# Patient Record
Sex: Female | Born: 1935
Health system: Southern US, Community
[De-identification: ages and names within clinical notes are randomized; demographics above are authoritative.]

## PROBLEM LIST (undated history)

## (undated) DIAGNOSIS — Q6589 Other specified congenital deformities of hip: Secondary | ICD-10-CM

## (undated) DIAGNOSIS — G43909 Migraine, unspecified, not intractable, without status migrainosus: Secondary | ICD-10-CM

## (undated) DIAGNOSIS — I441 Atrioventricular block, second degree: Secondary | ICD-10-CM

## (undated) DIAGNOSIS — D649 Anemia, unspecified: Secondary | ICD-10-CM

## (undated) DIAGNOSIS — M1990 Unspecified osteoarthritis, unspecified site: Secondary | ICD-10-CM

## (undated) DIAGNOSIS — Z8679 Personal history of other diseases of the circulatory system: Secondary | ICD-10-CM

## (undated) DIAGNOSIS — D509 Iron deficiency anemia, unspecified: Secondary | ICD-10-CM

## (undated) DIAGNOSIS — A0472 Enterocolitis due to Clostridium difficile, not specified as recurrent: Secondary | ICD-10-CM

## (undated) DIAGNOSIS — M25551 Pain in right hip: Secondary | ICD-10-CM

## (undated) DIAGNOSIS — H547 Unspecified visual loss: Secondary | ICD-10-CM

## (undated) DIAGNOSIS — I4719 Other supraventricular tachycardia: Secondary | ICD-10-CM

## (undated) DIAGNOSIS — H409 Unspecified glaucoma: Secondary | ICD-10-CM

## (undated) DIAGNOSIS — B028 Zoster with other complications: Secondary | ICD-10-CM

## (undated) DIAGNOSIS — E785 Hyperlipidemia, unspecified: Secondary | ICD-10-CM

## (undated) DIAGNOSIS — H3530 Unspecified macular degeneration: Secondary | ICD-10-CM

## (undated) DIAGNOSIS — Z9289 Personal history of other medical treatment: Secondary | ICD-10-CM

## (undated) DIAGNOSIS — I493 Ventricular premature depolarization: Secondary | ICD-10-CM

## (undated) DIAGNOSIS — I491 Atrial premature depolarization: Secondary | ICD-10-CM

## (undated) DIAGNOSIS — T4145XA Adverse effect of unspecified anesthetic, initial encounter: Secondary | ICD-10-CM

## (undated) DIAGNOSIS — R51 Headache: Secondary | ICD-10-CM

## (undated) DIAGNOSIS — T8859XA Other complications of anesthesia, initial encounter: Secondary | ICD-10-CM

## (undated) HISTORY — PX: COLONOSCOPY: SHX174

## (undated) HISTORY — PX: BELPHAROPTOSIS REPAIR: SHX369

## (undated) HISTORY — DX: Zoster with other complications: B02.8

## (undated) HISTORY — DX: Atrial premature depolarization: I49.1

## (undated) HISTORY — PX: TONSILLECTOMY: SUR1361

## (undated) HISTORY — DX: Atrioventricular block, second degree: I44.1

## (undated) HISTORY — DX: Unspecified visual loss: H54.7

## (undated) HISTORY — DX: Unspecified osteoarthritis, unspecified site: M19.90

## (undated) HISTORY — PX: OTHER SURGICAL HISTORY: SHX169

## (undated) HISTORY — DX: Other supraventricular tachycardia: I47.19

## (undated) HISTORY — DX: Ventricular premature depolarization: I49.3

---

## 1977-08-01 HISTORY — PX: OTHER SURGICAL HISTORY: SHX169

## 1977-08-01 HISTORY — PX: APPENDECTOMY: SHX54

## 1998-04-22 ENCOUNTER — Other Ambulatory Visit: Admission: RE | Admit: 1998-04-22 | Discharge: 1998-04-22 | Payer: Self-pay | Admitting: *Deleted

## 1999-12-24 ENCOUNTER — Other Ambulatory Visit: Admission: RE | Admit: 1999-12-24 | Discharge: 1999-12-24 | Payer: Self-pay | Admitting: *Deleted

## 2008-08-18 LAB — HM PAP SMEAR

## 2009-07-22 ENCOUNTER — Encounter: Admission: RE | Admit: 2009-07-22 | Discharge: 2009-07-22 | Payer: Self-pay | Admitting: Internal Medicine

## 2010-09-18 LAB — HM DEXA SCAN

## 2011-02-08 ENCOUNTER — Other Ambulatory Visit: Payer: Self-pay | Admitting: Internal Medicine

## 2011-02-08 ENCOUNTER — Ambulatory Visit
Admission: RE | Admit: 2011-02-08 | Discharge: 2011-02-08 | Disposition: A | Discharge status: Home / Self Care | Payer: Medicare Other | Source: Ambulatory Visit | Attending: Internal Medicine | Admitting: Internal Medicine

## 2011-02-08 DIAGNOSIS — M25519 Pain in unspecified shoulder: Secondary | ICD-10-CM

## 2012-08-01 DIAGNOSIS — Z9289 Personal history of other medical treatment: Secondary | ICD-10-CM

## 2012-08-01 HISTORY — DX: Personal history of other medical treatment: Z92.89

## 2012-08-03 ENCOUNTER — Other Ambulatory Visit: Payer: Self-pay | Admitting: Orthopedic Surgery

## 2012-08-03 MED ORDER — BUPIVACAINE LIPOSOME 1.3 % IJ SUSP: 20.0000 mL | Freq: Once | INTRAMUSCULAR | Status: DC

## 2012-08-03 MED ORDER — DEXAMETHASONE SODIUM PHOSPHATE 10 MG/ML IJ SOLN: 10.0000 mg | Freq: Once | INTRAMUSCULAR | Status: DC

## 2012-08-03 NOTE — Progress Notes (Signed)
Preoperative surgical orders have been place into the Epic hospital system for [facility] on 08/03/2012, 7:57 AM  by Patrica Duel for surgery on 09/10/2012.  Preop Total Knee orders including Experal, IV Tylenol, and IV Decadron as long as there are no contraindications to the above medications. Avel Peace, PA-C

## 2012-08-29 NOTE — Patient Instructions (Signed)
Meredith Moore  08/29/2012   Your procedure is scheduled on:09/10/12   Report to [facility] at [phone] AM.  Call this number if you have problems the morning of surgery: [phone]   Remember:   Do not eat food or drink liquids after midnight.   Take these medicines the morning of surgery with A SIP OF WATER:    Do not wear jewelry, make-up or nail polish.  Do not wear lotions, powders, or perfumes.   Do not shave 48 hours prior to surgery.   Do not bring valuables to the hospital.  Contacts, dentures or bridgework may not be worn into surgery.  Leave suitcase in the car. After surgery it may be brought to your room.  For patients admitted to the hospital, checkout time is 11:00 AM the day of discharge.    SEE CHG INSTRUCTION SHEET    Please read over the following fact sheets that you were given: MRSA Information, Blood Transfusion Fact Sheet, coughing and deep breathing exercises, Incentive Spirometry Fact Sheet, leg exercises.                Failure to comply with these instructions may result in cancellation of your surgery.               Patient Signature _______________________             Nurse Signature _______________________

## 2012-08-30 ENCOUNTER — Encounter (HOSPITAL_COMMUNITY): Payer: Self-pay

## 2012-08-30 ENCOUNTER — Encounter (HOSPITAL_COMMUNITY)
Admission: RE | Admit: 2012-08-30 | Discharge: 2012-08-30 | Disposition: A | Discharge status: Home / Self Care | Payer: Medicare Other | Source: Ambulatory Visit | Attending: Orthopedic Surgery | Admitting: Orthopedic Surgery

## 2012-08-30 HISTORY — DX: Unspecified glaucoma: H40.9

## 2012-08-30 HISTORY — DX: Unspecified osteoarthritis, unspecified site: M19.90

## 2012-08-30 HISTORY — DX: Unspecified macular degeneration: H35.30

## 2012-08-30 LAB — COMPREHENSIVE METABOLIC PANEL
ALT: 13 U/L (ref 0–35)
AST: 18 U/L (ref 0–37)
Albumin: 3.7 g/dL (ref 3.5–5.2)
CO2: 27 mEq/L (ref 19–32)
Calcium: 9.7 mg/dL (ref 8.4–10.5)
Chloride: 100 mEq/L (ref 96–112)
GFR calc non Af Amer: 63 mL/min — ABNORMAL LOW (ref >90)
Sodium: 136 mEq/L (ref 135–145)
Total Bilirubin: 0.3 mg/dL (ref 0.3–1.2)

## 2012-08-30 LAB — URINALYSIS, ROUTINE W REFLEX MICROSCOPIC
Glucose, UA: NEGATIVE mg/dL
Ketones, ur: NEGATIVE mg/dL
Specific Gravity, Urine: 1.007 (ref 1.005–1.030)
pH: 7 (ref 5.0–8.0)

## 2012-08-30 LAB — CBC
Platelets: 272 10*3/uL (ref 150–400)
RBC: 4.17 MIL/uL (ref 3.87–5.11)
WBC: 6.5 10*3/uL (ref 4.0–10.5)

## 2012-08-30 LAB — SURGICAL PCR SCREEN: Staphylococcus aureus: NEGATIVE

## 2012-08-30 NOTE — Progress Notes (Signed)
Patient to bring own eye drops from home.

## 2012-08-30 NOTE — Progress Notes (Signed)
Urinalysis with micro results faxed to Dr Lequita Halt by [facility] fax.

## 2012-08-30 NOTE — Progress Notes (Signed)
EKG 08/08/12 on chart

## 2012-09-06 NOTE — H&P (Signed)
TOTAL KNEE ADMISSION H&P  Patient is being admitted for right total knee arthroplasty.  Subjective:  Chief Complaint:right knee pain.  HPI: Meredith Moore, 77 y.o. female, has a history of pain and functional disability in the right knee due to arthritis and has failed non-surgical conservative treatments for greater than 12 weeks to includeNSAID's and/or analgesics, corticosteriod injections, flexibility and strengthening excercises and activity modification.  Onset of symptoms was gradual, starting 5 years ago with stable course since that time. The patient noted no past surgery on the right knee(s).  Patient currently rates pain in the right knee(s) at 7 out of 10 with activity. Patient has night pain, worsening of pain with activity and weight bearing, pain that interferes with activities of daily living, pain with passive range of motion, crepitus and joint swelling.  Patient has evidence of subchondral cysts, periarticular osteophytes and joint space narrowing by imaging studies. There is no active infection.  Past Medical History  Diagnosis Date  . Arthritis   . Glaucoma   . Macular degeneration     Past Surgical History  Procedure Date  . Tonsillectomy     1945   . Ovarian tumor      benign tumor removed   . Appendectomy     No prescriptions prior to admission   Allergies  Allergen Reactions  . Formaldehyde   . Nickel     History  Substance Use Topics  . Smoking status: Former Smoker    Quit date: 08/01/1988  . Smokeless tobacco: Never Used  . Alcohol Use: No    Family History Father deceased age 57 due to CAD Mother deceased age 27 due to MI  Review of Systems  Constitutional: Negative.   HENT: Positive for tinnitus. Negative for hearing loss, ear pain, nosebleeds, congestion, sore throat, neck pain and ear discharge.   Eyes: Negative.   Respiratory: Negative for stridor.   Cardiovascular: Negative.   Genitourinary: Negative.   Musculoskeletal: Positive  for myalgias and joint pain. Negative for back pain and falls.       Right knee pain  Skin: Negative.   Neurological: Negative.  Negative for headaches.  Endo/Heme/Allergies: Negative.   Psychiatric/Behavioral: Negative.     Objective:  Physical Exam  Constitutional: She is oriented to person, place, and time. She appears well-developed and well-nourished.  HENT:  Head: Normocephalic and atraumatic.  Right Ear: External ear normal.  Left Ear: External ear normal.  Nose: Nose normal.  Mouth/Throat: Oropharynx is clear and moist.  Eyes: EOM are normal. Right eye exhibits no discharge. Left eye exhibits no discharge.  Neck: Normal range of motion. Neck supple. No tracheal deviation present. No thyromegaly present.  Cardiovascular: Normal rate, regular rhythm, normal heart sounds and intact distal pulses.   No murmur heard. Respiratory: Effort normal and breath sounds normal. No respiratory distress. She exhibits no tenderness.  GI: Soft. Bowel sounds are normal. She exhibits no distension and no mass. There is no tenderness.  Musculoskeletal:       Right hip: Normal.       Left hip: Normal.       Right lower leg: She exhibits no tenderness and no swelling.       Left lower leg: She exhibits no tenderness and no swelling.       Legs: Lymphadenopathy:    She has no cervical adenopathy.  Neurological: She is oriented to person, place, and time. She has normal strength and normal reflexes. No sensory deficit.  Skin: No  rash noted. No erythema.  Psychiatric: She has a normal mood and affect.    Vitals Weight: 138 lb Height: 64 in Body Surface Area: 1.68 m Body Mass Index: 23.69 kg/m BP: 118/68 (Sitting, Left Arm, Standard)  Imaging Review Plain radiographs demonstrate severe degenerative joint disease of the right knee(s). The overall alignment issignificant valgus. The bone quality appears to be fair for age and reported activity level.  Assessment/Plan:  End stage  arthritis, right knee   The patient history, physical examination, clinical judgment of the provider and imaging studies are consistent with end stage degenerative joint disease of the right knee(s) and total knee arthroplasty is deemed medically necessary. The treatment options including medical management, injection therapy arthroscopy and arthroplasty were discussed at length. The risks and benefits of total knee arthroplasty were presented and reviewed. The risks due to aseptic loosening, infection, stiffness, patella tracking problems, thromboembolic complications and other imponderables were discussed. The patient acknowledged the explanation, agreed to proceed with the plan and consent was signed. Patient is being admitted for inpatient treatment for surgery, pain control, PT, OT, prophylactic antibiotics, VTE prophylaxis, progressive ambulation and ADL's and discharge planning. The patient is planning to be discharged to skilled nursing facility     [place], [place]

## 2012-09-10 ENCOUNTER — Encounter (HOSPITAL_COMMUNITY)
Admission: RE | Disposition: A | Discharge status: Skilled Nursing Facility | Payer: Self-pay | Source: Ambulatory Visit | Attending: Orthopedic Surgery

## 2012-09-10 ENCOUNTER — Inpatient Hospital Stay (HOSPITAL_COMMUNITY): Payer: Medicare Other | Admitting: Anesthesiology

## 2012-09-10 ENCOUNTER — Inpatient Hospital Stay (HOSPITAL_COMMUNITY)
Admission: RE | Admit: 2012-09-10 | Discharge: 2012-09-14 | DRG: 470 | Disposition: A | Discharge status: Skilled Nursing Facility | Payer: Medicare Other | Source: Ambulatory Visit | Attending: Orthopedic Surgery | Admitting: Orthopedic Surgery

## 2012-09-10 ENCOUNTER — Encounter (HOSPITAL_COMMUNITY): Payer: Self-pay | Admitting: Anesthesiology

## 2012-09-10 ENCOUNTER — Encounter (HOSPITAL_COMMUNITY): Payer: Self-pay | Admitting: *Deleted

## 2012-09-10 DIAGNOSIS — D62 Acute posthemorrhagic anemia: Secondary | ICD-10-CM | POA: Diagnosis not present

## 2012-09-10 DIAGNOSIS — Z01812 Encounter for preprocedural laboratory examination: Secondary | ICD-10-CM

## 2012-09-10 DIAGNOSIS — M1710 Unilateral primary osteoarthritis, unspecified knee: Secondary | ICD-10-CM | POA: Diagnosis present

## 2012-09-10 HISTORY — PX: TOTAL KNEE ARTHROPLASTY: SHX125

## 2012-09-10 SURGERY — ARTHROPLASTY, KNEE, TOTAL
Anesthesia: Spinal | Site: Knee | Laterality: Right | Wound class: Clean

## 2012-09-10 MED ORDER — DEXAMETHASONE SODIUM PHOSPHATE 10 MG/ML IJ SOLN
INTRAMUSCULAR | Status: DC | PRN
  Administered 2012-09-10: 10 mg via INTRAVENOUS

## 2012-09-10 MED ORDER — ACETAMINOPHEN 10 MG/ML IV SOLN
1000.0000 mg | Freq: Four times a day (QID) | INTRAVENOUS | Status: AC
  Administered 2012-09-10 – 2012-09-11 (×4): 1000 mg via INTRAVENOUS
  Filled 2012-09-10 (×7): qty 100

## 2012-09-10 MED ORDER — ACETAMINOPHEN 10 MG/ML IV SOLN
INTRAVENOUS | Status: DC | PRN
  Administered 2012-09-10: 1000 mg via INTRAVENOUS

## 2012-09-10 MED ORDER — METOCLOPRAMIDE HCL 10 MG PO TABS: 5.0000 mg | ORAL_TABLET | Freq: Three times a day (TID) | ORAL | Status: DC | PRN

## 2012-09-10 MED ORDER — ACETAMINOPHEN 650 MG RE SUPP: 650.0000 mg | Freq: Four times a day (QID) | RECTAL | Status: DC | PRN

## 2012-09-10 MED ORDER — LACTATED RINGERS IV SOLN
INTRAVENOUS | Status: DC
  Administered 2012-09-10 (×3): via INTRAVENOUS

## 2012-09-10 MED ORDER — ACETAMINOPHEN 325 MG PO TABS: 650.0000 mg | ORAL_TABLET | Freq: Four times a day (QID) | ORAL | Status: DC | PRN

## 2012-09-10 MED ORDER — PROPOFOL 10 MG/ML IV EMUL
INTRAVENOUS | Status: DC | PRN
  Administered 2012-09-10: 100 ug/kg/min via INTRAVENOUS

## 2012-09-10 MED ORDER — DEXTROSE-NACL 5-0.9 % IV SOLN
INTRAVENOUS | Status: DC
  Administered 2012-09-10 – 2012-09-12 (×3): via INTRAVENOUS

## 2012-09-10 MED ORDER — BUPIVACAINE ON-Q PAIN PUMP (FOR ORDER SET NO CHG)
INJECTION | Status: DC
  Filled 2012-09-10: qty 1

## 2012-09-10 MED ORDER — ONDANSETRON HCL 4 MG PO TABS: 4.0000 mg | ORAL_TABLET | Freq: Four times a day (QID) | ORAL | Status: DC | PRN

## 2012-09-10 MED ORDER — TIMOLOL MALEATE 0.5 % OP SOLN
1.0000 [drp] | Freq: Two times a day (BID) | OPHTHALMIC | Status: DC
  Administered 2012-09-10 – 2012-09-14 (×8): 1 [drp] via OPHTHALMIC
  Filled 2012-09-10: qty 5

## 2012-09-10 MED ORDER — MEPERIDINE HCL 50 MG/ML IJ SOLN: 6.2500 mg | INTRAMUSCULAR | Status: DC | PRN

## 2012-09-10 MED ORDER — KETOROLAC TROMETHAMINE 0.5 % OP SOLN
1.0000 [drp] | Freq: Three times a day (TID) | OPHTHALMIC | Status: DC
  Administered 2012-09-10 – 2012-09-14 (×11): 1 [drp] via OPHTHALMIC
  Filled 2012-09-10: qty 3

## 2012-09-10 MED ORDER — METOCLOPRAMIDE HCL 5 MG/ML IJ SOLN: 5.0000 mg | Freq: Three times a day (TID) | INTRAMUSCULAR | Status: DC | PRN

## 2012-09-10 MED ORDER — LACTATED RINGERS IV SOLN: INTRAVENOUS | Status: DC

## 2012-09-10 MED ORDER — PROMETHAZINE HCL 25 MG/ML IJ SOLN: 6.2500 mg | INTRAMUSCULAR | Status: DC | PRN

## 2012-09-10 MED ORDER — OXYCODONE HCL 5 MG PO TABS
5.0000 mg | ORAL_TABLET | ORAL | Status: DC | PRN
  Administered 2012-09-10: 5 mg via ORAL
  Administered 2012-09-10: 10 mg via ORAL
  Administered 2012-09-10: 5 mg via ORAL
  Administered 2012-09-11 (×3): 10 mg via ORAL
  Administered 2012-09-12: 5 mg via ORAL
  Administered 2012-09-12: 10 mg via ORAL
  Administered 2012-09-12 – 2012-09-13 (×3): 5 mg via ORAL
  Administered 2012-09-13 – 2012-09-14 (×2): 10 mg via ORAL
  Filled 2012-09-10 (×2): qty 1
  Filled 2012-09-10: qty 2
  Filled 2012-09-10: qty 1
  Filled 2012-09-10: qty 2
  Filled 2012-09-10: qty 1
  Filled 2012-09-10: qty 2
  Filled 2012-09-10: qty 1
  Filled 2012-09-10 (×4): qty 2
  Filled 2012-09-10: qty 1

## 2012-09-10 MED ORDER — ATORVASTATIN CALCIUM 10 MG PO TABS
5.0000 mg | ORAL_TABLET | Freq: Every day | ORAL | Status: DC
  Administered 2012-09-10 – 2012-09-14 (×5): 5 mg via ORAL
  Filled 2012-09-10 (×6): qty 0.5

## 2012-09-10 MED ORDER — DEXAMETHASONE 6 MG PO TABS
10.0000 mg | ORAL_TABLET | Freq: Once | ORAL | Status: AC
  Administered 2012-09-11: 10 mg via ORAL
  Filled 2012-09-10: qty 1

## 2012-09-10 MED ORDER — SODIUM CHLORIDE 0.9 % IR SOLN
Status: DC | PRN
  Administered 2012-09-10: 1000 mL

## 2012-09-10 MED ORDER — ACETAMINOPHEN 10 MG/ML IV SOLN: 1000.0000 mg | Freq: Once | INTRAVENOUS | Status: DC

## 2012-09-10 MED ORDER — CHLORHEXIDINE GLUCONATE 4 % EX LIQD
60.0000 mL | Freq: Once | CUTANEOUS | Status: DC
  Filled 2012-09-10: qty 60

## 2012-09-10 MED ORDER — PHENYLEPHRINE HCL 10 MG/ML IJ SOLN
10.0000 mg | INTRAVENOUS | Status: DC | PRN
  Administered 2012-09-10: 20 ug/min via INTRAVENOUS

## 2012-09-10 MED ORDER — MENTHOL 3 MG MT LOZG
1.0000 | LOZENGE | OROMUCOSAL | Status: DC | PRN
  Filled 2012-09-10: qty 9

## 2012-09-10 MED ORDER — ONDANSETRON HCL 4 MG/2ML IJ SOLN
4.0000 mg | Freq: Four times a day (QID) | INTRAMUSCULAR | Status: DC | PRN
  Administered 2012-09-11: 4 mg via INTRAVENOUS
  Filled 2012-09-10: qty 2

## 2012-09-10 MED ORDER — BUPIVACAINE LIPOSOME 1.3 % IJ SUSP
20.0000 mL | Freq: Once | INTRAMUSCULAR | Status: AC
  Administered 2012-09-10: 20 mL
  Filled 2012-09-10: qty 20

## 2012-09-10 MED ORDER — LATANOPROST 0.005 % OP SOLN
1.0000 [drp] | Freq: Every day | OPHTHALMIC | Status: DC
  Administered 2012-09-10 – 2012-09-13 (×4): 1 [drp] via OPHTHALMIC
  Filled 2012-09-10 (×2): qty 2.5

## 2012-09-10 MED ORDER — BUPIVACAINE IN DEXTROSE 0.75-8.25 % IT SOLN
INTRATHECAL | Status: DC | PRN
  Administered 2012-09-10: 1.7 mL via INTRATHECAL

## 2012-09-10 MED ORDER — METHOCARBAMOL 100 MG/ML IJ SOLN
500.0000 mg | Freq: Four times a day (QID) | INTRAMUSCULAR | Status: DC | PRN
  Administered 2012-09-10: 500 mg via INTRAVENOUS
  Filled 2012-09-10: qty 5

## 2012-09-10 MED ORDER — MIDAZOLAM HCL 5 MG/5ML IJ SOLN
INTRAMUSCULAR | Status: DC | PRN
  Administered 2012-09-10 (×2): 1 mg via INTRAVENOUS

## 2012-09-10 MED ORDER — METHOCARBAMOL 500 MG PO TABS
500.0000 mg | ORAL_TABLET | Freq: Four times a day (QID) | ORAL | Status: DC | PRN
  Administered 2012-09-10 – 2012-09-13 (×4): 500 mg via ORAL
  Filled 2012-09-10 (×4): qty 1

## 2012-09-10 MED ORDER — 0.9 % SODIUM CHLORIDE (POUR BTL) OPTIME
TOPICAL | Status: DC | PRN
  Administered 2012-09-10: 1000 mL

## 2012-09-10 MED ORDER — POLYETHYLENE GLYCOL 3350 17 G PO PACK
17.0000 g | PACK | Freq: Every day | ORAL | Status: DC | PRN
  Administered 2012-09-12: 17 g via ORAL

## 2012-09-10 MED ORDER — SODIUM CHLORIDE 0.9 % IV SOLN: INTRAVENOUS | Status: DC

## 2012-09-10 MED ORDER — HYDROMORPHONE HCL PF 1 MG/ML IJ SOLN: 0.2500 mg | INTRAMUSCULAR | Status: DC | PRN

## 2012-09-10 MED ORDER — TRAMADOL HCL 50 MG PO TABS: 50.0000 mg | ORAL_TABLET | Freq: Four times a day (QID) | ORAL | Status: DC | PRN

## 2012-09-10 MED ORDER — PHENYLEPHRINE HCL 10 MG/ML IJ SOLN
INTRAMUSCULAR | Status: DC | PRN
  Administered 2012-09-10: 80 ug via INTRAVENOUS
  Administered 2012-09-10: 40 ug via INTRAVENOUS

## 2012-09-10 MED ORDER — DEXAMETHASONE SODIUM PHOSPHATE 10 MG/ML IJ SOLN: 10.0000 mg | Freq: Once | INTRAMUSCULAR | Status: AC

## 2012-09-10 MED ORDER — BISACODYL 10 MG RE SUPP: 10.0000 mg | Freq: Every day | RECTAL | Status: DC | PRN

## 2012-09-10 MED ORDER — PHENOL 1.4 % MT LIQD
1.0000 | OROMUCOSAL | Status: DC | PRN
  Filled 2012-09-10: qty 177

## 2012-09-10 MED ORDER — DIPHENHYDRAMINE HCL 12.5 MG/5ML PO ELIX: 12.5000 mg | ORAL_SOLUTION | ORAL | Status: DC | PRN

## 2012-09-10 MED ORDER — RIVAROXABAN 10 MG PO TABS
10.0000 mg | ORAL_TABLET | Freq: Every day | ORAL | Status: DC
  Administered 2012-09-11 – 2012-09-14 (×4): 10 mg via ORAL
  Filled 2012-09-10 (×6): qty 1

## 2012-09-10 MED ORDER — CEFAZOLIN SODIUM 1-5 GM-% IV SOLN
1.0000 g | Freq: Four times a day (QID) | INTRAVENOUS | Status: AC
  Administered 2012-09-10 (×2): 1 g via INTRAVENOUS
  Filled 2012-09-10 (×2): qty 50

## 2012-09-10 MED ORDER — FLEET ENEMA 7-19 GM/118ML RE ENEM: 1.0000 | ENEMA | Freq: Once | RECTAL | Status: AC | PRN

## 2012-09-10 MED ORDER — PROPOFOL 10 MG/ML IV BOLUS
INTRAVENOUS | Status: DC | PRN
  Administered 2012-09-10: 50 mg via INTRAVENOUS

## 2012-09-10 MED ORDER — SODIUM CHLORIDE 0.9 % IJ SOLN
INTRAMUSCULAR | Status: DC | PRN
  Administered 2012-09-10: 50 mL

## 2012-09-10 MED ORDER — MORPHINE SULFATE 2 MG/ML IJ SOLN
1.0000 mg | INTRAMUSCULAR | Status: DC | PRN
  Filled 2012-09-10: qty 1

## 2012-09-10 MED ORDER — METOCLOPRAMIDE HCL 5 MG/ML IJ SOLN
INTRAMUSCULAR | Status: DC | PRN
  Administered 2012-09-10: 10 mg via INTRAVENOUS

## 2012-09-10 MED ORDER — CEFAZOLIN SODIUM-DEXTROSE 2-3 GM-% IV SOLR
2.0000 g | INTRAVENOUS | Status: AC
  Administered 2012-09-10: 2 g via INTRAVENOUS

## 2012-09-10 MED ORDER — DOCUSATE SODIUM 100 MG PO CAPS
100.0000 mg | ORAL_CAPSULE | Freq: Two times a day (BID) | ORAL | Status: DC
  Administered 2012-09-10 – 2012-09-14 (×9): 100 mg via ORAL

## 2012-09-10 MED ORDER — ONDANSETRON HCL 4 MG/2ML IJ SOLN
INTRAMUSCULAR | Status: DC | PRN
  Administered 2012-09-10 (×2): 2 mg via INTRAVENOUS

## 2012-09-10 SURGICAL SUPPLY — 53 items
BAG ZIPLOCK 12X15 (MISCELLANEOUS) ×2 IMPLANT
BANDAGE ELASTIC 6 VELCRO ST LF (GAUZE/BANDAGES/DRESSINGS) ×2 IMPLANT
BANDAGE ESMARK 6X9 LF (GAUZE/BANDAGES/DRESSINGS) ×1 IMPLANT
BLADE SAG 18X100X1.27 (BLADE) ×2 IMPLANT
BLADE SAW SGTL 11.0X1.19X90.0M (BLADE) ×2 IMPLANT
BNDG CMPR 9X6 STRL LF SNTH (GAUZE/BANDAGES/DRESSINGS) ×1
BNDG ESMARK 6X9 LF (GAUZE/BANDAGES/DRESSINGS) ×2
BOWL SMART MIX CTS (DISPOSABLE) ×2 IMPLANT
CEMENT HV SMART SET (Cement) ×4 IMPLANT
CLOTH BEACON ORANGE TIMEOUT ST (SAFETY) ×2 IMPLANT
CUFF TOURN SGL QUICK 34 (TOURNIQUET CUFF) ×2
CUFF TRNQT CYL 34X4X40X1 (TOURNIQUET CUFF) ×1 IMPLANT
DECANTER SPIKE VIAL GLASS SM (MISCELLANEOUS) ×2 IMPLANT
DRAPE EXTREMITY T 121X128X90 (DRAPE) ×2 IMPLANT
DRAPE POUCH INSTRU U-SHP 10X18 (DRAPES) ×2 IMPLANT
DRAPE U-SHAPE 47X51 STRL (DRAPES) ×2 IMPLANT
DRSG ADAPTIC 3X8 NADH LF (GAUZE/BANDAGES/DRESSINGS) ×2 IMPLANT
DRSG PAD ABDOMINAL 8X10 ST (GAUZE/BANDAGES/DRESSINGS) ×2 IMPLANT
DURAPREP 26ML APPLICATOR (WOUND CARE) ×2 IMPLANT
ELECT REM PT RETURN 9FT ADLT (ELECTROSURGICAL) ×2
ELECTRODE REM PT RTRN 9FT ADLT (ELECTROSURGICAL) ×1 IMPLANT
EVACUATOR 1/8 PVC DRAIN (DRAIN) ×2 IMPLANT
FACESHIELD LNG OPTICON STERILE (SAFETY) ×10 IMPLANT
GLOVE BIO SURGEON STRL SZ8 (GLOVE) ×2 IMPLANT
GLOVE BIOGEL PI IND STRL 8 (GLOVE) ×1 IMPLANT
GLOVE BIOGEL PI INDICATOR 8 (GLOVE) ×1
GLOVE SURG SS PI 6.5 STRL IVOR (GLOVE) ×4 IMPLANT
GOWN STRL NON-REIN LRG LVL3 (GOWN DISPOSABLE) ×6 IMPLANT
GOWN STRL REIN XL XLG (GOWN DISPOSABLE) ×2 IMPLANT
HANDPIECE INTERPULSE COAX TIP (DISPOSABLE) ×1
IMMOBILIZER KNEE 20 (SOFTGOODS) ×2
IMMOBILIZER KNEE 20 THIGH 36 (SOFTGOODS) ×1 IMPLANT
KIT BASIN OR (CUSTOM PROCEDURE TRAY) ×2 IMPLANT
MANIFOLD NEPTUNE II (INSTRUMENTS) ×2 IMPLANT
NDL SAFETY ECLIPSE 18X1.5 (NEEDLE) ×1 IMPLANT
NEEDLE HYPO 18GX1.5 SHARP (NEEDLE) ×1
NS IRRIG 1000ML POUR BTL (IV SOLUTION) ×2 IMPLANT
PACK TOTAL JOINT (CUSTOM PROCEDURE TRAY) ×2 IMPLANT
PADDING CAST COTTON 6X4 STRL (CAST SUPPLIES) ×6 IMPLANT
POSITIONER SURGICAL ARM (MISCELLANEOUS) ×2 IMPLANT
SET HNDPC FAN SPRY TIP SCT (DISPOSABLE) ×1 IMPLANT
SPONGE GAUZE 4X4 12PLY (GAUZE/BANDAGES/DRESSINGS) ×2 IMPLANT
STRIP CLOSURE SKIN 1/2X4 (GAUZE/BANDAGES/DRESSINGS) ×4 IMPLANT
SUCTION FRAZIER 12FR DISP (SUCTIONS) ×2 IMPLANT
SUT MNCRL AB 4-0 PS2 18 (SUTURE) ×2 IMPLANT
SUT VIC AB 2-0 CT1 27 (SUTURE) ×6
SUT VIC AB 2-0 CT1 TAPERPNT 27 (SUTURE) ×3 IMPLANT
SUT VLOC 180 0 24IN GS25 (SUTURE) ×2 IMPLANT
SYR 50ML LL SCALE MARK (SYRINGE) ×2 IMPLANT
TOWEL OR 17X26 10 PK STRL BLUE (TOWEL DISPOSABLE) ×4 IMPLANT
TRAY FOLEY CATH 14FRSI W/METER (CATHETERS) ×2 IMPLANT
WATER STERILE IRR 1500ML POUR (IV SOLUTION) ×4 IMPLANT
WRAP KNEE MAXI GEL POST OP (GAUZE/BANDAGES/DRESSINGS) ×2 IMPLANT

## 2012-09-10 NOTE — Op Note (Signed)
Pre-operative diagnosis- Osteoarthritis  Right knee(s)  Post-operative diagnosis- Osteoarthritis Right knee(s)  Procedure-  Right  Total Knee Arthroplasty  Surgeon- Meredith Rankin. Zahi Plaskett, MD  Assistant- Renee Harder, PA-C   Anesthesia-  Spinal EBL-* No blood loss amount entered *  Drains Hemovac  Tourniquet time-  Total Tourniquet Time Documented: Thigh (Right) - 55 minutes Total: Thigh (Right) - 55 minutes    Complications- None  Condition-PACU - hemodynamically stable.   Brief Clinical Note  Meredith Moore is a 77 y.o. year old female with end stage OA of her right knee with progressively worsening pain and dysfunction. She has constant pain, with activity and at rest and significant functional deficits with difficulties even with ADLs. She has had extensive non-op management including analgesics, injections of cortisone and home exercise program, but remains in significant pain with significant dysfunction.Radiographs show bone on bone arthritis lateral and patellofemoral. She presents now for right Total Knee Arthroplasty. She has a nickel allergy and we thus are utilizing the [employer] oxinium knee   Procedure in detail---   The patient is brought into the operating room and positioned supine on the operating table. After successful administration of  Spinal,   a tourniquet is placed high on the  Right thigh(s) and the lower extremity is prepped and draped in the usual sterile fashion. Time out is performed by the operating team and then the  Right lower extremity is wrapped in Esmarch, knee flexed and the tourniquet inflated to 300 mmHg.       A midline incision is made with a ten blade through the subcutaneous tissue to the level of the extensor mechanism. A fresh blade is used to make a medial parapatellar arthrotomy. Soft tissue over the proximal medial tibia is subperiosteally elevated to the joint line with a knife and into the semimembranosus bursa with a Cobb elevator.  Soft tissue over the proximal lateral tibia is elevated with attention being paid to avoiding the patellar tendon on the tibial tubercle. The patella is everted, knee flexed 90 degrees and the ACL and PCL are removed. Findings are bone on bone lateral and patellofemoral with large global osteophytes.        The drill is used to create a starting hole in the distal femur and the canal is thoroughly irrigated with sterile saline to remove the fatty contents. The 5 degree Right  valgus alignment guide is placed into the femoral canal and the distal femoral cutting block is pinned to remove 9.5 mm off the distal femur. Resection is made with an oscillating saw.      The tibia is subluxed forward and the menisci are removed. The extramedullary alignment guide is placed referencing proximally at the medial aspect of the tibial tubercle and distally along the second metatarsal axis and tibial crest. The block is pinned to remove 2mm off the more deficient lateral  side. Resection is made with an oscillating saw. Size 3 is the most appropriate size for the tibia and the proximal tibia is prepared with the modular drill and keel punch for that size.      The femoral sizing guide is placed and size 3 is most appropriate. Rotation is marked off the epicondylar axis and confirmed by creating a rectangular flexion gap at 90 degrees. The size 3 cutting block is pinned in this rotation and the anterior, posterior and chamfer cuts are made with the oscillating saw. The intercondylar block is then placed and the machining is done for  that.      Trial size 3 tibial component, trial size 3 posterior stabilized femur and a 15  mm posterior stabilized insert trial is placed. Full extension is achieved with excellent varus/valgus and anterior/posterior balance throughout full range of motion. The patella is everted and thickness measured to be 22  mm. Free hand resection is taken to 13 mm, a 32 template is placed, lug holes are  drilled, trial patella is placed, and it tracks normally. Osteophytes are removed off the posterior femur with the trial in place. All trials are removed and the cut bone surfaces prepared with pulsatile lavage. Cement is mixed and once ready for implantation, the size 3 tibial implant, size  3 posterior stabilized femoral component, and the size 32 patella are cemented in place and the patella is held with the clamp. The trial insert is placed and the knee held in full extension. The Exparel (20 ml mixed with 50 ml saline) is injected into the extensor mechanism, posterior capsule, medial and lateral gutters and subcutaneous tissues.  All extruded cement is removed and once the cement is hard the permanent 15 mm posterior stabilized insert is placed into the tibial tray.      The wound is copiously irrigated with saline solution and the extensor mechanism closed over a hemovac drain with #1 PDS suture. The tourniquet is released for a total tourniquet time of 55  minutes. Flexion against gravity is 140 degrees and the patella tracks normally. Subcutaneous tissue is closed with 2.0 vicryl and subcuticular with running 4.0 Monocryl. The incision is cleaned and dried and steri-strips and a bulky sterile dressing are applied. The limb is placed into a knee immobilizer and the patient is awakened and transported to recovery in stable condition.      Please note that a surgical assistant was a medical necessity for this procedure in order to perform it in a safe and expeditious manner. Surgical assistant was necessary to retract the ligaments and vital neurovascular structures to prevent injury to them and also necessary for proper positioning of the limb to allow for anatomic placement of the prosthesis.   Meredith Rankin Chelan Heringer, MD    09/10/2012, 10:45 AM

## 2012-09-10 NOTE — Preoperative (Addendum)
Beta Blockers   Reason not to administer Beta Blockers:Not Applicable, not on home BB 

## 2012-09-10 NOTE — Progress Notes (Addendum)
Clinical Social Work Department BRIEF PSYCHOSOCIAL ASSESSMENT 09/10/2012  Patient:  Meredith Moore, Meredith Moore     Account Number:  [id number]     Admit date:  09/10/2012  Clinical Social Worker:  Jacelyn Grip  Date/Time:  09/10/2012 03:14 PM  Referred by:  Physician  Date Referred:  09/10/2012 Referred for  SNF Placement   Other Referral:   Interview type:  Patient Other interview type:    PSYCHOSOCIAL DATA Living Status:  FAMILY Admitted from facility:   Level of care:   Primary support name:  Charles Meineke/spouse Primary support relationship to patient:  SPOUSE Degree of support available:   adequate    CURRENT CONCERNS Current Concerns  Post-Acute Placement   Other Concerns:    SOCIAL WORK ASSESSMENT / PLAN CSW received referral for New SNF.    CSW met with pt at beside re: SNF placement.    Pt was aware of referral and is interested in [facility] for rehab placment.    CSW completed FL2 and placed on chart, submitted and received pasarr, and faxed clinicals to [facility].    CSW to follow up with pt in regard to bed availability at [facility].    CSW to facilitate pt discharge needs when pt medically ready for discharge.   Assessment/plan status:  Psychosocial Support/Ongoing Assessment of Needs Other assessment/ plan:   discharge planning   Information/referral to community resources:   Referral to [facility]    PATIENT'S/FAMILY'S RESPONSE TO PLAN OF CARE: Pt alert and oriented x 4. Pt visited [facility] prior to admission and is eager to go to rehab at [facility]. Pt appreciative of CSW visit and assistance.     Jacklynn Lewis, MSW, LCSWA (coverage) Clinical Social Work

## 2012-09-10 NOTE — Transfer of Care (Signed)
Immediate Anesthesia Transfer of Care Note  Patient: Meredith Moore  Procedure(s) Performed: Procedure(s): RIGHT TOTAL KNEE ARTHROPLASTY (Right)  Patient Location: PACU  Anesthesia Type:Regional  Level of Consciousness: awake, alert , oriented and patient cooperative  Airway & Oxygen Therapy: Patient Spontanous Breathing and Patient connected to face mask oxygen  Post-op Assessment: Report given to PACU RN and Post -op Vital signs reviewed and stable  Post vital signs: Reviewed and stable  Complications: No apparent anesthesia complications

## 2012-09-10 NOTE — Anesthesia Procedure Notes (Signed)
Spinal Patient location during procedure: OR Staffing Anesthesiologist: Hercules Hasler Performed by: anesthesiologist  Preanesthetic Checklist Completed: patient identified, site marked, surgical consent, pre-op evaluation, timeout performed, IV checked, risks and benefits discussed and monitors and equipment checked Spinal Block Patient position: sitting Prep: Betadine Patient monitoring: heart rate, continuous pulse ox and blood pressure Approach: right paramedian Location: L4-5 Injection technique: single-shot Needle Needle type: Spinocan  Needle gauge: 22 G Needle length: 9 cm Additional Notes Expiration date of kit checked and confirmed. Patient tolerated procedure well, without complications.     

## 2012-09-10 NOTE — Anesthesia Preprocedure Evaluation (Addendum)
Anesthesia Evaluation  Patient identified by MRN, date of birth, ID band Patient awake    Reviewed: Allergy & Precautions, H&P , NPO status , Patient's Chart, lab work & pertinent test results  Airway Mallampati: II TM Distance: >3 FB Neck ROM: Full    Dental no notable dental hx.    Pulmonary neg pulmonary ROS,  breath sounds clear to auscultation  Pulmonary exam normal       Cardiovascular negative cardio ROS  Rhythm:Regular Rate:Normal     Neuro/Psych negative neurological ROS  negative psych ROS   GI/Hepatic negative GI ROS, Neg liver ROS,   Endo/Other  negative endocrine ROS  Renal/GU negative Renal ROS  negative genitourinary   Musculoskeletal negative musculoskeletal ROS (+)   Abdominal   Peds negative pediatric ROS (+)  Hematology negative hematology ROS (+)   Anesthesia Other Findings   Reproductive/Obstetrics negative OB ROS                           Anesthesia Physical Anesthesia Plan  ASA: II  Anesthesia Plan: Spinal   Post-op Pain Management:    Induction:   Airway Management Planned: Simple Face Mask  Additional Equipment:   Intra-op Plan:   Post-operative Plan:   Informed Consent: I have reviewed the patients History and Physical, chart, labs and discussed the procedure including the risks, benefits and alternatives for the proposed anesthesia with the patient or authorized representative who has indicated his/her understanding and acceptance.   Dental advisory given  Plan Discussed with: CRNA  Anesthesia Plan Comments:         Anesthesia Quick Evaluation  

## 2012-09-10 NOTE — Progress Notes (Signed)
Utilization review completed.  

## 2012-09-10 NOTE — Evaluation (Signed)
Physical Therapy Evaluation Patient Details Name: Meredith Moore MRN: [id number] DOB: 31-Oct-1935 Today's Date: 09/10/2012 Time: 1914-7829 PT Time Calculation (min): 16 min  PT Assessment / Plan / Recommendation Clinical Impression  Pt s/p R TKR and agreeable to mobility POD #0.  Pt would benefit from acute PT services in order to improve independence with transfers and ambulation by improving R LE strength and ROM prior to d/c to next venue.    PT Assessment  Patient needs continued PT services    Follow Up Recommendations  SNF    Does the patient have the potential to tolerate intense rehabilitation      Barriers to Discharge        Equipment Recommendations  Rolling walker with 5" wheels    Recommendations for Other Services     Frequency 7X/week    Precautions / Restrictions Precautions Precautions: Knee Required Braces or Orthoses: Knee Immobilizer - Right Knee Immobilizer - Right: Discontinue once straight leg raise with < 10 degree lag Restrictions Weight Bearing Restrictions: No Other Position/Activity Restrictions: R LE WBAT   Pertinent Vitals/Pain Minimal R knee pain, ice packs applied in recliner      Mobility  Bed Mobility Bed Mobility: Supine to Sit Supine to Sit: 4: Min assist Details for Bed Mobility Assistance: verbal cues for technique, assist for R LE support Transfers Transfers: Stand to Sit;Sit to Stand Sit to Stand: 4: Min guard;From elevated surface;With upper extremity assist;From bed Stand to Sit: 4: Min guard;With upper extremity assist;To chair/3-in-1 Details for Transfer Assistance: verbal cues for safe technique Ambulation/Gait Ambulation/Gait Assistance: 4: Min guard Ambulation Distance (Feet): 40 Feet Assistive device: Rolling walker Ambulation/Gait Assistance Details: pt reported her knee felt better than prior to surgery, verbal cues for technique, sequence, flat foot contact vs forefoot, RW distance Gait Pattern: Step-to  pattern;Antalgic Gait velocity: decreased    Exercises     PT Diagnosis: Acute pain;Difficulty walking  PT Problem List: Decreased strength;Decreased activity tolerance;Decreased mobility;Decreased range of motion;Decreased knowledge of use of DME;Decreased knowledge of precautions;Pain PT Treatment Interventions: DME instruction;Gait training;Functional mobility training;Therapeutic activities;Therapeutic exercise;Patient/family education   PT Goals Acute Rehab PT Goals PT Goal Formulation: With patient Time For Goal Achievement: 09/17/12 Potential to Achieve Goals: Good Pt will go Supine/Side to Sit: with modified independence PT Goal: Supine/Side to Sit - Progress: Goal set today Pt will go Sit to Supine/Side: with modified independence PT Goal: Sit to Supine/Side - Progress: Goal set today Pt will go Sit to Stand: with modified independence PT Goal: Sit to Stand - Progress: Goal set today Pt will go Stand to Sit: with modified independence PT Goal: Stand to Sit - Progress: Goal set today Pt will Ambulate: 51 - 150 feet;with modified independence;with least restrictive assistive device PT Goal: Ambulate - Progress: Goal set today Pt will Perform Home Exercise Program: with supervision, verbal cues required/provided PT Goal: Perform Home Exercise Program - Progress: Goal set today  Visit Information  Last PT Received On: 09/10/12 Assistance Needed: +1    Subjective Data  Subjective: It feels better than it did before surgery! Patient Stated Goal: SNF for rehab   Prior Functioning  Home Living Lives With: Spouse Home Access: Stairs to enter Home Adaptive Equipment: None Prior Function Level of Independence: Independent Communication Communication: No difficulties    Cognition  Cognition Overall Cognitive Status: Appears within functional limits for tasks assessed/performed Arousal/Alertness: Awake/alert Orientation Level: Appears intact for tasks assessed Behavior  During Session: [facility] for tasks performed  Extremity/Trunk Assessment Right Lower Extremity Assessment RLE ROM/Strength/Tone: Deficits RLE ROM/Strength/Tone Deficits: pt with good quad contraction and able to perform SLR however still used KI for safety with 1st time OOB Left Lower Extremity Assessment LLE ROM/Strength/Tone: [facility] for tasks assessed   Balance    End of Session PT - End of Session Equipment Utilized During Treatment: Right knee immobilizer Activity Tolerance: Patient tolerated treatment well Patient left: in chair;with call bell/phone within reach CPM Right Knee CPM Right Knee: Off  GP     Randilyn Foisy,KATHrine E 09/10/2012, 4:14 PM Zenovia Jarred, PT, DPT 09/10/2012 Pager: [phone]

## 2012-09-10 NOTE — Anesthesia Postprocedure Evaluation (Signed)
  Anesthesia Post-op Note  Patient: Meredith Moore  Procedure(s) Performed: Procedure(s) (LRB): RIGHT TOTAL KNEE ARTHROPLASTY (Right)  Patient Location: PACU  Anesthesia Type: Spinal  Level of Consciousness: awake and alert   Airway and Oxygen Therapy: Patient Spontanous Breathing  Post-op Pain: mild  Post-op Assessment: Post-op Vital signs reviewed, Patient's Cardiovascular Status Stable, Respiratory Function Stable, Patent Airway and No signs of Nausea or vomiting  Last Vitals:  Filed Vitals:   09/10/12 1310  BP: 113/74  Pulse: 73  Temp: 36.4 C  Resp: 16    Post-op Vital Signs: stable   Complications: No apparent anesthesia complications

## 2012-09-10 NOTE — Progress Notes (Signed)
Clinical Social Work Department CLINICAL SOCIAL WORK PLACEMENT NOTE 09/10/2012  Patient:  Meredith Moore, Meredith Moore  Account Number:  [id number] Admit date:  09/10/2012  Clinical Social Worker:  Jacelyn Grip  Date/time:  09/10/2012 03:21 PM  Clinical Social Work is seeking post-discharge placement for this patient at the following level of care:   SKILLED NURSING   (*CSW will update this form in Epic as items are completed)   09/10/2012  Patient/family provided with [facility] Department of Clinical Social Work's list of facilities offering this level of care within the geographic area requested by the patient (or if unable, by the patient's family).  09/10/2012  Patient/family informed of their freedom to choose among providers that offer the needed level of care, that participate in Medicare, Medicaid or managed care program needed by the patient, have an available bed and are willing to accept the patient.  09/10/2012  Patient/family informed of [facility]' ownership interest in [facility], as well as of the fact that they are under no obligation to receive care at this facility.  PASARR submitted to EDS on 09/10/2012 PASARR number received from EDS on 09/10/2012  FL2 transmitted to all facilities in geographic area requested by pt/family on  09/10/2012 FL2 transmitted to all facilities within larger geographic area on   Patient informed that his/her managed care company has contracts with or will negotiate with  certain facilities, including the following:     Patient/family informed of bed offers received:   Patient chooses bed at  Physician recommends and patient chooses bed at    Patient to be transferred to  on   Patient to be transferred to facility by   The following physician request were entered in Epic:   Additional Comments: Pt interested in [facility].  Jacklynn Lewis, MSW, LCSWA (coverage) Clinical Social Work

## 2012-09-10 NOTE — Plan of Care (Signed)
Problem: Consults Goal: Diagnosis- Total Joint Replacement Primary Total Knee     

## 2012-09-10 NOTE — Interval H&P Note (Signed)
History and Physical Interval Note:  09/10/2012 8:20 AM  Meredith Moore  has presented today for surgery, with the diagnosis of OA OF THE RIGHT KNEE  The various methods of treatment have been discussed with the patient and family. After consideration of risks, benefits and other options for treatment, the patient has consented to  Procedure(s): TOTAL KNEE ARTHROPLASTY (Right) as a surgical intervention .  The patient's history has been reviewed, patient examined, no change in status, stable for surgery.  I have reviewed the patient's chart and labs.  Questions were answered to the patient's satisfaction.     Loanne Drilling

## 2012-09-11 ENCOUNTER — Encounter (HOSPITAL_COMMUNITY): Payer: Self-pay | Admitting: Orthopedic Surgery

## 2012-09-11 DIAGNOSIS — D62 Acute posthemorrhagic anemia: Secondary | ICD-10-CM | POA: Diagnosis not present

## 2012-09-11 LAB — BASIC METABOLIC PANEL
BUN: 10 mg/dL (ref 6–23)
CO2: 24 mEq/L (ref 19–32)
Calcium: 8 mg/dL — ABNORMAL LOW (ref 8.4–10.5)
Glucose, Bld: 150 mg/dL — ABNORMAL HIGH (ref 70–99)
Sodium: 136 mEq/L (ref 135–145)

## 2012-09-11 LAB — CBC
HCT: 24.2 % — ABNORMAL LOW (ref 36.0–46.0)
Hemoglobin: 8.1 g/dL — ABNORMAL LOW (ref 12.0–15.0)
MCH: 30.6 pg (ref 26.0–34.0)
RBC: 2.65 MIL/uL — ABNORMAL LOW (ref 3.87–5.11)

## 2012-09-11 NOTE — Progress Notes (Signed)
Physical Therapy Treatment Patient Details Name: Meredith Moore MRN: [id number] DOB: [birth year] Today's Date: 09/11/2012 Time: 4098-1191 PT Time Calculation (min): 26 min  PT Assessment / Plan / Recommendation Comments on Treatment Session  Pt ambulated in hallway and performed exercises in recliner.  Pt also assisted to bathroom to void.  Pt progressing well with therapy.    Follow Up Recommendations  SNF     Does the patient have the potential to tolerate intense rehabilitation     Barriers to Discharge        Equipment Recommendations  Rolling walker with 5" wheels    Recommendations for Other Services    Frequency     Plan Discharge plan remains appropriate;Frequency remains appropriate    Precautions / Restrictions Precautions Precautions: Knee Precaution Comments: Pt able to perform SLR without knee lag Required Braces or Orthoses: Knee Immobilizer - Right Knee Immobilizer - Right: Discontinue once straight leg raise with < 10 degree lag Restrictions Weight Bearing Restrictions: No Other Position/Activity Restrictions: R LE WBAT   Pertinent Vitals/Pain Pt reports minimal pain and taking pain pill this morning. Repositioned in recliner to comfort.    Mobility  Bed Mobility Bed Mobility: Supine to Sit Supine to Sit: 5: Supervision Details for Bed Mobility Assistance: verbal cues for technique Transfers Transfers: Stand to Sit;Sit to Stand Sit to Stand: 4: Min guard;With upper extremity assist;From bed;From chair/3-in-1 Stand to Sit: 4: Min guard;With upper extremity assist;To chair/3-in-1 Details for Transfer Assistance: verbal cue for safe technique first transfer and pt able to recall thereafter Ambulation/Gait Ambulation/Gait Assistance: 4: Min guard Ambulation Distance (Feet): 80 Feet Assistive device: Rolling walker Ambulation/Gait Assistance Details: verbal cues for step length, flat foot or heel strike, sequence, RW distance, posture Gait Pattern:  Step-to pattern;Antalgic Gait velocity: decreased    Exercises Total Joint Exercises Ankle Circles/Pumps: AROM;Both;15 reps Quad Sets: AROM;Both;15 reps Gluteal Sets: AROM;Both;15 reps Short Arc Quad: AROM;Right;15 reps Heel Slides: AROM;Right;15 reps;Seated Hip ABduction/ADduction: AROM;Right;15 reps Straight Leg Raises: AROM;Right;10 reps Goniometric ROM: active sitting knee flexion approx 100* with heel slide exercise   PT Diagnosis:    PT Problem List:   PT Treatment Interventions:     PT Goals Acute Rehab PT Goals PT Goal: Supine/Side to Sit - Progress: Progressing toward goal PT Goal: Sit to Stand - Progress: Progressing toward goal PT Goal: Stand to Sit - Progress: Progressing toward goal PT Goal: Ambulate - Progress: Progressing toward goal PT Goal: Perform Home Exercise Program - Progress: Progressing toward goal  Visit Information  Last PT Received On: 09/11/12 Assistance Needed: +1    Subjective Data  Subjective: I think I'd like to use the bathroom while you're here.   Cognition  Cognition Overall Cognitive Status: Appears within functional limits for tasks assessed/performed Arousal/Alertness: Awake/alert    Balance     End of Session PT - End of Session Activity Tolerance: Patient tolerated treatment well Patient left: in chair;with call bell/phone within reach;with family/visitor present   GP     Skyley Grandmaison,KATHrine E 09/11/2012, 10:44 AM Zenovia Jarred, PT, DPT 09/11/2012 Pager: [phone]

## 2012-09-11 NOTE — Progress Notes (Signed)
Physical Therapy Treatment Note   09/11/12 1500  PT Visit Information  Last PT Received On 09/11/12  Assistance Needed +1  PT Time Calculation  PT Start Time 1444  PT Stop Time 1457  PT Time Calculation (min) 13 min  Subjective Data  Subjective I'm sore from those heel slides.  Precautions  Precautions Knee  Restrictions  Other Position/Activity Restrictions R LE WBAT  Cognition  Overall Cognitive Status Appears within functional limits for tasks assessed/performed  Arousal/Alertness Awake/alert  Bed Mobility  Bed Mobility Sit to Supine  Sit to Supine 4: Min assist  Details for Bed Mobility Assistance assist for R LE  Transfers  Transfers Stand to Sit;Sit to Stand  Sit to Stand 4: Min guard;With upper extremity assist;From chair/3-in-1  Stand to Sit 4: Min guard;With upper extremity assist;To bed;To chair/3-in-1  Details for Transfer Assistance able to recall cues from earlier today   Ambulation/Gait  Ambulation/Gait Assistance 4: Min guard;5: Supervision  Ambulation Distance (Feet) 80 Feet  Assistive device Rolling walker  Ambulation/Gait Assistance Details verbal cues for step length and heel strike  Gait Pattern Step-to pattern;Antalgic  Gait velocity decreased  PT - End of Session  Activity Tolerance Patient tolerated treatment well  Patient left in bed;with call bell/phone within reach;with family/visitor present  PT - Assessment/Plan  Comments on Treatment Session Pt reports some soreness in R knee from exercises this morning however states no pain.  Pt agreeable to ambulate again and then assisted back to bed for ortho tech to place CPM.  PT Plan Discharge plan remains appropriate;Frequency remains appropriate  Follow Up Recommendations SNF  PT equipment Rolling walker with 5" wheels  Acute Rehab PT Goals  PT Goal: Sit to Supine/Side - Progress Progressing toward goal  PT Goal: Sit to Stand - Progress Progressing toward goal  PT Goal: Stand to Sit - Progress  Progressing toward goal  PT Goal: Ambulate - Progress Progressing toward goal  PT General Charges  $$ ACUTE PT VISIT 1 Procedure  PT Treatments  $Gait Training 8-22 mins  Ice packs applied to knee after session  Zenovia Jarred, PT, DPT 09/11/2012 Pager: [phone]

## 2012-09-11 NOTE — Evaluation (Signed)
Occupational Therapy Evaluation Patient Details Name: Meredith Moore MRN: [id number] DOB: Jun 20, 1936 Today's Date: 09/11/2012 Time: 3086-5784 OT Time Calculation (min): 20 min  OT Assessment / Plan / Recommendation Clinical Impression  This 77 year old female was admitted for R TKA.  She will benefit from skilled OT to increase independence with adls with supervision level goals in acute.      OT Assessment  Patient needs continued OT Services    Follow Up Recommendations  SNF    Barriers to Discharge      Equipment Recommendations  3 in 1 bedside comode    Recommendations for Other Services    Frequency  Min 2X/week    Precautions / Restrictions Precautions Precautions: Knee Precaution Comments: Pt able to perform SLR without knee lag Required Braces or Orthoses: Knee Immobilizer - Right Knee Immobilizer - Right: Discontinue once straight leg raise with < 10 degree lag Restrictions Other Position/Activity Restrictions: R LE WBAT   Pertinent Vitals/Pain R knee sore; repositioned after walking to bathroom    ADL  Grooming: Performed;Teeth care;Wash/dry hands;Supervision/safety Where Assessed - Grooming: Supported standing Upper Body Bathing: Simulated;Set up Where Assessed - Upper Body Bathing: Unsupported sitting Lower Body Bathing: Simulated;Minimal assistance Where Assessed - Lower Body Bathing: Supported sit to stand Upper Body Dressing: Simulated;Minimal assistance (iv) Where Assessed - Upper Body Dressing: Unsupported sitting Lower Body Dressing: Simulated;Moderate assistance Where Assessed - Lower Body Dressing: Supported sit to stand Toilet Transfer: Performed;Min Pension scheme manager Method: Sit to Barista: Raised toilet seat with arms (or 3-in-1 over toilet) Toileting - Clothing Manipulation and Hygiene: Performed;Min guard Where Assessed - Engineer, mining and Hygiene: Sit to stand from 3-in-1 or toilet Equipment  Used: Rolling walker Transfers/Ambulation Related to ADLs: ambulated to bathroom with RW:  no cues needed for sequencing ADL Comments: pt is familiar with AE but hasn't used it.  Her mother had used it after breaking a hip.      OT Diagnosis: Generalized weakness  OT Problem List: Decreased strength;Decreased activity tolerance;Impaired vision/perception;Decreased knowledge of use of DME or AE;Pain OT Treatment Interventions: Self-care/ADL training;DME and/or AE instruction;Patient/family education   OT Goals Acute Rehab OT Goals OT Goal Formulation: With patient Time For Goal Achievement: 09/18/12 Potential to Achieve Goals: Good ADL Goals Pt Will Perform Lower Body Bathing: with supervision;Sit to stand from chair (with ae) ADL Goal: Lower Body Bathing - Progress: Goal set today Pt Will Perform Lower Body Dressing: with supervision;Sitting, bed;with adaptive equipment ADL Goal: Lower Body Dressing - Progress: Goal set today Pt Will Transfer to Toilet: Ambulation;with supervision;3-in-1 ADL Goal: Toilet Transfer - Progress: Goal set today  Visit Information  Last OT Received On: 09/11/12 Assistance Needed: +1    Subjective Data  Subjective: "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" Patient Stated Goal: get back to being active:  was in silver sneakers at [employer] and knits   Prior Functioning     Home Living Lives With: Spouse Additional Comments: no first floor set up Communication Communication: No difficulties         Vision/Perception Vision - History Baseline Vision:  (visually impaired) Patient Visual Report: No change from baseline   Cognition  Cognition Overall Cognitive Status: Appears within functional limits for tasks assessed/performed Arousal/Alertness: Awake/alert Orientation Level: Appears intact for tasks assessed Behavior During Session: [facility] for tasks performed    Extremity/Trunk Assessment Right Upper Extremity Assessment RUE  ROM/Strength/Tone: [facility] for tasks assessed Left Upper Extremity Assessment LUE ROM/Strength/Tone: [facility] for  tasks assessed     Mobility Transfers Sit to Stand: 4: Min guard;With upper extremity assist;From bed;From chair/3-in-1 Details for Transfer Assistance: no cues needed     Exercise     Balance     End of Session OT - End of Session Activity Tolerance: Patient tolerated treatment well Patient left: in chair;with call bell/phone within reach  GO     Meredith Moore 09/11/2012, 2:19 PM Marica Otter, OTR/L [phone] 09/11/2012

## 2012-09-11 NOTE — Progress Notes (Signed)
   Subjective: 1 Day Post-Op Procedure(s) (LRB): RIGHT TOTAL KNEE ARTHROPLASTY (Right) Patient reports pain as mild.   Patient seen in rounds with Dr. Lequita Halt.  She is doing great this morning.  HGB is low this morning but no symptoms.  Will get up with therapy and see how she does. Patient is well, and has had no acute complaints or problems We will start therapy today.  Plan is to go Skilled nursing facility after hospital stay.  Objective: Vital signs in last 24 hours: Temp:  [96.7 F (35.9 C)-98.1 F (36.7 C)] 98.1 F (36.7 C) (02/11 0558) Pulse Rate:  [54-85] 85 (02/11 0558) Resp:  [15-16] 16 (02/11 0558) BP: (88-114)/(56-74) 91/57 mmHg (02/11 0558) SpO2:  [99 %-100 %] 100 % (02/11 0558) Weight:  [63.5 kg (139 lb 15.9 oz)] 63.5 kg (139 lb 15.9 oz) (02/10 2100)  Intake/Output from previous day:  Intake/Output Summary (Last 24 hours) at 09/11/12 0752 Last data filed at 09/11/12 0600  Gross per 24 hour  Intake   3915 ml  Output   3710 ml  Net    205 ml    Intake/Output this shift:    Labs:  Recent Labs  09/11/12 0420  HGB 8.1*    Recent Labs  09/11/12 0420  WBC 9.0  RBC 2.65*  HCT 24.2*  PLT 204    Recent Labs  09/11/12 0420  NA 136  K 3.9  CL 104  CO2 24  BUN 10  CREATININE 0.73  GLUCOSE 150*  CALCIUM 8.0*   No results found for this basename: LABPT, INR,  in the last 72 hours  EXAM General - Patient is Alert, Appropriate and Oriented Extremity - Neurovascular intact Sensation intact distally Dorsiflexion/Plantar flexion intact Dressing - dressing C/D/I Motor Function - intact, moving foot and toes well on exam.  Hemovac pulled without difficulty.  Past Medical History  Diagnosis Date  . Arthritis   . Glaucoma   . Macular degeneration     Assessment/Plan: 1 Day Post-Op Procedure(s) (LRB): RIGHT TOTAL KNEE ARTHROPLASTY (Right) Principal Problem:   OA (osteoarthritis) of knee Active Problems:   Postoperative anemia due to acute  blood loss  Estimated body mass index is 24.02 kg/(m^2) as calculated from the following:   Height as of this encounter: 5\' 4"  (1.626 m).   Weight as of this encounter: 63.5 kg (139 lb 15.9 oz). Advance diet Up with therapy Discharge to SNF  DVT Prophylaxis - Xarelto Weight-Bearing as tolerated to right leg No vaccines. D/C O2 and Pulse OX and try on Room [address]  Patrica Duel 09/11/2012, 7:52 AM

## 2012-09-12 LAB — BASIC METABOLIC PANEL
CO2: 24 mEq/L (ref 19–32)
Calcium: 7.9 mg/dL — ABNORMAL LOW (ref 8.4–10.5)
Creatinine, Ser: 0.76 mg/dL (ref 0.50–1.10)
GFR calc non Af Amer: 81 mL/min — ABNORMAL LOW (ref >90)
Glucose, Bld: 137 mg/dL — ABNORMAL HIGH (ref 70–99)
Sodium: 135 mEq/L (ref 135–145)

## 2012-09-12 LAB — CBC
MCH: 30.8 pg (ref 26.0–34.0)
MCHC: 33.5 g/dL (ref 30.0–36.0)
MCV: 92.1 fL (ref 78.0–100.0)
Platelets: 193 10*3/uL (ref 150–400)
RBC: 2.27 MIL/uL — ABNORMAL LOW (ref 3.87–5.11)

## 2012-09-12 LAB — PREPARE RBC (CROSSMATCH)

## 2012-09-12 MED ORDER — ACETAMINOPHEN 325 MG PO TABS
650.0000 mg | ORAL_TABLET | Freq: Once | ORAL | Status: AC
  Administered 2012-09-12: 650 mg via ORAL
  Filled 2012-09-12: qty 2

## 2012-09-12 NOTE — Progress Notes (Signed)
   Subjective: 2 Days Post-Op Procedure(s) (LRB): RIGHT TOTAL KNEE ARTHROPLASTY (Right) Patient reports pain as mild.   Patient seen in rounds with Dr. Lequita Halt. Patient is well, and has had no acute complaints or problems.  HGB is lower today down to 7.0. Will give blood today and probably to SNF tomorrow. Plan is to go Skilled nursing facility after hospital stay.  Objective: Vital signs in last 24 hours: Temp:  [97.8 F (36.6 C)-98.6 F (37 C)] 98.6 F (37 C) (02/12 0626) Pulse Rate:  [67-88] 82 (02/12 0626) Resp:  [14-16] 14 (02/12 0626) BP: (92-127)/(56-80) 102/67 mmHg (02/12 0626) SpO2:  [97 %-99 %] 99 % (02/12 0626)  Intake/Output from previous day:  Intake/Output Summary (Last 24 hours) at 09/12/12 0729 Last data filed at 09/12/12 0600  Gross per 24 hour  Intake   2760 ml  Output   1500 ml  Net   1260 ml    Intake/Output this shift:    Labs:  Recent Labs  09/11/12 0420 09/12/12 0407  HGB 8.1* 7.0*    Recent Labs  09/11/12 0420 09/12/12 0407  WBC 9.0 10.4  RBC 2.65* 2.27*  HCT 24.2* 20.9*  PLT 204 193    Recent Labs  09/11/12 0420 09/12/12 0407  NA 136 135  K 3.9 3.4*  CL 104 104  CO2 24 24  BUN 10 9  CREATININE 0.73 0.76  GLUCOSE 150* 137*  CALCIUM 8.0* 7.9*   No results found for this basename: LABPT, INR,  in the last 72 hours  EXAM General - Patient is Alert, Appropriate and Oriented Extremity - Neurovascular intact Sensation intact distally Dorsiflexion/Plantar flexion intact Dressing/Incision - clean, dry, no drainage, healing Motor Function - intact, moving foot and toes well on exam.   Past Medical History  Diagnosis Date  . Arthritis   . Glaucoma   . Macular degeneration     Assessment/Plan: 2 Days Post-Op Procedure(s) (LRB): RIGHT TOTAL KNEE ARTHROPLASTY (Right) Principal Problem:   OA (osteoarthritis) of knee Active Problems:   Postoperative anemia due to acute blood loss  Estimated body mass index is 24.02  kg/(m^2) as calculated from the following:   Height as of this encounter: 5\' 4"  (1.626 m).   Weight as of this encounter: 63.5 kg (139 lb 15.9 oz). Up with therapy Plan for discharge tomorrow Discharge to SNF  DVT Prophylaxis - Xarelto Weight-Bearing as tolerated to right leg Blood today Recheck labs in AM  PERKINS, ALEXZANDREW 09/12/2012, 7:29 AM

## 2012-09-12 NOTE — Progress Notes (Signed)
Physical Therapy Treatment Patient Details Name: Daniah Zaldivar MRN: [id number] DOB: [birth year] Today's Date: 09/12/2012 Time: 8295-6213 PT Time Calculation (min): 26 min  PT Assessment / Plan / Recommendation Comments on Treatment Session       Follow Up Recommendations        Does the patient have the potential to tolerate intense rehabilitation     Barriers to Discharge        Equipment Recommendations       Recommendations for Other Services    Frequency 7X/week   Plan Discharge plan remains appropriate    Precautions / Restrictions Precautions Precautions: Knee Precaution Comments: Pt able to perform SLR without knee lag Restrictions Weight Bearing Restrictions: No RLE Weight Bearing: Weight bearing as tolerated Other Position/Activity Restrictions: R LE WBAT   Pertinent Vitals/Pain Pt wth very little pain, 0/10 upon entering, up to 3/10 with exercises.    Mobility  Bed Mobility Bed Mobility: Supine to Sit Supine to Sit: 4: Min assist Sit to Supine: 4: Min assist Details for Bed Mobility Assistance: assist for R LE Transfers Transfers: Sit to Stand;Stand to Sit Sit to Stand: 4: Min guard;From chair/3-in-1;With armrests Stand to Sit: 4: Min guard;To chair/3-in-1;With armrests Ambulation/Gait Ambulation/Gait Assistance: 4: Min guard Ambulation Distance (Feet): 10 Feet Assistive device: Rolling walker Ambulation/Gait Assistance Details: limited distance due to focus this session waas exercises and pt getting her last unit of blood Gait Pattern: Step-to pattern    Exercises Total Joint Exercises Ankle Circles/Pumps: AROM;Right;10 reps;Supine Quad Sets: AROM;Both;15 reps Gluteal Sets: AROM;Both;15 reps Short Arc Quad: AROM;Right;15 reps Heel Slides: AROM;Right;15 reps;Seated Hip ABduction/ADduction: AROM;Right;15 reps Straight Leg Raises: AROM;Right;10 reps   PT Diagnosis:    PT Problem List:   PT Treatment Interventions:     PT Goals Acute Rehab  PT Goals Pt will go Supine/Side to Sit: with modified independence PT Goal: Supine/Side to Sit - Progress: Progressing toward goal PT Goal: Sit to Supine/Side - Progress: Progressing toward goal PT Goal: Sit to Stand - Progress: Progressing toward goal PT Goal: Stand to Sit - Progress: Progressing toward goal PT Goal: Ambulate - Progress: Progressing toward goal PT Goal: Perform Home Exercise Program - Progress: Progressing toward goal  Visit Information  Last PT Received On: 09/12/12 Assistance Needed: +1    Subjective Data  Subjective: I am feeling better this afternoon.  Patient Stated Goal: I amheading to [employer] tomorrow   Cognition  Cognition Overall Cognitive Status: Appears within functional limits for tasks assessed/performed Arousal/Alertness: Awake/alert Orientation Level: Appears intact for tasks assessed Behavior During Session: [facility] for tasks performed    Balance     End of Session PT - End of Session Activity Tolerance: Patient tolerated treatment well Patient left: in chair;with call bell/phone within reach   GP     Prudencio Velazco, [facility] 09/12/2012, 3:23 PM

## 2012-09-12 NOTE — Progress Notes (Signed)
Physical Therapy Treatment Patient Details Name: Meredith Moore MRN: [id number] DOB: [birth year] Today's Date: 09/12/2012 Time: 4098-1191 PT Time Calculation (min): 13 min  PT Assessment / Plan / Recommendation Comments on Treatment Session  Noted pt has lower Hgb on today. Planning to get transfusion. Somewhat easily fatigued this session. Will plan to see for 2nd session this afternoon after transfusion complete. Continue to recommend SNF for rehab.     Follow Up Recommendations  SNF     Does the patient have the potential to tolerate intense rehabilitation     Barriers to Discharge        Equipment Recommendations  Rolling walker with 5" wheels    Recommendations for Other Services    Frequency 7X/week   Plan Discharge plan remains appropriate    Precautions / Restrictions Precautions Precautions: Knee Restrictions Weight Bearing Restrictions: No RLE Weight Bearing: Weight bearing as tolerated   Pertinent Vitals/Pain 4/10 R knee    Mobility  Bed Mobility Bed Mobility: Not assessed Transfers Transfers: Sit to Stand;Stand to Sit Sit to Stand: 4: Min guard;From chair/3-in-1;With armrests Stand to Sit: 4: Min guard;To chair/3-in-1;With armrests Details for Transfer Assistance: VCSs safety, technique, hand placement Ambulation/Gait Ambulation/Gait Assistance: 4: Min guard Ambulation Distance (Feet): 60 Feet Assistive device: Rolling walker Ambulation/Gait Assistance Details: Slow gait speed. Decreased distance due to pt feeling weak, dizzy (decreased Hgb?).  Gait Pattern: Step-to pattern;Antalgic    Exercises     PT Diagnosis:    PT Problem List:   PT Treatment Interventions:     PT Goals Acute Rehab PT Goals Pt will go Supine/Side to Sit: with modified independence PT Goal: Supine/Side to Sit - Progress: Progressing toward goal Pt will go Sit to Supine/Side: with modified independence PT Goal: Sit to Supine/Side - Progress: Progressing toward goal Pt will  go Sit to Stand: with modified independence PT Goal: Sit to Stand - Progress: Progressing toward goal Pt will go Stand to Sit: with modified independence PT Goal: Stand to Sit - Progress: Progressing toward goal Pt will Ambulate: 51 - 150 feet;with modified independence;with least restrictive assistive device PT Goal: Ambulate - Progress: Progressing toward goal  Visit Information  Last PT Received On: 09/12/12 Assistance Needed: +1    Subjective Data  Subjective: I feel a little weak and dizzy right now Patient Stated Goal: SNF for rehab   Cognition  Cognition Overall Cognitive Status: Appears within functional limits for tasks assessed/performed Arousal/Alertness: Awake/alert Orientation Level: Appears intact for tasks assessed Behavior During Session: [facility] for tasks performed    Balance     End of Session PT - End of Session Equipment Utilized During Treatment: Gait belt Activity Tolerance: Patient tolerated treatment well Patient left: in chair;with call bell/phone within reach   GP     Rebeca Alert [facility] 09/12/2012, 9:41 AM [phone]

## 2012-09-12 NOTE — Progress Notes (Signed)
CSW following to assist with d/c planning. Pt has accepted ST Rehab bed at [facility]. CSW will continue to follow to assist with d/c planning to SNF.  Cori Razor LCSW [phone]

## 2012-09-13 LAB — TYPE AND SCREEN: Unit division: 0

## 2012-09-13 LAB — CBC
HCT: 27.5 % — ABNORMAL LOW (ref 36.0–46.0)
MCHC: 33.5 g/dL (ref 30.0–36.0)
MCV: 88.7 fL (ref 78.0–100.0)
Platelets: 176 10*3/uL (ref 150–400)
RDW: 14.4 % (ref 11.5–15.5)

## 2012-09-13 MED ORDER — OXYCODONE HCL 5 MG PO TABS: 5.0000 mg | ORAL_TABLET | ORAL | Status: DC | PRN

## 2012-09-13 MED ORDER — TRAMADOL HCL 50 MG PO TABS: 50.0000 mg | ORAL_TABLET | Freq: Four times a day (QID) | ORAL | Status: DC | PRN

## 2012-09-13 MED ORDER — RIVAROXABAN 10 MG PO TABS: 10.0000 mg | ORAL_TABLET | Freq: Every day | ORAL | Status: DC

## 2012-09-13 MED ORDER — METHOCARBAMOL 500 MG PO TABS: 500.0000 mg | ORAL_TABLET | Freq: Four times a day (QID) | ORAL | Status: DC | PRN

## 2012-09-13 NOTE — Progress Notes (Signed)
CSW assisting with d/c planning. [facility] contacted to see if they were accepting admissions today. No one in Admissions is available, as of yet. Pt contacted and updated. Expect SNF bed to be available tomorrow for pt but will clarify and update pt / MD.  Cori Razor LCSW [phone]

## 2012-09-13 NOTE — Progress Notes (Signed)
Physical Therapy Treatment Patient Details Name: Meredith Moore MRN: [id number] DOB: 08/05/1935 Today's Date: 09/13/2012 Time: 7829-5621 PT Time Calculation (min): 25 min  PT Assessment / Plan / Recommendation Comments on Treatment Session  POD #3 R TKR am session.  Pt doing much better after bolld transfusion and good night sleep. Assisted pt OOB to BR then to amb in hallway.  Performed TKR TE's then applied  Pt plans to D/C to [facility] tomorrow.    Follow Up Recommendations  SNF     Does the patient have the potential to tolerate intense rehabilitation     Barriers to Discharge        Equipment Recommendations  Rolling walker with 5" wheels    Recommendations for Other Services    Frequency 7X/week   Plan Discharge plan remains appropriate    Precautions / Restrictions Precautions Precautions: Knee Precaution Comments: D/C KI pt able to perform active SLR Restrictions Weight Bearing Restrictions: No RLE Weight Bearing: Weight bearing as tolerated   Pertinent Vitals/Pain C/o 4/10 during TE's ICE applied    Mobility  Bed Mobility Bed Mobility: Supine to Sit Supine to Sit: 4: Min assist;4: Min guard Details for Bed Mobility Assistance: increased time Transfers Transfers: Sit to Stand;Stand to Sit Sit to Stand: 4: Min guard;From chair/3-in-1;With armrests;From toilet Stand to Sit: To toilet;To chair/3-in-1;4: Min guard Details for Transfer Assistance: <25% VC's on safety with turns and using RW troughout turn Ambulation/Gait Ambulation/Gait Assistance: 4: Min guard Ambulation Distance (Feet): 74 Feet Assistive device: Rolling walker Ambulation/Gait Assistance Details: 25% VC's to increased WB thru R LE and also required increased time Gait Pattern: Step-to pattern;Decreased stride length;Trunk flexed;Decreased stance time - right Gait velocity: decreased    Exercises Total Joint Exercises Ankle Circles/Pumps: AROM;Right;10 reps;Supine Quad Sets:  AROM;Both;10 reps Gluteal Sets: AROM;Both;10 reps Towel Squeeze: AROM;Right;10 reps Short Arc Quad: AROM;Right;10 reps Heel Slides: AROM;Right;Seated;10 reps Hip ABduction/ADduction: AROM;Right;10 reps Straight Leg Raises: AROM;Right;10 reps   PT Goals                                               progressing    Visit Information  Last PT Received On: 09/13/12 Assistance Needed: +1    Subjective Data      Cognition       Balance   fair  End of Session PT - End of Session Equipment Utilized During Treatment: Gait belt Activity Tolerance: Patient tolerated treatment well Patient left: in chair;with call bell/phone within reach (ICE to R knee) CPM Right Knee CPM Right Knee: Off   Felecia Shelling  PTA [facility]  Acute  Rehab Pager      [phone]

## 2012-09-13 NOTE — Progress Notes (Signed)
   Subjective: 3 Days Post-Op Procedure(s) (LRB): RIGHT TOTAL KNEE ARTHROPLASTY (Right) Patient reports pain as 4 on 0-10 scale and mild.   Patient seen in rounds with Dr. Lequita Halt. Patient is well, and has had no acute complaints or problems Plan is to go Skilled nursing facility after hospital stay.  Objective: Vital signs in last 24 hours: Temp:  [97.5 F (36.4 C)-99.7 F (37.6 C)] 98.6 F (37 C) (02/13 0407) Pulse Rate:  [67-118] 82 (02/13 0407) Resp:  [14-18] 16 (02/13 0407) BP: (105-129)/(65-78) 122/74 mmHg (02/13 0407) SpO2:  [95 %-98 %] 95 % (02/13 0407)  Intake/Output from previous day:  Intake/Output Summary (Last 24 hours) at 09/13/12 0944 Last data filed at 09/13/12 4098  Gross per 24 hour  Intake 856.67 ml  Output    300 ml  Net 556.67 ml    Intake/Output this shift: Total I/O In: 240 [P.O.:240] Out: -   Labs:  Recent Labs  09/11/12 0420 09/12/12 0407 09/13/12 0405  HGB 8.1* 7.0* 9.2*    Recent Labs  09/12/12 0407 09/13/12 0405  WBC 10.4 8.9  RBC 2.27* 3.10*  HCT 20.9* 27.5*  PLT 193 176    Recent Labs  09/11/12 0420 09/12/12 0407  NA 136 135  K 3.9 3.4*  CL 104 104  CO2 24 24  BUN 10 9  CREATININE 0.73 0.76  GLUCOSE 150* 137*  CALCIUM 8.0* 7.9*   No results found for this basename: LABPT, INR,  in the last 72 hours  EXAM General - Patient is Alert, Appropriate and Oriented Extremity - Neurovascular intact Sensation intact distally Dorsiflexion/Plantar flexion intact No cellulitis present Dressing/Incision - clean, dry, no drainage, healing Motor Function - intact, moving foot and toes well on exam.   Past Medical History  Diagnosis Date  . Arthritis   . Glaucoma   . Macular degeneration     Assessment/Plan: 3 Days Post-Op Procedure(s) (LRB): RIGHT TOTAL KNEE ARTHROPLASTY (Right) Principal Problem:   OA (osteoarthritis) of knee Active Problems:   Postoperative anemia due to acute blood loss  Estimated body mass  index is 24.02 kg/(m^2) as calculated from the following:   Height as of this encounter: 5\' 4"  (1.626 m).   Weight as of this encounter: 63.5 kg (139 lb 15.9 oz). Up with therapy Plan for discharge tomorrow Discharge to SNF  DVT Prophylaxis - Xarelto Weight-Bearing as tolerated to right leg  Michail Boyte 09/13/2012, 9:44 AM

## 2012-09-13 NOTE — Progress Notes (Signed)
UR completed 

## 2012-09-13 NOTE — Progress Notes (Signed)
CSW spoke with Admissions at [facility]. SNF bed will be available for pt tomorrow if ready for d/c. Pt has been updated.  Cori Razor LCSW [phone]

## 2012-09-14 NOTE — Progress Notes (Signed)
Clinical Social Work Department CLINICAL SOCIAL WORK PLACEMENT NOTE 09/14/2012  Patient:  Meredith Moore, Meredith Moore  Account Number:  [id number] Admit date:  09/10/2012  Clinical Social Worker:  Cori Razor, LCSW  Date/time:  09/10/2012 03:21 PM  Clinical Social Work is seeking post-discharge placement for this patient at the following level of care:   SKILLED NURSING   (*CSW will update this form in Epic as items are completed)   09/10/2012  Patient/family provided with [facility] Department of Clinical Social Work's list of facilities offering this level of care within the geographic area requested by the patient (or if unable, by the patient's family).  09/10/2012  Patient/family informed of their freedom to choose among providers that offer the needed level of care, that participate in Medicare, Medicaid or managed care program needed by the patient, have an available bed and are willing to accept the patient.  09/10/2012  Patient/family informed of [facility]' ownership interest in [facility], as well as of the fact that they are under no obligation to receive care at this facility.  PASARR submitted to EDS on 09/10/2012 PASARR number received from EDS on 09/10/2012  FL2 transmitted to all facilities in geographic area requested by pt/family on  09/10/2012 FL2 transmitted to all facilities within larger geographic area on   Patient informed that his/her managed care company has contracts with or will negotiate with  certain facilities, including the following:     Patient/family informed of bed offers received:  09/12/2012 Patient chooses bed at [facility] AND [facility] Physician recommends and patient chooses bed at    Patient to be transferred to [facility] REHAB on  09/14/2012 Patient to be transferred to facility by FAMILY  The following physician request were entered in Epic:   Additional Comments: Pt interested in [facility] and Rehab.  Cori Razor LCSW [phone]

## 2012-09-14 NOTE — Care Management Note (Signed)
    Page 1 of 1   09/14/2012     3:07:03 PM   CARE MANAGEMENT NOTE 09/14/2012  Patient:  LAKELA, KUBA   Account Number:  [id number]  Date Initiated:  09/14/2012  Documentation initiated by:  Colleen Can  Subjective/Objective Assessment:   DX RIGHT TOTAL KNEE REEPLACEMNT     Action/Plan:   SNF REHAB   Anticipated DC Date:  09/14/2012   Anticipated DC Plan:  SKILLED NURSING FACILITY  In-house referral  Clinical Social Worker      DC Planning Services  CM consult      Choice offered to / List presented to:             Status of service:  Completed, signed off Medicare Important Message given?   (If response is "NO", the following Medicare IM given date fields will be blank) Date Medicare IM given:   Date Additional Medicare IM given:    Discharge Disposition:  SKILLED NURSING FACILITY  Per UR Regulation:    If discussed at Long Length of Stay Meetings, dates discussed:    Comments:

## 2012-09-14 NOTE — Discharge Summary (Signed)
Physician Discharge Summary   Patient ID: Meredith Moore MRN: [id number] DOB/AGE: 1935/12/05 77 y.o.  Admit date: 09/10/2012 Discharge date: 09/14/2012  Primary Diagnosis:  Osteoarthritis Right knee  Admission Diagnoses:  Past Medical History  Diagnosis Date  . Arthritis   . Glaucoma   . Macular degeneration    Discharge Diagnoses:   Principal Problem:   OA (osteoarthritis) of knee Active Problems:   Postoperative anemia due to acute blood loss  Estimated body mass index is 24.02 kg/(m^2) as calculated from the following:   Height as of this encounter: 5\' 4"  (1.626 m).   Weight as of this encounter: 63.5 kg (139 lb 15.9 oz).  Classification of overweight in adults according to BMI (WHO, 1998)   Procedure:  Procedure(s) (LRB): RIGHT TOTAL KNEE ARTHROPLASTY (Right)   Consults: None  HPI: Meredith Moore is a 77 y.o. year old female with end stage OA of her right knee with progressively worsening pain and dysfunction. She has constant pain, with activity and at rest and significant functional deficits with difficulties even with ADLs. She has had extensive non-op management including analgesics, injections of cortisone and home exercise program, but remains in significant pain with significant dysfunction.Radiographs show bone on bone arthritis lateral and patellofemoral. She presents now for right Total Knee Arthroplasty. She has a nickel allergy and we thus are utilizing the Katr[employer]a [employer]hew oxinium knee   Laboratory Data: Admission on 09/10/2012  Component Date Value Range Status  . ABO/RH(D) 09/10/2012 A POS   Final  . Antibody Screen 09/10/2012 NEG   Final  . Sample Expiration 09/10/2012 09/13/2012   Final  . Unit Number 09/10/2012 [id number]   Final  . Blood Component Type 09/10/2012 RED CELLS,LR   Final  . Unit division 09/10/2012 00   Final  . Status of Unit 09/10/2012 ISSUED,FINAL   Final  . Transfusion Status 09/10/2012 OK TO TRANSFUSE   Final  .  Crossmatch Result 09/10/2012 Compatible   Final  . Unit Number 09/10/2012 [id number]   Final  . Blood Component Type 09/10/2012 RED CELLS,LR   Final  . Unit division 09/10/2012 00   Final  . Status of Unit 09/10/2012 ISSUED,FINAL   Final  . Transfusion Status 09/10/2012 OK TO TRANSFUSE   Final  . Crossmatch Result 09/10/2012 Compatible   Final  . ABO/RH(D) 09/10/2012 A POS   Final  . WBC 09/11/2012 9.0  4.0 - 10.5 K/uL Final  . RBC 09/11/2012 2.65* 3.87 - 5.11 MIL/uL Final  . Hemoglobin 09/11/2012 8.1* 12.0 - 15.0 g/dL Final  . HCT 95/63/8756 24.2* 36.0 - 46.0 % Final  . MCV 09/11/2012 91.3  78.0 - 100.0 fL Final  . MCH 09/11/2012 30.6  26.0 - 34.0 pg Final  . MCHC 09/11/2012 33.5  30.0 - 36.0 g/dL Final  . RDW 43/32/9518 13.2  11.5 - 15.5 % Final  . Platelets 09/11/2012 204  150 - 400 K/uL Final  . Sodium 09/11/2012 136  135 - 145 mEq/L Final  . Potassium 09/11/2012 3.9  3.5 - 5.1 mEq/L Final  . Chloride 09/11/2012 104  96 - 112 mEq/L Final  . CO2 09/11/2012 24  19 - 32 mEq/L Final  . Glucose, Bld 09/11/2012 150* 70 - 99 mg/dL Final  . BUN 84/16/6063 10  6 - 23 mg/dL Final  . Creatinine, Ser 09/11/2012 0.73  0.50 - 1.10 mg/dL Final  . Calcium 01/60/1093 8.0* 8.4 - 10.5 mg/dL Final  . GFR calc non Af Amer 09/11/2012 82* >90  mL/min Final  . GFR calc Af Amer 09/11/2012 >90  >90 mL/min Final   Comment:                                 The eGFR has been calculated                          using the CKD EPI equation.                          This calculation has not been                          validated in all clinical                          situations.                          eGFR's persistently                          <90 mL/min signify                          possible Chronic Kidney Disease.  . WBC 09/12/2012 10.4  4.0 - 10.5 K/uL Final  . RBC 09/12/2012 2.27* 3.87 - 5.11 MIL/uL Final  . Hemoglobin 09/12/2012 7.0* 12.0 - 15.0 g/dL Final  . HCT 45/40/9811 20.9* 36.0 -  46.0 % Final  . MCV 09/12/2012 92.1  78.0 - 100.0 fL Final  . MCH 09/12/2012 30.8  26.0 - 34.0 pg Final  . MCHC 09/12/2012 33.5  30.0 - 36.0 g/dL Final  . RDW 91/47/8295 13.5  11.5 - 15.5 % Final  . Platelets 09/12/2012 193  150 - 400 K/uL Final  . Sodium 09/12/2012 135  135 - 145 mEq/L Final  . Potassium 09/12/2012 3.4* 3.5 - 5.1 mEq/L Final  . Chloride 09/12/2012 104  96 - 112 mEq/L Final  . CO2 09/12/2012 24  19 - 32 mEq/L Final  . Glucose, Bld 09/12/2012 137* 70 - 99 mg/dL Final  . BUN 62/13/0865 9  6 - 23 mg/dL Final  . Creatinine, Ser 09/12/2012 0.76  0.50 - 1.10 mg/dL Final  . Calcium 78/46/9629 7.9* 8.4 - 10.5 mg/dL Final  . GFR calc non Af Amer 09/12/2012 81* >90 mL/min Final  . GFR calc Af Amer 09/12/2012 >90  >90 mL/min Final   Comment:                                 The eGFR has been calculated                          using the CKD EPI equation.                          This calculation has not been                          validated in all clinical  situations.                          eGFR's persistently                          <90 mL/min signify                          possible Chronic Kidney Disease.  . Order Confirmation 09/12/2012 ORDER PROCESSED BY BLOOD BANK   Final  . WBC 09/13/2012 8.9  4.0 - 10.5 K/uL Final  . RBC 09/13/2012 3.10* 3.87 - 5.11 MIL/uL Final  . Hemoglobin 09/13/2012 9.2* 12.0 - 15.0 g/dL Final   Comment: POST TRANSFUSION SPECIMEN                          DELTA CHECK NOTED  . HCT 09/13/2012 27.5* 36.0 - 46.0 % Final  . MCV 09/13/2012 88.7  78.0 - 100.0 fL Final  . MCH 09/13/2012 29.7  26.0 - 34.0 pg Final  . MCHC 09/13/2012 33.5  30.0 - 36.0 g/dL Final  . RDW 16/05/9603 14.4  11.5 - 15.5 % Final  . Platelets 09/13/2012 176  150 - 400 K/uL Final  Hospital Outpatient Visit on 08/30/2012  Component Date Value Range Status  . aPTT 08/30/2012 37  24 - 37 seconds Final   Comment:                                 IF  BASELINE aPTT IS ELEVATED,                          SUGGEST PATIENT RISK ASSESSMENT                          BE USED TO DETERMINE APPROPRIATE                          ANTICOAGULANT THERAPY.  . WBC 08/30/2012 6.5  4.0 - 10.5 K/uL Final  . RBC 08/30/2012 4.17  3.87 - 5.11 MIL/uL Final  . Hemoglobin 08/30/2012 12.6  12.0 - 15.0 g/dL Final  . HCT 54/04/8118 38.5  36.0 - 46.0 % Final  . MCV 08/30/2012 92.3  78.0 - 100.0 fL Final  . MCH 08/30/2012 30.2  26.0 - 34.0 pg Final  . MCHC 08/30/2012 32.7  30.0 - 36.0 g/dL Final  . RDW 14/78/2956 12.9  11.5 - 15.5 % Final  . Platelets 08/30/2012 272  150 - 400 K/uL Final  . Sodium 08/30/2012 136  135 - 145 mEq/L Final  . Potassium 08/30/2012 4.4  3.5 - 5.1 mEq/L Final  . Chloride 08/30/2012 100  96 - 112 mEq/L Final  . CO2 08/30/2012 27  19 - 32 mEq/L Final  . Glucose, Bld 08/30/2012 96  70 - 99 mg/dL Final  . BUN 21/30/8657 16  6 - 23 mg/dL Final  . Creatinine, Ser 08/30/2012 0.88  0.50 - 1.10 mg/dL Final  . Calcium 84/69/6295 9.7  8.4 - 10.5 mg/dL Final  . Total Protein 08/30/2012 6.9  6.0 - 8.3 g/dL Final  . Albumin 28/41/3244 3.7  3.5 - 5.2 g/dL Final  . AST 08/03/7251 18  0 - 37 U/L  Final  . ALT 08/30/2012 13  0 - 35 U/L Final  . Alkaline Phosphatase 08/30/2012 65  39 - 117 U/L Final  . Total Bilirubin 08/30/2012 0.3  0.3 - 1.2 mg/dL Final  . GFR calc non Af Amer 08/30/2012 63* >90 mL/min Final  . GFR calc Af Amer 08/30/2012 73* >90 mL/min Final   Comment:                                 The eGFR has been calculated                          using the CKD EPI equation.                          This calculation has not been                          validated in all clinical                          situations.                          eGFR's persistently                          <90 mL/min signify                          possible Chronic Kidney Disease.  [name] Prothrombin Time 08/30/2012 12.2  11.6 - 15.2 seconds Final  . INR 08/30/2012 0.91   0.00 - 1.49 Final  . Color, Urine 08/30/2012 YELLOW  YELLOW Final  . APPearance 08/30/2012 CLEAR  CLEAR Final  . Specific Gravity, Urine 08/30/2012 1.007  1.005 - 1.030 Final  . pH 08/30/2012 7.0  5.0 - 8.0 Final  . Glucose, UA 08/30/2012 NEGATIVE  NEGATIVE mg/dL Final  . Hgb urine dipstick 08/30/2012 NEGATIVE  NEGATIVE Final  . Bilirubin Urine 08/30/2012 NEGATIVE  NEGATIVE Final  . Ketones, ur 08/30/2012 NEGATIVE  NEGATIVE mg/dL Final  . Protein, ur 16/05/9603 NEGATIVE  NEGATIVE mg/dL Final  . Urobilinogen, UA 08/30/2012 0.2  0.0 - 1.0 mg/dL Final  . Nitrite 54/04/8118 NEGATIVE  NEGATIVE Final  . Leukocytes, UA 08/30/2012 TRACE* NEGATIVE Final  . MRSA, PCR 08/30/2012 NEGATIVE  NEGATIVE Final  . Staphylococcus aureus 08/30/2012 NEGATIVE  NEGATIVE Final   Comment:                                 The Xpert SA Assay (FDA                          approved for NASAL specimens                          in patients over 45 years of age),                          is one component of  a comprehensive surveillance                          program.  Test performance has                          been validated by [facility] for patients greater                          than or equal to 56 year old.                          It is not intended                          to diagnose infection nor to                          guide or monitor treatment.  . Squamous Epithelial / LPF 08/30/2012 RARE  RARE Final  . WBC, UA 08/30/2012 0-2  <3 WBC/hpf Final  . Bacteria, UA 08/30/2012 RARE  RARE Final     X-Rays:No results found.  EKG:No orders found for this or any previous visit.   Hospital Course: Sharica Roedel is a 77 y.o. who was admitted to [facility]. They were brought to the operating room on 09/10/2012 and underwent Procedure(s): RIGHT TOTAL KNEE ARTHROPLASTY.  Patient tolerated the procedure well and was later transferred to the  recovery room and then to the orthopaedic floor for postoperative care.  They were given PO and IV analgesics for pain control following their surgery.  They were given 24 hours of postoperative antibiotics of  Anti-infectives   Start     Dose/Rate Route Frequency Ordered Stop   09/10/12 1600  ceFAZolin (ANCEF) IVPB 1 g/50 mL premix     1 g 100 mL/hr over 30 Minutes Intravenous Every 6 hours 09/10/12 1221 09/10/12 2131   09/10/12 0700  ceFAZolin (ANCEF) IVPB 2 g/50 mL premix     2 g 100 mL/hr over 30 Minutes Intravenous On call to O.R. 09/10/12 1610 09/10/12 0924     and started on DVT prophylaxis in the form of Xarelto.   PT and OT were ordered for total joint protocol.  Discharge planning consulted to help with postop disposition and equipment needs.  Patient had a decent night on the evening of surgery and started to get up OOB with therapy on day one and walked 80 feet. Hemovac drain was pulled without difficulty. HGB down to 7.0 so patient received tow units of blood.  Continued to work with therapy into day two walking 60 feet.  Dressing was changed on day two and the incision was healing well.  The patient had progressed with therapy on day three but due to inclement weather, all rehab facilities were unable to accept transfers so patient remained in hospital one more day.  Incision was healing well.  Patient was seen in rounds on POD 4 and was ready to go to SNF - [facility].   Discharge Medications: Prior to Admission medications   Medication Sig Start Date End Date Taking? Authorizing Provider  atorvastatin (LIPITOR) 10 MG tablet  Take 5 mg by mouth daily.   Yes Historical Provider, MD  ketorolac (ACULAR) 0.5 % ophthalmic solution Place 1 drop into the right eye 3 (three) times daily.   Yes Historical Provider, MD  latanoprost (XALATAN) 0.005 % ophthalmic solution Place 1 drop into both eyes at bedtime.   Yes Historical Provider, MD  timolol (TIMOPTIC) 0.5 % ophthalmic solution Place 1  drop into both eyes 2 (two) times daily.   Yes Historical Provider, MD  methocarbamol (ROBAXIN) 500 MG tablet Take 1 tablet (500 mg total) by mouth every 6 (six) hours as needed. 09/13/12   Loanne Drilling, MD  oxyCODONE (OXY IR/ROXICODONE) 5 MG immediate release tablet Take 1-2 tablets (5-10 mg total) by mouth every 3 (three) hours as needed. 09/13/12   Loanne Drilling, MD  rivaroxaban (XARELTO) 10 MG TABS tablet Take 1 tablet (10 mg total) by mouth daily with breakfast. 09/13/12   Loanne Drilling, MD  traMADol (ULTRAM) 50 MG tablet Take 1-2 tablets (50-100 mg total) by mouth every 6 (six) hours as needed. 09/13/12   Loanne Drilling, MD    Diet: Regular diet Activity:WBAT Follow-up:in 2 weeks Disposition - Skilled nursing facility - [facility] Discharged Condition: good      Medication List    STOP taking these medications       calcium-vitamin D 500-200 MG-UNIT per tablet  Commonly known as:  OSCAL WITH D     fish oil-omega-3 fatty acids 1000 MG capsule     ICAPS PO     LUTEIN ESTERS PO      TAKE these medications       atorvastatin 10 MG tablet  Commonly known as:  LIPITOR  Take 5 mg by mouth daily.     ketorolac 0.5 % ophthalmic solution  Commonly known as:  ACULAR  Place 1 drop into the right eye 3 (three) times daily.     latanoprost 0.005 % ophthalmic solution  Commonly known as:  XALATAN  Place 1 drop into both eyes at bedtime.     methocarbamol 500 MG tablet  Commonly known as:  ROBAXIN  Take 1 tablet (500 mg total) by mouth every 6 (six) hours as needed.     oxyCODONE 5 MG immediate release tablet  Commonly known as:  Oxy IR/ROXICODONE  Take 1-2 tablets (5-10 mg total) by mouth every 3 (three) hours as needed.     rivaroxaban 10 MG Tabs tablet  Commonly known as:  XARELTO  Take 1 tablet (10 mg total) by mouth daily with breakfast.     timolol 0.5 % ophthalmic solution  Commonly known as:  TIMOPTIC  Place 1 drop into both eyes 2 (two) times daily.       traMADol 50 MG tablet  Commonly known as:  ULTRAM  Take 1-2 tablets (50-100 mg total) by mouth every 6 (six) hours as needed.           Follow-up Information   Follow up with Loanne Drilling, MD. Schedule an appointment as soon as possible for a visit on 09/25/2012. (Call [phone] Monday to make the appointmennt)    Contact information:   [address], [address] [place] [address] [phone]       Signed: Patrica Duel 09/14/2012, 8:56 AM

## 2012-09-14 NOTE — Progress Notes (Signed)
   Subjective: 4 Days Post-Op Procedure(s) (LRB): RIGHT TOTAL KNEE ARTHROPLASTY (Right) Patient reports pain as mild.   Patient seen in rounds with Dr. Lequita Halt. Patient is well, and has had no acute complaints or problems Patient is ready to go SNF.  Objective: Vital signs in last 24 hours: Temp:  [98.4 F (36.9 C)-98.5 F (36.9 C)] 98.4 F (36.9 C) (02/14 0520) Pulse Rate:  [77-92] 77 (02/14 0520) Resp:  [16] 16 (02/14 0520) BP: (90-107)/(58-72) 104/65 mmHg (02/14 0520) SpO2:  [93 %-98 %] 96 % (02/14 0520)  Intake/Output from previous day:  Intake/Output Summary (Last 24 hours) at 09/14/12 0855 Last data filed at 09/13/12 2007  Gross per 24 hour  Intake    720 ml  Output      0 ml  Net    720 ml    Intake/Output this shift:    Labs:  Recent Labs  09/12/12 0407 09/13/12 0405  HGB 7.0* 9.2*    Recent Labs  09/12/12 0407 09/13/12 0405  WBC 10.4 8.9  RBC 2.27* 3.10*  HCT 20.9* 27.5*  PLT 193 176    Recent Labs  09/12/12 0407  NA 135  K 3.4*  CL 104  CO2 24  BUN 9  CREATININE 0.76  GLUCOSE 137*  CALCIUM 7.9*   No results found for this basename: LABPT, INR,  in the last 72 hours  EXAM: General - Patient is Alert, Appropriate and Oriented Extremity - Neurovascular intact Sensation intact distally Dorsiflexion/Plantar flexion intact No cellulitis present Incision - clean, dry, no drainage, healing Motor Function - intact, moving foot and toes well on exam.   Assessment/Plan: 4 Days Post-Op Procedure(s) (LRB): RIGHT TOTAL KNEE ARTHROPLASTY (Right) Procedure(s) (LRB): RIGHT TOTAL KNEE ARTHROPLASTY (Right) Past Medical History  Diagnosis Date  . Arthritis   . Glaucoma   . Macular degeneration    Principal Problem:   OA (osteoarthritis) of knee Active Problems:   Postoperative anemia due to acute blood loss  Estimated body mass index is 24.02 kg/(m^2) as calculated from the following:   Height as of this encounter: 5\' 4"  (1.626 m).  Weight as of this encounter: 63.5 kg (139 lb 15.9 oz). Up with therapy Diet - Regular diet Follow up - in 2 weeks Activity - WBAT Disposition - Skilled nursing facility Condition Upon Discharge - Good D/C Meds - See DC Summary DVT Prophylaxis - Xarelto  PERKINS, ALEXZANDREW 09/14/2012, 8:55 AM

## 2013-01-18 ENCOUNTER — Other Ambulatory Visit: Payer: Self-pay | Admitting: Internal Medicine

## 2013-02-20 ENCOUNTER — Other Ambulatory Visit: Payer: Self-pay | Admitting: *Deleted

## 2013-02-20 ENCOUNTER — Encounter: Payer: Self-pay | Admitting: *Deleted

## 2013-02-20 DIAGNOSIS — E785 Hyperlipidemia, unspecified: Secondary | ICD-10-CM

## 2013-02-20 DIAGNOSIS — R5381 Other malaise: Secondary | ICD-10-CM

## 2013-02-20 DIAGNOSIS — D649 Anemia, unspecified: Secondary | ICD-10-CM

## 2013-03-04 ENCOUNTER — Ambulatory Visit: Payer: Self-pay | Admitting: Nurse Practitioner

## 2013-03-05 ENCOUNTER — Ambulatory Visit: Payer: Self-pay | Admitting: Nurse Practitioner

## 2013-03-07 ENCOUNTER — Ambulatory Visit: Payer: Self-pay | Admitting: Nurse Practitioner

## 2013-03-13 ENCOUNTER — Other Ambulatory Visit: Payer: Self-pay | Admitting: Internal Medicine

## 2013-03-14 ENCOUNTER — Ambulatory Visit (INDEPENDENT_AMBULATORY_CARE_PROVIDER_SITE_OTHER): Payer: Medicare Other | Admitting: Nurse Practitioner

## 2013-03-14 ENCOUNTER — Encounter: Payer: Self-pay | Admitting: Nurse Practitioner

## 2013-03-14 VITALS — BP 118/72 | HR 70 | Temp 97.7°F | Resp 16 | Ht 64.0 in | Wt 139.0 lb

## 2013-03-14 DIAGNOSIS — M1710 Unilateral primary osteoarthritis, unspecified knee: Secondary | ICD-10-CM

## 2013-03-14 DIAGNOSIS — D649 Anemia, unspecified: Secondary | ICD-10-CM

## 2013-03-14 DIAGNOSIS — E785 Hyperlipidemia, unspecified: Secondary | ICD-10-CM | POA: Insufficient documentation

## 2013-03-14 NOTE — Progress Notes (Signed)
Patient ID: Meredith Moore, female   DOB: 01-27-1936, 77 y.o.   MRN: [id number]   Allergies  Allergen Reactions  . Formaldehyde Rash  . Nickel Rash    Chief Complaint  Patient presents with  . Follow-up    HPI: Patient is a 77 y.o. female seen in the office today for routine follow up HYPERLIPIDEMIA The patient's most recent LDL was near goal.  No complications noted from the medication presently being used. MACULAR DEGENERATION  follows with ophthal. Uses  eye drops  GLAUCOMA  follows with ophthal with eye drops  OSTEOARTHRITIS The arthritis is stable. S/p total knee replacement- no rt knee pain  Having a hard time sleeping after surgery- taking Restoril however due to loss of vision and advanced glaucoma and macular degeneration therefore she stopped this and is taking melatonin which helps  Would like moles removed at next visit  Review of Systems:  Review of Systems  Constitutional: Negative for malaise/fatigue.  HENT: Negative for neck pain.   Eyes:       Has advanced macular degeneration and glaucoma Can no longer read and needs lights  Can not see the TV now  Unable to see peripherally due to glaucoma and macular degeneration makes it as if she is looking through wax paper   Respiratory: Negative for cough, sputum production and shortness of breath.   Cardiovascular: Negative for chest pain and palpitations.  Gastrointestinal: Negative for heartburn, abdominal pain, diarrhea and constipation.  Genitourinary: Negative for dysuria, urgency and frequency.  Musculoskeletal: Negative for myalgias and back pain.  Skin: Negative.   Neurological: Negative for dizziness, weakness and headaches.  Psychiatric/Behavioral: Negative for depression. The patient has insomnia. The patient is not nervous/anxious.      Past Medical History  Diagnosis Date  . Arthritis   . Glaucoma   . Macular degeneration   . Osteoarthrosis, unspecified whether generalized or localized,  unspecified site   . Pain in limb   . Herpes zoster with unspecified complication   . Allergic rhinitis due to pollen   . Anemia, unspecified   . Other and unspecified hyperlipidemia   . Other malaise and fatigue    Past Surgical History  Procedure Laterality Date  . Tonsillectomy      1945   . Ovarian tumor       benign tumor removed   . Appendectomy    . Total knee arthroplasty Right 09/10/2012    Procedure: RIGHT TOTAL KNEE ARTHROPLASTY;  Surgeon: Loanne Drilling, MD;  Location: [facility];  Service: Orthopedics;  Laterality: Right;   Social History:   reports that she quit smoking about 24 years ago. She has never used smokeless tobacco. She reports that she does not drink alcohol or use illicit drugs.  No family history on file.  Medications: Patient's Medications  New Prescriptions   No medications on file  Previous Medications   ASPIRIN 81 MG TABLET    Take 81 mg by mouth daily.   ATORVASTATIN (LIPITOR) 10 MG TABLET    TAKE ONE-HALF TABLET BY MOUTH ONCE DAILY AT BEDTIME FOR CHOLESTEROL   DOXYLAMINE, SLEEP, (UNISOM) 25 MG TABLET    Take 25 mg by mouth at bedtime as needed for sleep.   KETOROLAC (ACULAR) 0.5 % OPHTHALMIC SOLUTION    Place 1 drop into the right eye 3 (three) times daily.   LATANOPROST (XALATAN) 0.005 % OPHTHALMIC SOLUTION    Place 1 drop into both eyes at bedtime.   OMEGA-3 FATTY ACIDS (FISH  OIL) 1200 MG CAPS    Take 1,200 mg by mouth daily.   SPECIALTY VITAMINS PRODUCTS (ICAPS LUTEIN-ZEAXANTHIN PO)    Take 6 mg by mouth daily.   TIMOLOL (TIMOPTIC) 0.5 % OPHTHALMIC SOLUTION    Place 1 drop into both eyes 2 (two) times daily.  Modified Medications   No medications on file  Discontinued Medications   METHOCARBAMOL (ROBAXIN) 500 MG TABLET    Take 1 tablet (500 mg total) by mouth every 6 (six) hours as needed.   OXYCODONE (OXY IR/ROXICODONE) 5 MG IMMEDIATE RELEASE TABLET    Take 1-2 tablets (5-10 mg total) by mouth every 3 (three) hours as needed.   RIVAROXABAN  (XARELTO) 10 MG TABS TABLET    Take 1 tablet (10 mg total) by mouth daily with breakfast.   TRAMADOL (ULTRAM) 50 MG TABLET    Take 1-2 tablets (50-100 mg total) by mouth every 6 (six) hours as needed.     Physical Exam:  Filed Vitals:   03/14/13 0828  BP: 118/72  Pulse: 70  Temp: 97.7 F (36.5 C)  TempSrc: Oral  Resp: 16  Height: 5\' 4"  (1.626 m)  Weight: 139 lb (63.05 kg)  SpO2: 94%    Physical Exam  Constitutional: She is oriented to person, place, and time and well-developed, well-nourished, and in no distress. No distress.  HENT:  Head: Normocephalic and atraumatic.  Cardiovascular: Normal rate, regular rhythm and normal heart sounds.   Pulmonary/Chest: Effort normal and breath sounds normal. No respiratory distress.  Abdominal: Soft. Bowel sounds are normal. She exhibits no distension.  Musculoskeletal: Normal range of motion. She exhibits no edema.  Neurological: She is alert and oriented to person, place, and time.  Skin: Skin is warm and dry. She is not diaphoretic. No erythema.  Psychiatric: Affect normal.    Labs reviewed: Basic Metabolic Panel:  Recent Labs  78/29/56 1040 09/11/12 0420 09/12/12 0407  NA 136 136 135  K 4.4 3.9 3.4*  CL 100 104 104  CO2 27 24 24   GLUCOSE 96 150* 137*  BUN 16 10 9   CREATININE 0.88 0.73 0.76  CALCIUM 9.7 8.0* 7.9*   Liver Function Tests:  Recent Labs  08/30/12 1040  AST 18  ALT 13  ALKPHOS 65  BILITOT 0.3  PROT 6.9  ALBUMIN 3.7   No results found for this basename: LIPASE, AMYLASE,  in the last 8760 hours No results found for this basename: AMMONIA,  in the last 8760 hours CBC:  Recent Labs  09/11/12 0420 09/12/12 0407 09/13/12 0405  WBC 9.0 10.4 8.9  HGB 8.1* 7.0* 9.2*  HCT 24.2* 20.9* 27.5*  MCV 91.3 92.1 88.7  PLT 204 193 176   Lipid Panel: No results found for this basename: CHOL, HDL, LDLCALC, TRIG, CHOLHDL, LDLDIRECT,  in the last 8760 hours  Past Procedures:     Assessment/Plan 1.  Other and unspecified hyperlipidemia Will cont current medication follow up labs before next visit - Comprehensive metabolic panel; Future - Lipid panel; Future  2. Anemia After post op- will follow up to make sure cbc is stable - CBC With differential/Platelet; Future  3. Osteoarthrosis of the knee Stable; Currently without pain   Will follow up in 6 months for EV with fasting lipids before visit

## 2013-03-14 NOTE — Patient Instructions (Addendum)
Follow up in 6 months for EV with fasting blood work before visit

## 2013-04-11 ENCOUNTER — Ambulatory Visit (INDEPENDENT_AMBULATORY_CARE_PROVIDER_SITE_OTHER): Payer: Medicare Other

## 2013-04-11 DIAGNOSIS — Z23 Encounter for immunization: Secondary | ICD-10-CM

## 2013-04-30 ENCOUNTER — Ambulatory Visit (INDEPENDENT_AMBULATORY_CARE_PROVIDER_SITE_OTHER): Payer: Medicare Other | Admitting: Nurse Practitioner

## 2013-04-30 ENCOUNTER — Encounter: Payer: Self-pay | Admitting: Nurse Practitioner

## 2013-04-30 VITALS — BP 126/82 | HR 71 | Temp 98.4°F | Resp 14 | Ht 64.0 in | Wt 140.8 lb

## 2013-04-30 DIAGNOSIS — L819 Disorder of pigmentation, unspecified: Secondary | ICD-10-CM

## 2013-04-30 NOTE — Progress Notes (Signed)
Patient ID: Meredith Moore, female   DOB: February 23, 1936, 77 y.o.   MRN: [id number]   Allergies  Allergen Reactions  . Formaldehyde Rash  . Nickel Rash    Chief Complaint  Patient presents with  . Spot on arm and its getting bigger    HPI: Patient is a 77 y.o. female seen in the office today for a spot on her arm; noticed this 3 weeks ago and due to poor vision would like it looked at today; unsure if she bumped her arm and caused spot or was a burn Does not cause any pain or discomfort Review of Systems:  Review of Systems  Constitutional: Negative for weight loss and malaise/fatigue.  Eyes:       Has advanced macular degeneration and glaucoma  Respiratory: Negative for shortness of breath.   Cardiovascular: Negative for chest pain.  Skin: Negative for itching and rash.  Neurological: Negative for weakness.     Past Medical History  Diagnosis Date  . Arthritis   . Glaucoma   . Macular degeneration   . Osteoarthrosis, unspecified whether generalized or localized, unspecified site   . Pain in limb   . Herpes zoster with unspecified complication   . Allergic rhinitis due to pollen   . Anemia, unspecified   . Other and unspecified hyperlipidemia   . Other malaise and fatigue    Past Surgical History  Procedure Laterality Date  . Tonsillectomy      1945   . Ovarian tumor       benign tumor removed   . Appendectomy    . Total knee arthroplasty Right 09/10/2012    Procedure: RIGHT TOTAL KNEE ARTHROPLASTY;  Surgeon: Loanne Drilling, MD;  Location: [facility];  Service: Orthopedics;  Laterality: Right;   Social History:   reports that she quit smoking about 24 years ago. She has never used smokeless tobacco. She reports that she does not drink alcohol or use illicit drugs.  No family history on file.  Medications: Patient's Medications  New Prescriptions   No medications on file  Previous Medications   ASPIRIN 81 MG TABLET    Take 81 mg by mouth daily.   ATORVASTATIN  (LIPITOR) 10 MG TABLET    TAKE ONE-HALF TABLET BY MOUTH ONCE DAILY AT BEDTIME FOR CHOLESTEROL   CALCIUM CARBONATE-VIT D-MIN (CALCIUM 1200 PO)    Take one tablet by mouth once daily   DOXYLAMINE, SLEEP, (UNISOM) 25 MG TABLET    Take 25 mg by mouth at bedtime as needed for sleep.   KETOROLAC (ACULAR) 0.5 % OPHTHALMIC SOLUTION    Place 1 drop into the right eye 3 (three) times daily.   LATANOPROST (XALATAN) 0.005 % OPHTHALMIC SOLUTION    Place 1 drop into both eyes at bedtime.   OMEGA-3 FATTY ACIDS (FISH OIL) 1200 MG CAPS    Take 1,200 mg by mouth daily.   SPECIALTY VITAMINS PRODUCTS (ICAPS LUTEIN-ZEAXANTHIN PO)    Take 6 mg by mouth daily.   TIMOLOL (TIMOPTIC) 0.5 % OPHTHALMIC SOLUTION    Place 1 drop into both eyes 2 (two) times daily.  Modified Medications   No medications on file  Discontinued Medications   No medications on file     Physical Exam:  Filed Vitals:   04/30/13 1012  BP: 126/82  Pulse: 71  Temp: 98.4 F (36.9 C)  TempSrc: Oral  Resp: 14  Height: 5\' 4"  (1.626 m)  Weight: 140 lb 12.8 oz (63.866 kg)   Physical Exam  Constitutional: She is well-developed, well-nourished, and in no distress. No distress.  Cardiovascular: Normal rate, regular rhythm and normal heart sounds.   Pulmonary/Chest: Effort normal and breath sounds normal.  Skin: Skin is warm and dry. No rash noted. She is not diaphoretic. No erythema.  0.5 cm x 0.5cm light brown hyperpigmentation to skin with single pinpoint flat senile angiomas     Labs reviewed: Basic Metabolic Panel:  Recent Labs  16/10/96 1040 09/11/12 0420 09/12/12 0407  NA 136 136 135  K 4.4 3.9 3.4*  CL 100 104 104  CO2 27 24 24   GLUCOSE 96 150* 137*  BUN 16 10 9   CREATININE 0.88 0.73 0.76  CALCIUM 9.7 8.0* 7.9*   Liver Function Tests:  Recent Labs  08/30/12 1040  AST 18  ALT 13  ALKPHOS 65  BILITOT 0.3  PROT 6.9  ALBUMIN 3.7   No results found for this basename: LIPASE, AMYLASE,  in the last 8760 hours No  results found for this basename: AMMONIA,  in the last 8760 hours CBC:  Recent Labs  09/11/12 0420 09/12/12 0407 09/13/12 0405  WBC 9.0 10.4 8.9  HGB 8.1* 7.0* 9.2*  HCT 24.2* 20.9* 27.5*  MCV 91.3 92.1 88.7  PLT 204 193 176      Assessment/Plan 1. Other dyschromia - pt has multiple Senile angiomas  throughout body this is another similar spot -Benign and no intervention needed at this time; will cont to monitor

## 2013-07-04 LAB — HM MAMMOGRAPHY: HM Mammogram: NEGATIVE

## 2013-07-29 ENCOUNTER — Encounter: Payer: Self-pay | Admitting: Internal Medicine

## 2013-09-09 ENCOUNTER — Other Ambulatory Visit: Payer: Self-pay | Admitting: Nurse Practitioner

## 2013-09-13 ENCOUNTER — Other Ambulatory Visit: Payer: Medicare Other

## 2013-09-13 DIAGNOSIS — D649 Anemia, unspecified: Secondary | ICD-10-CM

## 2013-09-13 DIAGNOSIS — E785 Hyperlipidemia, unspecified: Secondary | ICD-10-CM

## 2013-09-14 LAB — CBC WITH DIFFERENTIAL
Basophils Absolute: 0 10*3/uL (ref 0.0–0.2)
Basos: 1 %
EOS: 3 %
Eosinophils Absolute: 0.2 10*3/uL (ref 0.0–0.4)
HCT: 37.2 % (ref 34.0–46.6)
Hemoglobin: 12.3 g/dL (ref 11.1–15.9)
IMMATURE GRANS (ABS): 0 10*3/uL (ref 0.0–0.1)
IMMATURE GRANULOCYTES: 0 %
Lymphocytes Absolute: 2.3 10*3/uL (ref 0.7–3.1)
Lymphs: 38 %
MCH: 29.9 pg (ref 26.6–33.0)
MCHC: 33.1 g/dL (ref 31.5–35.7)
MCV: 91 fL (ref 79–97)
MONOCYTES: 8 %
MONOS ABS: 0.5 10*3/uL (ref 0.1–0.9)
NEUTROS PCT: 50 %
Neutrophils Absolute: 3 10*3/uL (ref 1.4–7.0)
PLATELETS: 290 10*3/uL (ref 150–379)
RBC: 4.11 x10E6/uL (ref 3.77–5.28)
RDW: 13.6 % (ref 12.3–15.4)
WBC: 6 10*3/uL (ref 3.4–10.8)

## 2013-09-14 LAB — COMPREHENSIVE METABOLIC PANEL
ALT: 17 IU/L (ref 0–32)
AST: 20 IU/L (ref 0–40)
Albumin/Globulin Ratio: 2.4 (ref 1.1–2.5)
Albumin: 4.5 g/dL (ref 3.5–4.8)
Alkaline Phosphatase: 65 IU/L (ref 39–117)
BUN/Creatinine Ratio: 14 (ref 11–26)
BUN: 14 mg/dL (ref 8–27)
CALCIUM: 10 mg/dL (ref 8.7–10.3)
CHLORIDE: 102 mmol/L (ref 97–108)
CO2: 25 mmol/L (ref 18–29)
CREATININE: 1 mg/dL (ref 0.57–1.00)
GFR calc Af Amer: 64 mL/min/{1.73_m2} (ref >59)
GFR calc non Af Amer: 55 mL/min/{1.73_m2} — ABNORMAL LOW (ref >59)
GLOBULIN, TOTAL: 1.9 g/dL (ref 1.5–4.5)
Glucose: 92 mg/dL (ref 65–99)
Potassium: 5.2 mmol/L (ref 3.5–5.2)
SODIUM: 142 mmol/L (ref 134–144)
Total Bilirubin: 0.3 mg/dL (ref 0.0–1.2)
Total Protein: 6.4 g/dL (ref 6.0–8.5)

## 2013-09-14 LAB — LIPID PANEL
CHOLESTEROL TOTAL: 192 mg/dL (ref 100–199)
Chol/HDL Ratio: 2.6 ratio units (ref 0.0–4.4)
HDL: 73 mg/dL (ref >39)
LDL Calculated: 97 mg/dL (ref 0–99)
TRIGLYCERIDES: 111 mg/dL (ref 0–149)
VLDL Cholesterol Cal: 22 mg/dL (ref 5–40)

## 2013-09-17 ENCOUNTER — Encounter: Payer: Self-pay | Admitting: Nurse Practitioner

## 2013-09-18 ENCOUNTER — Encounter: Payer: Self-pay | Admitting: Nurse Practitioner

## 2013-09-18 ENCOUNTER — Ambulatory Visit (INDEPENDENT_AMBULATORY_CARE_PROVIDER_SITE_OTHER): Payer: Medicare Other | Admitting: Nurse Practitioner

## 2013-09-18 VITALS — BP 120/72 | HR 98 | Temp 97.5°F | Resp 14 | Ht 64.75 in | Wt 145.4 lb

## 2013-09-18 DIAGNOSIS — M899 Disorder of bone, unspecified: Secondary | ICD-10-CM

## 2013-09-18 DIAGNOSIS — Z0000 Encounter for general adult medical examination without abnormal findings: Secondary | ICD-10-CM

## 2013-09-18 DIAGNOSIS — M1710 Unilateral primary osteoarthritis, unspecified knee: Secondary | ICD-10-CM

## 2013-09-18 DIAGNOSIS — M8580 Other specified disorders of bone density and structure, unspecified site: Secondary | ICD-10-CM

## 2013-09-18 DIAGNOSIS — H3530 Unspecified macular degeneration: Secondary | ICD-10-CM | POA: Insufficient documentation

## 2013-09-18 DIAGNOSIS — M949 Disorder of cartilage, unspecified: Secondary | ICD-10-CM

## 2013-09-18 DIAGNOSIS — IMO0002 Reserved for concepts with insufficient information to code with codable children: Secondary | ICD-10-CM

## 2013-09-18 DIAGNOSIS — M179 Osteoarthritis of knee, unspecified: Secondary | ICD-10-CM

## 2013-09-18 DIAGNOSIS — E785 Hyperlipidemia, unspecified: Secondary | ICD-10-CM

## 2013-09-18 NOTE — Progress Notes (Signed)
Passed clock test. DLM

## 2013-09-18 NOTE — Progress Notes (Signed)
Patient ID: Meredith Moore, female   DOB: 09-13-1935, 78 y.o.   MRN: [id number]    Allergies  Allergen Reactions  . Formaldehyde Rash  . Nickel Rash    Chief Complaint  Patient presents with  . Annual Exam    Physical with labs  (printed) (last CPE 03/14/12)  . other    Question on if she could donate a kidney to a friend with end-stage kidney disease, and look at mole under breast.    HPI: Patient is a 78 y.o. female seen in the office today for extended visit Vision conts to get worse, following up with ophthalmology  every 4 months Right retina has atrophied completely and Left has started (no vision in right) Has talking book, getting currency reader-- still in good spirits  Dexa- in Nov 2012 mammogram - dec 2014 Colonoscopy- unsure of when her last one was but she knows she is up to date Vaccines up to date Dentist- sees yearly  Review of Systems:  Review of Systems  Constitutional: Negative for fever, chills and malaise/fatigue.  HENT: Negative for congestion, ear pain, sore throat and tinnitus.   Eyes:       Has advanced macular degeneration and glaucoma  Respiratory: Negative for cough, sputum production and shortness of breath.   Cardiovascular: Negative for chest pain and palpitations.  Gastrointestinal: Negative for heartburn, abdominal pain, diarrhea and constipation.  Genitourinary: Negative for dysuria, urgency and frequency.  Musculoskeletal: Negative for back pain, falls, joint pain, myalgias and neck pain.  Skin: Negative.   Neurological: Negative for dizziness, tingling, sensory change, speech change, weakness and headaches.  Psychiatric/Behavioral: Negative for depression. The patient is not nervous/anxious and does not have insomnia.      Past Medical History  Diagnosis Date  . Arthritis   . Glaucoma   . Macular degeneration   . Osteoarthrosis, unspecified whether generalized or localized, unspecified site   . Pain in limb   . Herpes zoster with  unspecified complication   . Allergic rhinitis due to pollen   . Anemia, unspecified   . Other and unspecified hyperlipidemia   . Other malaise and fatigue    Past Surgical History  Procedure Laterality Date  . Tonsillectomy      1945   . Ovarian tumor       benign tumor removed   . Appendectomy    . Total knee arthroplasty Right 09/10/2012    Procedure: RIGHT TOTAL KNEE ARTHROPLASTY;  Surgeon: Gearlean Alf, MD;  Location: [facility];  Service: Orthopedics;  Laterality: Right;   Social History:   reports that she quit smoking about 25 years ago. She has never used smokeless tobacco. She reports that she does not drink alcohol or use illicit drugs.  History reviewed. No pertinent family history.  Medications: Patient's Medications  New Prescriptions   No medications on file  Previous Medications   ATORVASTATIN (LIPITOR) 10 MG TABLET    TAKE ONE-HALF TABLET BY MOUTH AT BEDTIME FOR CHOLESTEROL   CALCIUM CARBONATE-VIT D-MIN (CALCIUM 1200 PO)    Take one tablet by mouth once daily   DOXYLAMINE, SLEEP, (UNISOM) 25 MG TABLET    Take 25 mg by mouth at bedtime as needed for sleep.   KETOROLAC (ACULAR) 0.5 % OPHTHALMIC SOLUTION    Place 1 drop into the right eye 3 (three) times daily.   LATANOPROST (XALATAN) 0.005 % OPHTHALMIC SOLUTION    Place 1 drop into both eyes at bedtime.   OMEGA-3 FATTY ACIDS (FISH  OIL) 1200 MG CAPS    Take 1,200 mg by mouth daily.   SPECIALTY VITAMINS PRODUCTS (ICAPS LUTEIN-ZEAXANTHIN PO)    Take 6 mg by mouth daily.   TIMOLOL (TIMOPTIC) 0.5 % OPHTHALMIC SOLUTION    Place 1 drop into both eyes 2 (two) times daily.  Modified Medications   No medications on file  Discontinued Medications   ASPIRIN 81 MG TABLET    Take 81 mg by mouth daily.     Physical Exam:  Filed Vitals:   09/18/13 1126  BP: 120/72  Pulse: 98  Temp: 97.5 F (36.4 C)  TempSrc: Oral  Resp: 14  Height: 5' 4.75" (1.645 m)  Weight: 145 lb 6.4 oz (65.953 kg)  SpO2: 99%    Physical Exam    Constitutional: She is oriented to person, place, and time and well-developed, well-nourished, and in no distress. No distress.  HENT:  Head: Normocephalic and atraumatic.  Right Ear: External ear normal.  Left Ear: External ear normal.  Nose: Nose normal.  Mouth/Throat: Oropharynx is clear and moist. No oropharyngeal exudate.  Eyes: Conjunctivae are normal.  Neck: Normal range of motion. Neck supple. No JVD present. No thyromegaly present.  Cardiovascular: Normal rate, regular rhythm, normal heart sounds and intact distal pulses.   Pulmonary/Chest: Effort normal and breath sounds normal. No respiratory distress.  Small seborrheic keratosis under left breast; not bothering her at this time, will not remove  Abdominal: Soft. Bowel sounds are normal. She exhibits no distension.  Musculoskeletal: Normal range of motion. She exhibits no edema.  Lymphadenopathy:    She has no cervical adenopathy.  Neurological: She is alert and oriented to person, place, and time. Gait normal.  Skin: Skin is warm and dry. She is not diaphoretic. No erythema.  Psychiatric: Affect normal.     Labs reviewed: Basic Metabolic Panel:  Recent Labs  09/13/13 0838  NA 142  K 5.2  CL 102  CO2 25  GLUCOSE 92  BUN 14  CREATININE 1.00  CALCIUM 10.0   Liver Function Tests:  Recent Labs  09/13/13 0838  AST 20  ALT 17  ALKPHOS 65  BILITOT 0.3  PROT 6.4   No results found for this basename: LIPASE, AMYLASE,  in the last 8760 hours No results found for this basename: AMMONIA,  in the last 8760 hours CBC:  Recent Labs  09/13/13 0838  WBC 6.0  NEUTROABS 3.0  HGB 12.3  HCT 37.2  MCV 91  PLT 290   Lipid Panel:  Recent Labs  09/13/13 0838  HDL 73  LDLCALC 97  TRIG 111  CHOLHDL 2.6    Assessment/Plan 1. OA (osteoarthritis) of knee -stable; without pain since surgery  2. Other and unspecified hyperlipidemia -LDL at goal; conts on fish oil and lipitor  3. Macular  degeneration -advanced with glaucoma, has family support, has devices to help her with her ADLs  -does well at home   4. Osteopenia -conts on calcium and vit D -dexa scan 2 years ago  5. Routine general medical examination at a health care facility PREVENTIVE COUNSELING:  The patient was counseled regarding the appropriate use of alcohol, regular self-examination of the breasts on a monthly basis, prevention of dental and periodontal disease, diet, regular sustained exercise for at least 30 minutes 3-4 times per week, routine screening interval for mammogram as recommended by the [place] and ACOG, the proper use of sunscreen and protective clothing, tobacco use,  and recommended schedule for GI hemoccult testing,  colonoscopy, cholesterol, thyroid and diabetes screening. -lab work discusses without questions -does not feel like she would be a good candidate to give a kidney- I agree  -up to date on vaccines, colonoscopy, mammogram, and dexa scan  MMSE of 30/30 on today's visit To follow up in 1 year

## 2013-10-30 ENCOUNTER — Ambulatory Visit: Payer: Medicare Other | Attending: Specialist | Admitting: Occupational Therapy

## 2013-10-30 DIAGNOSIS — M949 Disorder of cartilage, unspecified: Secondary | ICD-10-CM

## 2013-10-30 DIAGNOSIS — IMO0001 Reserved for inherently not codable concepts without codable children: Secondary | ICD-10-CM | POA: Insufficient documentation

## 2013-10-30 DIAGNOSIS — M899 Disorder of bone, unspecified: Secondary | ICD-10-CM | POA: Insufficient documentation

## 2013-10-30 DIAGNOSIS — E785 Hyperlipidemia, unspecified: Secondary | ICD-10-CM | POA: Insufficient documentation

## 2013-10-30 DIAGNOSIS — IMO0002 Reserved for concepts with insufficient information to code with codable children: Secondary | ICD-10-CM | POA: Insufficient documentation

## 2013-10-30 DIAGNOSIS — H3530 Unspecified macular degeneration: Secondary | ICD-10-CM | POA: Insufficient documentation

## 2013-10-30 DIAGNOSIS — M1710 Unilateral primary osteoarthritis, unspecified knee: Secondary | ICD-10-CM | POA: Insufficient documentation

## 2013-11-13 ENCOUNTER — Other Ambulatory Visit: Payer: Self-pay | Admitting: *Deleted

## 2013-11-13 MED ORDER — ATORVASTATIN CALCIUM 10 MG PO TABS
ORAL_TABLET | ORAL | Status: DC
Start: 2013-11-13 — End: 2014-03-19

## 2013-11-13 NOTE — Telephone Encounter (Signed)
Patient request

## 2014-02-11 ENCOUNTER — Other Ambulatory Visit: Payer: Self-pay | Admitting: Obstetrics & Gynecology

## 2014-02-11 DIAGNOSIS — N852 Hypertrophy of uterus: Secondary | ICD-10-CM

## 2014-02-11 DIAGNOSIS — N858 Other specified noninflammatory disorders of uterus: Secondary | ICD-10-CM

## 2014-02-14 ENCOUNTER — Ambulatory Visit
Admission: RE | Admit: 2014-02-14 | Discharge: 2014-02-14 | Disposition: A | Discharge status: Home / Self Care | Payer: Medicare Other | Source: Ambulatory Visit | Attending: Obstetrics & Gynecology | Admitting: Obstetrics & Gynecology

## 2014-02-14 DIAGNOSIS — N852 Hypertrophy of uterus: Secondary | ICD-10-CM

## 2014-02-14 DIAGNOSIS — N858 Other specified noninflammatory disorders of uterus: Secondary | ICD-10-CM

## 2014-02-27 ENCOUNTER — Ambulatory Visit: Payer: Medicare Other | Attending: Gynecologic Oncology | Admitting: Gynecologic Oncology

## 2014-02-27 ENCOUNTER — Encounter: Payer: Self-pay | Admitting: Gynecologic Oncology

## 2014-02-27 VITALS — BP 141/81 | HR 73 | Temp 98.6°F | Resp 18 | Ht 63.5 in | Wt 142.3 lb

## 2014-02-27 DIAGNOSIS — E785 Hyperlipidemia, unspecified: Secondary | ICD-10-CM | POA: Diagnosis not present

## 2014-02-27 DIAGNOSIS — N9489 Other specified conditions associated with female genital organs and menstrual cycle: Secondary | ICD-10-CM | POA: Insufficient documentation

## 2014-02-27 DIAGNOSIS — H548 Legal blindness, as defined in USA: Secondary | ICD-10-CM | POA: Diagnosis not present

## 2014-02-27 DIAGNOSIS — N852 Hypertrophy of uterus: Secondary | ICD-10-CM | POA: Diagnosis not present

## 2014-02-27 DIAGNOSIS — H3530 Unspecified macular degeneration: Secondary | ICD-10-CM | POA: Diagnosis not present

## 2014-02-27 DIAGNOSIS — Z79899 Other long term (current) drug therapy: Secondary | ICD-10-CM | POA: Insufficient documentation

## 2014-02-27 DIAGNOSIS — Z87891 Personal history of nicotine dependence: Secondary | ICD-10-CM | POA: Insufficient documentation

## 2014-02-27 DIAGNOSIS — N858 Other specified noninflammatory disorders of uterus: Secondary | ICD-10-CM | POA: Insufficient documentation

## 2014-02-27 NOTE — Patient Instructions (Signed)
Your surgery is scheduled for March 25, 2014 with Dr. Denman George                Preparing for your Surgery  Pre-operative Testing -You will receive a phone call from presurgical testing at [facility] to arrange for a pre-operative testing appointment before your surgery.  This appointment normally occurs one to two weeks before your scheduled surgery.   -Bring your insurance card, copy of an advanced directive if applicable, medication list  -At that visit, you will be asked to sign a consent for a possible blood transfusion in case a transfusion becomes necessary during surgery.  The need for a blood transfusion is rare but having consent is a necessary part of your care.     Day Before Surgery at [facility] will be asked to take in only clear liquids the day before surgery.  Examples of clear liquids include broths, jello, and clear juices.  You may also be advised to perform a Miralax bowel prep or fleets enema the night before your surgery based off of your provider's recommendations.  You will be advised to have nothing to eat or drink after midnight the evening before.    Your role in recovery Your role is to become active as soon as directed by your doctor, while still giving yourself time to heal.  Rest when you feel tired. You will be asked to do the following in order to speed your recovery:  - Cough and breathe deeply. This helps toclear and expand your lungs and can prevent pneumonia. You may be given a spirometer to practice deep breathing. A staff member will show you how to use the spirometer. - Do mild physical activity. Walking or moving your legs help your circulation and body functions return to normal. A staff member will help you when you try to walk and will provide you with simple exercises. Do not try to get up or walk alone the first time. - Actively manage your pain. Managing your pain lets you move in comfort. We will ask you to rate your pain on a scale of zero to  10. It is your responsibility to tell your doctor or nurse where and how much you hurt so your pain can be treated.  Special Considerations -If you are diabetic, you may be placed on insulin after surgery to have closer control over your blood sugars to promote healing and recovery.  This does not mean that you will be discharged on insulin.  If applicable, your oral antidiabetics will be resumed when you are tolerating a solid diet.  -Your final pathology results from surgery should be available by the Friday after surgery and the results will be relayed to you when available.  Hysterectomy Information  A hysterectomy is a surgery in which your uterus is removed. This surgery may be done to treat various medical problems. After the surgery, you will no longer have menstrual periods. The surgery will also make you unable to become pregnant (sterile). The fallopian tubes and ovaries can be removed (bilateral salpingo-oophorectomy) during this surgery as well.  REASONS FOR A HYSTERECTOMY  Persistent, abnormal bleeding.  Lasting (chronic) pelvic pain or infection.  The lining of the uterus (endometrium) starts growing outside the uterus (endometriosis).  The endometrium starts growing in the muscle of the uterus (adenomyosis).  The uterus falls down into the vagina (pelvic organ prolapse).  Noncancerous growths in the uterus (uterine fibroids) that cause symptoms.  Precancerous cells.  Cervical  cancer or uterine cancer. TYPES OF HYSTERECTOMIES  Supracervical hysterectomy--In this type, the top part of the uterus is removed, but not the cervix.  Total hysterectomy--The uterus and cervix are removed.  Radical hysterectomy--The uterus, the cervix, and the fibrous tissue that holds the uterus in place in the pelvis (parametrium) are removed. WAYS A HYSTERECTOMY CAN BE PERFORMED  Abdominal hysterectomy--A large surgical cut (incision) is made in the abdomen. The uterus is removed through  this incision.  Vaginal hysterectomy--An incision is made in the vagina. The uterus is removed through this incision. There are no abdominal incisions.  Conventional laparoscopic hysterectomy--Three or four small incisions are made in the abdomen. A thin, lighted tube with a camera (laparoscope) is inserted into one of the incisions. Other tools are put through the other incisions. The uterus is cut into small pieces. The small pieces are removed through the incisions, or they are removed through the vagina.  Laparoscopically assisted vaginal hysterectomy (LAVH)--Three or four small incisions are made in the abdomen. Part of the surgery is performed laparoscopically and part vaginally. The uterus is removed through the vagina.  Robot-assisted laparoscopic hysterectomy--A laparoscope and other tools are inserted into 3 or 4 small incisions in the abdomen. A computer-controlled device is used to give the surgeon a 3D image and to help control the surgical instruments. This allows for more precise movements of surgical instruments. The uterus is cut into small pieces and removed through the incisions or removed through the vagina. RISKS AND COMPLICATIONS  Possible complications associated with this procedure include:  Bleeding and risk of blood transfusion. Tell your health care provider if you do not want to receive any blood products.  Blood clots in the legs or lung.  Infection.  Injury to surrounding organs.  Problems or side effects related to anesthesia.  Conversion to an abdominal hysterectomy from one of the other techniques. WHAT TO EXPECT AFTER A HYSTERECTOMY  You will be given pain medicine.  You will need to have someone with you for the first 3-5 days after you go home.  You will need to follow up with your surgeon in 2-4 weeks after surgery to evaluate your progress.  You may have early menopause symptoms such as hot flashes, night sweats, and insomnia.  If you had a  hysterectomy for a problem that was not cancer or not a condition that could lead to cancer, then you no longer need Pap tests. However, even if you no longer need a Pap test, a regular exam is a good idea to make sure no other problems are starting. Document Released: 01/11/2001 Document Revised: 05/08/2013 Document Reviewed: 03/25/2013 [facility] Patient Information 2015 [employer], [place]. This information is not intended to replace advice given to you by your health care provider. Make sure you discuss any questions you have with your health care provider.

## 2014-02-27 NOTE — Progress Notes (Signed)
Consult Note: Gyn-Onc  Consult was requested by Dr. Benjie Karvonen for the evaluation of Meredith Moore 78 y.o. female with a complex uterine mass  CC:  Chief Complaint  Patient presents with  . Enlarged Uterus    Assessment/Plan:  Ms. Meredith Moore  is a 78 y.o.  year old legally blind woman with a 6.3 x 4.7 x 7.0 cm complex cystic and solid well-circumscribed mass within the anterior wall of the uterus. This mass is asymptomatic and was detected incidentally on an MRI performed to evaluate her right hip prior to hip replacement (which is scheduled for November 2015). I personally reviewed the images and it appears well-circumscribed on MRI, without evidence for her infiltration into the surrounding myometrium or pelvic structures.  Overall have a relatively low suspicion of sarcoma given her symptom profile (or lack thereof), and MRI appearance. However I discussed with the patient that the only definitive way to rule out sarcoma is with hysterectomy and pathology evaluation of the specimen. Given that she is a thin healthy woman in a good surgical candidate I believe this is a reasonable approach.  Again she would be a good candidate for a robotic-assisted total hysterectomy with bilateral salpingo-oophorectomy. We will send a specimen for frozen section at the time of surgery. I discussed surgical risk which includes:  bleeding, infection, damage to internal organs (such as bladder,ureters, bowels), blood clot, reoperation and rehospitalization. I discussed that conversion to a mini laparotomy may be necessary if her uterine specimen cannot be delivered intact vaginally (however on today's examination anticipate that this will be possible). We discussed the surgical route, anticipated hospital stay, and postoperative recovery. I believe that she'll be fully recovered from the planned hip replacement in November 2015.    HPI:  Ms. Meredith Moore is a 78 year old woman who is vision impaired (legally  blind) who has a history of severe lower stream he arthritis including a prior knee replacement. She was undergoing workup for severe right hip pain and a planned right hip arthroplasty, and as part of this evaluation she underwent an MRI of the right hip. This MRI revealed the incidental finding of a 7 cm anterior myometrial mass with complex echogenicity and cystic areas within it. This raised concern in a 78 year old woman for possible sarcoma. She has no known history of uterine fibroids.  Ms. Meredith Moore   has a history of primary infertility, and a minilaparotomy prior low transverse incision for right ovarian cystectomy, with findings at that time of severe endometriosis. She denies a known history of uterine fibroids. She denies vaginal bleeding, pelvic pressure, urinary frequency or urgency, constipation or narrow caliber stools.   Interval History:  she continues to be asymptomatic of this uterine mass and denies vaginal bleeding.   Current Meds:  Outpatient Encounter Prescriptions as of 02/27/2014  Medication Sig  . atorvastatin (LIPITOR) 10 MG tablet Take one-half tablet by mouth at bedtime for cholesterol  . Calcium Carbonate-Vit D-Min (CALCIUM 1200 PO) Take one tablet by mouth once daily  . doxylamine, Sleep, (UNISOM) 25 MG tablet Take 25 mg by mouth at bedtime as needed for sleep.  [name] latanoprost (XALATAN) 0.005 % ophthalmic solution Place 1 drop into both eyes at bedtime.  . Multiple Vitamins-Minerals (ICAPS) CAPS Take 2 each by mouth.  . Omega-3 Fatty Acids (FISH OIL) 1200 MG CAPS Take 1,200 mg by mouth daily.  . ondansetron (ZOFRAN) 4 MG tablet   . timolol (TIMOPTIC) 0.5 % ophthalmic solution Place 1 drop into both eyes 2 (two)  times daily.  . [DISCONTINUED] azithromycin (ZITHROMAX) 250 MG tablet   . [DISCONTINUED] brimonidine-timolol (COMBIGAN) 0.2-0.5 % ophthalmic solution   . [DISCONTINUED] gentamicin (GARAMYCIN) 0.3 % ophthalmic ointment   . [DISCONTINUED]  HYDROcodone-acetaminophen (NORCO/VICODIN) 5-325 MG per tablet   . [DISCONTINUED] ketorolac (ACULAR) 0.5 % ophthalmic solution Place 1 drop into the right eye 3 (three) times daily.  . [DISCONTINUED] Loteprednol-Tobramycin 0.5-0.3 % SUSP   . [DISCONTINUED] meclizine (ANTIVERT) 12.5 MG tablet   . [DISCONTINUED] polyethylene glycol powder (GLYCOLAX/MIRALAX) powder   . [DISCONTINUED] Specialty Vitamins Products (ICAPS LUTEIN-ZEAXANTHIN PO) Take 6 mg by mouth daily.    Allergy:  Allergies  Allergen Reactions  . Formaldehyde Rash  . Nickel Rash    Social Hx:   History   Social History  . Marital Status: Married    Spouse Name: N/A    Number of Children: N/A  . Years of Education: N/A   Occupational History  . Not on file.   Social History Main Topics  . Smoking status: Former Smoker    Quit date: 08/01/1988  . Smokeless tobacco: Never Used  . Alcohol Use: No  . Drug Use: No  . Sexual Activity:    Other Topics Concern  . Not on file   Social History Narrative  . No narrative on file    Past Surgical Hx:  Past Surgical History  Procedure Laterality Date  . Tonsillectomy      1945   . Ovarian tumor       benign tumor removed   . Appendectomy    . Total knee arthroplasty Right 09/10/2012    Procedure: RIGHT TOTAL KNEE ARTHROPLASTY;  Surgeon: Gearlean Alf, MD;  Location: [facility];  Service: Orthopedics;  Laterality: Right;    Past Medical Hx:  Past Medical History  Diagnosis Date  . Arthritis   . Glaucoma   . Macular degeneration   . Osteoarthrosis, unspecified whether generalized or localized, unspecified site   . Pain in limb   . Herpes zoster with unspecified complication   . Allergic rhinitis due to pollen   . Anemia, unspecified   . Other and unspecified hyperlipidemia   . Other malaise and fatigue     Past Gynecological History:   G0, severe endometriosis, low transverse incision for right ovarian cystectomy. No LMP recorded. Patient is  postmenopausal.  Family Hx: No family history on file. No family history for breast/ovarian/colon cancer.  Review of Systems:  Constitutional  Feels well,    ENT Normal appearing ears and nares bilaterally Skin/Breast  No rash, sores, jaundice, itching, dryness Cardiovascular  No chest pain, shortness of breath, or edema  Pulmonary  No cough or wheeze.  Gastro Intestinal  No nausea, vomitting, or diarrhoea. No bright red blood per rectum, no abdominal pain, change in bowel movement, or constipation.  Genito Urinary  No frequency, urgency, dysuria, see HPI Musculo Skeletal  No myalgia, arthralgia, joint swelling. + right hip pain  Neurologic  No weakness, numbness, change in gait,  Psychology  No depression, anxiety, insomnia.   Vitals:  Blood pressure 141/81, pulse 73, temperature 98.6 F (37 C), temperature source Oral, resp. rate 18, height 5' 3.5" (1.613 m), weight 142 lb 4.8 oz (64.547 kg).  Physical Exam: WD in NAD Neck  Supple NROM, without any enlargements.  Lymph Node Survey No cervical supraclavicular or inguinal adenopathy Cardiovascular  Pulse normal rate, regularity and rhythm. S1 and S2 normal.  Lungs  Clear to auscultation bilateraly, without wheezes/crackles/rhonchi. Good air  movement.  Skin  No rash/lesions/breakdown  Psychiatry  Alert and oriented to person, place, and time  Abdomen  Normoactive bowel sounds, abdomen soft, non-tender and mildly overweight without evidence of hernia. No palpable masses. Back No CVA tenderness Genito Urinary  Vulva/vagina: Normal external female genitalia.  No lesions. No discharge or bleeding.  Bladder/urethra:  No lesions or masses, well supported bladder  Vagina: normal, atrophic  Cervix: Normal appearing, no lesions.  Uterus: 10 week size. Mobile, no parametrial involvement or nodularity.  Adnexa: no palpable masses. Rectal  Good tone, no masses no cul de sac nodularity.  Extremities  No bilateral cyanosis,  clubbing or edema.  Donaciano Eva, MD  02/27/2014, 5:12 PM

## 2014-03-03 ENCOUNTER — Ambulatory Visit: Payer: Medicare Other | Admitting: Gynecologic Oncology

## 2014-03-05 ENCOUNTER — Other Ambulatory Visit: Payer: Self-pay | Admitting: Internal Medicine

## 2014-03-05 DIAGNOSIS — Z23 Encounter for immunization: Secondary | ICD-10-CM

## 2014-03-05 MED ORDER — TETANUS-DIPHTH-ACELL PERTUSSIS 5-2.5-18.5 LF-MCG/0.5 IM SUSP: 0.5000 mL | Freq: Once | INTRAMUSCULAR | Status: DC

## 2014-03-19 ENCOUNTER — Encounter (HOSPITAL_COMMUNITY): Payer: Self-pay | Admitting: Pharmacy Technician

## 2014-03-19 NOTE — Patient Instructions (Addendum)
Meredith Moore  03/19/2014                           YOUR PROCEDURE IS SCHEDULED ON:  03/25/14               ENTER THRU [facility] ENTRANCE AND                            FOLLOW  SIGNS TO SHORT STAY CENTER                 ARRIVE AT SHORT STAY AT: 5:30 AM               CALL THIS NUMBER IF ANY PROBLEMS THE DAY OF SURGERY :               [phone]                                REMEMBER:   Do not eat food or drink liquids AFTER MIDNIGHT                  Take these medicines the morning of surgery with               A SIPS OF WATER :  MAY USE HYDROCODONE IF NEEDED / USE EYE DROPS             CLEAR LIQUIDS ONLY FOR 24 HRS PRE OP     CLEAR LIQUID DIET   Foods Allowed                                                                     Foods Excluded  Coffee and tea, regular and decaf                             liquids that you cannot  Plain Jell-O in any flavor                                             see through such as: Fruit ices (not with fruit pulp)                                     milk, soups, orange juice  Iced Popsicles                                    All solid food Carbonated beverages, regular and diet                                    Cranberry, grape and apple juices Sports drinks like Gatorade Lightly seasoned clear broth or consume(fat free) Sugar, honey syrup  _____________________________________________________________________  Do not wear jewelry, make-up   Do not wear lotions, powders, or perfumes.   Do not shave legs or underarms 12 hrs. before surgery (men may shave face)  Do not bring valuables to the hospital.  Contacts, dentures or bridgework may not be worn into surgery.  Leave suitcase in the car. After surgery it may be brought to your room.  For patients admitted to the hospital more than one night, checkout time is            11:00 AM                                                       The  day of discharge.   Patients discharged the day of surgery will not be allowed to drive home.            If going home same day of surgery, must have someone stay with you              FIRST 24 hrs at home and arrange for some one to drive you              home from hospital.   ________________________________________________________________________                                                                        Meredith Moore  Before surgery, you can play an important role.  Because skin is not sterile, your skin needs to be as free of germs as possible.  You can reduce the number of germs on your skin by washing with CHG (chlorahexidine gluconate) soap before surgery.  CHG is an antiseptic cleaner which kills germs and bonds with the skin to continue killing germs even after washing. Please DO NOT use if you have an allergy to CHG or antibacterial soaps.  If your skin becomes reddened/irritated stop using the CHG and inform your nurse when you arrive at Short Stay. Do not shave (including legs and underarms) for at least 48 hours prior to the first CHG shower.  You may shave your face. Please follow these instructions carefully:   1.  Shower with CHG Soap the night before surgery and the  morning of Surgery.   2.  If you choose to wash your hair, wash your hair first as usual with your  normal  Shampoo.   3.  After you shampoo, rinse your hair and body thoroughly to remove the  shampoo.                                         4.  Use CHG as you would any other liquid soap.  You can apply chg directly  to the skin and wash . Gently wash with scrungie or clean wascloth    5.  Apply the CHG Soap to your body ONLY FROM THE NECK DOWN.   Do not use on open  Wound or open sores. Avoid contact with eyes, ears mouth and genitals (private parts).                        Genitals (private parts) with your normal soap.              6.  Wash  thoroughly, paying special attention to the area where your surgery  will be performed.   7.  Thoroughly rinse your body with warm water from the neck down.   8.  DO NOT shower/wash with your normal soap after using and rinsing off  the CHG Soap .                9.  Pat yourself dry with a clean towel.             10.  Wear clean pajamas.             11.  Place clean sheets on your bed the night of your first shower and do not  sleep with pets.  Day of Surgery : Do not apply any lotions/deodorants the morning of surgery.  Please wear clean clothes to the hospital/surgery center.  FAILURE TO FOLLOW THESE INSTRUCTIONS MAY RESULT IN THE CANCELLATION OF YOUR SURGERY    PATIENT SIGNATURE_________________________________  ______________________________________________________________________     Meredith Moore  An incentive spirometer is a tool that can help keep your lungs clear and active. This tool measures how well you are filling your lungs with each breath. Taking long deep breaths may help reverse or decrease the chance of developing breathing (pulmonary) problems (especially infection) following:  A long period of time when you are unable to move or be active. BEFORE THE PROCEDURE   If the spirometer includes an indicator to show your best effort, your nurse or respiratory therapist will set it to a desired goal.  If possible, sit up straight or lean slightly forward. Try not to slouch.  Hold the incentive spirometer in an upright position. INSTRUCTIONS FOR USE  1. Sit on the edge of your bed if possible, or sit up as far as you can in bed or on a chair. 2. Hold the incentive spirometer in an upright position. 3. Breathe out normally. 4. Place the mouthpiece in your mouth and seal your lips tightly around it. 5. Breathe in slowly and as deeply as possible, raising the piston or the ball toward the top of the column. 6. Hold your breath for 3-5 seconds or for as  long as possible. Allow the piston or ball to fall to the bottom of the column. 7. Remove the mouthpiece from your mouth and breathe out normally. 8. Rest for a few seconds and repeat Steps 1 through 7 at least 10 times every 1-2 hours when you are awake. Take your time and take a few normal breaths between deep breaths. 9. The spirometer may include an indicator to show your best effort. Use the indicator as a goal to work toward during each repetition. 10. After each set of 10 deep breaths, practice coughing to be sure your lungs are clear. If you have an incision (the cut made at the time of surgery), support your incision when coughing by placing a pillow or rolled up towels firmly against it. Once you are able to get out of bed, walk around indoors and cough well. You may stop using the incentive spirometer when instructed by your caregiver.  RISKS AND COMPLICATIONS  Take your time so you do not get dizzy or light-headed.  If you are in pain, you may need to take or ask for pain medication before doing incentive spirometry. It is harder to take a deep breath if you are having pain. AFTER USE  Rest and breathe slowly and easily.  It can be helpful to keep track of a log of your progress. Your caregiver can provide you with a simple table to help with this. If you are using the spirometer at home, follow these instructions: [place] IF:   You are having difficultly using the spirometer.  You have trouble using the spirometer as often as instructed.  Your pain medication is not giving enough relief while using the spirometer.  You develop fever of 100.5 F (38.1 C) or higher. SEEK IMMEDIATE MEDICAL CARE IF:   You cough up bloody sputum that had not been present before.  You develop fever of 102 F (38.9 C) or greater.  You develop worsening pain at or near the incision site. MAKE SURE YOU:   Understand these instructions.  Will watch your condition.  Will get help  right away if you are not doing well or get worse. Document Released: 11/28/2006 Document Revised: 10/10/2011 Document Reviewed: 01/29/2007 Exit[employer] Patient Information 2014 [employer], [place].   ________________________________________________________________________  WHAT IS A BLOOD TRANSFUSION? Blood Transfusion Information  A transfusion is the replacement of blood or some of its parts. Blood is made up of multiple cells which provide different functions.  Red blood cells carry oxygen and are used for blood loss replacement.  White blood cells fight against infection.  Platelets control bleeding.  Plasma helps clot blood.  Other blood products are available for specialized needs, such as hemophilia or other clotting disorders. BEFORE THE TRANSFUSION  Who gives blood for transfusions?   Healthy volunteers who are fully evaluated to make sure their blood is safe. This is blood bank blood. Transfusion therapy is the safest it has ever been in the practice of medicine. Before blood is taken from a donor, a complete history is taken to make sure that person has no history of diseases nor engages in risky social behavior (examples are intravenous drug use or sexual activity with multiple partners). The donor's travel history is screened to minimize risk of transmitting infections, such as malaria. The donated blood is tested for signs of infectious diseases, such as HIV and hepatitis. The blood is then tested to be sure it is compatible with you in order to minimize the chance of a transfusion reaction. If you or a relative donates blood, this is often done in anticipation of surgery and is not appropriate for emergency situations. It takes many days to process the donated blood. RISKS AND COMPLICATIONS Although transfusion therapy is very safe and saves many lives, the main dangers of transfusion include:   Getting an infectious disease.  Developing a transfusion reaction. This is an  allergic reaction to something in the blood you were given. Every precaution is taken to prevent this. The decision to have a blood transfusion has been considered carefully by your caregiver before blood is given. Blood is not given unless the benefits outweigh the risks. AFTER THE TRANSFUSION  Right after receiving a blood transfusion, you will usually feel much better and more energetic. This is especially true if your red blood cells have gotten low (anemic). The transfusion raises the level of the red blood cells which carry oxygen, and this usually causes an energy  increase.  The nurse administering the transfusion will monitor you carefully for complications. HOME CARE INSTRUCTIONS  No special instructions are needed after a transfusion. You may find your energy is better. Speak with your caregiver about any limitations on activity for underlying diseases you may have. SEEK MEDICAL CARE IF:   Your condition is not improving after your transfusion.  You develop redness or irritation at the intravenous (IV) site. SEEK IMMEDIATE MEDICAL CARE IF:  Any of the following symptoms occur over the next 12 hours:  Shaking chills.  You have a temperature by mouth above 102 F (38.9 C), not controlled by medicine.  Chest, back, or muscle pain.  People around you feel you are not acting correctly or are confused.  Shortness of breath or difficulty breathing.  Dizziness and fainting.  You get a rash or develop hives.  You have a decrease in urine output.  Your urine turns a dark color or changes to pink, red, or brown. Any of the following symptoms occur over the next 10 days:  You have a temperature by mouth above 102 F (38.9 C), not controlled by medicine.  Shortness of breath.  Weakness after normal activity.  The white part of the eye turns yellow (jaundice).  You have a decrease in the amount of urine or are urinating less often.  Your urine turns a dark color or changes  to pink, red, or brown. Document Released: 07/15/2000 Document Revised: 10/10/2011 Document Reviewed: 03/03/2008 [facility] Patient Information 2014 [place], [place].  _______________________________________________________________________

## 2014-03-20 ENCOUNTER — Encounter (HOSPITAL_COMMUNITY): Payer: Self-pay

## 2014-03-20 ENCOUNTER — Encounter (HOSPITAL_COMMUNITY)
Admission: RE | Admit: 2014-03-20 | Discharge: 2014-03-20 | Disposition: A | Discharge status: Home / Self Care | Payer: Medicare Other | Source: Ambulatory Visit | Attending: Gynecologic Oncology | Admitting: Gynecologic Oncology

## 2014-03-20 DIAGNOSIS — Z01818 Encounter for other preprocedural examination: Secondary | ICD-10-CM | POA: Insufficient documentation

## 2014-03-20 DIAGNOSIS — N9489 Other specified conditions associated with female genital organs and menstrual cycle: Secondary | ICD-10-CM | POA: Insufficient documentation

## 2014-03-20 DIAGNOSIS — N852 Hypertrophy of uterus: Secondary | ICD-10-CM | POA: Diagnosis not present

## 2014-03-20 HISTORY — DX: Personal history of other medical treatment: Z92.89

## 2014-03-20 HISTORY — DX: Hyperlipidemia, unspecified: E78.5

## 2014-03-20 LAB — COMPREHENSIVE METABOLIC PANEL
ALBUMIN: 3.8 g/dL (ref 3.5–5.2)
ALK PHOS: 64 U/L (ref 39–117)
ALT: 16 U/L (ref 0–35)
ANION GAP: 11 (ref 5–15)
AST: 20 U/L (ref 0–37)
BILIRUBIN TOTAL: 0.3 mg/dL (ref 0.3–1.2)
BUN: 16 mg/dL (ref 6–23)
CHLORIDE: 101 meq/L (ref 96–112)
CO2: 25 mEq/L (ref 19–32)
CREATININE: 0.85 mg/dL (ref 0.50–1.10)
Calcium: 9.7 mg/dL (ref 8.4–10.5)
GFR calc Af Amer: 76 mL/min — ABNORMAL LOW (ref >90)
GFR calc non Af Amer: 65 mL/min — ABNORMAL LOW (ref >90)
Glucose, Bld: 101 mg/dL — ABNORMAL HIGH (ref 70–99)
POTASSIUM: 4.5 meq/L (ref 3.7–5.3)
Sodium: 137 mEq/L (ref 137–147)
TOTAL PROTEIN: 7.2 g/dL (ref 6.0–8.3)

## 2014-03-20 LAB — URINALYSIS, ROUTINE W REFLEX MICROSCOPIC
Bilirubin Urine: NEGATIVE
Glucose, UA: NEGATIVE mg/dL
Hgb urine dipstick: NEGATIVE
Ketones, ur: NEGATIVE mg/dL
LEUKOCYTES UA: NEGATIVE
NITRITE: NEGATIVE
Protein, ur: NEGATIVE mg/dL
SPECIFIC GRAVITY, URINE: 1.014 (ref 1.005–1.030)
Urobilinogen, UA: 0.2 mg/dL (ref 0.0–1.0)
pH: 5.5 (ref 5.0–8.0)

## 2014-03-20 LAB — CBC WITH DIFFERENTIAL/PLATELET
BASOS ABS: 0 10*3/uL (ref 0.0–0.1)
Basophils Relative: 0 % (ref 0–1)
Eosinophils Absolute: 0.1 10*3/uL (ref 0.0–0.7)
Eosinophils Relative: 1 % (ref 0–5)
HCT: 36.3 % (ref 36.0–46.0)
Hemoglobin: 11.8 g/dL — ABNORMAL LOW (ref 12.0–15.0)
Lymphocytes Relative: 27 % (ref 12–46)
Lymphs Abs: 1.9 10*3/uL (ref 0.7–4.0)
MCH: 29.7 pg (ref 26.0–34.0)
MCHC: 32.5 g/dL (ref 30.0–36.0)
MCV: 91.4 fL (ref 78.0–100.0)
Monocytes Absolute: 0.6 10*3/uL (ref 0.1–1.0)
Monocytes Relative: 8 % (ref 3–12)
NEUTROS ABS: 4.5 10*3/uL (ref 1.7–7.7)
NEUTROS PCT: 64 % (ref 43–77)
PLATELETS: 297 10*3/uL (ref 150–400)
RBC: 3.97 MIL/uL (ref 3.87–5.11)
RDW: 13 % (ref 11.5–15.5)
WBC: 7 10*3/uL (ref 4.0–10.5)

## 2014-03-25 ENCOUNTER — Encounter (HOSPITAL_COMMUNITY): Payer: Self-pay | Admitting: *Deleted

## 2014-03-25 ENCOUNTER — Ambulatory Visit (HOSPITAL_COMMUNITY)
Admission: RE | Admit: 2014-03-25 | Discharge: 2014-03-26 | Disposition: A | Discharge status: Home / Self Care | Payer: Medicare Other | Source: Ambulatory Visit | Attending: Gynecologic Oncology | Admitting: Gynecologic Oncology

## 2014-03-25 ENCOUNTER — Encounter (HOSPITAL_COMMUNITY)
Admission: RE | Disposition: A | Discharge status: Home / Self Care | Payer: Self-pay | Source: Ambulatory Visit | Attending: Gynecologic Oncology

## 2014-03-25 ENCOUNTER — Encounter (HOSPITAL_COMMUNITY): Payer: Medicare Other | Admitting: Anesthesiology

## 2014-03-25 ENCOUNTER — Ambulatory Visit (HOSPITAL_COMMUNITY): Payer: Medicare Other | Admitting: Anesthesiology

## 2014-03-25 DIAGNOSIS — H548 Legal blindness, as defined in USA: Secondary | ICD-10-CM | POA: Diagnosis not present

## 2014-03-25 DIAGNOSIS — E785 Hyperlipidemia, unspecified: Secondary | ICD-10-CM | POA: Diagnosis not present

## 2014-03-25 DIAGNOSIS — H3530 Unspecified macular degeneration: Secondary | ICD-10-CM | POA: Insufficient documentation

## 2014-03-25 DIAGNOSIS — Z79899 Other long term (current) drug therapy: Secondary | ICD-10-CM | POA: Diagnosis not present

## 2014-03-25 DIAGNOSIS — Z888 Allergy status to other drugs, medicaments and biological substances status: Secondary | ICD-10-CM | POA: Insufficient documentation

## 2014-03-25 DIAGNOSIS — Z87891 Personal history of nicotine dependence: Secondary | ICD-10-CM | POA: Insufficient documentation

## 2014-03-25 DIAGNOSIS — D259 Leiomyoma of uterus, unspecified: Secondary | ICD-10-CM

## 2014-03-25 DIAGNOSIS — N858 Other specified noninflammatory disorders of uterus: Secondary | ICD-10-CM | POA: Diagnosis present

## 2014-03-25 DIAGNOSIS — D649 Anemia, unspecified: Secondary | ICD-10-CM | POA: Insufficient documentation

## 2014-03-25 HISTORY — PX: ROBOTIC ASSISTED TOTAL HYSTERECTOMY WITH BILATERAL SALPINGO OOPHERECTOMY: SHX6086

## 2014-03-25 LAB — TYPE AND SCREEN
ABO/RH(D): A POS
ANTIBODY SCREEN: NEGATIVE

## 2014-03-25 SURGERY — ROBOTIC ASSISTED TOTAL HYSTERECTOMY WITH BILATERAL SALPINGO OOPHORECTOMY
Anesthesia: General | Laterality: Bilateral

## 2014-03-25 MED ORDER — FENTANYL CITRATE 0.05 MG/ML IJ SOLN
INTRAMUSCULAR | Status: DC | PRN
  Administered 2014-03-25 (×4): 50 ug via INTRAVENOUS

## 2014-03-25 MED ORDER — GLYCOPYRROLATE 0.2 MG/ML IJ SOLN
INTRAMUSCULAR | Status: AC
  Filled 2014-03-25: qty 3

## 2014-03-25 MED ORDER — HYDROMORPHONE HCL PF 1 MG/ML IJ SOLN
INTRAMUSCULAR | Status: AC
  Filled 2014-03-25: qty 1

## 2014-03-25 MED ORDER — LUTEIN 6 MG PO CAPS: 6.0000 mg | ORAL_CAPSULE | Freq: Two times a day (BID) | ORAL | Status: DC

## 2014-03-25 MED ORDER — LACTATED RINGERS IV SOLN: INTRAVENOUS | Status: DC

## 2014-03-25 MED ORDER — STERILE WATER FOR IRRIGATION IR SOLN
Status: DC | PRN
  Administered 2014-03-25: 3000 mL

## 2014-03-25 MED ORDER — MEPERIDINE HCL 50 MG/ML IJ SOLN: 6.2500 mg | INTRAMUSCULAR | Status: DC | PRN

## 2014-03-25 MED ORDER — PROPOFOL 10 MG/ML IV BOLUS
INTRAVENOUS | Status: AC
  Filled 2014-03-25: qty 20

## 2014-03-25 MED ORDER — CEFAZOLIN SODIUM-DEXTROSE 2-3 GM-% IV SOLR
2.0000 g | INTRAVENOUS | Status: AC
  Administered 2014-03-25: 2 g via INTRAVENOUS

## 2014-03-25 MED ORDER — PROMETHAZINE HCL 25 MG/ML IJ SOLN: 6.2500 mg | INTRAMUSCULAR | Status: DC | PRN

## 2014-03-25 MED ORDER — OXYCODONE-ACETAMINOPHEN 5-325 MG PO TABS: 1.0000 | ORAL_TABLET | ORAL | Status: DC | PRN

## 2014-03-25 MED ORDER — LACTATED RINGERS IV SOLN
INTRAVENOUS | Status: DC | PRN
  Administered 2014-03-25 (×3): via INTRAVENOUS

## 2014-03-25 MED ORDER — HYDROMORPHONE HCL PF 1 MG/ML IJ SOLN
0.2500 mg | INTRAMUSCULAR | Status: DC | PRN
  Administered 2014-03-25 (×4): 0.5 mg via INTRAVENOUS

## 2014-03-25 MED ORDER — PROPOFOL 10 MG/ML IV BOLUS
INTRAVENOUS | Status: DC | PRN
  Administered 2014-03-25: 120 mg via INTRAVENOUS

## 2014-03-25 MED ORDER — DEXAMETHASONE SODIUM PHOSPHATE 10 MG/ML IJ SOLN
INTRAMUSCULAR | Status: DC | PRN
  Administered 2014-03-25: 10 mg via INTRAVENOUS

## 2014-03-25 MED ORDER — ONDANSETRON HCL 4 MG PO TABS: 4.0000 mg | ORAL_TABLET | Freq: Four times a day (QID) | ORAL | Status: DC | PRN

## 2014-03-25 MED ORDER — GLYCOPYRROLATE 0.2 MG/ML IJ SOLN
INTRAMUSCULAR | Status: DC | PRN
  Administered 2014-03-25: 0.6 mg via INTRAVENOUS

## 2014-03-25 MED ORDER — PHENYLEPHRINE HCL 10 MG/ML IJ SOLN
INTRAMUSCULAR | Status: AC
  Filled 2014-03-25: qty 1

## 2014-03-25 MED ORDER — NEOSTIGMINE METHYLSULFATE 10 MG/10ML IV SOLN
INTRAVENOUS | Status: AC
  Filled 2014-03-25: qty 1

## 2014-03-25 MED ORDER — LACTATED RINGERS IR SOLN
Status: DC | PRN
  Administered 2014-03-25: 1000 mL

## 2014-03-25 MED ORDER — CEFAZOLIN SODIUM-DEXTROSE 2-3 GM-% IV SOLR
INTRAVENOUS | Status: AC
  Filled 2014-03-25: qty 50

## 2014-03-25 MED ORDER — KETOROLAC TROMETHAMINE 15 MG/ML IJ SOLN
15.0000 mg | Freq: Four times a day (QID) | INTRAMUSCULAR | Status: AC
Start: 2014-03-25 — End: 2014-03-26
  Administered 2014-03-25 – 2014-03-26 (×4): 15 mg via INTRAVENOUS
  Filled 2014-03-25 (×5): qty 1

## 2014-03-25 MED ORDER — CISATRACURIUM BESYLATE 20 MG/10ML IV SOLN
INTRAVENOUS | Status: AC
  Filled 2014-03-25: qty 10

## 2014-03-25 MED ORDER — LATANOPROST 0.005 % OP SOLN
1.0000 [drp] | Freq: Every day | OPHTHALMIC | Status: DC
  Administered 2014-03-25: 1 [drp] via OPHTHALMIC
  Filled 2014-03-25: qty 2.5

## 2014-03-25 MED ORDER — FENTANYL CITRATE 0.05 MG/ML IJ SOLN
INTRAMUSCULAR | Status: AC
  Filled 2014-03-25: qty 5

## 2014-03-25 MED ORDER — ONDANSETRON HCL 4 MG/2ML IJ SOLN: 4.0000 mg | Freq: Four times a day (QID) | INTRAMUSCULAR | Status: DC | PRN

## 2014-03-25 MED ORDER — FENTANYL CITRATE 0.05 MG/ML IJ SOLN: 25.0000 ug | INTRAMUSCULAR | Status: DC | PRN

## 2014-03-25 MED ORDER — CISATRACURIUM BESYLATE (PF) 10 MG/5ML IV SOLN
INTRAVENOUS | Status: DC | PRN
  Administered 2014-03-25: 2 mg via INTRAVENOUS
  Administered 2014-03-25: 1 mg via INTRAVENOUS
  Administered 2014-03-25: 7 mg via INTRAVENOUS

## 2014-03-25 MED ORDER — METOCLOPRAMIDE HCL 5 MG/ML IJ SOLN
INTRAMUSCULAR | Status: AC
  Filled 2014-03-25: qty 2

## 2014-03-25 MED ORDER — HYDROMORPHONE HCL PF 1 MG/ML IJ SOLN: 0.5000 mg | INTRAMUSCULAR | Status: DC | PRN

## 2014-03-25 MED ORDER — PHENYLEPHRINE HCL 10 MG/ML IJ SOLN
10.0000 mg | INTRAVENOUS | Status: DC | PRN
  Administered 2014-03-25: 25 ug/min via INTRAVENOUS

## 2014-03-25 MED ORDER — DEXAMETHASONE SODIUM PHOSPHATE 10 MG/ML IJ SOLN
INTRAMUSCULAR | Status: AC
  Filled 2014-03-25: qty 1

## 2014-03-25 MED ORDER — ONDANSETRON HCL 4 MG/2ML IJ SOLN
INTRAMUSCULAR | Status: AC
  Filled 2014-03-25: qty 2

## 2014-03-25 MED ORDER — TIMOLOL MALEATE 0.5 % OP SOLN
1.0000 [drp] | Freq: Two times a day (BID) | OPHTHALMIC | Status: DC
  Administered 2014-03-25 – 2014-03-26 (×2): 1 [drp] via OPHTHALMIC
  Filled 2014-03-25: qty 5

## 2014-03-25 MED ORDER — ONDANSETRON HCL 4 MG/2ML IJ SOLN
INTRAMUSCULAR | Status: DC | PRN
  Administered 2014-03-25: 4 mg via INTRAVENOUS

## 2014-03-25 MED ORDER — NEOSTIGMINE METHYLSULFATE 10 MG/10ML IV SOLN
INTRAVENOUS | Status: DC | PRN
  Administered 2014-03-25: 4 mg via INTRAVENOUS

## 2014-03-25 MED ORDER — KETOROLAC TROMETHAMINE 15 MG/ML IJ SOLN
15.0000 mg | Freq: Four times a day (QID) | INTRAMUSCULAR | Status: AC
  Filled 2014-03-25 (×4): qty 1

## 2014-03-25 MED ORDER — HEPARIN SODIUM (PORCINE) 1000 UNIT/ML IJ SOLN
INTRAMUSCULAR | Status: AC
  Filled 2014-03-25: qty 1

## 2014-03-25 MED ORDER — METOCLOPRAMIDE HCL 5 MG/ML IJ SOLN
INTRAMUSCULAR | Status: DC | PRN
  Administered 2014-03-25: 10 mg via INTRAVENOUS

## 2014-03-25 MED ORDER — KCL IN DEXTROSE-NACL 20-5-0.45 MEQ/L-%-% IV SOLN
INTRAVENOUS | Status: DC
  Administered 2014-03-25 – 2014-03-26 (×2): via INTRAVENOUS
  Filled 2014-03-25 (×3): qty 1000

## 2014-03-25 SURGICAL SUPPLY — 52 items
ADH SKN CLS APL DERMABOND .7 (GAUZE/BANDAGES/DRESSINGS) ×1
CABLE HIGH FREQUENCY MONO STRZ (ELECTRODE) ×2 IMPLANT
CHLORAPREP W/TINT 26ML (MISCELLANEOUS) ×2 IMPLANT
CORDS BIPOLAR (ELECTRODE) ×2 IMPLANT
COVER SURGICAL LIGHT HANDLE (MISCELLANEOUS) ×2 IMPLANT
COVER TIP SHEARS 8 DVNC (MISCELLANEOUS) ×1 IMPLANT
COVER TIP SHEARS 8MM DA VINCI (MISCELLANEOUS) ×1
DERMABOND ADVANCED (GAUZE/BANDAGES/DRESSINGS) ×1
DERMABOND ADVANCED .7 DNX12 (GAUZE/BANDAGES/DRESSINGS) ×1 IMPLANT
DRAPE LG THREE QUARTER DISP (DRAPES) ×4 IMPLANT
DRAPE SURG IRRIG POUCH 19X23 (DRAPES) ×2 IMPLANT
DRAPE TABLE BACK 44X90 PK DISP (DRAPES) ×4 IMPLANT
DRAPE WARM FLUID 44X44 (DRAPE) ×2 IMPLANT
DRSG TEGADERM 4X4.75 (GAUZE/BANDAGES/DRESSINGS) ×2 IMPLANT
DRSG TEGADERM 6X8 (GAUZE/BANDAGES/DRESSINGS) ×4 IMPLANT
ELECT REM PT RETURN 9FT ADLT (ELECTROSURGICAL) ×2
ELECTRODE REM PT RTRN 9FT ADLT (ELECTROSURGICAL) ×1 IMPLANT
GAUZE SPONGE 4X4 16PLY XRAY LF (GAUZE/BANDAGES/DRESSINGS) ×2 IMPLANT
GLOVE BIO SURGEON STRL SZ 6 (GLOVE) ×6 IMPLANT
GLOVE BIO SURGEON STRL SZ 6.5 (GLOVE) ×4 IMPLANT
GOWN STRL REUS W/ TWL LRG LVL3 (GOWN DISPOSABLE) ×3 IMPLANT
GOWN STRL REUS W/ TWL LRG LVL4 (GOWN DISPOSABLE) IMPLANT
GOWN STRL REUS W/TWL LRG LVL3 (GOWN DISPOSABLE) ×10 IMPLANT
GOWN STRL REUS W/TWL LRG LVL4 (GOWN DISPOSABLE)
HOLDER FOLEY CATH W/STRAP (MISCELLANEOUS) IMPLANT
KIT ACCESSORY DA VINCI DISP (KITS) ×1
KIT ACCESSORY DVNC DISP (KITS) ×1 IMPLANT
KIT BASIN OR (CUSTOM PROCEDURE TRAY) ×2 IMPLANT
MANIPULATOR UTERINE 4.5 ZUMI (MISCELLANEOUS) ×2 IMPLANT
OCCLUDER COLPOPNEUMO (BALLOONS) ×2 IMPLANT
PEN SKIN MARKING BROAD (MISCELLANEOUS) ×2 IMPLANT
POUCH ENDO CATCH II 15MM (MISCELLANEOUS) ×2 IMPLANT
POUCH SPECIMEN RETRIEVAL 10MM (ENDOMECHANICALS) IMPLANT
SET TUBE IRRIG SUCTION NO TIP (IRRIGATION / IRRIGATOR) ×2 IMPLANT
SHEET LAVH (DRAPES) ×2 IMPLANT
SOLUTION ANTI FOG 6CC (MISCELLANEOUS) ×2 IMPLANT
SOLUTION ELECTROLUBE (MISCELLANEOUS) ×2 IMPLANT
SUT VIC AB 0 CT1 27 (SUTURE) ×1
SUT VIC AB 0 CT1 27XBRD ANTBC (SUTURE) ×1 IMPLANT
SUT VIC AB 4-0 PS2 27 (SUTURE) ×4 IMPLANT
SUT VICRYL 0 UR6 27IN ABS (SUTURE) IMPLANT
SYRINGE 60CC LL (MISCELLANEOUS) ×2 IMPLANT
TOWEL OR 17X26 10 PK STRL BLUE (TOWEL DISPOSABLE) ×4 IMPLANT
TOWEL OR NON WOVEN STRL DISP B (DISPOSABLE) ×2 IMPLANT
TRAP SPECIMEN MUCOUS 40CC (MISCELLANEOUS) ×2 IMPLANT
TRAY FOLEY CATH 14FRSI W/METER (CATHETERS) ×2 IMPLANT
TRAY LAP CHOLE (CUSTOM PROCEDURE TRAY) ×2 IMPLANT
TROCAR 12M 150ML BLUNT (TROCAR) ×2 IMPLANT
TROCAR BLADELESS OPT 5 100 (ENDOMECHANICALS) ×2 IMPLANT
TROCAR XCEL 12X100 BLDLESS (ENDOMECHANICALS) ×2 IMPLANT
TUBING INSUFFLATION 10FT LAP (TUBING) ×2 IMPLANT
WATER STERILE IRR 1500ML POUR (IV SOLUTION) IMPLANT

## 2014-03-25 NOTE — H&P (View-Only) (Signed)
Consult Note: Gyn-Onc  Consult was requested by Dr. Benjie Karvonen for the evaluation of Meredith Moore 78 y.o. female with a complex uterine mass  CC:  Chief Complaint  Patient presents with  . Enlarged Uterus    Assessment/Plan:  Ms. Meredith Moore  is a 78 y.o.  year old legally blind woman with a 6.3 x 4.7 x 7.0 cm complex cystic and solid well-circumscribed mass within the anterior wall of the uterus. This mass is asymptomatic and was detected incidentally on an MRI performed to evaluate her right hip prior to hip replacement (which is scheduled for November 2015). I personally reviewed the images and it appears well-circumscribed on MRI, without evidence for her infiltration into the surrounding myometrium or pelvic structures.  Overall have a relatively low suspicion of sarcoma given her symptom profile (or lack thereof), and MRI appearance. However I discussed with the patient that the only definitive way to rule out sarcoma is with hysterectomy and pathology evaluation of the specimen. Given that she is a thin healthy woman in a good surgical candidate I believe this is a reasonable approach.  Again she would be a good candidate for a robotic-assisted total hysterectomy with bilateral salpingo-oophorectomy. We will send a specimen for frozen section at the time of surgery. I discussed surgical risk which includes:  bleeding, infection, damage to internal organs (such as bladder,ureters, bowels), blood clot, reoperation and rehospitalization. I discussed that conversion to a mini laparotomy may be necessary if her uterine specimen cannot be delivered intact vaginally (however on today's examination anticipate that this will be possible). We discussed the surgical route, anticipated hospital stay, and postoperative recovery. I believe that she'll be fully recovered from the planned hip replacement in November 2015.    HPI:  Meredith Moore is a 78 year old woman who is vision impaired (legally  blind) who has a history of severe lower stream he arthritis including a prior knee replacement. She was undergoing workup for severe right hip pain and a planned right hip arthroplasty, and as part of this evaluation she underwent an MRI of the right hip. This MRI revealed the incidental finding of a 7 cm anterior myometrial mass with complex echogenicity and cystic areas within it. This raised concern in a 78 year old woman for possible sarcoma. She has no known history of uterine fibroids.  Meredith Moore   has a history of primary infertility, and a minilaparotomy prior low transverse incision for right ovarian cystectomy, with findings at that time of severe endometriosis. She denies a known history of uterine fibroids. She denies vaginal bleeding, pelvic pressure, urinary frequency or urgency, constipation or narrow caliber stools.   Interval History:  she continues to be asymptomatic of this uterine mass and denies vaginal bleeding.   Current Meds:  Outpatient Encounter Prescriptions as of 02/27/2014  Medication Sig  . atorvastatin (LIPITOR) 10 MG tablet Take one-half tablet by mouth at bedtime for cholesterol  . Calcium Carbonate-Vit D-Min (CALCIUM 1200 PO) Take one tablet by mouth once daily  . doxylamine, Sleep, (UNISOM) 25 MG tablet Take 25 mg by mouth at bedtime as needed for sleep.  [name] latanoprost (XALATAN) 0.005 % ophthalmic solution Place 1 drop into both eyes at bedtime.  . Multiple Vitamins-Minerals (ICAPS) CAPS Take 2 each by mouth.  . Omega-3 Fatty Acids (FISH OIL) 1200 MG CAPS Take 1,200 mg by mouth daily.  . ondansetron (ZOFRAN) 4 MG tablet   . timolol (TIMOPTIC) 0.5 % ophthalmic solution Place 1 drop into both eyes 2 (two)  times daily.  . [DISCONTINUED] azithromycin (ZITHROMAX) 250 MG tablet   . [DISCONTINUED] brimonidine-timolol (COMBIGAN) 0.2-0.5 % ophthalmic solution   . [DISCONTINUED] gentamicin (GARAMYCIN) 0.3 % ophthalmic ointment   . [DISCONTINUED]  HYDROcodone-acetaminophen (NORCO/VICODIN) 5-325 MG per tablet   . [DISCONTINUED] ketorolac (ACULAR) 0.5 % ophthalmic solution Place 1 drop into the right eye 3 (three) times daily.  . [DISCONTINUED] Loteprednol-Tobramycin 0.5-0.3 % SUSP   . [DISCONTINUED] meclizine (ANTIVERT) 12.5 MG tablet   . [DISCONTINUED] polyethylene glycol powder (GLYCOLAX/MIRALAX) powder   . [DISCONTINUED] Specialty Vitamins Products (ICAPS LUTEIN-ZEAXANTHIN PO) Take 6 mg by mouth daily.    Allergy:  Allergies  Allergen Reactions  . Formaldehyde Rash  . Nickel Rash    Social Hx:   History   Social History  . Marital Status: Married    Spouse Name: N/A    Number of Children: N/A  . Years of Education: N/A   Occupational History  . Not on file.   Social History Main Topics  . Smoking status: Former Smoker    Quit date: 08/01/1988  . Smokeless tobacco: Never Used  . Alcohol Use: No  . Drug Use: No  . Sexual Activity:    Other Topics Concern  . Not on file   Social History Narrative  . No narrative on file    Past Surgical Hx:  Past Surgical History  Procedure Laterality Date  . Tonsillectomy      1945   . Ovarian tumor       benign tumor removed   . Appendectomy    . Total knee arthroplasty Right 09/10/2012    Procedure: RIGHT TOTAL KNEE ARTHROPLASTY;  Surgeon: Gearlean Alf, MD;  Location: [facility];  Service: Orthopedics;  Laterality: Right;    Past Medical Hx:  Past Medical History  Diagnosis Date  . Arthritis   . Glaucoma   . Macular degeneration   . Osteoarthrosis, unspecified whether generalized or localized, unspecified site   . Pain in limb   . Herpes zoster with unspecified complication   . Allergic rhinitis due to pollen   . Anemia, unspecified   . Other and unspecified hyperlipidemia   . Other malaise and fatigue     Past Gynecological History:   G0, severe endometriosis, low transverse incision for right ovarian cystectomy. No LMP recorded. Patient is  postmenopausal.  Family Hx: No family history on file. No family history for breast/ovarian/colon cancer.  Review of Systems:  Constitutional  Feels well,    ENT Normal appearing ears and nares bilaterally Skin/Breast  No rash, sores, jaundice, itching, dryness Cardiovascular  No chest pain, shortness of breath, or edema  Pulmonary  No cough or wheeze.  Gastro Intestinal  No nausea, vomitting, or diarrhoea. No bright red blood per rectum, no abdominal pain, change in bowel movement, or constipation.  Genito Urinary  No frequency, urgency, dysuria, see HPI Musculo Skeletal  No myalgia, arthralgia, joint swelling. + right hip pain  Neurologic  No weakness, numbness, change in gait,  Psychology  No depression, anxiety, insomnia.   Vitals:  Blood pressure 141/81, pulse 73, temperature 98.6 F (37 C), temperature source Oral, resp. rate 18, height 5' 3.5" (1.613 m), weight 142 lb 4.8 oz (64.547 kg).  Physical Exam: WD in NAD Neck  Supple NROM, without any enlargements.  Lymph Node Survey No cervical supraclavicular or inguinal adenopathy Cardiovascular  Pulse normal rate, regularity and rhythm. S1 and S2 normal.  Lungs  Clear to auscultation bilateraly, without wheezes/crackles/rhonchi. Good air  movement.  Skin  No rash/lesions/breakdown  Psychiatry  Alert and oriented to person, place, and time  Abdomen  Normoactive bowel sounds, abdomen soft, non-tender and mildly overweight without evidence of hernia. No palpable masses. Back No CVA tenderness Genito Urinary  Vulva/vagina: Normal external female genitalia.  No lesions. No discharge or bleeding.  Bladder/urethra:  No lesions or masses, well supported bladder  Vagina: normal, atrophic  Cervix: Normal appearing, no lesions.  Uterus: 10 week size. Mobile, no parametrial involvement or nodularity.  Adnexa: no palpable masses. Rectal  Good tone, no masses no cul de sac nodularity.  Extremities  No bilateral cyanosis,  clubbing or edema.  Donaciano Eva, MD  02/27/2014, 5:12 PM

## 2014-03-25 NOTE — Progress Notes (Signed)
PHARMACIST - PHYSICIAN ORDER COMMUNICATION  CONCERNING: P&T Medication Policy on Herbal Medications  DESCRIPTION:  This patient's order for:  Lutein  has been noted.  This product(s) is classified as an "herbal" or natural product. Due to a lack of definitive safety studies or FDA approval, nonstandard manufacturing practices, plus the potential risk of unknown drug-drug interactions while on inpatient medications, the Pharmacy and Therapeutics Committee does not permit the use of "herbal" or natural products of this type within [facility].   ACTION TAKEN: The pharmacy department is unable to verify this order at this time and your patient has been informed of this safety policy. Please reevaluate patient's clinical condition at discharge and address if the herbal or natural product(s) should be resumed at that time.  Doreene Eland, PharmD, BCPS.   Pager: [phone]  03/25/2014 11:35 AM

## 2014-03-25 NOTE — Anesthesia Preprocedure Evaluation (Addendum)
Anesthesia Evaluation  Patient identified by MRN, date of birth, ID band Patient awake    Reviewed: Allergy & Precautions, H&P , NPO status , Patient's Chart, lab work & pertinent test results  Airway Mallampati: II TM Distance: >3 FB Neck ROM: Full    Dental no notable dental hx.    Pulmonary neg pulmonary ROS, former smoker,  breath sounds clear to auscultation  Pulmonary exam normal       Cardiovascular negative cardio ROS  Rhythm:Regular Rate:Normal     Neuro/Psych negative neurological ROS  negative psych ROS   GI/Hepatic negative GI ROS, Neg liver ROS,   Endo/Other  negative endocrine ROS  Renal/GU negative Renal ROS  negative genitourinary   Musculoskeletal negative musculoskeletal ROS (+)   Abdominal   Peds negative pediatric ROS (+)  Hematology negative hematology ROS (+)   Anesthesia Other Findings   Reproductive/Obstetrics negative OB ROS                          Anesthesia Physical Anesthesia Plan  ASA: I  Anesthesia Plan: General   Post-op Pain Management:    Induction: Intravenous  Airway Management Planned: Oral ETT  Additional Equipment:   Intra-op Plan:   Post-operative Plan: Extubation in OR  Informed Consent: I have reviewed the patients History and Physical, chart, labs and discussed the procedure including the risks, benefits and alternatives for the proposed anesthesia with the patient or authorized representative who has indicated his/her understanding and acceptance.   Dental advisory given  Plan Discussed with: CRNA  Anesthesia Plan Comments:        Anesthesia Quick Evaluation                                  Anesthesia Evaluation  Patient identified by MRN, date of birth, ID band Patient awake    Reviewed: Allergy & Precautions, H&P , NPO status , Patient's Chart, lab work & pertinent test results  Airway Mallampati: II TM Distance:  >3 FB Neck ROM: Full    Dental no notable dental hx.    Pulmonary neg pulmonary ROS,  breath sounds clear to auscultation  Pulmonary exam normal       Cardiovascular negative cardio ROS  Rhythm:Regular Rate:Normal     Neuro/Psych negative neurological ROS  negative psych ROS   GI/Hepatic negative GI ROS, Neg liver ROS,   Endo/Other  negative endocrine ROS  Renal/GU negative Renal ROS  negative genitourinary   Musculoskeletal negative musculoskeletal ROS (+)   Abdominal   Peds negative pediatric ROS (+)  Hematology negative hematology ROS (+)   Anesthesia Other Findings   Reproductive/Obstetrics negative OB ROS                           Anesthesia Physical Anesthesia Plan  ASA: II  Anesthesia Plan: Spinal   Post-op Pain Management:    Induction:   Airway Management Planned: Simple Face Mask  Additional Equipment:   Intra-op Plan:   Post-operative Plan:   Informed Consent: I have reviewed the patients History and Physical, chart, labs and discussed the procedure including the risks, benefits and alternatives for the proposed anesthesia with the patient or authorized representative who has indicated his/her understanding and acceptance.   Dental advisory given  Plan Discussed with: CRNA  Anesthesia Plan Comments:  Anesthesia Quick Evaluation

## 2014-03-25 NOTE — Op Note (Signed)
Surgeon: Donaciano Eva   Assistants: Dr Lahoma Crocker (due to the complexity of the case and hospital policies regarding bedside assistance during robotic procedures an MD assistant was necessary for port placement, manipulation of tissues, and robotic instrument management).  Anesthesia: General endotracheal anesthesia  ASA Class: 3   Pre-operative Diagnosis: uterine mass  Post-operative Diagnosis: leiomyoma on frozen  Operation: Robotic-assisted laparoscopic hysterectomy wit left salpingoophorectomy   Surgeon: Donaciano Eva  Assistant Surgeon: Lahoma Crocker MD  Anesthesia: GET  Urine Output: 200cc  Operative Findings:  : 12 week size uterus with large anterior myoma (soft) with no intraperitoneal disease (required morcellation within bag for vaginal extraction). Surgically absent right tube and ovary  Procedure Details  The patient was seen in the Holding Room. The risks, benefits, complications, treatment options, and expected outcomes were discussed with the patient.  The patient concurred with the proposed plan, giving informed consent.  The site of surgery properly noted/marked. The patient was identified as Meredith Moore and the procedure verified as a Robotic-assisted hysterectomy with bilateral salpingo oophorectomy. A Time Out was held and the above information confirmed.  After induction of anesthesia, the patient was draped and prepped in the usual sterile manner. Pt was placed in supine position after anesthesia and draped and prepped in the usual sterile manner. The abdominal drape was placed after the CholoraPrep had been allowed to dry for 3 minutes.  Her arms were tucked to her side with all appropriate precautions.  The chest was secured to the table.  The patient was placed in the semi-lithotomy position in [place].  The perineum was prepped with Betadine.  Foley catheter was placed.  A sterile speculum was placed in the vagina.   The cervix was grasped with a single-tooth tenaculum and dilated with Kennon Rounds dilators.  The ZUMI uterine manipulator with a medium colpotomizer ring was placed without difficulty.  A pneum occluder balloon was placed over the manipulator.  A second time-out was performed.  OG tube placement was confirmed and to suction.     Procedure:  The patient was brought to the operating room where general anesthesia was administered with no complications.  The patient was placed in the dorsal lithotomy position in padded Allen stirrups.  The arms were tucked at the sides with gel pads protecting the elbows and foam protecting the hands. The patient was then prepped.  A Foley was placed to gravity.  small size KOH ring was used to place around the cervix after the cervix had been dilated and then a RUMI manipulator was attached in the normal manner.  The patient was then draped in the normal manner.  Next, a 5 mm skin incision was made 1 cm below the subcostal margin in the midclavicular line.  The 5 mm Optiview port and scope was used for direct entry.  Opening pressure was under 10 mm CO2.  The abdomen was insufflated and the findings were noted as above.   At this point and all points during the procedure, the patient's intra-abdominal pressure did not exceed 15 mmHg. Next, a 10 mm skin incision was made in the umbilicus and a right and left port was placed about 10 cm lateral to the umbilical port on the right and left side.  All ports were placed under direct visualization.  The patient was placed in steep Trendelenburg.  Bowel was away into the upper abdomen.  The robot was docked in the normal manner.  The hysterectomy was  started after the round ligament on the right side was incised and the retroperitoneum was entered and the pararectal space was developed.  The ureter was noted to be on the medial leaf of the broad ligament.  The peritoneum above the ureter was incised and stretched and the infundibulopelvic  ligament was skeletonized, cauterized and cut.  The posterior peritoneum was taken down to the level of the KOH ring.  The anterior peritoneum was also taken down.  The bladder flap was created to the level of the KOH ring.  The uterine artery on the right side was skeletonized, cauterized and cut in the normal manner.  A similar procedure was performed on the left.  The colpotomy was made and the uterus, cervix, bilateral ovaries and tubes were amputated and delivered through the vagina. Because of the large size of the specimen and small vaginal colpotomy, a 15cm bag was deployed vaginally, and the uterus was placed within it. Morcellation of the specimen took place for 45 minutes (within the bag).  Pedicles were inspected and excellent hemostasis was achieved.    The colpotomy at the vaginal cuff was closed with Vicryl on a CT1 needle in a running manner.  Irrigation was used and excellent hemostasis was achieved.  At this point in the procedure was completed.  Robotic instruments were removed under direct visulaization.  The robot was undocked. The 10 mm ports were closed with Vicryl on a UR-5 needle and the fascia was closed with 0 Vicryl on a UR-5 needle.  The skin was closed with 4-0 Vicryl in a subcuticular manner.  Steri-Strips were applied and bandages were also applied.  Sponge, lap and needle counts correct x 2.  The patient was taken to the recovery room in stable condition.  The vagina was swabbed with  minimal bleeding noted.   All instrument and needle counts were correct x  3.   The patient was transferred to the recovery room in stable condition.  Estimated Blood Loss:  less than 50 mL      Total IV Fluids: 1,000 ml         Specimens: uterus, cervix, left tube and ovary         Complications:  None; patient tolerated the procedure well.         Disposition: PACU - hemodynamically stable.

## 2014-03-25 NOTE — Transfer of Care (Signed)
Immediate Anesthesia Transfer of Care Note  Patient: Meredith Moore  Procedure(s) Performed: Procedure(s): ROBOTIC ASSISTED TOTAL HYSTERECTOMY WITH BILATERAL SALPINGO OOPHORECTOMY (Bilateral)  Patient Location: PACU  Anesthesia Type:General  Level of Consciousness: awake, sedated and patient cooperative  Airway & Oxygen Therapy: Patient Spontanous Breathing and Patient connected to face mask oxygen  Post-op Assessment: Report given to PACU RN and Post -op Vital signs reviewed and stable  Post vital signs: Reviewed and stable  Complications: No apparent anesthesia complications

## 2014-03-25 NOTE — Interval H&P Note (Signed)
History and Physical Interval Note:  03/25/2014 7:21 AM  Meredith Moore  has presented today for surgery, with the diagnosis of enlarged uterus   The various methods of treatment have been discussed with the patient and family. After consideration of risks, benefits and other options for treatment, the patient has consented to  Procedure(s): ROBOTIC ASSISTED TOTAL HYSTERECTOMY WITH BILATERAL SALPINGO OOPHORECTOMY (Bilateral) as a surgical intervention .  The patient's history has been reviewed, patient examined, no change in status, stable for surgery.  I have reviewed the patient's chart and labs.  Questions were answered to the patient's satisfaction.     Donaciano Eva

## 2014-03-25 NOTE — Anesthesia Postprocedure Evaluation (Signed)
  Anesthesia Post-op Note  Patient: Meredith Moore  Procedure(s) Performed: Procedure(s) (LRB): ROBOTIC ASSISTED TOTAL HYSTERECTOMY WITH BILATERAL SALPINGO OOPHORECTOMY (Bilateral)  Patient Location: PACU  Anesthesia Type: General  Level of Consciousness: awake and alert   Airway and Oxygen Therapy: Patient Spontanous Breathing  Post-op Pain: mild  Post-op Assessment: Post-op Vital signs reviewed, Patient's Cardiovascular Status Stable, Respiratory Function Stable, Patent Airway and No signs of Nausea or vomiting  Last Vitals:  Filed Vitals:   03/25/14 1330  BP: 94/56  Pulse: 63  Temp: 36.3 C  Resp: 16    Post-op Vital Signs: stable   Complications: No apparent anesthesia complications

## 2014-03-26 ENCOUNTER — Encounter (HOSPITAL_COMMUNITY): Payer: Self-pay | Admitting: Gynecologic Oncology

## 2014-03-26 DIAGNOSIS — D259 Leiomyoma of uterus, unspecified: Secondary | ICD-10-CM | POA: Diagnosis not present

## 2014-03-26 LAB — BASIC METABOLIC PANEL
Anion gap: 13 (ref 5–15)
BUN: 15 mg/dL (ref 6–23)
CALCIUM: 8.4 mg/dL (ref 8.4–10.5)
CO2: 22 mEq/L (ref 19–32)
Chloride: 99 mEq/L (ref 96–112)
Creatinine, Ser: 1.01 mg/dL (ref 0.50–1.10)
GFR, EST AFRICAN AMERICAN: 62 mL/min — AB (ref >90)
GFR, EST NON AFRICAN AMERICAN: 53 mL/min — AB (ref >90)
Glucose, Bld: 128 mg/dL — ABNORMAL HIGH (ref 70–99)
POTASSIUM: 4.3 meq/L (ref 3.7–5.3)
Sodium: 134 mEq/L — ABNORMAL LOW (ref 137–147)

## 2014-03-26 LAB — CBC
HCT: 27.6 % — ABNORMAL LOW (ref 36.0–46.0)
Hemoglobin: 9 g/dL — ABNORMAL LOW (ref 12.0–15.0)
MCH: 29.7 pg (ref 26.0–34.0)
MCHC: 32.6 g/dL (ref 30.0–36.0)
MCV: 91.1 fL (ref 78.0–100.0)
PLATELETS: 216 10*3/uL (ref 150–400)
RBC: 3.03 MIL/uL — AB (ref 3.87–5.11)
RDW: 13.2 % (ref 11.5–15.5)
WBC: 8.9 10*3/uL (ref 4.0–10.5)

## 2014-03-26 MED ORDER — SODIUM CHLORIDE 0.9 % IV BOLUS (SEPSIS)
500.0000 mL | Freq: Once | INTRAVENOUS | Status: AC
Start: 2014-03-26 — End: 2014-03-26
  Administered 2014-03-26: 500 mL via INTRAVENOUS

## 2014-03-26 NOTE — Discharge Instructions (Signed)
03/26/2014  Return to work: 4-6 weeks if applicable  Activity: 1. Be up and out of the bed during the day.  Take a nap if needed.  You may walk up steps but be careful and use the hand rail.  Stair climbing will tire you more than you think, you may need to stop part way and rest.   2. No lifting or straining for 6 weeks.  3. No driving for 1 week.  Do not drive if you are taking narcotic pain medicine.  4. Shower daily.  Use soap and water on your incision and pat dry; don't rub.   5. No sexual activity and nothing in the vagina for 8 weeks.  Diet: 1. Low sodium Heart Healthy Diet is recommended.  2. It is safe to use a laxative if you have difficulty moving your bowels.   Wound Care: 1. Keep clean and dry.  Shower daily.  Reasons to call the Doctor:  Fever - Oral temperature greater than 100.4 degrees Fahrenheit  Foul-smelling vaginal discharge  Difficulty urinating  Nausea and vomiting  Increased pain at the site of the incision that is unrelieved with pain medicine.  Difficulty breathing with or without chest pain  New calf pain especially if only on one side  Sudden, continuing increased vaginal bleeding with or without clots.   Contacts: For questions or concerns you should contact:  Dr. Lahoma Crocker at [phone]  Dr. Denman George at [place]  Joylene John, NP at [phone]

## 2014-03-26 NOTE — Discharge Summary (Signed)
Physician Discharge Summary  Patient ID: SHOSHANNAH FAUBERT MRN: [id number] DOB/AGE: 1936/02/16 78 y.o.  Admit date: 03/25/2014 Discharge date: 03/26/2014  Admission Diagnoses: Leiomyoma of uterus, unspecified  Discharge Diagnoses:  Principal Problem:   Leiomyoma of uterus, unspecified Active Problems:   Uterine mass   Discharged Condition:  The patient is in good condition and stable for discharge.    Hospital Course: On 03/25/2014, the patient underwent the following: Procedure(s):  ROBOTIC ASSISTED TOTAL HYSTERECTOMY WITH BILATERAL SALPINGO OOPHORECTOMY.  The postoperative course was uneventful.  She was discharged to home on postoperative day 1 tolerating a regular diet.  Consults: None  Significant Diagnostic Studies: None  Treatments: surgery: see above  Discharge Exam: Blood pressure 93/55, pulse 63, temperature 98.7 F (37.1 C), temperature source Oral, resp. rate 16, height 5' 3.5" (1.613 m), weight 141 lb (63.957 kg), SpO2 98.00%. General appearance: alert, cooperative and no distress Resp: clear to auscultation bilaterally Cardio: regular rate and rhythm, S1, S2 normal, no murmur, click, rub or gallop GI: soft, non-tender; bowel sounds normal; no masses,  no organomegaly Extremities: extremities normal, atraumatic, no cyanosis or edema Incision/Wound: Lap sites with dermabond to the abdomen clean and intact, no bleeding or drainage  Disposition: Home  Discharge Instructions   Call MD for:  difficulty breathing, headache or visual disturbances    Complete by:  As directed      Call MD for:  extreme fatigue    Complete by:  As directed      Call MD for:  hives    Complete by:  As directed      Call MD for:  persistant dizziness or light-headedness    Complete by:  As directed      Call MD for:  persistant nausea and vomiting    Complete by:  As directed      Call MD for:  redness, tenderness, or signs of infection (pain, swelling, redness, odor or  green/yellow discharge around incision site)    Complete by:  As directed      Call MD for:  severe uncontrolled pain    Complete by:  As directed      Call MD for:  temperature >100.4    Complete by:  As directed      Diet - low sodium heart healthy    Complete by:  As directed      Driving Restrictions    Complete by:  As directed   No driving for 1 week.  Do not take narcotics and drive.     Increase activity slowly    Complete by:  As directed      Lifting restrictions    Complete by:  As directed   No lifting greater than 10 lbs.     Sexual Activity Restrictions    Complete by:  As directed   No sexual activity, nothing in the vagina, for 8 weeks.            Medication List         atorvastatin 10 MG tablet  Commonly known as:  LIPITOR  Take 5 mg by mouth daily.     CALCIUM 1200 PO  Take 1 tablet by mouth daily. Take one tablet by mouth once daily     doxylamine (Sleep) 25 MG tablet  Commonly known as:  UNISOM  Take 25 mg by mouth at bedtime as needed for sleep.     EX-LAX PO  Take 1 tablet by mouth 2 (two) times  daily as needed (Constipation).     Fish Oil 1200 MG Caps  Take 1,200 mg by mouth 2 (two) times daily.     HYDROcodone-acetaminophen 5-325 MG per tablet  Commonly known as:  NORCO/VICODIN  Take 0.5-1 tablets by mouth every 6 (six) hours as needed for moderate pain.     ICAPS Caps  Take 1 each by mouth 2 (two) times daily.     latanoprost 0.005 % ophthalmic solution  Commonly known as:  XALATAN  Place 1 drop into both eyes at bedtime.     Lutein 6 MG Caps  Take 6 mg by mouth 2 (two) times daily.     promethazine 25 MG tablet  Commonly known as:  PHENERGAN  Take 25 mg by mouth every 6 (six) hours as needed for nausea or vomiting.     Tdap 5-2.5-18.5 LF-MCG/0.5 injection  Commonly known as:  BOOSTRIX  Inject 0.5 mLs into the muscle once.     timolol 0.5 % ophthalmic solution  Commonly known as:  TIMOPTIC  Place 1 drop into both eyes 2  (two) times daily.           Follow-up Information   Follow up with Donaciano Eva, MD On 05/05/2014. (at 10:30am at the [facility])    Specialty:  Obstetrics and Gynecology   Contact information:   [place] [address] [phone]       Greater than thirty minutes were spend for face to face discharge instructions and discharge orders/summary in EPIC.   Signed: CROSS, MELISSA DEAL 03/26/2014, 9:48 AM

## 2014-04-01 ENCOUNTER — Telehealth: Payer: Self-pay | Admitting: Gynecologic Oncology

## 2014-04-01 NOTE — Telephone Encounter (Signed)
Post op telephone call to check patient status.  Informed of pathology.  Patient describes expected post operative status.  Adequate PO intake reported.  Bowels and bladder functioning without difficulty.  Pain minimal.  Reportable signs and symptoms reviewed.  Follow up appt given.

## 2014-04-24 ENCOUNTER — Encounter: Payer: Self-pay | Admitting: Nurse Practitioner

## 2014-04-24 ENCOUNTER — Ambulatory Visit (INDEPENDENT_AMBULATORY_CARE_PROVIDER_SITE_OTHER): Payer: Medicare Other | Admitting: Nurse Practitioner

## 2014-04-24 VITALS — BP 128/80 | HR 78 | Temp 97.8°F | Resp 18 | Ht 63.5 in | Wt 143.6 lb

## 2014-04-24 DIAGNOSIS — M1611 Unilateral primary osteoarthritis, right hip: Secondary | ICD-10-CM

## 2014-04-24 DIAGNOSIS — Z23 Encounter for immunization: Secondary | ICD-10-CM

## 2014-04-24 DIAGNOSIS — M1610 Unilateral primary osteoarthritis, unspecified hip: Secondary | ICD-10-CM

## 2014-04-24 NOTE — Progress Notes (Signed)
Patient ID: Meredith Moore, female   DOB: [birth year], 78 y.o.   MRN: [id number]    Allergies  Allergen Reactions  . Formaldehyde Rash  . Nickel Rash    Chief Complaint  Patient presents with  . Medical Management of Chronic Issues    HPI: Patient is a 78 y.o. female seen in the office today for surgical clearance for hip replacement on right. Started in march to have groin pain. Went to ortho and they did MRI to further evaluate pain. Aug 25th had a robotic hysterectomy due to mass in uterus found on MRI when looking at her hip - hysterectomy was uncomplicated. Now she is scheduled for surgery schedule for nov 4th    Review of Systems:  Review of Systems  Constitutional: Negative for fever, chills and malaise/fatigue.  HENT: Negative for congestion, ear pain, sore throat and tinnitus.   Eyes:       Has advanced macular degeneration and glaucoma  Respiratory: Negative for cough, sputum production and shortness of breath.   Cardiovascular: Negative for chest pain, palpitations and orthopnea.  Gastrointestinal: Negative for heartburn, abdominal pain, diarrhea and constipation.  Genitourinary: Negative for dysuria, urgency and frequency.  Musculoskeletal: Positive for joint pain (hip pain). Negative for falls, myalgias and neck pain.  Skin: Negative.   Neurological: Negative for dizziness, tingling, sensory change, speech change, weakness and headaches.  Psychiatric/Behavioral: Negative for depression. The patient is not nervous/anxious and does not have insomnia.      Past Medical History  Diagnosis Date  . Arthritis   . Glaucoma   . Macular degeneration   . Osteoarthrosis, unspecified whether generalized or localized, unspecified site   . Pain in limb   . Herpes zoster with unspecified complication   . Anemia, unspecified   . Hyperlipidemia   . History of transfusion    Past Surgical History  Procedure Laterality Date  . Tonsillectomy      1945   . Ovarian tumor        benign tumor removed   . Appendectomy    . Total knee arthroplasty Right 09/10/2012    Procedure: RIGHT TOTAL KNEE ARTHROPLASTY;  Surgeon: Gearlean Alf, MD;  Location: [facility];  Service: Orthopedics;  Laterality: Right;  . Belpharoptosis repair    . Robotic assisted total hysterectomy with bilateral salpingo oopherectomy Bilateral 03/25/2014    Procedure: ROBOTIC ASSISTED TOTAL HYSTERECTOMY WITH BILATERAL SALPINGO OOPHORECTOMY;  Surgeon: Everitt Amber, MD;  Location: [facility];  Service: Gynecology;  Laterality: Bilateral;   Social History:   reports that she quit smoking about 25 years ago. She has never used smokeless tobacco. She reports that she does not drink alcohol or use illicit drugs.  No family history on file.  Medications: Patient's Medications  New Prescriptions   No medications on file  Previous Medications   ATORVASTATIN (LIPITOR) 10 MG TABLET    Take 5 mg by mouth daily.   CALCIUM CARBONATE-VIT D-MIN (CALCIUM 1200 PO)    Take 1 tablet by mouth daily. Take one tablet by mouth once daily   DOXYLAMINE, SLEEP, (UNISOM) 25 MG TABLET    Take 25 mg by mouth at bedtime as needed for sleep.   HYDROCODONE-ACETAMINOPHEN (NORCO/VICODIN) 5-325 MG PER TABLET    Take 0.5-1 tablets by mouth every 6 (six) hours as needed for moderate pain.   LATANOPROST (XALATAN) 0.005 % OPHTHALMIC SOLUTION    Place 1 drop into both eyes at bedtime.   LUTEIN 6 MG CAPS  Take 6 mg by mouth 2 (two) times daily.   MULTIPLE VITAMINS-MINERALS (ICAPS) CAPS    Take 1 each by mouth 2 (two) times daily.    OMEGA-3 FATTY ACIDS (FISH OIL) 1200 MG CAPS    Take 1,200 mg by mouth 2 (two) times daily.    PROMETHAZINE (PHENERGAN) 25 MG TABLET    Take 25 mg by mouth every 6 (six) hours as needed for nausea or vomiting.   SENNOSIDES (EX-LAX PO)    Take 1 tablet by mouth 2 (two) times daily as needed (Constipation).   TDAP (BOOSTRIX) 5-2.5-18.5 LF-MCG/0.5 INJECTION    Inject 0.5 mLs into the muscle once.   TIMOLOL  (TIMOPTIC) 0.5 % OPHTHALMIC SOLUTION    Place 1 drop into both eyes 2 (two) times daily.  Modified Medications   No medications on file  Discontinued Medications   No medications on file     Physical Exam:  Filed Vitals:   04/24/14 1430  BP: 128/80  Pulse: 78  Temp: 97.8 F (36.6 C)  TempSrc: Oral  Resp: 18  Height: 5' 3.5" (1.613 m)  Weight: 143 lb 9.6 oz (65.137 kg)  SpO2: 99%    Physical Exam  Constitutional: She is oriented to person, place, and time and well-developed, well-nourished, and in no distress. No distress.  HENT:  Head: Normocephalic and atraumatic.  Right Ear: External ear normal.  Left Ear: External ear normal.  Nose: Nose normal.  Mouth/Throat: Oropharynx is clear and moist. No oropharyngeal exudate.  Eyes: Conjunctivae are normal.  Neck: Normal range of motion. Neck supple. No JVD present. No thyromegaly present.  Cardiovascular: Normal rate, regular rhythm, normal heart sounds and intact distal pulses.   Pulmonary/Chest: Effort normal and breath sounds normal. No respiratory distress.  Abdominal: Soft. Bowel sounds are normal. She exhibits no distension.  Musculoskeletal: Normal range of motion. She exhibits no edema.  Lymphadenopathy:    She has no cervical adenopathy.  Neurological: She is alert and oriented to person, place, and time.  Skin: Skin is warm and dry. She is not diaphoretic. No erythema.  Psychiatric: Affect normal.     Labs reviewed: Basic Metabolic Panel:  Recent Labs  09/13/13 0838 03/20/14 1410 03/26/14 0453  NA 142 137 134*  K 5.2 4.5 4.3  CL 102 101 99  CO2 25 25 22   GLUCOSE 92 101* 128*  BUN 14 16 15   CREATININE 1.00 0.85 1.01  CALCIUM 10.0 9.7 8.4   Liver Function Tests:  Recent Labs  09/13/13 0838 03/20/14 1410  AST 20 20  ALT 17 16  ALKPHOS 65 64  BILITOT 0.3 0.3  PROT 6.4 7.2  ALBUMIN  --  3.8   No results found for this basename: LIPASE, AMYLASE,  in the last 8760 hours No results found for  this basename: AMMONIA,  in the last 8760 hours CBC:  Recent Labs  09/13/13 0838 03/20/14 1410 03/26/14 0453  WBC 6.0 7.0 8.9  NEUTROABS 3.0 4.5  --   HGB 12.3 11.8* 9.0*  HCT 37.2 36.3 27.6*  MCV 91 91.4 91.1  PLT 290 297 216   Lipid Panel:  Recent Labs  09/13/13 0838  HDL 73  LDLCALC 97  TRIG 111  CHOLHDL 2.6    Assessment/Plan   1. Need for prophylactic vaccination and inoculation against influenza Flu vaccine given  2. Primary osteoarthritis of right hip -pre-op exam done, without major risk factors therefore cardiac clearance given - EKG normal -will get  Basic metabolic panel  andCBC With differential/Platelet

## 2014-04-25 LAB — CBC WITH DIFFERENTIAL
Basophils Absolute: 0 10*3/uL (ref 0.0–0.2)
Basos: 1 %
Eos: 5 %
Eosinophils Absolute: 0.4 10*3/uL (ref 0.0–0.4)
HEMATOCRIT: 34.5 % (ref 34.0–46.6)
Hemoglobin: 11.4 g/dL (ref 11.1–15.9)
IMMATURE GRANULOCYTES: 0 %
Immature Grans (Abs): 0 10*3/uL (ref 0.0–0.1)
LYMPHS: 33 %
Lymphocytes Absolute: 2.6 10*3/uL (ref 0.7–3.1)
MCH: 29.7 pg (ref 26.6–33.0)
MCHC: 33 g/dL (ref 31.5–35.7)
MCV: 90 fL (ref 79–97)
MONOCYTES: 8 %
Monocytes Absolute: 0.6 10*3/uL (ref 0.1–0.9)
NEUTROS PCT: 53 %
Neutrophils Absolute: 4.3 10*3/uL (ref 1.4–7.0)
PLATELETS: 289 10*3/uL (ref 150–379)
RBC: 3.84 x10E6/uL (ref 3.77–5.28)
RDW: 14.2 % (ref 12.3–15.4)
WBC: 7.9 10*3/uL (ref 3.4–10.8)

## 2014-04-25 LAB — BASIC METABOLIC PANEL
BUN/Creatinine Ratio: 21 (ref 11–26)
BUN: 17 mg/dL (ref 8–27)
CALCIUM: 9.8 mg/dL (ref 8.7–10.3)
CHLORIDE: 100 mmol/L (ref 97–108)
CO2: 25 mmol/L (ref 18–29)
Creatinine, Ser: 0.82 mg/dL (ref 0.57–1.00)
GFR calc Af Amer: 81 mL/min/{1.73_m2} (ref >59)
GFR, EST NON AFRICAN AMERICAN: 70 mL/min/{1.73_m2} (ref >59)
GLUCOSE: 98 mg/dL (ref 65–99)
POTASSIUM: 5.3 mmol/L — AB (ref 3.5–5.2)
Sodium: 139 mmol/L (ref 134–144)

## 2014-05-05 ENCOUNTER — Encounter: Payer: Self-pay | Admitting: Gynecologic Oncology

## 2014-05-05 ENCOUNTER — Ambulatory Visit: Payer: Medicare Other | Attending: Gynecologic Oncology | Admitting: Gynecologic Oncology

## 2014-05-05 VITALS — BP 126/60 | HR 64 | Temp 97.8°F | Resp 18 | Wt 143.0 lb

## 2014-05-05 DIAGNOSIS — Z09 Encounter for follow-up examination after completed treatment for conditions other than malignant neoplasm: Secondary | ICD-10-CM | POA: Diagnosis present

## 2014-05-05 DIAGNOSIS — Z48816 Encounter for surgical aftercare following surgery on the genitourinary system: Secondary | ICD-10-CM

## 2014-05-05 DIAGNOSIS — N858 Other specified noninflammatory disorders of uterus: Secondary | ICD-10-CM

## 2014-05-05 DIAGNOSIS — D250 Submucous leiomyoma of uterus: Secondary | ICD-10-CM

## 2014-05-05 NOTE — Patient Instructions (Signed)
Please call out office if you have any concerns in the future.

## 2014-05-05 NOTE — Progress Notes (Signed)
  HPI:  Meredith Moore is a 78 y.o. year old No obstetric history on file. initially seen in consultation on 02/27/14 for a uterine mass that had concerning features on MRI.  She then underwent a robotic hysterectomy BSO on 11/17/60 without complications.  Her postoperative course was uncomplicated.  Her final pathology revealed a symplatic leiomyoma of the uterus with benign ovaries and tubes.  She is seen today for a postoperative check and to discuss her pathology results and ongoing plan.  Since discharge from the hospital, she is feeling well.  She has improving appetite, normal bowel and bladder function, and pain controlled with minimal PO medication. She has no other complaints today.    Review of systems: Constitutional:  She has no weight gain or weight loss. She has no fever or chills. Eyes: No blurred vision Ears, Nose, Mouth, Throat: No dizziness, headaches or changes in hearing. No mouth sores. Cardiovascular: No chest pain, palpitations or edema. Respiratory:  No shortness of breath, wheezing or cough Gastrointestinal: She has normal bowel movements without diarrhea or constipation. She denies any nausea or vomiting. She denies blood in her stool or heart burn. Genitourinary:  She denies pelvic pain, pelvic pressure or changes in her urinary function. She has no hematuria, dysuria, or incontinence. She has no irregular vaginal bleeding or vaginal discharge Musculoskeletal: Denies muscle weakness or joint pains.  Skin:  She has no skin changes, rashes or itching Neurological:  Denies dizziness or headaches. No neuropathy, no numbness or tingling. Psychiatric:  She denies depression or anxiety. Hematologic/Lymphatic:   No easy bruising or bleeding   Physical Exam: Blood pressure 126/60, pulse 64, temperature 97.8 F (36.6 C), temperature source Oral, resp. rate 18, weight 143 lb (64.864 kg). General: Well dressed, well nourished in no apparent distress.   HEENT:   Normocephalic and atraumatic, no lesions.  Extraocular muscles intact. Sclerae anicteric. Pupils equal, round, reactive. No mouth sores or ulcers. Thyroid is normal size, not nodular, midline. Skin:  No lesions or rashes. Breasts:  Soft, symmetric.  No skin or nipple changes.  No palpable LN or masses. Lungs:  Clear to auscultation bilaterally.  No wheezes. Cardiovascular:  Regular rate and rhythm.  No murmurs or rubs. Abdomen:  Soft, nontender, nondistended.  No palpable masses.  No hepatosplenomegaly.  No ascites. Normal bowel sounds.  No hernias.  Incisions are well healed Genitourinary: Normal EGBUS  Vaginal cuff intact.  No bleeding or discharge.  No cul de sac fullness. Extremities: No cyanosis, clubbing or edema.  No calf tenderness or erythema. No palpable cords. Psychiatric: Mood and affect are appropriate. Neurological: Awake, alert and oriented x 3. Sensation is intact, no neuropathy.  Musculoskeletal: No pain, normal strength and range of motion.  Assessment:    78 y.o. year old with a history of a symplatic leiomyoma (benign).   S/p robotic hysterectomy and BSO on 03/25/14.   Plan: 1) Pathology reports reviewed today 2) Treatment counseling - despite the atypical nuclei in the degenerative changes of the fibroid, given the absence of necrosis and mitotic activity, this pathology is not consistent with sarcoma and no adjuvant therapy or specific followup is necessary. She was given the opportunity to ask questions, which were answered to her satisfaction, and she is agreement with the above mentioned plan of care. She is cleared for all activity including intercourse.  3)  Return to clinic prn

## 2014-05-08 ENCOUNTER — Telehealth: Payer: Self-pay | Admitting: *Deleted

## 2014-05-08 NOTE — Telephone Encounter (Signed)
Patient called wanting her Bloodwork results from 04/24/2014. I called patient and she just wanted to make sure the results will go to the Orthopedic Office.

## 2014-05-15 ENCOUNTER — Other Ambulatory Visit: Payer: Self-pay | Admitting: Orthopedic Surgery

## 2014-05-15 NOTE — Progress Notes (Signed)
Preoperative surgical orders have been place into the Epic hospital system for Meredith Moore on 05/15/2014, 1:04 PM  by Mickel Crow for surgery on 06/04/2014.  Preop Total Hip - Anterior Approach orders including Experel Injecion, IV Tylenol, and IV Decadron as long as there are no contraindications to the above medications. Arlee Muslim, PA-C

## 2014-05-22 ENCOUNTER — Encounter (HOSPITAL_COMMUNITY): Payer: Self-pay | Admitting: Pharmacy Technician

## 2014-05-27 ENCOUNTER — Encounter (HOSPITAL_COMMUNITY)
Admission: RE | Admit: 2014-05-27 | Discharge: 2014-05-27 | Disposition: A | Discharge status: Home / Self Care | Payer: Medicare Other | Source: Ambulatory Visit | Attending: Orthopedic Surgery | Admitting: Orthopedic Surgery

## 2014-05-27 ENCOUNTER — Encounter (HOSPITAL_COMMUNITY): Payer: Self-pay

## 2014-05-27 DIAGNOSIS — Z01812 Encounter for preprocedural laboratory examination: Secondary | ICD-10-CM | POA: Insufficient documentation

## 2014-05-27 HISTORY — DX: Other specified congenital deformities of hip: Q65.89

## 2014-05-27 HISTORY — DX: Pain in right hip: M25.551

## 2014-05-27 HISTORY — DX: Adverse effect of unspecified anesthetic, initial encounter: T41.45XA

## 2014-05-27 HISTORY — DX: Other complications of anesthesia, initial encounter: T88.59XA

## 2014-05-27 LAB — URINALYSIS, ROUTINE W REFLEX MICROSCOPIC
Bilirubin Urine: NEGATIVE
Glucose, UA: NEGATIVE mg/dL
HGB URINE DIPSTICK: NEGATIVE
Ketones, ur: NEGATIVE mg/dL
LEUKOCYTES UA: NEGATIVE
Nitrite: NEGATIVE
PH: 5.5 (ref 5.0–8.0)
Protein, ur: NEGATIVE mg/dL
Specific Gravity, Urine: 1.005 (ref 1.005–1.030)
Urobilinogen, UA: 0.2 mg/dL (ref 0.0–1.0)

## 2014-05-27 LAB — COMPREHENSIVE METABOLIC PANEL
ALBUMIN: 3.8 g/dL (ref 3.5–5.2)
ALT: 19 U/L (ref 0–35)
AST: 23 U/L (ref 0–37)
Alkaline Phosphatase: 81 U/L (ref 39–117)
Anion gap: 11 (ref 5–15)
BUN: 18 mg/dL (ref 6–23)
CO2: 25 mEq/L (ref 19–32)
Calcium: 9.6 mg/dL (ref 8.4–10.5)
Chloride: 99 mEq/L (ref 96–112)
Creatinine, Ser: 0.91 mg/dL (ref 0.50–1.10)
GFR calc Af Amer: 70 mL/min — ABNORMAL LOW (ref >90)
GFR calc non Af Amer: 60 mL/min — ABNORMAL LOW (ref >90)
GLUCOSE: 122 mg/dL — AB (ref 70–99)
POTASSIUM: 5.1 meq/L (ref 3.7–5.3)
SODIUM: 135 meq/L — AB (ref 137–147)
TOTAL PROTEIN: 7.2 g/dL (ref 6.0–8.3)
Total Bilirubin: 0.3 mg/dL (ref 0.3–1.2)

## 2014-05-27 LAB — SURGICAL PCR SCREEN
MRSA, PCR: NEGATIVE
Staphylococcus aureus: NEGATIVE

## 2014-05-27 LAB — CBC
HEMATOCRIT: 34.7 % — AB (ref 36.0–46.0)
HEMOGLOBIN: 11.3 g/dL — AB (ref 12.0–15.0)
MCH: 29.5 pg (ref 26.0–34.0)
MCHC: 32.6 g/dL (ref 30.0–36.0)
MCV: 90.6 fL (ref 78.0–100.0)
Platelets: 291 10*3/uL (ref 150–400)
RBC: 3.83 MIL/uL — ABNORMAL LOW (ref 3.87–5.11)
RDW: 12.8 % (ref 11.5–15.5)
WBC: 6.7 10*3/uL (ref 4.0–10.5)

## 2014-05-27 LAB — PROTIME-INR
INR: 1.05 (ref 0.00–1.49)
Prothrombin Time: 13.9 seconds (ref 11.6–15.2)

## 2014-05-27 LAB — APTT: APTT: 35 s (ref 24–37)

## 2014-05-27 NOTE — Progress Notes (Signed)
Surgery clearance note 04/24/14 Dr. Nyoka Cowden on chart, EKG 04/24/14 on EPIC and chart, lab results 04/24/14 on chart

## 2014-05-27 NOTE — Patient Instructions (Addendum)
[place]  05/27/2014   Your procedure is scheduled on: Wednesday 06/04/14  Report to [facility] at 11:00 AM.  Call this number if you have problems the morning of surgery [phone]: [phone]   Remember:   Do not eat food or drink liquids After Midnight.     Take these medicines the morning of surgery with A SIP OF WATER: hydrocodone if needed, timolol eye drops   Do not wear jewelry, make-up or nail polish.  Do not wear lotions, powders, or perfumes.  Do not shave 48 hours prior to surgery. Men may shave face and neck.  Do not bring valuables to the hospital.  Contacts, dentures or bridgework may not be worn into surgery.  Leave suitcase in the car. After surgery it may be brought to your room.  For patients admitted to the hospital, checkout time is 11:00 AM the day of discharge.   Please read over the following fact sheets that you were given: MRSA Information Paulette Blanch, RN  pre op nurse call if needed [phone]    [facility] - Preparing for Surgery Before surgery, you can play an important role.  Because skin is not sterile, your skin needs to be as free of germs as possible.  You can reduce the number of germs on your skin by washing with CHG (chlorahexidine gluconate) soap before surgery.  CHG is an antiseptic cleaner which kills germs and bonds with the skin to continue killing germs even after washing. Please DO NOT use if you have an allergy to CHG or antibacterial soaps.  If your skin becomes reddened/irritated stop using the CHG and inform your nurse when you arrive at Short Stay. Do not shave (including legs and underarms) for at least 48 hours prior to the first CHG shower.  You may shave your face/neck. Please follow these instructions carefully:  1.  Shower with CHG Soap the night before surgery and the  morning of Surgery.  2.  If you choose to wash your hair, wash your hair first as usual with your  normal  shampoo.  3.  After you  shampoo, rinse your hair and body thoroughly to remove the  shampoo.                            4.  Use CHG as you would any other liquid soap.  You can apply chg directly  to the skin and wash                       Gently with a scrungie or clean washcloth.  5.  Apply the CHG Soap to your body ONLY FROM THE NECK DOWN.   Do not use on face/ open                           Wound or open sores. Avoid contact with eyes, ears mouth and genitals (private parts).                       Wash face,  Genitals (private parts) with your normal soap.             6.  Wash thoroughly, paying special attention to the area where your surgery  will be performed.  7.  Thoroughly rinse your body with warm water from the neck down.  8.  DO NOT shower/wash with your normal soap after using and rinsing off  the CHG Soap.                9.  Pat yourself dry with a clean towel.            10.  Wear clean pajamas.            11.  Place clean sheets on your bed the night of your first shower and do not  sleep with pets. Day of Surgery : Do not apply any lotions/deodorants the morning of surgery.  Please wear clean clothes to the hospital/surgery center.  FAILURE TO FOLLOW THESE INSTRUCTIONS MAY RESULT IN THE CANCELLATION OF YOUR SURGERY PATIENT SIGNATURE_________________________________  NURSE SIGNATURE__________________________________  ________________________________________________________________________  WHAT IS A BLOOD TRANSFUSION? Blood Transfusion Information  A transfusion is the replacement of blood or some of its parts. Blood is made up of multiple cells which provide different functions.  Red blood cells carry oxygen and are used for blood loss replacement.  White blood cells fight against infection.  Platelets control bleeding.  Plasma helps clot blood.  Other blood products are available for specialized needs, such as hemophilia or other clotting disorders. BEFORE THE TRANSFUSION  Who gives  blood for transfusions?   Healthy volunteers who are fully evaluated to make sure their blood is safe. This is blood bank blood. Transfusion therapy is the safest it has ever been in the practice of medicine. Before blood is taken from a donor, a complete history is taken to make sure that person has no history of diseases nor engages in risky social behavior (examples are intravenous drug use or sexual activity with multiple partners). The donor's travel history is screened to minimize risk of transmitting infections, such as malaria. The donated blood is tested for signs of infectious diseases, such as HIV and hepatitis. The blood is then tested to be sure it is compatible with you in order to minimize the chance of a transfusion reaction. If you or a relative donates blood, this is often done in anticipation of surgery and is not appropriate for emergency situations. It takes many days to process the donated blood. RISKS AND COMPLICATIONS Although transfusion therapy is very safe and saves many lives, the main dangers of transfusion include:   Getting an infectious disease.  Developing a transfusion reaction. This is an allergic reaction to something in the blood you were given. Every precaution is taken to prevent this. The decision to have a blood transfusion has been considered carefully by your caregiver before blood is given. Blood is not given unless the benefits outweigh the risks. AFTER THE TRANSFUSION  Right after receiving a blood transfusion, you will usually feel much better and more energetic. This is especially true if your red blood cells have gotten low (anemic). The transfusion raises the level of the red blood cells which carry oxygen, and this usually causes an energy increase.  The nurse administering the transfusion will monitor you carefully for complications. HOME CARE INSTRUCTIONS  No special instructions are needed after a transfusion. You may find your energy is better.  Speak with your caregiver about any limitations on activity for underlying diseases you may have. SEEK MEDICAL CARE IF:   Your condition is not improving after your transfusion.  You develop redness or irritation at the intravenous (IV) site. SEEK IMMEDIATE MEDICAL CARE IF:  Any of the following symptoms occur over the next 12 hours:  Shaking chills.  You  have a temperature by mouth above 102 F (38.9 C), not controlled by medicine.  Chest, back, or muscle pain.  People around you feel you are not acting correctly or are confused.  Shortness of breath or difficulty breathing.  Dizziness and fainting.  You get a rash or develop hives.  You have a decrease in urine output.  Your urine turns a dark color or changes to pink, red, or brown. Any of the following symptoms occur over the next 10 days:  You have a temperature by mouth above 102 F (38.9 C), not controlled by medicine.  Shortness of breath.  Weakness after normal activity.  The white part of the eye turns yellow (jaundice).  You have a decrease in the amount of urine or are urinating less often.  Your urine turns a dark color or changes to pink, red, or brown. Document Released: 07/15/2000 Document Revised: 10/10/2011 Document Reviewed: 03/03/2008 Exit[employer] Patient Information 2014 [employer].  _______________________________________________________________________  Incentive Spirometer  An incentive spirometer is a tool that can help keep your lungs clear and active. This tool measures how well you are filling your lungs with each breath. Taking long deep breaths may help reverse or decrease the chance of developing breathing (pulmonary) problems (especially infection) following:  A long period of time when you are unable to move or be active. BEFORE THE PROCEDURE   If the spirometer includes an indicator to show your best effort, your nurse or respiratory therapist will set it to a desired goal.  If  possible, sit up straight or lean slightly forward. Try not to slouch.  Hold the incentive spirometer in an upright position. INSTRUCTIONS FOR USE  1. Sit on the edge of your bed if possible, or sit up as far as you can in bed or on a chair. 2. Hold the incentive spirometer in an upright position. 3. Breathe out normally. 4. Place the mouthpiece in your mouth and seal your lips tightly around it. 5. Breathe in slowly and as deeply as possible, raising the piston or the ball toward the top of the column. 6. Hold your breath for 3-5 seconds or for as long as possible. Allow the piston or ball to fall to the bottom of the column. 7. Remove the mouthpiece from your mouth and breathe out normally. 8. Rest for a few seconds and repeat Steps 1 through 7 at least 10 times every 1-2 hours when you are awake. Take your time and take a few normal breaths between deep breaths. 9. The spirometer may include an indicator to show your best effort. Use the indicator as a goal to work toward during each repetition. 10. After each set of 10 deep breaths, practice coughing to be sure your lungs are clear. If you have an incision (the cut made at the time of surgery), support your incision when coughing by placing a pillow or rolled up towels firmly against it. Once you are able to get out of bed, walk around indoors and cough well. You may stop using the incentive spirometer when instructed by your caregiver.  RISKS AND COMPLICATIONS  Take your time so you do not get dizzy or light-headed.  If you are in pain, you may need to take or ask for pain medication before doing incentive spirometry. It is harder to take a deep breath if you are having pain. AFTER USE  Rest and breathe slowly and easily.  It can be helpful to keep track of a log of your  progress. Your caregiver can provide you with a simple table to help with this. If you are using the spirometer at home, follow these instructions: [place]  IF:   You are having difficultly using the spirometer.  You have trouble using the spirometer as often as instructed.  Your pain medication is not giving enough relief while using the spirometer.  You develop fever of 100.5 F (38.1 C) or higher. SEEK IMMEDIATE MEDICAL CARE IF:   You cough up bloody sputum that had not been present before.  You develop fever of 102 F (38.9 C) or greater.  You develop worsening pain at or near the incision site. MAKE SURE YOU:   Understand these instructions.  Will watch your condition.  Will get help right away if you are not doing well or get worse. Document Released: 11/28/2006 Document Revised: 10/10/2011 Document Reviewed: 01/29/2007 [facility] Patient Information 2014 [place], [place].   ________________________________________________________________________

## 2014-05-28 ENCOUNTER — Other Ambulatory Visit: Payer: Self-pay | Admitting: Surgical

## 2014-05-28 MED ORDER — TRANEXAMIC ACID 100 MG/ML IV SOLN: 1000.0000 mg | INTRAVENOUS | Status: AC

## 2014-05-28 NOTE — H&P (Signed)
TOTAL HIP ADMISSION H&P  Patient is admitted for right total hip arthroplasty.  Subjective:  Chief Complaint: right hip pain  HPI: Meredith Moore, 78 y.o. female, has a history of pain and functional disability in the right hip(s) due to arthritis and patient has failed non-surgical conservative treatments for greater than 12 weeks to include NSAID's and/or analgesics, corticosteriod injections and activity modification.  Onset of symptoms was gradual starting 2 years ago with gradually worsening course since that time.The patient noted no past surgery on the right hip(s).  Patient currently rates pain in the right hip at 7 out of 10 with activity. Patient has night pain, worsening of pain with activity and weight bearing, pain that interfers with activities of daily living and pain with passive range of motion. Patient has evidence of periarticular osteophytes and joint space narrowing by imaging studies. This condition presents safety issues increasing the risk of falls. There is no current active infection.  Patient Active Problem List   Diagnosis Date Noted  . Macular degeneration 09/18/2013  . Osteopenia 09/18/2013  . Other and unspecified hyperlipidemia 03/14/2013  . Postoperative anemia due to acute blood loss 09/11/2012  . OA (osteoarthritis) of knee 09/10/2012   Past Medical History  Diagnosis Date  . Arthritis   . Glaucoma   . Macular degeneration   . Osteoarthrosis, unspecified whether generalized or localized, unspecified site   . Herpes zoster with unspecified complication   . Hyperlipidemia     under control  . History of transfusion 2014    2 units, no reaction-after knee replacement  . Anemia, unspecified     hx of  . Right hip pain   . Complication of anesthesia     "Blood pressure has gone too low before and effected vision" just with general  . Complication of anesthesia     "woke up in severe panic attack" just with general  . Hip dysplasia     right side     Past Surgical History  Procedure Laterality Date  . Tonsillectomy      1945   . Ovarian tumor   1979    benign tumor removed   . Total knee arthroplasty Right 09/10/2012    Procedure: RIGHT TOTAL KNEE ARTHROPLASTY;  Surgeon: Gearlean Alf, MD;  Location: [facility];  Service: Orthopedics;  Laterality: Right;  . Belpharoptosis repair    . Robotic assisted total hysterectomy with bilateral salpingo oopherectomy Bilateral 03/25/2014    Procedure: ROBOTIC ASSISTED TOTAL HYSTERECTOMY WITH BILATERAL SALPINGO OOPHORECTOMY;  Surgeon: Everitt , MD;  Location: [facility];  Service: Gynecology;  Laterality: Bilateral;  . Appendectomy  1979     Current outpatient prescriptions: acetaminophen (TYLENOL) 500 MG tablet, Take 1,000 mg by mouth every 8 (eight) hours as needed for mild pain or headache., Disp: , Rfl: ;   atorvastatin (LIPITOR) 10 MG tablet, Take 5 mg by mouth at bedtime. , Disp: , Rfl: ;   doxylamine, Sleep, (UNISOM) 25 MG tablet, Take 25 mg by mouth at bedtime as needed for sleep., Disp: , Rfl:  HYDROcodone-acetaminophen (NORCO/VICODIN) 5-325 MG per tablet, Take 0.5-1 tablets by mouth every 6 (six) hours as needed for moderate pain., Disp: , Rfl: ;   latanoprost (XALATAN) 0.005 % ophthalmic solution, Place 1 drop into both eyes at bedtime., Disp: , Rfl: ;   Lutein 6 MG CAPS, Take 6 mg by mouth 2 (two) times daily., Disp: , Rfl: ;   Multiple Vitamins-Minerals (ICAPS) CAPS, Take 1 each by  mouth 2 (two) times daily. , Disp: , Rfl:  Omega-3 Fatty Acids (FISH OIL) 1200 MG CAPS, Take 1,200 mg by mouth 2 (two) times daily. , Disp: , Rfl: ;   Sennosides (EX-LAX PO), Take 1 tablet by mouth daily as needed (Constipation). , Disp: , Rfl: ;   Tdap (BOOSTRIX) 5-2.5-18.5 LF-MCG/0.5 injection, Inject 0.5 mLs into the muscle once., Disp: 0.5 mL, Rfl: 0;   timolol (TIMOPTIC) 0.5 % ophthalmic solution, Place 1 drop into both eyes 2 (two) times daily., Disp: , Rfl:   Allergies  Allergen Reactions  . Tramadol      Fine when taking it- did not sleep for 6 weeks after stopping medication  . Formaldehyde Rash  . Nickel Rash    History  Substance Use Topics  . Smoking status: Former Smoker    Quit date: 08/01/1988  . Smokeless tobacco: Never Used  . Alcohol Use: No    Family History Kidney disease. grandmother mothers side Osteoarthritis. First Degree Relatives. mother Heart Disease. father and grandfather fathers side Cancer. mother, brother and grandmother fathers side Cerebrovascular Accident. mother and grandmother mothers side  Review of Systems  Constitutional: Negative.   HENT: Negative.   Eyes: Negative.   Respiratory: Negative.   Cardiovascular: Negative.   Gastrointestinal: Negative.   Genitourinary: Negative.   Musculoskeletal: Positive for joint pain. Negative for back pain, falls, myalgias and neck pain.       Right hip pain  Skin: Negative.   Neurological: Negative.   Endo/Heme/Allergies: Negative.   Psychiatric/Behavioral: Negative.     Objective:  Physical Exam  Constitutional: She is oriented to person, place, and time. She appears well-developed and well-nourished. No distress.  HENT:  Head: Normocephalic and atraumatic.  Right Ear: External ear normal.  Left Ear: External ear normal.  Nose: Nose normal.  Mouth/Throat: Oropharynx is clear and moist.  Eyes: Conjunctivae and EOM are normal.  Neck: Normal range of motion. Neck supple.  Cardiovascular: Normal rate, regular rhythm, normal heart sounds and intact distal pulses.   No murmur heard. Respiratory: Breath sounds normal. No respiratory distress. She has no wheezes.  GI: Soft. Bowel sounds are normal. She exhibits no distension. There is no tenderness.  Musculoskeletal:       Right hip: She exhibits decreased range of motion and decreased strength.       Left hip: Normal.       Right knee: Normal.       Left knee: Normal.       Right lower leg: She exhibits no tenderness and no swelling.        Left lower leg: She exhibits no tenderness and no swelling.  The left hip has a normal range of motion with no discomfort. The right hip can be flexed to about 100. No internal rotation, about 20 degrees of external rotation and 20 degrees of abduction.  Neurological: She is alert and oriented to person, place, and time. She has normal strength and normal reflexes. No sensory deficit.  Skin: No rash noted. She is not diaphoretic. No erythema.  Psychiatric: She has a normal mood and affect. Her behavior is normal.   Vitals Weight: 140 lb Height: 63.5in Body Surface Area: 1.69 m Body Mass Index: 24.41 kg/m  Pulse: 68 (Regular)  BP: 122/74 (Sitting, Left Arm, Standard)  Imaging Review Plain radiographs demonstrate severe degenerative joint disease of the right hip(s). The bone quality appears to be good for age and reported activity level.  Assessment/Plan:  End stage  arthritis, right hip(s)  The patient history, physical examination, clinical judgement of the provider and imaging studies are consistent with end stage degenerative joint disease of the right hip(s) and total hip arthroplasty is deemed medically necessary. The treatment options including medical management, injection therapy, arthroscopy and arthroplasty were discussed at length. The risks and benefits of total hip arthroplasty were presented and reviewed. The risks due to aseptic loosening, infection, stiffness, dislocation/subluxation,  thromboembolic complications and other imponderables were discussed.  The patient acknowledged the explanation, agreed to proceed with the plan and consent was signed. Patient is being admitted for inpatient treatment for surgery, pain control, PT, OT, prophylactic antibiotics, VTE prophylaxis, progressive ambulation and ADL's and discharge planning.The patient is planning to be discharged home with home health services   PCP: Dr. Early Osmond  TXA IV  Ardeen Jourdain, PA-C

## 2014-06-04 ENCOUNTER — Encounter (HOSPITAL_COMMUNITY): Payer: Self-pay | Admitting: *Deleted

## 2014-06-04 ENCOUNTER — Inpatient Hospital Stay (HOSPITAL_COMMUNITY): Payer: Medicare Other | Admitting: Anesthesiology

## 2014-06-04 ENCOUNTER — Inpatient Hospital Stay (HOSPITAL_COMMUNITY): Payer: Medicare Other

## 2014-06-04 ENCOUNTER — Encounter (HOSPITAL_COMMUNITY)
Admission: RE | Disposition: A | Discharge status: Home Health | Payer: Self-pay | Source: Ambulatory Visit | Attending: Orthopedic Surgery

## 2014-06-04 ENCOUNTER — Inpatient Hospital Stay (HOSPITAL_COMMUNITY)
Admission: RE | Admit: 2014-06-04 | Discharge: 2014-06-06 | DRG: 470 | Disposition: A | Discharge status: Home Health | Payer: Medicare Other | Source: Ambulatory Visit | Attending: Orthopedic Surgery | Admitting: Orthopedic Surgery

## 2014-06-04 DIAGNOSIS — M1611 Unilateral primary osteoarthritis, right hip: Secondary | ICD-10-CM | POA: Diagnosis present

## 2014-06-04 DIAGNOSIS — Z87891 Personal history of nicotine dependence: Secondary | ICD-10-CM | POA: Diagnosis not present

## 2014-06-04 DIAGNOSIS — H3530 Unspecified macular degeneration: Secondary | ICD-10-CM | POA: Diagnosis present

## 2014-06-04 DIAGNOSIS — Z6824 Body mass index (BMI) 24.0-24.9, adult: Secondary | ICD-10-CM

## 2014-06-04 DIAGNOSIS — Z96651 Presence of right artificial knee joint: Secondary | ICD-10-CM | POA: Diagnosis present

## 2014-06-04 DIAGNOSIS — Z96649 Presence of unspecified artificial hip joint: Secondary | ICD-10-CM

## 2014-06-04 DIAGNOSIS — Z01812 Encounter for preprocedural laboratory examination: Secondary | ICD-10-CM | POA: Diagnosis not present

## 2014-06-04 DIAGNOSIS — E785 Hyperlipidemia, unspecified: Secondary | ICD-10-CM | POA: Diagnosis present

## 2014-06-04 DIAGNOSIS — H409 Unspecified glaucoma: Secondary | ICD-10-CM | POA: Diagnosis present

## 2014-06-04 DIAGNOSIS — M25551 Pain in right hip: Secondary | ICD-10-CM | POA: Diagnosis present

## 2014-06-04 DIAGNOSIS — M169 Osteoarthritis of hip, unspecified: Secondary | ICD-10-CM | POA: Diagnosis present

## 2014-06-04 HISTORY — PX: TOTAL HIP ARTHROPLASTY: SHX124

## 2014-06-04 LAB — TYPE AND SCREEN
ABO/RH(D): A POS
ANTIBODY SCREEN: NEGATIVE

## 2014-06-04 SURGERY — ARTHROPLASTY, HIP, TOTAL, ANTERIOR APPROACH
Anesthesia: Spinal | Site: Hip | Laterality: Right

## 2014-06-04 MED ORDER — CEFAZOLIN SODIUM-DEXTROSE 2-3 GM-% IV SOLR
2.0000 g | INTRAVENOUS | Status: AC
  Administered 2014-06-04: 2 g via INTRAVENOUS

## 2014-06-04 MED ORDER — LACTATED RINGERS IV SOLN
INTRAVENOUS | Status: DC
  Administered 2014-06-04: 1000 mL via INTRAVENOUS
  Administered 2014-06-04: 15:00:00 via INTRAVENOUS
  Administered 2014-06-04: 1000 mL via INTRAVENOUS

## 2014-06-04 MED ORDER — MORPHINE SULFATE 2 MG/ML IJ SOLN
1.0000 mg | INTRAMUSCULAR | Status: DC | PRN
  Administered 2014-06-04: 2 mg via INTRAVENOUS
  Filled 2014-06-04: qty 1

## 2014-06-04 MED ORDER — FENTANYL CITRATE 0.05 MG/ML IJ SOLN
INTRAMUSCULAR | Status: DC | PRN
  Administered 2014-06-04: 50 ug via INTRAVENOUS

## 2014-06-04 MED ORDER — PROPOFOL INFUSION 10 MG/ML OPTIME
INTRAVENOUS | Status: DC | PRN
  Administered 2014-06-04: 50 ug/kg/min via INTRAVENOUS

## 2014-06-04 MED ORDER — PHENYLEPHRINE HCL 10 MG/ML IJ SOLN
INTRAMUSCULAR | Status: AC
  Filled 2014-06-04: qty 1

## 2014-06-04 MED ORDER — PHENOL 1.4 % MT LIQD
1.0000 | OROMUCOSAL | Status: DC | PRN
  Filled 2014-06-04: qty 177

## 2014-06-04 MED ORDER — FLEET ENEMA 7-19 GM/118ML RE ENEM: 1.0000 | ENEMA | Freq: Once | RECTAL | Status: AC | PRN

## 2014-06-04 MED ORDER — ONDANSETRON HCL 4 MG/2ML IJ SOLN: 4.0000 mg | Freq: Four times a day (QID) | INTRAMUSCULAR | Status: DC | PRN

## 2014-06-04 MED ORDER — ACETAMINOPHEN 325 MG PO TABS: 650.0000 mg | ORAL_TABLET | Freq: Four times a day (QID) | ORAL | Status: DC | PRN

## 2014-06-04 MED ORDER — ACETAMINOPHEN 650 MG RE SUPP: 650.0000 mg | Freq: Four times a day (QID) | RECTAL | Status: DC | PRN

## 2014-06-04 MED ORDER — POLYETHYLENE GLYCOL 3350 17 G PO PACK: 17.0000 g | PACK | Freq: Every day | ORAL | Status: DC | PRN

## 2014-06-04 MED ORDER — ACETAMINOPHEN 500 MG PO TABS
1000.0000 mg | ORAL_TABLET | Freq: Four times a day (QID) | ORAL | Status: AC
  Administered 2014-06-04 – 2014-06-05 (×4): 1000 mg via ORAL
  Filled 2014-06-04 (×4): qty 2

## 2014-06-04 MED ORDER — PROPOFOL 10 MG/ML IV BOLUS
INTRAVENOUS | Status: AC
  Filled 2014-06-04: qty 20

## 2014-06-04 MED ORDER — DOCUSATE SODIUM 100 MG PO CAPS
100.0000 mg | ORAL_CAPSULE | Freq: Two times a day (BID) | ORAL | Status: DC
  Administered 2014-06-04 – 2014-06-06 (×4): 100 mg via ORAL

## 2014-06-04 MED ORDER — BUPIVACAINE HCL (PF) 0.5 % IJ SOLN
INTRAMUSCULAR | Status: DC | PRN
  Administered 2014-06-04: 3 mL

## 2014-06-04 MED ORDER — DEXAMETHASONE SODIUM PHOSPHATE 10 MG/ML IJ SOLN: 10.0000 mg | Freq: Once | INTRAMUSCULAR | Status: DC

## 2014-06-04 MED ORDER — LATANOPROST 0.005 % OP SOLN
1.0000 [drp] | Freq: Every day | OPHTHALMIC | Status: DC
  Administered 2014-06-04 – 2014-06-05 (×2): 1 [drp] via OPHTHALMIC
  Filled 2014-06-04: qty 2.5

## 2014-06-04 MED ORDER — SODIUM CHLORIDE 0.9 % IJ SOLN
INTRAMUSCULAR | Status: AC
  Filled 2014-06-04: qty 50

## 2014-06-04 MED ORDER — SODIUM CHLORIDE 0.9 % IJ SOLN
INTRAMUSCULAR | Status: DC | PRN
  Administered 2014-06-04: 30 mL

## 2014-06-04 MED ORDER — ONDANSETRON HCL 4 MG/2ML IJ SOLN
INTRAMUSCULAR | Status: AC
  Filled 2014-06-04: qty 2

## 2014-06-04 MED ORDER — RIVAROXABAN 10 MG PO TABS
10.0000 mg | ORAL_TABLET | Freq: Every day | ORAL | Status: DC
  Administered 2014-06-05 – 2014-06-06 (×2): 10 mg via ORAL
  Filled 2014-06-04 (×3): qty 1

## 2014-06-04 MED ORDER — PROPOFOL 10 MG/ML IV BOLUS
INTRAVENOUS | Status: DC | PRN
  Administered 2014-06-04: 10 mg via INTRAVENOUS
  Administered 2014-06-04: 20 mg via INTRAVENOUS

## 2014-06-04 MED ORDER — CEFAZOLIN SODIUM-DEXTROSE 2-3 GM-% IV SOLR
2.0000 g | Freq: Four times a day (QID) | INTRAVENOUS | Status: AC
  Administered 2014-06-04 – 2014-06-05 (×2): 2 g via INTRAVENOUS
  Filled 2014-06-04 (×2): qty 50

## 2014-06-04 MED ORDER — HYDROMORPHONE HCL 1 MG/ML IJ SOLN: 0.2500 mg | INTRAMUSCULAR | Status: DC | PRN

## 2014-06-04 MED ORDER — DEXAMETHASONE SODIUM PHOSPHATE 10 MG/ML IJ SOLN
10.0000 mg | Freq: Once | INTRAMUSCULAR | Status: AC
  Administered 2014-06-05: 10 mg via INTRAVENOUS
  Filled 2014-06-04: qty 1

## 2014-06-04 MED ORDER — TRANEXAMIC ACID 100 MG/ML IV SOLN
1000.0000 mg | INTRAVENOUS | Status: AC
  Administered 2014-06-04: 1000 mg via INTRAVENOUS
  Filled 2014-06-04: qty 10

## 2014-06-04 MED ORDER — METOCLOPRAMIDE HCL 5 MG/ML IJ SOLN: 5.0000 mg | Freq: Three times a day (TID) | INTRAMUSCULAR | Status: DC | PRN

## 2014-06-04 MED ORDER — DEXAMETHASONE SODIUM PHOSPHATE 10 MG/ML IJ SOLN
INTRAMUSCULAR | Status: DC | PRN
  Administered 2014-06-04: 10 mg via INTRAVENOUS

## 2014-06-04 MED ORDER — ACETAMINOPHEN 10 MG/ML IV SOLN
INTRAVENOUS | Status: DC | PRN
  Administered 2014-06-04: 1000 mg via INTRAVENOUS

## 2014-06-04 MED ORDER — MIDAZOLAM HCL 5 MG/5ML IJ SOLN
INTRAMUSCULAR | Status: DC | PRN
  Administered 2014-06-04 (×2): 1 mg via INTRAVENOUS

## 2014-06-04 MED ORDER — MENTHOL 3 MG MT LOZG
1.0000 | LOZENGE | OROMUCOSAL | Status: DC | PRN
  Filled 2014-06-04: qty 9

## 2014-06-04 MED ORDER — OXYCODONE HCL 5 MG PO TABS
5.0000 mg | ORAL_TABLET | ORAL | Status: DC | PRN
  Administered 2014-06-05 (×2): 10 mg via ORAL
  Administered 2014-06-06: 5 mg via ORAL
  Filled 2014-06-04: qty 2
  Filled 2014-06-04: qty 1
  Filled 2014-06-04: qty 2

## 2014-06-04 MED ORDER — METOCLOPRAMIDE HCL 10 MG PO TABS: 5.0000 mg | ORAL_TABLET | Freq: Three times a day (TID) | ORAL | Status: DC | PRN

## 2014-06-04 MED ORDER — PHENYLEPHRINE HCL 10 MG/ML IJ SOLN
10.0000 mg | INTRAVENOUS | Status: DC | PRN
  Administered 2014-06-04: 40 ug/min via INTRAVENOUS

## 2014-06-04 MED ORDER — PROMETHAZINE HCL 25 MG/ML IJ SOLN: 6.2500 mg | INTRAMUSCULAR | Status: DC | PRN

## 2014-06-04 MED ORDER — DIPHENHYDRAMINE HCL 12.5 MG/5ML PO ELIX: 12.5000 mg | ORAL_SOLUTION | ORAL | Status: DC | PRN

## 2014-06-04 MED ORDER — BUPIVACAINE LIPOSOME 1.3 % IJ SUSP
20.0000 mL | Freq: Once | INTRAMUSCULAR | Status: DC
  Filled 2014-06-04: qty 20

## 2014-06-04 MED ORDER — SODIUM CHLORIDE 0.9 % IV SOLN
INTRAVENOUS | Status: DC
  Administered 2014-06-04: 19:00:00 via INTRAVENOUS

## 2014-06-04 MED ORDER — MIDAZOLAM HCL 2 MG/2ML IJ SOLN
INTRAMUSCULAR | Status: AC
  Filled 2014-06-04: qty 2

## 2014-06-04 MED ORDER — ONDANSETRON HCL 4 MG/2ML IJ SOLN
INTRAMUSCULAR | Status: DC | PRN
  Administered 2014-06-04: 4 mg via INTRAVENOUS

## 2014-06-04 MED ORDER — BUPIVACAINE HCL (PF) 0.25 % IJ SOLN
INTRAMUSCULAR | Status: AC
  Filled 2014-06-04: qty 30

## 2014-06-04 MED ORDER — TIMOLOL MALEATE 0.5 % OP SOLN
1.0000 [drp] | Freq: Two times a day (BID) | OPHTHALMIC | Status: DC
  Administered 2014-06-04 – 2014-06-06 (×4): 1 [drp] via OPHTHALMIC
  Filled 2014-06-04: qty 5

## 2014-06-04 MED ORDER — BUPIVACAINE HCL (PF) 0.25 % IJ SOLN
INTRAMUSCULAR | Status: DC | PRN
  Administered 2014-06-04: 20 mL

## 2014-06-04 MED ORDER — ATORVASTATIN CALCIUM 10 MG PO TABS
5.0000 mg | ORAL_TABLET | Freq: Every day | ORAL | Status: DC
  Administered 2014-06-04: 0.5 mg via ORAL
  Administered 2014-06-05: 5 mg via ORAL
  Filled 2014-06-04 (×3): qty 0.5

## 2014-06-04 MED ORDER — BISACODYL 10 MG RE SUPP: 10.0000 mg | Freq: Every day | RECTAL | Status: DC | PRN

## 2014-06-04 MED ORDER — 0.9 % SODIUM CHLORIDE (POUR BTL) OPTIME
TOPICAL | Status: DC | PRN
  Administered 2014-06-04: 1000 mL

## 2014-06-04 MED ORDER — SODIUM CHLORIDE 0.9 % IV SOLN: INTRAVENOUS | Status: DC

## 2014-06-04 MED ORDER — STERILE WATER FOR IRRIGATION IR SOLN
Status: DC | PRN
  Administered 2014-06-04: 1500 mL

## 2014-06-04 MED ORDER — BUPIVACAINE LIPOSOME 1.3 % IJ SUSP
INTRAMUSCULAR | Status: DC | PRN
  Administered 2014-06-04: 20 mL

## 2014-06-04 MED ORDER — PHENYLEPHRINE HCL 10 MG/ML IJ SOLN
INTRAMUSCULAR | Status: AC
Start: 2014-06-04 — End: 2014-06-04
  Filled 2014-06-04: qty 1

## 2014-06-04 MED ORDER — FENTANYL CITRATE 0.05 MG/ML IJ SOLN
INTRAMUSCULAR | Status: AC
  Filled 2014-06-04: qty 2

## 2014-06-04 MED ORDER — DEXAMETHASONE SODIUM PHOSPHATE 10 MG/ML IJ SOLN
INTRAMUSCULAR | Status: AC
  Filled 2014-06-04: qty 1

## 2014-06-04 MED ORDER — METHOCARBAMOL 500 MG PO TABS
500.0000 mg | ORAL_TABLET | Freq: Four times a day (QID) | ORAL | Status: DC | PRN
  Administered 2014-06-04 – 2014-06-05 (×2): 500 mg via ORAL
  Filled 2014-06-04 (×2): qty 1

## 2014-06-04 MED ORDER — METHOCARBAMOL 1000 MG/10ML IJ SOLN
500.0000 mg | Freq: Four times a day (QID) | INTRAVENOUS | Status: DC | PRN
  Filled 2014-06-04: qty 5

## 2014-06-04 MED ORDER — ACETAMINOPHEN 10 MG/ML IV SOLN
1000.0000 mg | Freq: Once | INTRAVENOUS | Status: DC
  Filled 2014-06-04: qty 100

## 2014-06-04 MED ORDER — CEFAZOLIN SODIUM-DEXTROSE 2-3 GM-% IV SOLR
INTRAVENOUS | Status: AC
  Filled 2014-06-04: qty 50

## 2014-06-04 MED ORDER — ONDANSETRON HCL 4 MG PO TABS: 4.0000 mg | ORAL_TABLET | Freq: Four times a day (QID) | ORAL | Status: DC | PRN

## 2014-06-04 SURGICAL SUPPLY — 40 items
BAG SPEC THK2 15X12 ZIP CLS (MISCELLANEOUS)
BAG ZIPLOCK 12X15 (MISCELLANEOUS) IMPLANT
BLADE EXTENDED COATED 6.5IN (ELECTRODE) ×3 IMPLANT
BLADE SAG 18X100X1.27 (BLADE) ×3 IMPLANT
CAPT HIP PF COP ×3 IMPLANT
CLOSURE WOUND 1/2 X4 (GAUZE/BANDAGES/DRESSINGS) ×2
COVER PERINEAL POST (MISCELLANEOUS) ×3 IMPLANT
DECANTER SPIKE VIAL GLASS SM (MISCELLANEOUS) ×3 IMPLANT
DRAPE C-ARM 42X120 X-RAY (DRAPES) ×3 IMPLANT
DRAPE STERI IOBAN 125X83 (DRAPES) ×3 IMPLANT
DRAPE U-SHAPE 47X51 STRL (DRAPES) ×9 IMPLANT
DRSG ADAPTIC 3X8 NADH LF (GAUZE/BANDAGES/DRESSINGS) ×3 IMPLANT
DRSG MEPILEX BORDER 4X4 (GAUZE/BANDAGES/DRESSINGS) ×3 IMPLANT
DRSG MEPILEX BORDER 4X8 (GAUZE/BANDAGES/DRESSINGS) ×3 IMPLANT
DURAPREP 26ML APPLICATOR (WOUND CARE) ×3 IMPLANT
ELECT REM PT RETURN 9FT ADLT (ELECTROSURGICAL) ×3
ELECTRODE REM PT RTRN 9FT ADLT (ELECTROSURGICAL) ×1 IMPLANT
EVACUATOR 1/8 PVC DRAIN (DRAIN) ×3 IMPLANT
FACESHIELD WRAPAROUND (MASK) ×12 IMPLANT
GLOVE BIO SURGEON STRL SZ7.5 (GLOVE) ×3 IMPLANT
GLOVE BIO SURGEON STRL SZ8 (GLOVE) ×6 IMPLANT
GLOVE BIOGEL PI IND STRL 8 (GLOVE) ×2 IMPLANT
GLOVE BIOGEL PI INDICATOR 8 (GLOVE) ×4
GOWN STRL REUS W/TWL LRG LVL3 (GOWN DISPOSABLE) ×3 IMPLANT
GOWN STRL REUS W/TWL XL LVL3 (GOWN DISPOSABLE) ×3 IMPLANT
KIT BASIN OR (CUSTOM PROCEDURE TRAY) ×3 IMPLANT
LINER MARATHON 28 48 (Hips) IMPLANT
NDL SAFETY ECLIPSE 18X1.5 (NEEDLE) ×2 IMPLANT
NEEDLE HYPO 18GX1.5 SHARP (NEEDLE) ×6
PACK TOTAL JOINT (CUSTOM PROCEDURE TRAY) ×3 IMPLANT
STRIP CLOSURE SKIN 1/2X4 (GAUZE/BANDAGES/DRESSINGS) ×4 IMPLANT
SUT ETHIBOND NAB CT1 #1 30IN (SUTURE) ×3 IMPLANT
SUT MNCRL AB 4-0 PS2 18 (SUTURE) ×3 IMPLANT
SUT VIC AB 2-0 CT1 27 (SUTURE) ×4
SUT VIC AB 2-0 CT1 TAPERPNT 27 (SUTURE) ×2 IMPLANT
SUT VLOC 180 0 24IN GS25 (SUTURE) ×3 IMPLANT
SYR 20CC LL (SYRINGE) ×3 IMPLANT
SYR 50ML LL SCALE MARK (SYRINGE) ×3 IMPLANT
TOWEL OR 17X26 10 PK STRL BLUE (TOWEL DISPOSABLE) ×3 IMPLANT
TRAY FOLEY CATH 14FRSI W/METER (CATHETERS) ×3 IMPLANT

## 2014-06-04 NOTE — Anesthesia Postprocedure Evaluation (Signed)
  Anesthesia Post-op Note  Patient: Meredith Moore  Procedure(s) Performed: Procedure(s) (LRB): RIGHT TOTAL HIP ARTHROPLASTY ANTERIOR APPROACH (Right)  Patient Location: PACU  Anesthesia Type: Spinal  Level of Consciousness: awake and alert   Airway and Oxygen Therapy: Patient Spontanous Breathing  Post-op Pain: mild  Post-op Assessment: Post-op Vital signs reviewed, Patient's Cardiovascular Status Stable, Respiratory Function Stable, Patent Airway and No signs of Nausea or vomiting  Last Vitals:  Filed Vitals:   06/04/14 1645  BP: 118/76  Pulse: 72  Temp: 36.4 C  Resp: 14    Post-op Vital Signs: stable   Complications: No apparent anesthesia complications

## 2014-06-04 NOTE — Plan of Care (Signed)
Problem: Consults Goal: Total Joint Replacement Patient Education See Patient Education Module for education specifics. Outcome: Progressing Goal: Diagnosis- Total Joint Replacement Primary Total Hip Goal: Skin Care Protocol Initiated - if Braden Score 18 or less If consults are not indicated, leave blank or document N/A Outcome: Not Applicable Date Met:  40/37/09 Goal: Nutrition Consult-if indicated Outcome: Not Applicable Date Met:  64/38/38 Goal: Diabetes Guidelines if Diabetic/Glucose > 140 If diabetic or lab glucose is > 140 mg/dl - Initiate Diabetes/Hyperglycemia Guidelines & Document Interventions  Outcome: Not Applicable Date Met:  18/40/37  Problem: Phase I Progression Outcomes Goal: CMS/Neurovascular status WDL Outcome: Progressing Goal: Pain controlled with appropriate interventions Outcome: Progressing

## 2014-06-04 NOTE — Transfer of Care (Signed)
Immediate Anesthesia Transfer of Care Note  Patient: Meredith Moore  Procedure(s) Performed: Procedure(s): RIGHT TOTAL HIP ARTHROPLASTY ANTERIOR APPROACH (Right)  Patient Location: PACU  Anesthesia Type:Spinal  Level of Consciousness: awake, alert  and oriented  Airway & Oxygen Therapy: Patient Spontanous Breathing and Patient connected to face mask oxygen  Post-op Assessment: Report given to PACU RN and Post -op Vital signs reviewed and stable  Post vital signs: Reviewed and stable  Complications: No apparent anesthesia complications

## 2014-06-04 NOTE — Anesthesia Preprocedure Evaluation (Addendum)
Anesthesia Evaluation  Patient identified by MRN, date of birth, ID band Patient awake    Reviewed: Allergy & Precautions, H&P , NPO status , Patient's Chart, lab work & pertinent test results  History of Anesthesia Complications (+) history of anesthetic complications  Airway Mallampati: II  TM Distance: >3 FB Neck ROM: Full    Dental no notable dental hx.    Pulmonary former smoker,  breath sounds clear to auscultation  Pulmonary exam normal       Cardiovascular negative cardio ROS  Rhythm:Regular Rate:Normal     Neuro/Psych negative neurological ROS  negative psych ROS   GI/Hepatic negative GI ROS, Neg liver ROS,   Endo/Other  negative endocrine ROS  Renal/GU negative Renal ROS  negative genitourinary   Musculoskeletal  (+) Arthritis -,   Abdominal   Peds negative pediatric ROS (+)  Hematology  (+) anemia ,   Anesthesia Other Findings   Reproductive/Obstetrics negative OB ROS                            Anesthesia Physical Anesthesia Plan  ASA: II  Anesthesia Plan: Spinal   Post-op Pain Management:    Induction: Intravenous  Airway Management Planned:   Additional Equipment:   Intra-op Plan:   Post-operative Plan: Extubation in OR  Informed Consent: I have reviewed the patients History and Physical, chart, labs and discussed the procedure including the risks, benefits and alternatives for the proposed anesthesia with the patient or authorized representative who has indicated his/her understanding and acceptance.   Dental advisory given  Plan Discussed with: CRNA  Anesthesia Plan Comments: (Discussed spinal and general. Discussed risks/benefits of spinal including headache, backache, failure, bleeding, infection, and nerve damage. Patient consents to spinal. Questions answered. Coagulation studies and platelet count acceptable.)       Anesthesia Quick  Evaluation

## 2014-06-04 NOTE — Op Note (Signed)
OPERATIVE REPORT  PREOPERATIVE DIAGNOSIS: Osteoarthritis of the Right hip.   POSTOPERATIVE DIAGNOSIS: Osteoarthritis of the Right  hip.   PROCEDURE: Right total hip arthroplasty, anterior approach.   SURGEON: Gaynelle Arabian, MD   ASSISTANT: Arlee Muslim, PA-C  ANESTHESIA:  Spinal  ESTIMATED BLOOD LOSS:- 200 ml  DRAINS: Hemovac x1.   COMPLICATIONS: None   CONDITION: PACU - hemodynamically stable.   BRIEF CLINICAL NOTE: Meredith Moore is a 78 y.o. female who has advanced end-  stage arthritis of her Right  hip with progressively worsening pain and  dysfunction.The patient has failed nonoperative management and presents for  total hip arthroplasty.   PROCEDURE IN DETAIL: After successful administration of spinal  anesthetic, the traction boots for the [facility] bed were placed on both  feet and the patient was placed onto the [facility] bed, boots placed into the leg  holders. The Right hip was then isolated from the perineum with plastic  drapes and prepped and draped in the usual sterile fashion. ASIS and  greater trochanter were marked and a oblique incision was made, starting  at about 1 cm lateral and 2 cm distal to the ASIS and coursing towards  the anterior cortex of the femur. The skin was cut with a 10 blade  through subcutaneous tissue to the level of the fascia overlying the  tensor fascia lata muscle. The fascia was then incised in line with the  incision at the junction of the anterior third and posterior 2/3rd. The  muscle was teased off the fascia and then the interval between the TFL  and the rectus was developed. The Hohmann retractor was then placed at  the top of the femoral neck over the capsule. The vessels overlying the  capsule were cauterized and the fat on top of the capsule was removed.  A Hohmann retractor was then placed anterior underneath the rectus  femoris to give exposure to the entire anterior capsule. A T-shaped  capsulotomy was  performed. The edges were tagged and the femoral head  was identified.       Osteophytes are removed off the superior acetabulum.  The femoral neck was then cut in situ with an oscillating saw. Traction  was then applied to the left lower extremity utilizing the [facility]  traction. The femoral head was then removed. Retractors were placed  around the acetabulum and then circumferential removal of the labrum was  performed. Osteophytes were also removed. Reaming starts at 45 mm to  medialize and  Increased in 2 mm increments to 47 mm. We reamed in  approximately 40 degrees of abduction, 20 degrees anteversion. A 48 mm  pinnacle acetabular shell was then impacted in anatomic position under  fluoroscopic guidance with excellent purchase. We did not need to place  any additional dome screws. A 28 mm neutral + 4 marathon liner was then  placed into the acetabular shell.       The femoral lift was then placed along the lateral aspect of the femur  just distal to the vastus ridge. The leg was  externally rotated and capsule  was stripped off the inferior aspect of the femoral neck down to the  level of the lesser trochanter, this was done with electrocautery. The femur was lifted after this was performed. The  leg was then placed and extended in adducted position to essentially delivering the femur. We also removed the capsule superiorly and the  piriformis from the piriformis  fossa to gain excellent exposure of the  proximal femur. Rongeur was used to remove some cancellous bone to get  into the lateral portion of the proximal femur for placement of the  initial starter reamer. The starter broaches was placed  the starter broach  and was shown to go down the center of the canal. Broaching  with the  Corail system was then performed starting at size 8, coursing  Up to size 11. A size 11 had excellent torsional and rotational  and axial stability. The trial high offset neck was then placed  with a 28 +  1.5 trial head. The hip was then reduced. We confirmed that  the stem was in the canal both on AP and lateral x-rays. It also has excellent sizing. The hip was reduced with outstanding stability through full extension, full external rotation,  and then flexion in adduction internal rotation. AP pelvis was taken  and the leg lengths were measured and found to be exactly equal. Hip  was then dislocated again and the femoral head and neck removed. The  femoral broach was removed. Size 11 Corail stem with a high offset  neck was then impacted into the femur following native anteversion. Has  excellent purchase in the canal. Excellent torsional and rotational and  axial stability. It is confirmed to be in the canal on AP and lateral  fluoroscopic views. The 28 + 1.5 ceramic head was placed and the hip  reduced with outstanding stability. Again AP pelvis was taken and it  confirmed that the leg lengths were equal. The wound was then copiously  irrigated with saline solution and the capsule reattached and repaired  with Ethibond suture.  20 mL of Exparel mixed with 50 mL of saline then additional 20 ml of .25% Bupivicaine injected into the capsule and into the edge of the tensor fascia lata as well as subcutaneous tissue. The fascia overlying the tensor fascia lata was  then closed with a running #1 V-Loc. Subcu was closed with interrupted  2-0 Vicryl and subcuticular running 4-0 Monocryl. Incision was cleaned  and dried. Steri-Strips and a bulky sterile dressing applied. Hemovac  drain was hooked to suction and then he was awakened and transported to  recovery in stable condition.        Please note that a surgical assistant was a medical necessity for this procedure to perform it in a safe and expeditious manner. Assistant was necessary to provide appropriate retraction of vital neurovascular structures and to prevent femoral fracture and allow for anatomic placement of the prosthesis.  Gaynelle Arabian, M.D.

## 2014-06-04 NOTE — Anesthesia Procedure Notes (Signed)
Spinal Patient location during procedure: OR Start time: 06/04/2014 1:35 PM End time: 06/04/2014 1:41 PM Staffing Resident/CRNA: Anne Fu Performed by: resident/CRNA  Preanesthetic Checklist Completed: patient identified, site marked, surgical consent, pre-op evaluation, timeout performed, IV checked, risks and benefits discussed and monitors and equipment checked Spinal Block Patient position: sitting Prep: Betadine Patient monitoring: heart rate, continuous pulse ox and blood pressure Approach: right paramedian Location: L3-4 Injection technique: single-shot Needle Needle type: Spinocan  Needle gauge: 22 G Needle length: 9 cm Assessment Sensory level: T6 Additional Notes Expiration date of kit checked and confirmed. Patient tolerated procedure well, without complications. X 1 attempt with noted loss of motor and sensory on exam.

## 2014-06-04 NOTE — Interval H&P Note (Signed)
History and Physical Interval Note:  06/04/2014 12:43 PM  Arnetha Gula  has presented today for surgery, with the diagnosis of OA RIGHT HIP  The various methods of treatment have been discussed with the patient and family. After consideration of risks, benefits and other options for treatment, the patient has consented to  Procedure(s): RIGHT TOTAL HIP ARTHROPLASTY ANTERIOR APPROACH (Right) as a surgical intervention .  The patient's history has been reviewed, patient examined, no change in status, stable for surgery.  I have reviewed the patient's chart and labs.  Questions were answered to the patient's satisfaction.     Gearlean Alf

## 2014-06-05 ENCOUNTER — Encounter (HOSPITAL_COMMUNITY): Payer: Self-pay | Admitting: Orthopedic Surgery

## 2014-06-05 LAB — BASIC METABOLIC PANEL
Anion gap: 11 (ref 5–15)
BUN: 14 mg/dL (ref 6–23)
CO2: 21 meq/L (ref 19–32)
CREATININE: 0.89 mg/dL (ref 0.50–1.10)
Calcium: 8.4 mg/dL (ref 8.4–10.5)
Chloride: 102 mEq/L (ref 96–112)
GFR calc Af Amer: 72 mL/min — ABNORMAL LOW (ref >90)
GFR, EST NON AFRICAN AMERICAN: 62 mL/min — AB (ref >90)
GLUCOSE: 141 mg/dL — AB (ref 70–99)
Potassium: 4.2 mEq/L (ref 3.7–5.3)
Sodium: 134 mEq/L — ABNORMAL LOW (ref 137–147)

## 2014-06-05 LAB — CBC
HEMATOCRIT: 26.7 % — AB (ref 36.0–46.0)
HEMOGLOBIN: 8.7 g/dL — AB (ref 12.0–15.0)
MCH: 29.2 pg (ref 26.0–34.0)
MCHC: 32.6 g/dL (ref 30.0–36.0)
MCV: 89.6 fL (ref 78.0–100.0)
Platelets: 226 10*3/uL (ref 150–400)
RBC: 2.98 MIL/uL — AB (ref 3.87–5.11)
RDW: 12.7 % (ref 11.5–15.5)
WBC: 7.8 10*3/uL (ref 4.0–10.5)

## 2014-06-05 MED ORDER — SODIUM CHLORIDE 0.9 % IV BOLUS (SEPSIS)
250.0000 mL | Freq: Once | INTRAVENOUS | Status: AC
  Administered 2014-06-05: 250 mL via INTRAVENOUS

## 2014-06-05 MED ORDER — POLYSACCHARIDE IRON COMPLEX 150 MG PO CAPS
150.0000 mg | ORAL_CAPSULE | Freq: Two times a day (BID) | ORAL | Status: DC
  Administered 2014-06-05 – 2014-06-06 (×3): 150 mg via ORAL
  Filled 2014-06-05 (×4): qty 1

## 2014-06-05 NOTE — Progress Notes (Signed)
OT Cancellation Note  Patient Details Name: Meredith Moore MRN: [id number] DOB: October 31, 1935   Cancelled Treatment:      Spoke with pt regarding ADL activity - no OT needs identified Thanks,  Betsy Pries 06/05/2014, 1:25 PM

## 2014-06-05 NOTE — Progress Notes (Addendum)
Physical Therapy Treatment Note    06/05/14 1500  PT Visit Information  Last PT Received On 06/05/14  Assistance Needed +1  History of Present Illness Pt is a 78 year old female s/p R direct anterior THA with hx of R TKA, glaucoma with decreased vision  PT Time Calculation  PT Start Time 1406  PT Stop Time 1421  PT Time Calculation (min) 15 min  Subjective Data  Subjective Pt ambulated in hallway this afternoon and practiced steps with rail and cane.  Pt plans to d/c home tomorrow.  Precautions  Precautions Fall;None  Precaution Comments direct anterior approach  Restrictions  Other Position/Activity Restrictions WBAT  Pain Assessment  Pain Assessment 0-10  Pain Score 3  Pain Location R hip  Pain Descriptors / Indicators Aching;Sore  Cognition  Arousal/Alertness Awake/alert  Behavior During Therapy WFL for tasks assessed/performed  Overall Cognitive Status Within Functional Limits for tasks assessed  Bed Mobility  Overal bed mobility Needs Assistance  Bed Mobility Supine to Sit;Sit to Supine  Supine to sit Supervision  Sit to supine Supervision  General bed mobility comments pt able to self assist R LE  Transfers  Overall transfer level Needs assistance  Equipment used Rolling walker (2 wheeled)  Transfers Sit to/from Stand  Sit to Stand Supervision  General transfer comment verbal cues for hand placement  Ambulation/Gait  Ambulation/Gait assistance Supervision  Ambulation Distance (Feet) 400 Feet  Assistive device Rolling walker (2 wheeled)  Gait Pattern/deviations Step-through pattern;Antalgic  General Gait Details verbal cues for RW positioning, step length, and posture  Stairs Yes  Stairs assistance Min guard  Stair Management Step to pattern;Forwards;With cane;One rail Right  Number of Stairs 4  General stair comments verbal cues for sequence, safety, pt reports easier to perform stairs postop then prior to surgery now  PT - End of Session  Activity Tolerance  Patient tolerated treatment well  Patient left in bed;with call bell/phone within reach;with family/visitor present  PT - Assessment/Plan  PT Plan Current plan remains appropriate  PT Frequency 7X/week  Follow Up Recommendations Home health PT  PT equipment None recommended by PT  PT Goal Progression  Progress towards PT goals Progressing toward goals  PT General Charges  $$ ACUTE PT VISIT 1 Procedure  PT Treatments  $Gait Training 8-22 mins   Carmelia Bake, PT, DPT 06/05/2014 Pager: [phone]

## 2014-06-05 NOTE — Plan of Care (Signed)
Problem: Phase I Progression Outcomes Goal: CMS/Neurovascular status WDL Outcome: Completed/Met Date Met:  06/05/14 Goal: Pain controlled with appropriate interventions Outcome: Completed/Met Date Met:  06/05/14 Goal: Dangle or out of bed evening of surgery Outcome: Completed/Met Date Met:  06/05/14 Goal: Hemodynamically stable Outcome: Progressing  Problem: Phase II Progression Outcomes Goal: Tolerating diet Outcome: Completed/Met Date Met:  06/05/14

## 2014-06-05 NOTE — Care Management Note (Signed)
    Page 1 of 2   06/05/2014     1:49:11 PM CARE MANAGEMENT NOTE 06/05/2014  Patient:  Meredith Moore, Meredith Moore   Account Number:  [id number]  Date Initiated:  06/05/2014  Documentation initiated by:  [facility]  Subjective/Objective Assessment:   adm: RIGHT TOTAL HIP ARTHROPLASTY ANTERIOR APPROACH (Right)     Action/Plan:   discharge planning   Anticipated DC Date:  06/06/2014   Anticipated DC Plan:  [place]  CM consult      [facility] Choice  HOME HEALTH   Choice offered to / List presented to:  C-1 Patient   DME arranged  NA      DME agency  NA     [place] arranged  HH-2 PT      [place]   Status of service:  Completed, signed off Medicare Important Message given?   (If response is "NO", the following Medicare IM given date fields will be blank) Date Medicare IM given:   Medicare IM given by:   Date Additional Medicare IM given:   Additional Medicare IM given by:    Discharge Disposition:  [place]  Per UR Regulation:    If discussed at Long Length of Stay Meetings, dates discussed:    Comments:  06/05/14 11:00 CM met with pt in room to offer choice of home health agency.  Pt chooses [facility] to render HHPT.  Pt has DME at home.  Address and contact information verified with pt.  Referral called to [facility].  No other CM needs were communicated.  Mariane Masters, BSN, Cm [phone].

## 2014-06-05 NOTE — Plan of Care (Signed)
Problem: Phase II Progression Outcomes Goal: Discharge plan established Outcome: Progressing     

## 2014-06-05 NOTE — Progress Notes (Signed)
   Subjective: 1 Day Post-Op Procedure(s) (LRB): RIGHT TOTAL HIP ARTHROPLASTY ANTERIOR APPROACH (Right) Patient reports pain as mild.   Patient seen in rounds by Dr. Wynelle Link. Patient doing very well this morning.  Did not sleep but "rested" last night. Patient is well, but has had some minor complaints of pain in the hip, requiring pain medications We will start therapy today.  Plan is to go Home after hospital stay.  Objective: Vital signs in last 24 hours: Temp:  [97.3 F (36.3 C)-98.4 F (36.9 C)] 98.1 F (36.7 C) (11/05 0658) Pulse Rate:  [58-78] 67 (11/05 0658) Resp:  [8-16] 16 (11/05 0658) BP: (99-150)/(49-93) 102/58 mmHg (11/05 0658) SpO2:  [99 %-100 %] 100 % (11/05 0658) Weight:  [63.957 kg (141 lb)] 63.957 kg (141 lb) (11/04 1645)  Intake/Output from previous day:  Intake/Output Summary (Last 24 hours) at 06/05/14 0722 Last data filed at 06/05/14 4081  Gross per 24 hour  Intake   4110 ml  Output   2205 ml  Net   1905 ml   Labs:  Recent Labs  06/05/14 0455  HGB 8.7*    Recent Labs  06/05/14 0455  WBC 7.8  RBC 2.98*  HCT 26.7*  PLT 226    Recent Labs  06/05/14 0455  NA 134*  K 4.2  CL 102  CO2 21  BUN 14  CREATININE 0.89  GLUCOSE 141*  CALCIUM 8.4   No results for input(s): LABPT, INR in the last 72 hours.  EXAM General - Patient is Alert, Appropriate and Oriented Extremity - Neurovascular intact Sensation intact distally Dressing - dressing C/D/I Motor Function - intact, moving foot and toes well on exam.  Hemovac pulled without difficulty.  Past Medical History  Diagnosis Date  . Arthritis   . Glaucoma   . Macular degeneration   . Osteoarthrosis, unspecified whether generalized or localized, unspecified site   . Herpes zoster with unspecified complication   . Hyperlipidemia     under control  . History of transfusion 2014    2 units, no reaction-after knee replacement  . Anemia, unspecified     hx of  . Right hip pain   .  Complication of anesthesia     "Blood pressure has gone too low before and effected vision" just with general  . Complication of anesthesia     "woke up in severe panic attack" just with general  . Hip dysplasia     right side    Assessment/Plan: 1 Day Post-Op Procedure(s) (LRB): RIGHT TOTAL HIP ARTHROPLASTY ANTERIOR APPROACH (Right) Principal Problem:   OA (osteoarthritis) of hip  Estimated body mass index is 24.98 kg/(m^2) as calculated from the following:   Height as of this encounter: 5\' 3"  (1.6 m).   Weight as of this encounter: 63.957 kg (141 lb). Advance diet Up with therapy Plan for discharge tomorrow Discharge home with home health  DVT Prophylaxis - Xarelto Weight Bearing As Tolerated right Leg Hemovac Pulled Begin Therapy   Arlee Muslim, PA-C Orthopaedic Surgery 06/05/2014, 7:22 AM

## 2014-06-05 NOTE — Discharge Instructions (Addendum)
Dr. Gaynelle Arabian Total Joint Specialist [facility] [address] [place], [place] [address] [phone]    ANTERIOR APPROACH TOTAL HIP REPLACEMENT POSTOPERATIVE DIRECTIONS   Hip Rehabilitation, Guidelines Following Surgery  The results of a hip operation are greatly improved after range of motion and muscle strengthening exercises. Follow all safety measures which are given to protect your hip. If any of these exercises cause increased pain or swelling in your joint, decrease the amount until you are comfortable again. Then slowly increase the exercises. Call your caregiver if you have problems or questions.  HOME CARE INSTRUCTIONS  Most of the following instructions are designed to prevent the dislocation of your new hip.  Remove items at home which could result in a fall. This includes throw rugs or furniture in walking pathways.  Continue medications as instructed at time of discharge.  You may have some home medications which will be placed on hold until you complete the course of blood thinner medication.  You may start showering once you are discharged home but do not submerge the incision under water. Just pat the incision dry and apply a dry gauze dressing on daily. Do not put on socks or shoes without following the instructions of your caregivers.  Sit on high chairs which makes it easier to stand.  Sit on chairs with arms. Use the chair arms to help push yourself up when arising.  Keep your leg on the side of the operation out in front of you when standing up.  Arrange for the use of a toilet seat elevator so you are not sitting low.    Walk with walker as instructed.  You may resume a sexual relationship in one month or when given the OK by your caregiver.  Use walker as long as suggested by your caregivers.  You may put full weight on your legs and walk as much as is comfortable. Avoid periods of inactivity such as sitting longer than an hour  when not asleep. This helps prevent blood clots.  You may return to work once you are cleared by Engineer, production.  Do not drive a car for 6 weeks or until released by your surgeon.  Do not drive while taking narcotics.  Wear elastic stockings for three weeks following surgery during the day but you may remove then at night.  Make sure you keep all of your appointments after your operation with all of your doctors and caregivers. You should call the office at the above phone number and make an appointment for approximately two weeks after the date of your surgery. Change the dressing daily and reapply a dry dressing each time. Please pick up a stool softener and laxative for home use as long as you are requiring pain medications.  Continue to use ice on the hip for pain and swelling from surgery. You may notice swelling that will progress down to the foot and ankle.  This is normal after surgery.  Elevate the leg when you are not up walking on it.   It is important for you to complete the blood thinner medication as prescribed by your doctor.  Continue to use the breathing machine which will help keep your temperature down.  It is common for your temperature to cycle up and down following surgery, especially at night when you are not up moving around and exerting yourself.  The breathing machine keeps your lungs expanded and your temperature down.  RANGE OF MOTION AND STRENGTHENING EXERCISES  These exercises are designed to help you keep full movement of your hip joint. Follow your caregiver's or physical therapist's instructions. Perform all exercises about fifteen times, three times per day or as directed. Exercise both hips, even if you have had only one joint replacement. These exercises can be done on a training (exercise) mat, on the floor, on a table or on a bed. Use whatever works the best and is most comfortable for you. Use music or television while you are exercising so that the exercises are a  pleasant break in your day. This will make your life better with the exercises acting as a break in routine you can look forward to.  Lying on your back, slowly slide your foot toward your buttocks, raising your knee up off the floor. Then slowly slide your foot back down until your leg is straight again.  Lying on your back spread your legs as far apart as you can without causing discomfort.  Lying on your side, raise your upper leg and foot straight up from the floor as far as is comfortable. Slowly lower the leg and repeat.  Lying on your back, tighten up the muscle in the front of your thigh (quadriceps muscles). You can do this by keeping your leg straight and trying to raise your heel off the floor. This helps strengthen the largest muscle supporting your knee.  Lying on your back, tighten up the muscles of your buttocks both with the legs straight and with the knee bent at a comfortable angle while keeping your heel on the floor.   SKILLED REHAB INSTRUCTIONS: If the patient is transferred to a skilled rehab facility following release from the hospital, a list of the current medications will be sent to the facility for the patient to continue.  When discharged from the skilled rehab facility, please have the facility set up the patient's [place] prior to being released. Also, the skilled facility will be responsible for providing the patient with their medications at time of release from the facility to include their pain medication, the muscle relaxants, and their blood thinner medication. If the patient is still at the rehab facility at time of the two week follow up appointment, the skilled rehab facility will also need to assist the patient in arranging follow up appointment in our office and any transportation needs.  MAKE SURE YOU:  Understand these instructions.  Will watch your condition.  Will get help right away if you are not doing well or get worse.  Pick up  stool softner and laxative for home. Do not submerge incision under water. May shower. Continue to use ice for pain and swelling from surgery. Total Hip Protocol.  Take Xarelto for two and a half more weeks, then discontinue Xarelto. Once the patient has completed the blood thinner regimen, then take a Baby 81 mg Aspirin daily for three more weeks.   Postoperative Constipation Protocol  Constipation - defined medically as fewer than three stools per week and severe constipation as less than one stool per week.  One of the most common issues patients have following surgery is constipation.  Even if you have a regular bowel pattern at home, your normal regimen is likely to be disrupted due to multiple reasons following surgery.  Combination of anesthesia, postoperative narcotics, change in appetite and fluid intake all can affect your bowels.  In order to avoid complications following surgery, here are some recommendations in order to help you during your  recovery period.  Colace (docusate) - Pick up an over-the-counter form of Colace or another stool softener and take twice a day as long as you are requiring postoperative pain medications.  Take with a full glass of water daily.  If you experience loose stools or diarrhea, hold the colace until you stool forms back up.  If your symptoms do not get better within 1 week or if they get worse, check with your doctor.  Dulcolax (bisacodyl) - Pick up over-the-counter and take as directed by the product packaging as needed to assist with the movement of your bowels.  Take with a full glass of water.  Use this product as needed if not relieved by Colace only.   MiraLax (polyethylene glycol) - Pick up over-the-counter to have on hand.  MiraLax is a solution that will increase the amount of water in your bowels to assist with bowel movements.  Take as directed and can mix with a glass of water, juice, soda, coffee, or tea.  Take if you go more than two days  without a movement. Do not use MiraLax more than once per day. Call your doctor if you are still constipated or irregular after using this medication for 7 days in a row.  If you continue to have problems with postoperative constipation, please contact the office for further assistance and recommendations.  If you experience "the worst abdominal pain ever" or develop nausea or vomiting, please contact the office immediatly for further recommendations for treatment.   Information on my medicine - XARELTO (Rivaroxaban)  This medication education was reviewed with me or my healthcare representative as part of my discharge preparation.  The pharmacist that spoke with me during my hospital stay was:  Absher, Julieta Bellini, RPH  Why was Xarelto prescribed for you? Xarelto was prescribed for you to reduce the risk of blood clots forming after orthopedic surgery. The medical term for these abnormal blood clots is venous thromboembolism (VTE).  What do you need to know about xarelto ? Take your Xarelto ONCE DAILY at the same time every day. You may take it either with or without food.  If you have difficulty swallowing the tablet whole, you may crush it and mix in applesauce just prior to taking your dose.  Take Xarelto exactly as prescribed by your doctor and DO NOT stop taking Xarelto without talking to the doctor who prescribed the medication.  Stopping without other VTE prevention medication to take the place of Xarelto may increase your risk of developing a clot.  After discharge, you should have regular check-up appointments with your healthcare provider that is prescribing your Xarelto.    What do you do if you miss a dose? If you miss a dose, take it as soon as you remember on the same day then continue your regularly scheduled once daily regimen the next day. Do not take two doses of Xarelto on the same day.   Important Safety Information A possible side effect of Xarelto is  bleeding. You should call your healthcare provider right away if you experience any of the following: ? Bleeding from an injury or your nose that does not stop. ? Unusual colored urine (red or dark brown) or unusual colored stools (red or black). ? Unusual bruising for unknown reasons. ? A serious fall or if you hit your head (even if there is no bleeding).  Some medicines may interact with Xarelto and might increase your risk of bleeding while on Xarelto. To help avoid  this, consult your healthcare provider or pharmacist prior to using any new prescription or non-prescription medications, including herbals, vitamins, non-steroidal anti-inflammatory drugs (NSAIDs) and supplements.  This website has more information on Xarelto: [email or web address]

## 2014-06-05 NOTE — Progress Notes (Signed)
Utilization review completed.  

## 2014-06-05 NOTE — Evaluation (Signed)
Physical Therapy Evaluation Patient Details Name: Meredith Moore MRN: [id number] DOB: [birth year] Today's Date: 06/05/2014   History of Present Illness  Pt is a 78 year old female s/p R direct anterior THA with hx of R TKA, glaucoma with decreased vision  Clinical Impression  Pt is s/p R THA resulting in the deficits listed below (see PT Problem List).  Pt will benefit from skilled PT to increase their independence and safety with mobility to allow discharge to the venue listed below.  Pt pleasant and cooperative.  Pt ambulated good distance and report very little pain.  Pt also performed exercises once returned to bed.  Pt anticipates d/c home tomorrow.     Follow Up Recommendations Home health PT    Equipment Recommendations  None recommended by PT    Recommendations for Other Services       Precautions / Restrictions Precautions Precautions: Fall;None Restrictions Other Position/Activity Restrictions: WBAT      Mobility  Bed Mobility Overal bed mobility: Needs Assistance Bed Mobility: Sit to Supine       Sit to supine: Min assist   General bed mobility comments: assis for R LE onto bed  Transfers Overall transfer level: Needs assistance Equipment used: Rolling walker (2 wheeled) Transfers: Sit to/from Stand Sit to Stand: Min guard         General transfer comment: verbal cues for hand placement  Ambulation/Gait Ambulation/Gait assistance: Min guard Ambulation Distance (Feet): 400 Feet Assistive device: Rolling walker (2 wheeled) Gait Pattern/deviations: Step-through pattern;Antalgic;Trunk flexed     General Gait Details: verbal cues for RW positioning, step length, and posture  Stairs            Wheelchair Mobility    Modified Rankin (Stroke Patients Only)       Balance                                             Pertinent Vitals/Pain Pain Assessment: 0-10 Pain Score: 1  Pain Location: R hip Pain Descriptors /  Indicators: Sore;Aching Pain Intervention(s): Limited activity within patient's tolerance;Monitored during session;Repositioned;Ice applied    Home Living Family/patient expects to be discharged to:: Private residence Living Arrangements: Spouse/significant other   Type of Home: House Home Access: Stairs to enter     Home Layout: Two level Home Equipment: Environmental consultant - 2 wheels;Bedside commode;Cane - single point      Prior Function Level of Independence: Independent               Hand Dominance        Extremity/Trunk Assessment               Lower Extremity Assessment: RLE deficits/detail RLE Deficits / Details: functional hip weakness observed       Communication   Communication: No difficulties  Cognition Arousal/Alertness: Awake/alert Behavior During Therapy: WFL for tasks assessed/performed Overall Cognitive Status: Within Functional Limits for tasks assessed                      General Comments      Exercises Total Joint Exercises Ankle Circles/Pumps: AROM;Both;10 reps Quad Sets: AROM;Both;10 reps Gluteal Sets: AROM;Both;10 reps Towel Squeeze: AROM;Both;10 reps Short Arc Quad: AROM;Right;10 reps Heel Slides: AROM;Right;10 reps Hip ABduction/ADduction: AROM;Right;10 reps      Assessment/Plan    PT Assessment Patient needs continued PT services  PT Diagnosis Difficulty walking   PT Problem List Decreased strength;Decreased activity tolerance;Pain;Decreased mobility  PT Treatment Interventions Functional mobility training;Gait training;Stair training;DME instruction;Patient/family education;Therapeutic exercise;Therapeutic activities   PT Goals (Current goals can be found in the Care Plan section) Acute Rehab PT Goals PT Goal Formulation: With patient Time For Goal Achievement: 06/12/14 Potential to Achieve Goals: Good    Frequency 7X/week   Barriers to discharge        Co-evaluation               End of Session    Activity Tolerance: Patient tolerated treatment well Patient left: in bed;with call bell/phone within reach;with family/visitor present           Time: 0932-3557 PT Time Calculation (min): 26 min   Charges:   PT Evaluation $Initial PT Evaluation Tier I: 1 Procedure PT Treatments $Gait Training: 8-22 mins $Therapeutic Exercise: 8-22 mins   PT G Codes:          Yani Coventry,KATHrine E 06/05/2014, 12:57 PM Carmelia Bake, PT, DPT 06/05/2014 Pager: [phone]

## 2014-06-06 LAB — BASIC METABOLIC PANEL
ANION GAP: 12 (ref 5–15)
BUN: 14 mg/dL (ref 6–23)
CALCIUM: 8.7 mg/dL (ref 8.4–10.5)
CHLORIDE: 106 meq/L (ref 96–112)
CO2: 23 mEq/L (ref 19–32)
CREATININE: 0.78 mg/dL (ref 0.50–1.10)
GFR, EST NON AFRICAN AMERICAN: 80 mL/min — AB (ref >90)
Glucose, Bld: 121 mg/dL — ABNORMAL HIGH (ref 70–99)
Potassium: 4 mEq/L (ref 3.7–5.3)
Sodium: 141 mEq/L (ref 137–147)

## 2014-06-06 LAB — CBC
HCT: 25.3 % — ABNORMAL LOW (ref 36.0–46.0)
Hemoglobin: 8.5 g/dL — ABNORMAL LOW (ref 12.0–15.0)
MCH: 30 pg (ref 26.0–34.0)
MCHC: 33.6 g/dL (ref 30.0–36.0)
MCV: 89.4 fL (ref 78.0–100.0)
PLATELETS: 227 10*3/uL (ref 150–400)
RBC: 2.83 MIL/uL — ABNORMAL LOW (ref 3.87–5.11)
RDW: 13 % (ref 11.5–15.5)
WBC: 11.4 10*3/uL — AB (ref 4.0–10.5)

## 2014-06-06 MED ORDER — RIVAROXABAN 10 MG PO TABS: 10.0000 mg | ORAL_TABLET | Freq: Every day | ORAL | Status: DC

## 2014-06-06 MED ORDER — METHOCARBAMOL 500 MG PO TABS
500.0000 mg | ORAL_TABLET | Freq: Four times a day (QID) | ORAL | Status: DC | PRN
Start: 2014-06-06 — End: 2014-09-25

## 2014-06-06 MED ORDER — POLYSACCHARIDE IRON COMPLEX 150 MG PO CAPS: 150.0000 mg | ORAL_CAPSULE | Freq: Two times a day (BID) | ORAL | Status: DC

## 2014-06-06 MED ORDER — OXYCODONE HCL 5 MG PO TABS: 5.0000 mg | ORAL_TABLET | ORAL | Status: DC | PRN

## 2014-06-06 NOTE — Progress Notes (Signed)
   Subjective: 2 Days Post-Op Procedure(s) (LRB): RIGHT TOTAL HIP ARTHROPLASTY ANTERIOR APPROACH (Right) Patient reports pain as mild.   Patient seen in rounds with Dr. Wynelle Link. Did great with therapy yesterday. Patient is well, and has had no acute complaints or problems Patient is ready to go home  Objective: Vital signs in last 24 hours: Temp:  [97.3 F (36.3 C)-98.6 F (37 C)] 98.6 F (37 C) (11/06 0624) Pulse Rate:  [63-80] 66 (11/06 0624) Resp:  [16-18] 16 (11/06 0400) BP: (95-116)/(47-68) 116/63 mmHg (11/06 0624) SpO2:  [96 %-100 %] 98 % (11/06 0624)  Intake/Output from previous day:  Intake/Output Summary (Last 24 hours) at 06/06/14 0713 Last data filed at 06/06/14 6063  Gross per 24 hour  Intake 1648.33 ml  Output   2025 ml  Net -376.67 ml     Labs:  Recent Labs  06/05/14 0455 06/06/14 0500  HGB 8.7* 8.5*    Recent Labs  06/05/14 0455 06/06/14 0500  WBC 7.8 11.4*  RBC 2.98* 2.83*  HCT 26.7* 25.3*  PLT 226 227    Recent Labs  06/05/14 0455 06/06/14 0500  NA 134* 141  K 4.2 4.0  CL 102 106  CO2 21 23  BUN 14 14  CREATININE 0.89 0.78  GLUCOSE 141* 121*  CALCIUM 8.4 8.7   No results for input(s): LABPT, INR in the last 72 hours.  EXAM: General - Patient is Alert, Appropriate and Oriented Extremity - Neurovascular intact Sensation intact distally Dorsiflexion/Plantar flexion intact Incision - clean, dry, no drainage Motor Function - intact, moving foot and toes well on exam.   Assessment/Plan: 2 Days Post-Op Procedure(s) (LRB): RIGHT TOTAL HIP ARTHROPLASTY ANTERIOR APPROACH (Right) Procedure(s) (LRB): RIGHT TOTAL HIP ARTHROPLASTY ANTERIOR APPROACH (Right) Past Medical History  Diagnosis Date  . Arthritis   . Glaucoma   . Macular degeneration   . Osteoarthrosis, unspecified whether generalized or localized, unspecified site   . Herpes zoster with unspecified complication   . Hyperlipidemia     under control  . History of  transfusion 2014    2 units, no reaction-after knee replacement  . Anemia, unspecified     hx of  . Right hip pain   . Complication of anesthesia     "Blood pressure has gone too low before and effected vision" just with general  . Complication of anesthesia     "woke up in severe panic attack" just with general  . Hip dysplasia     right side   Principal Problem:   OA (osteoarthritis) of hip  Estimated body mass index is 24.98 kg/(m^2) as calculated from the following:   Height as of this encounter: 5\' 3"  (1.6 m).   Weight as of this encounter: 63.957 kg (141 lb). Up with therapy Discharge home with home health Diet - regular diet Follow up - in 2 weeks Activity - WBAT Disposition - Home Condition Upon Discharge - Stable D/C Meds - See DC Summary DVT Prophylaxis - Xarelto  Arlee Muslim, PA-C Orthopaedic Surgery 06/06/2014, 7:13 AM

## 2014-06-06 NOTE — Progress Notes (Signed)
Physical Therapy Treatment Patient Details Name: Meredith Moore MRN: [id number] DOB: [birth year] Today's Date: 06-11-2014    History of Present Illness Pt is a 78 year old female s/p R direct anterior THA with hx of R TKA, glaucoma with decreased vision    PT Comments    Pt progressing very well with mobility.  Pt performed LE exercises.  Pt feels ready for d/c home today.  Follow Up Recommendations  Home health PT     Equipment Recommendations  None recommended by PT    Recommendations for Other Services       Precautions / Restrictions Precautions Precautions: Fall;None Precaution Comments: direct anterior approach Restrictions Other Position/Activity Restrictions: WBAT    Mobility  Bed Mobility Overal bed mobility: Modified Independent                Transfers Overall transfer level: Needs assistance Equipment used: Rolling walker (2 wheeled) Transfers: Sit to/from Stand Sit to Stand: Supervision         General transfer comment: verbal cues for hand placement  Ambulation/Gait Ambulation/Gait assistance: Supervision Ambulation Distance (Feet): 400 Feet Assistive device: Rolling walker (2 wheeled) Gait Pattern/deviations: Step-through pattern;Antalgic     General Gait Details: verbal cues for step length and posture   Stairs Stairs: Yes Stairs assistance: Supervision Stair Management: Step to pattern;Forwards;With cane;One rail Right Number of Stairs: 4 General stair comments: pt able to recall safe technique and sequence from yesterday  Wheelchair Mobility    Modified Rankin (Stroke Patients Only)       Balance                                    Cognition Arousal/Alertness: Awake/alert Behavior During Therapy: WFL for tasks assessed/performed Overall Cognitive Status: Within Functional Limits for tasks assessed                      Exercises Total Joint Exercises Ankle Circles/Pumps: AROM;Both;10  reps Quad Sets: AROM;Both;10 reps Gluteal Sets: AROM;Both;10 reps Towel Squeeze: AROM;Both;10 reps Short Arc Quad: AROM;Right;10 reps Heel Slides: AROM;Right;10 reps Hip ABduction/ADduction: AROM;Right;10 reps Long Arc Quad: AROM;10 reps;Seated;Right Marching in Standing: Seated;AROM;15 reps;Right    General Comments        Pertinent Vitals/Pain Pain Assessment: No/denies pain    Home Living                      Prior Function            PT Goals (current goals can now be found in the care plan section) Progress towards PT goals: Progressing toward goals    Frequency  7X/week    PT Plan Current plan remains appropriate    Co-evaluation             End of Session   Activity Tolerance: Patient tolerated treatment well Patient left: with call bell/phone within reach;in chair     Time: 6734-1937 PT Time Calculation (min): 24 min  Charges:  $Gait Training: 8-22 mins $Therapeutic Exercise: 8-22 mins                    G Codes:      Emika Tiano,KATHrine E 2014-06-11, 1:41 PM Carmelia Bake, PT, DPT 06/11/14 Pager: [phone]

## 2014-06-06 NOTE — Discharge Summary (Signed)
Physician Discharge Summary   Patient ID: Meredith Moore MRN: [id number] DOB/AGE: 1935-09-26 78 y.o.  Admit date: 06/04/2014 Discharge date: 06-06-2014  Primary Diagnosis:  Osteoarthritis of the Right hip.  Admission Diagnoses:  Past Medical History  Diagnosis Date  . Arthritis   . Glaucoma   . Macular degeneration   . Osteoarthrosis, unspecified whether generalized or localized, unspecified site   . Herpes zoster with unspecified complication   . Hyperlipidemia     under control  . History of transfusion 2014    2 units, no reaction-after knee replacement  . Anemia, unspecified     hx of  . Right hip pain   . Complication of anesthesia     "Blood pressure has gone too low before and effected vision" just with general  . Complication of anesthesia     "woke up in severe panic attack" just with general  . Hip dysplasia     right side   Discharge Diagnoses:   Principal Problem:   OA (osteoarthritis) of hip  Estimated body mass index is 24.98 kg/(m^2) as calculated from the following:   Height as of this encounter: '5\' 3"'  (1.6 m).   Weight as of this encounter: 63.957 kg (141 lb).  Procedure(s) (LRB): RIGHT TOTAL HIP ARTHROPLASTY ANTERIOR APPROACH (Right)   Consults: None  HPI: Meredith Moore is a 78 y.o. female who has advanced end-  stage arthritis of her Right hip with progressively worsening pain and  dysfunction.The patient has failed nonoperative management and presents for  total hip arthroplasty.  Laboratory Data: Admission on 06/04/2014  Component Date Value Ref Range Status  . WBC 06/05/2014 7.8  4.0 - 10.5 K/uL Final  . RBC 06/05/2014 2.98* 3.87 - 5.11 MIL/uL Final  . Hemoglobin 06/05/2014 8.7* 12.0 - 15.0 g/dL Final  . HCT 06/05/2014 26.7* 36.0 - 46.0 % Final  . MCV 06/05/2014 89.6  78.0 - 100.0 fL Final  . MCH 06/05/2014 29.2  26.0 - 34.0 pg Final  . MCHC 06/05/2014 32.6  30.0 - 36.0 g/dL Final  . RDW 06/05/2014 12.7  11.5 - 15.5 % Final    . Platelets 06/05/2014 226  150 - 400 K/uL Final  . Sodium 06/05/2014 134* 137 - 147 mEq/L Final  . Potassium 06/05/2014 4.2  3.7 - 5.3 mEq/L Final  . Chloride 06/05/2014 102  96 - 112 mEq/L Final  . CO2 06/05/2014 21  19 - 32 mEq/L Final  . Glucose, Bld 06/05/2014 141* 70 - 99 mg/dL Final  . BUN 06/05/2014 14  6 - 23 mg/dL Final  . Creatinine, Ser 06/05/2014 0.89  0.50 - 1.10 mg/dL Final  . Calcium 06/05/2014 8.4  8.4 - 10.5 mg/dL Final  . GFR calc non Af Amer 06/05/2014 62* >90 mL/min Final  . GFR calc Af Amer 06/05/2014 72* >90 mL/min Final   Comment: (NOTE) The eGFR has been calculated using the CKD EPI equation. This calculation has not been validated in all clinical situations. eGFR's persistently <90 mL/min signify possible Chronic Kidney Disease.   . Anion gap 06/05/2014 11  5 - 15 Final  . WBC 06/06/2014 11.4* 4.0 - 10.5 K/uL Final  . RBC 06/06/2014 2.83* 3.87 - 5.11 MIL/uL Final  . Hemoglobin 06/06/2014 8.5* 12.0 - 15.0 g/dL Final  . HCT 06/06/2014 25.3* 36.0 - 46.0 % Final  . MCV 06/06/2014 89.4  78.0 - 100.0 fL Final  . MCH 06/06/2014 30.0  26.0 - 34.0 pg Final  . MCHC 06/06/2014  33.6  30.0 - 36.0 g/dL Final  . RDW 06/06/2014 13.0  11.5 - 15.5 % Final  . Platelets 06/06/2014 227  150 - 400 K/uL Final  . Sodium 06/06/2014 141  137 - 147 mEq/L Final   Comment: DELTA CHECK NOTED REPEATED TO VERIFY   . Potassium 06/06/2014 4.0  3.7 - 5.3 mEq/L Final  . Chloride 06/06/2014 106  96 - 112 mEq/L Final  . CO2 06/06/2014 23  19 - 32 mEq/L Final  . Glucose, Bld 06/06/2014 121* 70 - 99 mg/dL Final  . BUN 06/06/2014 14  6 - 23 mg/dL Final  . Creatinine, Ser 06/06/2014 0.78  0.50 - 1.10 mg/dL Final  . Calcium 06/06/2014 8.7  8.4 - 10.5 mg/dL Final  . GFR calc non Af Amer 06/06/2014 80* >90 mL/min Final  . GFR calc Af Amer 06/06/2014 >90  >90 mL/min Final   Comment: (NOTE) The eGFR has been calculated using the CKD EPI equation. This calculation has not been validated in all  clinical situations. eGFR's persistently <90 mL/min signify possible Chronic Kidney Disease.   Georgiann Hahn gap 06/06/2014 12  5 - 15 Final  Hospital Outpatient Visit on 05/27/2014  Component Date Value Ref Range Status  . MRSA, PCR 05/27/2014 NEGATIVE  NEGATIVE Final  . Staphylococcus aureus 05/27/2014 NEGATIVE  NEGATIVE Final   Comment:                                 The Xpert SA Assay (FDA                          approved for NASAL specimens                          in patients over 36 years of age),                          is one component of                          a comprehensive surveillance                          program.  Test performance has                          been validated by [employer] for patients greater                          than or equal to 36 year old.                          It is not intended                          to diagnose infection nor to                          guide or monitor treatment.  [name] aPTT 05/27/2014 35  24 - 37 seconds Final  . WBC 05/27/2014 6.7  4.0 - 10.5 K/uL Final  . RBC 05/27/2014 3.83* 3.87 - 5.11 MIL/uL Final  . Hemoglobin 05/27/2014 11.3* 12.0 - 15.0 g/dL Final  . HCT 05/27/2014 34.7* 36.0 - 46.0 % Final  . MCV 05/27/2014 90.6  78.0 - 100.0 fL Final  . MCH 05/27/2014 29.5  26.0 - 34.0 pg Final  . MCHC 05/27/2014 32.6  30.0 - 36.0 g/dL Final  . RDW 05/27/2014 12.8  11.5 - 15.5 % Final  . Platelets 05/27/2014 291  150 - 400 K/uL Final  . Sodium 05/27/2014 135* 137 - 147 mEq/L Final  . Potassium 05/27/2014 5.1  3.7 - 5.3 mEq/L Final  . Chloride 05/27/2014 99  96 - 112 mEq/L Final  . CO2 05/27/2014 25  19 - 32 mEq/L Final  . Glucose, Bld 05/27/2014 122* 70 - 99 mg/dL Final  . BUN 05/27/2014 18  6 - 23 mg/dL Final  . Creatinine, Ser 05/27/2014 0.91  0.50 - 1.10 mg/dL Final  . Calcium 05/27/2014 9.6  8.4 - 10.5 mg/dL Final  . Total Protein 05/27/2014 7.2  6.0 - 8.3 g/dL Final  . Albumin 05/27/2014  3.8  3.5 - 5.2 g/dL Final  . AST 05/27/2014 23  0 - 37 U/L Final  . ALT 05/27/2014 19  0 - 35 U/L Final  . Alkaline Phosphatase 05/27/2014 81  39 - 117 U/L Final  . Total Bilirubin 05/27/2014 0.3  0.3 - 1.2 mg/dL Final  . GFR calc non Af Amer 05/27/2014 60* >90 mL/min Final  . GFR calc Af Amer 05/27/2014 70* >90 mL/min Final   Comment: (NOTE)                          The eGFR has been calculated using the CKD EPI equation.                          This calculation has not been validated in all clinical situations.                          eGFR's persistently <90 mL/min signify possible Chronic Kidney                          Disease.  . Anion gap 05/27/2014 11  5 - 15 Final  . Prothrombin Time 05/27/2014 13.9  11.6 - 15.2 seconds Final  . INR 05/27/2014 1.05  0.00 - 1.49 Final  . Color, Urine 05/27/2014 YELLOW  YELLOW Final  . APPearance 05/27/2014 CLEAR  CLEAR Final  . Specific Gravity, Urine 05/27/2014 1.005  1.005 - 1.030 Final  . pH 05/27/2014 5.5  5.0 - 8.0 Final  . Glucose, UA 05/27/2014 NEGATIVE  NEGATIVE mg/dL Final  . Hgb urine dipstick 05/27/2014 NEGATIVE  NEGATIVE Final  . Bilirubin Urine 05/27/2014 NEGATIVE  NEGATIVE Final  . Ketones, ur 05/27/2014 NEGATIVE  NEGATIVE mg/dL Final  . Protein, ur 05/27/2014 NEGATIVE  NEGATIVE mg/dL Final  . Urobilinogen, UA 05/27/2014 0.2  0.0 - 1.0 mg/dL Final  . Nitrite 05/27/2014 NEGATIVE  NEGATIVE Final  . Leukocytes, UA 05/27/2014 NEGATIVE  NEGATIVE Final   MICROSCOPIC NOT DONE ON URINES WITH NEGATIVE PROTEIN, BLOOD, LEUKOCYTES, NITRITE, OR GLUCOSE <1000 mg/dL.  . ABO/RH(D) 05/27/2014 A POS   Final  . Antibody Screen 05/27/2014 NEG   Final  .  Sample Expiration 05/27/2014 06/07/2014   Final  Office Visit on 04/24/2014  Component Date Value Ref Range Status  . Glucose 04/24/2014 98  65 - 99 mg/dL Final  . BUN 04/24/2014 17  8 - 27 mg/dL Final  . Creatinine, Ser 04/24/2014 0.82  0.57 - 1.00 mg/dL Final  . GFR calc non Af Amer 04/24/2014  70  >59 mL/min/1.73 Final  . GFR calc Af Amer 04/24/2014 81  >59 mL/min/1.73 Final  . BUN/Creatinine Ratio 04/24/2014 21  11 - 26 Final  . Sodium 04/24/2014 139  134 - 144 mmol/L Final  . Potassium 04/24/2014 5.3* 3.5 - 5.2 mmol/L Final  . Chloride 04/24/2014 100  97 - 108 mmol/L Final  . CO2 04/24/2014 25  18 - 29 mmol/L Final  . Calcium 04/24/2014 9.8  8.7 - 10.3 mg/dL Final  . WBC 04/24/2014 7.9  3.4 - 10.8 x10E3/uL Final  . RBC 04/24/2014 3.84  3.77 - 5.28 x10E6/uL Final  . Hemoglobin 04/24/2014 11.4  11.1 - 15.9 g/dL Final  . HCT 04/24/2014 34.5  34.0 - 46.6 % Final  . MCV 04/24/2014 90  79 - 97 fL Final  . MCH 04/24/2014 29.7  26.6 - 33.0 pg Final  . MCHC 04/24/2014 33.0  31.5 - 35.7 g/dL Final  . RDW 04/24/2014 14.2  12.3 - 15.4 % Final  . Platelets 04/24/2014 289  150 - 379 x10E3/uL Final  . Neutrophils Relative % 04/24/2014 53   Final  . Lymphs 04/24/2014 33   Final  . Monocytes 04/24/2014 8   Final  . Eos 04/24/2014 5   Final  . Basos 04/24/2014 1   Final  . Neutrophils Absolute 04/24/2014 4.3  1.4 - 7.0 x10E3/uL Final  . Lymphocytes Absolute 04/24/2014 2.6  0.7 - 3.1 x10E3/uL Final  . Monocytes Absolute 04/24/2014 0.6  0.1 - 0.9 x10E3/uL Final  . Eosinophils Absolute 04/24/2014 0.4  0.0 - 0.4 x10E3/uL Final  . Basophils Absolute 04/24/2014 0.0  0.0 - 0.2 x10E3/uL Final  . Immature Granulocytes 04/24/2014 0   Final  . Immature Grans (Abs) 04/24/2014 0.0  0.0 - 0.1 x10E3/uL Final     X-Rays:Dg Pelvis Portable  06/04/2014   CLINICAL DATA:  Status post arthroplasty  EXAM: DG C-ARM 1-60 MIN - NRPT [facility]; PORTABLE PELVIS 1-2 VIEWS  COMPARISON:  None.  FINDINGS: There is a total hip prosthesis on the right which appears well seated. No fracture or dislocation is seen on this single view. There is a drain in the superior aspect of the right hip joint. There is mild narrowing of the left hip joint.  IMPRESSION: Prosthesis on the right appears well-seated. No fracture or dislocation.    Electronically Signed   By: Lowella Grip M.D.   On: 06/04/2014 15:42   Dg C-arm 1-60 Min-no Report  06/04/2014   CLINICAL DATA:  Status post arthroplasty  EXAM: DG C-ARM 1-60 MIN - NRPT [facility]; PORTABLE PELVIS 1-2 VIEWS  COMPARISON:  None.  FINDINGS: There is a total hip prosthesis on the right which appears well seated. No fracture or dislocation is seen on this single view. There is a drain in the superior aspect of the right hip joint. There is mild narrowing of the left hip joint.  IMPRESSION: Prosthesis on the right appears well-seated. No fracture or dislocation.   Electronically Signed   By: Lowella Grip M.D.   On: 06/04/2014 15:42    EKG: Orders placed or performed in visit on 05/06/14  .  EKG 12-Lead     Hospital Course: Patient was admitted to [facility] and taken to the OR and underwent the above state procedure without complications.  Patient tolerated the procedure well and was later transferred to the recovery room and then to the orthopaedic floor for postoperative care.  They were given PO and IV analgesics for pain control following their surgery.  They were given 24 hours of postoperative antibiotics of  Anti-infectives    Start     Dose/Rate Route Frequency Ordered Stop   06/04/14 2000  ceFAZolin (ANCEF) IVPB 2 g/50 mL premix     2 g100 mL/hr over 30 Minutes Intravenous Every 6 hours 06/04/14 1708 06/05/14 0300   06/04/14 1102  ceFAZolin (ANCEF) IVPB 2 g/50 mL premix     2 g100 mL/hr over 30 Minutes Intravenous On call to O.R. 06/04/14 1102 06/04/14 1357     and started on DVT prophylaxis in the form of Xarelto.   PT and OT were ordered for total hip protocol.  The patient was allowed to be WBAT with therapy. Discharge planning was consulted to help with postop disposition and equipment needs.  Patient had a decent night on the evening of surgery but did not rest well.  They started to get up OOB with therapy on day one.  Hemovac drain was pulled without  difficulty.  Continued to work with therapy into day two.  Dressing was changed on day two and the incision was healing well.  Patient was seen in rounds and was ready to go home.  Discharge home with home health Diet - regular diet Follow up - in 2 weeks Activity - WBAT Disposition - Home Condition Upon Discharge - Stable D/C Meds - See DC Summary DVT Prophylaxis - Xarelto  Discharge Instructions    Call MD / Call 911    Complete by:  As directed   If you experience chest pain or shortness of breath, CALL 911 and be transported to the hospital emergency room.  If you develope a fever above 101 F, pus (white drainage) or increased drainage or redness at the wound, or calf pain, call your surgeon's office.     Change dressing    Complete by:  As directed   You may change your dressing dressing daily with sterile 4 x 4 inch gauze dressing and paper tape.  Do not submerge the incision under water.     Constipation Prevention    Complete by:  As directed   Drink plenty of fluids.  Prune juice may be helpful.  You may use a stool softener, such as Colace (over the counter) 100 mg twice a day.  Use MiraLax (over the counter) for constipation as needed.     Diet - low sodium heart healthy    Complete by:  As directed      Discharge instructions    Complete by:  As directed   Pick up stool softner and laxative for home. Do not submerge incision under water. May shower. Continue to use ice for pain and swelling from surgery.  Total Hip Protocol.  Take Xarelto for two and a half more weeks, then discontinue Xarelto. Once the patient has completed the blood thinner regimen, then take a Baby 81 mg Aspirin daily for three more weeks.     Do not sit on low chairs, stoools or toilet seats, as it may be difficult to get up from low surfaces    Complete by:  As directed  Driving restrictions    Complete by:  As directed   No driving until released by the physician.     Increase activity  slowly as tolerated    Complete by:  As directed      Lifting restrictions    Complete by:  As directed   No lifting until released by the physician.     Patient may shower    Complete by:  As directed   You may shower without a dressing once there is no drainage.  Do not wash over the wound.  If drainage remains, do not shower until drainage stops.     TED hose    Complete by:  As directed   Use stockings (TED hose) for 3 weeks on both leg(s).  You may remove them at night for sleeping.     Weight bearing as tolerated    Complete by:  As directed             Medication List    STOP taking these medications        Fish Oil 1200 MG Caps     HYDROcodone-acetaminophen 5-325 MG per tablet  Commonly known as:  NORCO/VICODIN     ICAPS Caps     Lutein 6 MG Caps     Tdap 5-2.5-18.5 LF-MCG/0.5 injection  Commonly known as:  BOOSTRIX      TAKE these medications        acetaminophen 500 MG tablet  Commonly known as:  TYLENOL  Take 1,000 mg by mouth every 8 (eight) hours as needed for mild pain or headache.     atorvastatin 10 MG tablet  Commonly known as:  LIPITOR  Take 5 mg by mouth at bedtime.     doxylamine (Sleep) 25 MG tablet  Commonly known as:  UNISOM  Take 25 mg by mouth at bedtime as needed for sleep.     EX-LAX PO  Take 1 tablet by mouth daily as needed (Constipation).     iron polysaccharides 150 MG capsule  Commonly known as:  NIFEREX  Take 1 capsule (150 mg total) by mouth 2 (two) times daily.     latanoprost 0.005 % ophthalmic solution  Commonly known as:  XALATAN  Place 1 drop into both eyes at bedtime.     methocarbamol 500 MG tablet  Commonly known as:  ROBAXIN  Take 1 tablet (500 mg total) by mouth every 6 (six) hours as needed for muscle spasms.     oxyCODONE 5 MG immediate release tablet  Commonly known as:  Oxy IR/ROXICODONE  Take 1-2 tablets (5-10 mg total) by mouth every 3 (three) hours as needed for moderate pain, severe pain or  breakthrough pain.     rivaroxaban 10 MG Tabs tablet  Commonly known as:  XARELTO  - Take 1 tablet (10 mg total) by mouth daily with breakfast. Take Xarelto for two and a half more weeks, then discontinue Xarelto.  - Once the patient has completed the blood thinner regimen, then take a Baby 81 mg Aspirin daily for three more weeks.     timolol 0.5 % ophthalmic solution  Commonly known as:  TIMOPTIC  Place 1 drop into both eyes 2 (two) times daily.           Follow-up Information    Follow up with [facility].   Why:  home health physical therapy   Contact information:   [address] [place] [address] [phone]  Follow up with Gearlean Alf, MD. Schedule an appointment as soon as possible for a visit on 06/17/2014.   Specialty:  Orthopedic Surgery   Why:  Call office at [phone] to set up appointment on Tuesday 06/17/2014   Contact information:   [address] [place] [address] [phone]       Signed: Arlee Muslim, PA-C Orthopaedic Surgery 06/06/2014, 7:48 AM

## 2014-06-06 NOTE — Plan of Care (Signed)
Problem: Discharge Progression Outcomes Goal: Barriers To Progression Addressed/Resolved Outcome: Not Applicable Date Met:  85/90/93 Goal: CMS/Neurovascular status at or above baseline Outcome: Completed/Met Date Met:  06/06/14 Goal: Anticoagulant follow-up in place Outcome: Completed/Met Date Met:  06/06/14 Goal: Pain controlled with appropriate interventions Outcome: Completed/Met Date Met:  06/06/14 Goal: Hemodynamically stable Outcome: Completed/Met Date Met:  06/21/61 Goal: Complications resolved/controlled Outcome: Completed/Met Date Met:  06/06/14 Goal: Tolerates diet Outcome: Completed/Met Date Met:  06/06/14 Goal: Activity appropriate for discharge plan Outcome: Completed/Met Date Met:  06/06/14 Goal: Ambulates safely using assistive device Outcome: Completed/Met Date Met:  06/06/14 Goal: Follows weight - bearing limitations Outcome: Completed/Met Date Met:  06/06/14 Goal: Discharge plan in place and appropriate Outcome: Completed/Met Date Met:  06/06/14 Goal: Negotiates stairs Outcome: Completed/Met Date Met:  06/06/14 Goal: Demonstrates ADLs as appropriate Outcome: Completed/Met Date Met:  06/06/14 Goal: Incision without S/S infection Outcome: Completed/Met Date Met:  06/06/14

## 2014-07-04 ENCOUNTER — Telehealth: Payer: Self-pay | Admitting: *Deleted

## 2014-07-04 NOTE — Telephone Encounter (Signed)
Received Fax order from [facility] for Bone Density due to Low Bone Mass/ 2 Year follow up. Stamped and fax back.

## 2014-07-15 ENCOUNTER — Other Ambulatory Visit: Payer: Self-pay | Admitting: *Deleted

## 2014-07-15 MED ORDER — ATORVASTATIN CALCIUM 10 MG PO TABS: ORAL_TABLET | ORAL | Status: DC

## 2014-07-15 NOTE — Telephone Encounter (Signed)
[facility] 

## 2014-08-11 DIAGNOSIS — M8580 Other specified disorders of bone density and structure, unspecified site: Secondary | ICD-10-CM | POA: Diagnosis not present

## 2014-08-11 DIAGNOSIS — Z1231 Encounter for screening mammogram for malignant neoplasm of breast: Secondary | ICD-10-CM | POA: Diagnosis not present

## 2014-08-11 LAB — HM MAMMOGRAPHY: HM Mammogram: NORMAL

## 2014-08-11 LAB — HM DEXA SCAN

## 2014-08-12 ENCOUNTER — Encounter: Payer: Self-pay | Admitting: *Deleted

## 2014-08-15 ENCOUNTER — Encounter: Payer: Self-pay | Admitting: *Deleted

## 2014-09-09 ENCOUNTER — Other Ambulatory Visit: Payer: Self-pay | Admitting: *Deleted

## 2014-09-09 DIAGNOSIS — E789 Disorder of lipoprotein metabolism, unspecified: Secondary | ICD-10-CM

## 2014-09-09 DIAGNOSIS — Z0000 Encounter for general adult medical examination without abnormal findings: Secondary | ICD-10-CM

## 2014-09-09 DIAGNOSIS — D649 Anemia, unspecified: Secondary | ICD-10-CM

## 2014-09-23 ENCOUNTER — Other Ambulatory Visit: Payer: Medicare Other

## 2014-09-23 DIAGNOSIS — Z0000 Encounter for general adult medical examination without abnormal findings: Secondary | ICD-10-CM

## 2014-09-23 DIAGNOSIS — E7800 Pure hypercholesterolemia, unspecified: Secondary | ICD-10-CM

## 2014-09-23 DIAGNOSIS — D649 Anemia, unspecified: Secondary | ICD-10-CM | POA: Diagnosis not present

## 2014-09-23 DIAGNOSIS — E789 Disorder of lipoprotein metabolism, unspecified: Secondary | ICD-10-CM | POA: Diagnosis not present

## 2014-09-24 ENCOUNTER — Encounter: Payer: Medicare Other | Admitting: Nurse Practitioner

## 2014-09-24 LAB — COMPREHENSIVE METABOLIC PANEL
A/G RATIO: 2 (ref 1.1–2.5)
ALT: 18 IU/L (ref 0–32)
AST: 20 IU/L (ref 0–40)
Albumin: 4.3 g/dL (ref 3.5–4.8)
Alkaline Phosphatase: 64 IU/L (ref 39–117)
BUN/Creatinine Ratio: 15 (ref 11–26)
BUN: 15 mg/dL (ref 8–27)
Bilirubin Total: 0.3 mg/dL (ref 0.0–1.2)
CHLORIDE: 98 mmol/L (ref 97–108)
CO2: 23 mmol/L (ref 18–29)
Calcium: 9.5 mg/dL (ref 8.7–10.3)
Creatinine, Ser: 0.97 mg/dL (ref 0.57–1.00)
GFR calc Af Amer: 66 mL/min/{1.73_m2} (ref >59)
GFR, EST NON AFRICAN AMERICAN: 57 mL/min/{1.73_m2} — AB (ref >59)
GLUCOSE: 90 mg/dL (ref 65–99)
Globulin, Total: 2.2 g/dL (ref 1.5–4.5)
POTASSIUM: 4.9 mmol/L (ref 3.5–5.2)
SODIUM: 137 mmol/L (ref 134–144)
TOTAL PROTEIN: 6.5 g/dL (ref 6.0–8.5)

## 2014-09-24 LAB — LIPID PANEL
Chol/HDL Ratio: 2.8 ratio units (ref 0.0–4.4)
Cholesterol, Total: 213 mg/dL — ABNORMAL HIGH (ref 100–199)
HDL: 77 mg/dL (ref >39)
LDL CALC: 115 mg/dL — AB (ref 0–99)
Triglycerides: 107 mg/dL (ref 0–149)
VLDL CHOLESTEROL CAL: 21 mg/dL (ref 5–40)

## 2014-09-24 LAB — CBC WITH DIFFERENTIAL/PLATELET
BASOS ABS: 0 10*3/uL (ref 0.0–0.2)
Basos: 0 %
Eos: 4 %
Eosinophils Absolute: 0.3 10*3/uL (ref 0.0–0.4)
HCT: 37.3 % (ref 34.0–46.6)
HEMOGLOBIN: 12 g/dL (ref 11.1–15.9)
Immature Grans (Abs): 0 10*3/uL (ref 0.0–0.1)
Immature Granulocytes: 0 %
Lymphocytes Absolute: 2.3 10*3/uL (ref 0.7–3.1)
Lymphs: 37 %
MCH: 28.5 pg (ref 26.6–33.0)
MCHC: 32.2 g/dL (ref 31.5–35.7)
MCV: 89 fL (ref 79–97)
MONOCYTES: 10 %
MONOS ABS: 0.6 10*3/uL (ref 0.1–0.9)
NEUTROS PCT: 49 %
Neutrophils Absolute: 2.9 10*3/uL (ref 1.4–7.0)
Platelets: 301 10*3/uL (ref 150–379)
RBC: 4.21 x10E6/uL (ref 3.77–5.28)
RDW: 14.8 % (ref 12.3–15.4)
WBC: 6 10*3/uL (ref 3.4–10.8)

## 2014-09-25 ENCOUNTER — Encounter: Payer: Self-pay | Admitting: Nurse Practitioner

## 2014-09-25 ENCOUNTER — Ambulatory Visit (INDEPENDENT_AMBULATORY_CARE_PROVIDER_SITE_OTHER): Payer: Medicare Other | Admitting: Nurse Practitioner

## 2014-09-25 VITALS — BP 130/84 | HR 74 | Temp 97.4°F | Ht 64.0 in | Wt 143.0 lb

## 2014-09-25 DIAGNOSIS — H3530 Unspecified macular degeneration: Secondary | ICD-10-CM | POA: Diagnosis not present

## 2014-09-25 DIAGNOSIS — M8580 Other specified disorders of bone density and structure, unspecified site: Secondary | ICD-10-CM

## 2014-09-25 DIAGNOSIS — M1611 Unilateral primary osteoarthritis, right hip: Secondary | ICD-10-CM

## 2014-09-25 DIAGNOSIS — E785 Hyperlipidemia, unspecified: Secondary | ICD-10-CM

## 2014-09-25 NOTE — Progress Notes (Signed)
Patient ID: Meredith Moore, female   DOB: Dec 29, 1935, 79 y.o.   MRN: [id number]     PCP: Estill Dooms, MD  Allergies  Allergen Reactions  . Tramadol     Fine when taking it- did not sleep for 6 weeks after stopping medication  . Formaldehyde Rash  . Nickel Rash    Chief Complaint  Patient presents with  . Annual Exam    Comprehensive Exam: cholesterol     HPI: Patient is a 79 y.o. female seen in the office today for yearly follow up. No acute issues.   Colonoscopy- has had 2 in the past, knows she is not due for one at this time, gets reminder  Mammogram- up to date- 06/2015 Bone Density- up to date- 06/2015 Smoker- never smoked Diet- heart healthy Exercise- walk daily- 1 mile Pelvic- hysterectomy last year, no complaints Dentist- Eye doctor- seeing MD for glaucoma and macular degeneration    Right Total hip 06-15-2014-- doing well afterwards no pain, conts to follow up with ortho but not until 9 months.   Advanced Directive information Does patient have an advance directive?: Yes, Type of Advance Directive: [place];Living will Review of Systems:  Review of Systems  Constitutional: Negative for fever and chills.  HENT: Negative for congestion, ear pain, sore throat and tinnitus.   Eyes:       Has advanced macular degeneration and glaucoma  Respiratory: Negative for cough and shortness of breath.   Cardiovascular: Negative for chest pain and palpitations.  Gastrointestinal: Negative for abdominal pain, diarrhea and constipation.  Genitourinary: Negative for dysuria, urgency and frequency.  Musculoskeletal: Negative for myalgias and neck pain.  Skin: Negative.   Neurological: Negative for dizziness, weakness and headaches.  Psychiatric/Behavioral: Positive for sleep disturbance. The patient is not nervous/anxious.     Past Medical History  Diagnosis Date  . Arthritis   . Glaucoma   . Macular degeneration   . Osteoarthrosis,  unspecified whether generalized or localized, unspecified site   . Herpes zoster with unspecified complication   . Hyperlipidemia     under control  . History of transfusion 2014    2 units, no reaction-after knee replacement  . Anemia, unspecified     hx of  . Right hip pain   . Complication of anesthesia     "Blood pressure has gone too low before and effected vision" just with general  . Complication of anesthesia     "woke up in severe panic attack" just with general  . Hip dysplasia     right side   Past Surgical History  Procedure Laterality Date  . Tonsillectomy      1945   . Ovarian tumor   1979    benign tumor removed   . Total knee arthroplasty Right 09/10/2012    Procedure: RIGHT TOTAL KNEE ARTHROPLASTY;  Surgeon: Gearlean Alf, MD;  Location: [facility];  Service: Orthopedics;  Laterality: Right;  . Belpharoptosis repair    . Robotic assisted total hysterectomy with bilateral salpingo oopherectomy Bilateral 03/25/2014    Procedure: ROBOTIC ASSISTED TOTAL HYSTERECTOMY WITH BILATERAL SALPINGO OOPHORECTOMY;  Surgeon: Everitt Amber, MD;  Location: [facility];  Service: Gynecology;  Laterality: Bilateral;  . Appendectomy  1979  . Total hip arthroplasty Right 06/04/2014    Procedure: RIGHT TOTAL HIP ARTHROPLASTY ANTERIOR APPROACH;  Surgeon: Gearlean Alf, MD;  Location: [facility];  Service: Orthopedics;  Laterality: Right;   Social History:   reports that she quit  smoking about 26 years ago. She has never used smokeless tobacco. She reports that she does not drink alcohol or use illicit drugs.  Family History  Problem Relation Age of Onset  . Heart disease Mother   . Heart disease Father     Medications: Patient's Medications  New Prescriptions   No medications on file  Previous Medications   ACETAMINOPHEN (TYLENOL) 500 MG TABLET    Take 1,000 mg by mouth every 8 (eight) hours as needed for mild pain or headache.   ATORVASTATIN (LIPITOR) 10 MG TABLET    Take one tablet by mouth  once daily for cholesterol   DOXYLAMINE, SLEEP, (UNISOM) 25 MG TABLET    Take 25 mg by mouth at bedtime as needed for sleep.   LATANOPROST (XALATAN) 0.005 % OPHTHALMIC SOLUTION    Place 1 drop into both eyes at bedtime.   OXYCODONE (OXY IR/ROXICODONE) 5 MG IMMEDIATE RELEASE TABLET    Take 1-2 tablets (5-10 mg total) by mouth every 3 (three) hours as needed for moderate pain, severe pain or breakthrough pain.   SENNOSIDES (EX-LAX PO)    Take 1 tablet by mouth daily as needed (Constipation).    TIMOLOL (TIMOPTIC) 0.5 % OPHTHALMIC SOLUTION    Place 1 drop into both eyes 2 (two) times daily.  Modified Medications   No medications on file  Discontinued Medications   IRON POLYSACCHARIDES (NIFEREX) 150 MG CAPSULE    Take 1 capsule (150 mg total) by mouth 2 (two) times daily.   METHOCARBAMOL (ROBAXIN) 500 MG TABLET    Take 1 tablet (500 mg total) by mouth every 6 (six) hours as needed for muscle spasms.   RIVAROXABAN (XARELTO) 10 MG TABS TABLET    Take 1 tablet (10 mg total) by mouth daily with breakfast. Take Xarelto for two and a half more weeks, then discontinue Xarelto. Once the patient has completed the blood thinner regimen, then take a Baby 81 mg Aspirin daily for three more weeks.     Physical Exam:  Filed Vitals:   09/25/14 1130  BP: 130/84  Pulse: 74  Temp: 97.4 F (36.3 C)  TempSrc: Oral  Height: 5\' 4"  (1.626 m)  Weight: 143 lb (64.864 kg)  SpO2: 99%    Physical Exam  Constitutional: She is oriented to person, place, and time. She appears well-developed and well-nourished. No distress.  HENT:  Head: Normocephalic and atraumatic.  Right Ear: External ear normal.  Left Ear: External ear normal.  Nose: Nose normal.  Mouth/Throat: Oropharynx is clear and moist. No oropharyngeal exudate.  Eyes: Conjunctivae are normal.  Neck: Normal range of motion. Neck supple. No thyromegaly present.  Cardiovascular: Normal rate, regular rhythm and normal heart sounds.   Pulmonary/Chest:  Effort normal and breath sounds normal.  Abdominal: Soft. Bowel sounds are normal. She exhibits no distension. There is no tenderness.  Musculoskeletal: Normal range of motion. She exhibits no edema or tenderness.  Neurological: She is alert and oriented to person, place, and time. She has normal reflexes.  Skin: Skin is warm and dry. She is not diaphoretic.  Psychiatric: She has a normal mood and affect.    Labs reviewed: Basic Metabolic Panel:  Recent Labs  06/05/14 0455 06/06/14 0500 09/23/14 0857  NA 134* 141 137  K 4.2 4.0 4.9  CL 102 106 98  CO2 21 23 23   GLUCOSE 141* 121* 90  BUN 14 14 15   CREATININE 0.89 0.78 0.97  CALCIUM 8.4 8.7 9.5   Liver Function Tests:  Recent Labs  03/20/14 1410 05/27/14 1400 09/23/14 0857  AST 20 23 20   ALT 16 19 18   ALKPHOS 64 81 64  BILITOT 0.3 0.3 0.3  PROT 7.2 7.2 6.5  ALBUMIN 3.8 3.8  --    No results for input(s): LIPASE, AMYLASE in the last 8760 hours. No results for input(s): AMMONIA in the last 8760 hours. CBC:  Recent Labs  03/20/14 1410  04/24/14 1528  06/05/14 0455 06/06/14 0500 09/23/14 0857  WBC 7.0  < > 7.9  < > 7.8 11.4* 6.0  NEUTROABS 4.5  --  4.3  --   --   --  2.9  HGB 11.8*  < > 11.4  < > 8.7* 8.5* 12.0  HCT 36.3  < > 34.5  < > 26.7* 25.3* 37.3  MCV 91.4  < > 90  < > 89.6 89.4 89  PLT 297  < > 289  < > 226 227 301  < > = values in this interval not displayed. Lipid Panel:  Recent Labs  09/23/14 0857  HDL 77  LDLCALC 115*  TRIG 107  CHOLHDL 2.8   TSH: No results for input(s): TSH in the last 8760 hours. A1C: No results found for: HGBA1C   Assessment/Plan 1. Osteopenia -Dexa scan up to date, conts calcium with Vit D   2. Hyperlipidemia -heart healthy diet, pt reports they do not go out to eat due to her Macular degeneration. To cont lifestyle modifications and statin  3. Macular degeneration -legally blind, follow up with specialist q 3 months.   4. Primary osteoarthritis of right  hip S/p total right hip arthroplasty by Dr Synthia Innocent done 06/04/14. Has done well post op. Off all medications.  5. Preventive Care  PREVENTIVE COUNSELING:  The patient was counseled regarding the appropriate use of alcohol, regular self-examination of the breasts on a monthly basis, prevention of dental and periodontal disease, diet, regular sustained exercise for at least 30 minutes 5 times per week, routine screening interval for mammogram as recommended by the [place] and ACOG, and recommended schedule for GI hemoccult testing, colonoscopy, cholesterol, thyroid and diabetes screening. -pt reports she is up to date on colonoscopy, dental, eye exam, dexa scan, mammogram.

## 2014-09-25 NOTE — Patient Instructions (Signed)

## 2014-09-25 NOTE — Progress Notes (Signed)
27/27 patient unable to see blind from glaucoma and macular deg.

## 2014-10-02 DIAGNOSIS — H4011X3 Primary open-angle glaucoma, severe stage: Secondary | ICD-10-CM | POA: Diagnosis not present

## 2014-10-02 DIAGNOSIS — H3531 Nonexudative age-related macular degeneration: Secondary | ICD-10-CM | POA: Diagnosis not present

## 2015-01-22 DIAGNOSIS — H3531 Nonexudative age-related macular degeneration: Secondary | ICD-10-CM | POA: Diagnosis not present

## 2015-01-22 DIAGNOSIS — H35363 Drusen (degenerative) of macula, bilateral: Secondary | ICD-10-CM | POA: Diagnosis not present

## 2015-02-09 DIAGNOSIS — H3531 Nonexudative age-related macular degeneration: Secondary | ICD-10-CM | POA: Diagnosis not present

## 2015-02-09 DIAGNOSIS — H4011X3 Primary open-angle glaucoma, severe stage: Secondary | ICD-10-CM | POA: Diagnosis not present

## 2015-03-04 ENCOUNTER — Other Ambulatory Visit: Payer: Self-pay | Admitting: Internal Medicine

## 2015-04-21 ENCOUNTER — Ambulatory Visit (INDEPENDENT_AMBULATORY_CARE_PROVIDER_SITE_OTHER): Payer: Medicare Other | Admitting: Nurse Practitioner

## 2015-04-21 ENCOUNTER — Encounter: Payer: Self-pay | Admitting: Nurse Practitioner

## 2015-04-21 VITALS — BP 118/72 | HR 75 | Temp 98.0°F | Resp 14 | Wt 144.0 lb

## 2015-04-21 DIAGNOSIS — G4700 Insomnia, unspecified: Secondary | ICD-10-CM | POA: Diagnosis not present

## 2015-04-21 DIAGNOSIS — Z23 Encounter for immunization: Secondary | ICD-10-CM

## 2015-04-21 MED ORDER — ZOLPIDEM TARTRATE 5 MG PO TABS: 2.5000 mg | ORAL_TABLET | Freq: Every evening | ORAL | Status: DC | PRN

## 2015-04-21 NOTE — Patient Instructions (Signed)
May use Ambien 2.5-5 mg at night as needed for sleep Cont sleep routine.

## 2015-04-21 NOTE — Progress Notes (Signed)
Patient ID: Meredith Moore, female   DOB: [birth year], 79 y.o.   MRN: [id number]    PCP: Estill Dooms, MD  Advanced Directive information Does patient have an advance directive?: Yes, Type of Advance Directive: [place];Living will, Does patient want to make changes to advanced directive?: No - Patient declined  Allergies  Allergen Reactions  . Tramadol     Fine when taking it- did not sleep for 6 weeks after stopping medication  . Formaldehyde Rash  . Nickel Rash    Chief Complaint  Patient presents with  . Acute Visit    Patient c/o sleeping issues: patient having trouble falling asleep x several months   . Immunizations    Flu vaccine today      HPI: Patient is a 79 y.o. female seen in the office today due to not being able to sleep. Started 3 years ago and has just been getting worse. Does not happen every night but happening 3-4 night a week, staying up until 6 am then sleeping until 11 am.  Not depressed, not anxious, no family issues. No pain. Overall happy.  Joined the gym. Not eating chocolate, no caffeine.  Came off glaucoma drops, did not help  Bedroom dark and cool.    Using melatonin which settles her but does not take her over the "hump." also has tired benadryl  After she lays therefore a while she gets out of bed and listens to her talking books.     Review of Systems:  Review of Systems  Constitutional: Negative for fever, chills, activity change, appetite change, fatigue and unexpected weight change.  Eyes:       Has advanced macular degeneration and glaucoma  Respiratory: Negative for cough and shortness of breath.   Cardiovascular: Negative for chest pain.  Gastrointestinal: Negative for abdominal pain, diarrhea and constipation.  Genitourinary: Negative for dysuria.  Musculoskeletal: Negative for myalgias and neck pain.  Skin: Negative.   Neurological: Negative for dizziness, weakness and headaches.  Psychiatric/Behavioral:  Positive for sleep disturbance. Negative for behavioral problems, confusion, decreased concentration and agitation. The patient is not nervous/anxious.     Past Medical History  Diagnosis Date  . Arthritis   . Glaucoma   . Macular degeneration   . Osteoarthrosis, unspecified whether generalized or localized, unspecified site   . Herpes zoster with unspecified complication   . Hyperlipidemia     under control  . History of transfusion 2014    2 units, no reaction-after knee replacement  . Right hip pain   . Complication of anesthesia     "Blood pressure has gone too low before and effected vision" just with general  . Complication of anesthesia     "woke up in severe panic attack" just with general  . Hip dysplasia     right side   Past Surgical History  Procedure Laterality Date  . Tonsillectomy      1945   . Ovarian tumor   1979    benign tumor removed   . Total knee arthroplasty Right 09/10/2012    Procedure: RIGHT TOTAL KNEE ARTHROPLASTY;  Surgeon: Gearlean Alf, MD;  Location: [facility];  Service: Orthopedics;  Laterality: Right;  . Belpharoptosis repair    . Robotic assisted total hysterectomy with bilateral salpingo oopherectomy Bilateral 03/25/2014    Procedure: ROBOTIC ASSISTED TOTAL HYSTERECTOMY WITH BILATERAL SALPINGO OOPHORECTOMY;  Surgeon: Everitt Amber, MD;  Location: [facility];  Service: Gynecology;  Laterality: Bilateral;  . Appendectomy  1979  . Total hip arthroplasty Right 06/04/2014    Procedure: RIGHT TOTAL HIP ARTHROPLASTY ANTERIOR APPROACH;  Surgeon: Gearlean Alf, MD;  Location: [facility];  Service: Orthopedics;  Laterality: Right;   Social History:   reports that she quit smoking about 26 years ago. She has never used smokeless tobacco. She reports that she does not drink alcohol or use illicit drugs.  Family History  Problem Relation Age of Onset  . Heart disease Mother   . Heart disease Father     Medications: Patient's Medications  New Prescriptions    No medications on file  Previous Medications   ACETAMINOPHEN (TYLENOL) 500 MG TABLET    Take 1,000 mg by mouth every 8 (eight) hours as needed for mild pain or headache.   ATORVASTATIN (LIPITOR) 10 MG TABLET    Take one tablet by mouth once daily for cholesterol   ATORVASTATIN (LIPITOR) 10 MG TABLET    TAKE ONE-HALF TABLET BY MOUTH AT BEDTIME FOR CHOLESTEROL   DOXYLAMINE, SLEEP, (UNISOM) 25 MG TABLET    Take 25 mg by mouth at bedtime as needed for sleep.   LATANOPROST (XALATAN) 0.005 % OPHTHALMIC SOLUTION    Place 1 drop into both eyes at bedtime.   MULTIPLE MINERALS-VITAMINS (CALCIUM & VIT D3 BONE HEALTH PO)    Take by mouth. 1 tablet daily   MULTIPLE VITAMINS-MINERALS (EYE-VITE EXTRA PLUS LUTEIN PO)    Take by mouth. 1 tablet twice daily   OMEGA-3 FATTY ACIDS (FISH OIL) 1200 MG CAPS    Take by mouth. 1 tablet twice daily   SENNOSIDES (EX-LAX PO)    Take 1 tablet by mouth daily as needed (Constipation).    TIMOLOL (TIMOPTIC) 0.5 % OPHTHALMIC SOLUTION    Place 1 drop into both eyes 2 (two) times daily.  Modified Medications   No medications on file  Discontinued Medications   No medications on file     Physical Exam:  Filed Vitals:   04/21/15 1522  BP: 118/72  Pulse: 75  Temp: 98 F (36.7 C)  TempSrc: Oral  Resp: 14  Weight: 144 lb (65.318 kg)  SpO2: 98%   Body mass index is 24.71 kg/(m^2).  Physical Exam  Constitutional: She is oriented to person, place, and time. She appears well-developed and well-nourished. No distress.  Neck: Normal range of motion. Neck supple.  Cardiovascular: Normal rate, regular rhythm and normal heart sounds.   Pulmonary/Chest: Effort normal and breath sounds normal.  Abdominal: Soft. Bowel sounds are normal.  Neurological: She is alert and oriented to person, place, and time.  Skin: Skin is warm and dry. She is not diaphoretic.  Psychiatric: She has a normal mood and affect. Her behavior is normal. Judgment and thought content normal.    Labs  reviewed: Basic Metabolic Panel:  Recent Labs  06/05/14 0455 06/06/14 0500 09/23/14 0857  NA 134* 141 137  K 4.2 4.0 4.9  CL 102 106 98  CO2 21 23 23   GLUCOSE 141* 121* 90  BUN 14 14 15   CREATININE 0.89 0.78 0.97  CALCIUM 8.4 8.7 9.5   Liver Function Tests:  Recent Labs  05/27/14 1400 09/23/14 0857  AST 23 20  ALT 19 18  ALKPHOS 81 64  BILITOT 0.3 0.3  PROT 7.2 6.5  ALBUMIN 3.8  --    No results for input(s): LIPASE, AMYLASE in the last 8760 hours. No results for input(s): AMMONIA in the last 8760 hours. CBC:  Recent Labs  04/24/14 1528  06/05/14 0455 06/06/14 0500 09/23/14 0857  WBC 7.9  < > 7.8 11.4* 6.0  NEUTROABS 4.3  --   --   --  2.9  HGB 11.4  < > 8.7* 8.5* 12.0  HCT 34.5  < > 26.7* 25.3* 37.3  MCV 90  < > 89.6 89.4 89  PLT 289  < > 226 227 301  < > = values in this interval not displayed. Lipid Panel:  Recent Labs  09/23/14 0857  CHOL 213*  HDL 77  LDLCALC 115*  TRIG 107  CHOLHDL 2.8   TSH: No results for input(s): TSH in the last 8760 hours. A1C: No results found for: HGBA1C   Assessment/Plan 1. Need for influenza vaccination - Flu Vaccine QUAD 36+ mos PF IM (Fluarix & Fluzone Quad PF)  2. Insomnia -cont sleep regimen, exercise during the day, avoidance of caffeine  - zolpidem (AMBIEN) 5 MG tablet; Take 0.5-1 tablets (2.5-5 mg total) by mouth at bedtime as needed for sleep.  Dispense: 30 tablet; Refill: 3  Keep routine follow up, follow up sooner if needed  Jessica K. Harle Battiest  [facility] [phone] 8 am - 5 pm) [phone] (after hours) s

## 2015-04-23 DIAGNOSIS — H4011X3 Primary open-angle glaucoma, severe stage: Secondary | ICD-10-CM | POA: Diagnosis not present

## 2015-05-25 ENCOUNTER — Telehealth: Payer: Self-pay | Admitting: *Deleted

## 2015-05-25 DIAGNOSIS — G4700 Insomnia, unspecified: Secondary | ICD-10-CM

## 2015-05-25 NOTE — Telephone Encounter (Signed)
Patient called and stated that she would like a referral to the neurologist due to Insomnia. Ambien does not work consistently. Sometimes she will sleep 2 hours, 5 hours or none at all. Dr. Bing Plume told her that if she couldn't get the Ambien to work to go to the new neurologist in town Dr. Asencion Partridge Dohmier at [facility]. Please Advise.

## 2015-05-25 NOTE — Telephone Encounter (Signed)
Okay for referral but I believe she needs an appt to get referral, please verify with dorothy on this.

## 2015-05-26 NOTE — Telephone Encounter (Signed)
Referral Placed 

## 2015-06-16 ENCOUNTER — Encounter: Payer: Self-pay | Admitting: Neurology

## 2015-06-16 ENCOUNTER — Ambulatory Visit (INDEPENDENT_AMBULATORY_CARE_PROVIDER_SITE_OTHER): Payer: Medicare Other | Admitting: Neurology

## 2015-06-16 VITALS — BP 132/80 | HR 84 | Resp 20 | Ht 64.0 in | Wt 143.0 lb

## 2015-06-16 DIAGNOSIS — Q159 Congenital malformation of eye, unspecified: Secondary | ICD-10-CM | POA: Diagnosis not present

## 2015-06-16 DIAGNOSIS — G4700 Insomnia, unspecified: Secondary | ICD-10-CM

## 2015-06-16 DIAGNOSIS — H4053X3 Glaucoma secondary to other eye disorders, bilateral, severe stage: Secondary | ICD-10-CM | POA: Diagnosis not present

## 2015-06-16 DIAGNOSIS — H353190 Nonexudative age-related macular degeneration, unspecified eye, stage unspecified: Secondary | ICD-10-CM | POA: Diagnosis not present

## 2015-06-16 DIAGNOSIS — F5104 Psychophysiologic insomnia: Secondary | ICD-10-CM

## 2015-06-16 MED ORDER — QUETIAPINE FUMARATE 25 MG PO TABS: 25.0000 mg | ORAL_TABLET | Freq: Every day | ORAL | Status: DC

## 2015-06-16 NOTE — Progress Notes (Signed)
S[facility]EP MED[facility]INE [facility]   Provider:  Larey Seat, M D  Referring Provider: Calvert Cantor, MD  Primary Care Physician:  Estill Dooms, MD  Chief Complaint  Patient presents with  . New Patient (Initial Visit)    referred by Dr. Bing Plume for osa, pt reports that it is for insomnia, rm 10 with husband    Chief complaint according to patient : since knee replacement surgery 2013 , was placed on tramadol and slept well. After the medication was d/c she couldn't sleep well.   HPI:  Meredith Moore is a 79 y.o. female , blind sec. to glaucoma, seen here as a referral  from Dr. Bing Plume, MD for a sleep evaluation.   Meredith Moore describes that she developed insomnia about 3 years ago. She had a knee replacement surgery with Dr. Maureen Ralphs, and was placed on tramadol after surgery. She could sleep very well with it. Then in February 2014 the medication was discontinued and the patient begun having insomnia problems. She would be experiencing difficulties in falling asleep as well as staying asleep. She tried multiple over-the-counter medications including Unisom and Benadryl but none seem to really induce a restful and restorative sleep. Finally her primary care physician, Dr. Jeanmarie Hubert, prescribed Ambien. She had an almost paradoxical reaction as she felt that Ambien didn't put her to sleep and she could only sleep about 2 hours under the influence of the medication. Meredith Moore is severely visually impaired, and she enjoyed her dreams. She dreams in technical or vivid dreams and is often disappointed when she wakes up in the morning realizing that she is blind. The dreams were suppressed while she took Ambien. When she discontinued Ambien, she changed to melatonin and tylenol pm. She takes these over-the-counter medications at about 11 PM and falls asleep by 3 or 4 AM sleeping for 4-5 hours.  Sleep habits are as follows: The patient desires to be asleep by midnight but it is impossible for her. She  experiences more severe sleepiness and fatigue around 10 PM usually is yawning but she does not fall asleep when going to bed at that time. She endorsed today's Epworth sleepiness score at only 2 points and the fatigue severity score at only 7 points. This speaks for patient but is not extremely fatigued in daytime. Her husband goes to bed later than she, and he finds her awake. The bedroom is cool, quiet and dark. She get's up after an hour of failing to enter sleep, and listens to audio books for the blind.  Her husband describes that she is audibly breathing or he calls it pulled purring" but that she does not snore. He has not witnessed any apneas. Usually the patient feels that after 4 hours she is ready to get up and she wakes up spontaneously but she is not refreshed or restored to the same level as she used to be by sleep. She wakes up without dry mouth, and without morning headaches. She explained that she had headaches for all of her life but they're not related to sleep. Of her headaches are associated with nausea, they're not associated with photophobia, olfactory or was territory misperceptions. The patient can see light and shapes. Her insomnia is not from lack of photic stimulation.   Sleep medical history and family sleep history: Maternal grandmother lived until 61.77 years of age with marked macular degeneration and glaucoma, mother lived to 32 with glaucoma and macular degeneration. The patient's macular degeneration begun in her 75s.  Social history: married. Blind.   Review of Systems: Out of a complete 14 system review, the patient complains of only the following symptoms, and all other reviewed systems are negative.   Epworth score 4 , Fatigue severity score 7   , depression score 1.    Social History   Social History  . Marital Status: Married    Spouse Name: N/A  . Number of Children: N/A  . Years of Education: N/A   Occupational History  . Not on file.   Social  History Main Topics  . Smoking status: Former Smoker    Quit date: 08/01/1988  . Smokeless tobacco: Never Used  . Alcohol Use: No  . Drug Use: No  . Sexual Activity: Not on file   Other Topics Concern  . Not on file   Social History Narrative   Married   Former smoker 1990   Alcohol none   Exercise walk daily about one mile, gym       Family History  Problem Relation Age of Onset  . Heart disease Mother   . Heart disease Father     Past Medical History  Diagnosis Date  . Arthritis   . Glaucoma   . Macular degeneration   . Osteoarthrosis, unspecified whether generalized or localized, unspecified site   . Herpes zoster with unspecified complication   . Hyperlipidemia     under control  . History of transfusion 2014    2 units, no reaction-after knee replacement  . Right hip pain   . Complication of anesthesia     "Blood pressure has gone too low before and effected vision" just with general  . Complication of anesthesia     "woke up in severe panic attack" just with general  . Hip dysplasia     right side    Past Surgical History  Procedure Laterality Date  . Tonsillectomy      1945   . Ovarian tumor   1979    benign tumor removed   . Total knee arthroplasty Right 09/10/2012    Procedure: RIGHT TOTAL KNEE ARTHROPLASTY;  Surgeon: Gearlean Alf, MD;  Location: [facility];  Service: Orthopedics;  Laterality: Right;  . Belpharoptosis repair    . Robotic assisted total hysterectomy with bilateral salpingo oopherectomy Bilateral 03/25/2014    Procedure: ROBOTIC ASSISTED TOTAL HYSTERECTOMY WITH BILATERAL SALPINGO OOPHORECTOMY;  Surgeon: Everitt Amber, MD;  Location: [facility];  Service: Gynecology;  Laterality: Bilateral;  . Appendectomy  1979  . Total hip arthroplasty Right 06/04/2014    Procedure: RIGHT TOTAL HIP ARTHROPLASTY ANTERIOR APPROACH;  Surgeon: Gearlean Alf, MD;  Location: [facility];  Service: Orthopedics;  Laterality: Right;    Current Outpatient Prescriptions    Medication Sig Dispense Refill  . acetaminophen (TYLENOL) 500 MG tablet Take 1,000 mg by mouth every 8 (eight) hours as needed for mild pain or headache.    [name] atorvastatin (LIPITOR) 10 MG tablet TAKE ONE-HALF TABLET BY MOUTH AT BEDTIME FOR CHOLESTEROL 45 tablet 1  . latanoprost (XALATAN) 0.005 % ophthalmic solution Place 1 drop into both eyes at bedtime.    . Multiple Minerals-Vitamins (CALCIUM & VIT D3 BONE HEALTH PO) Take by mouth. 1 tablet daily    . Multiple Vitamins-Minerals (EYE-VITE EXTRA PLUS LUTEIN PO) Take by mouth. 1 tablet twice daily    . Sennosides (EX-LAX PO) Take 1 tablet by mouth daily as needed (Constipation).     . timolol (TIMOPTIC) 0.5 % ophthalmic solution Place 1  drop into both eyes 2 (two) times daily.     No current facility-administered medications for this visit.    Allergies as of 06/16/2015 - Review Complete 06/16/2015  Allergen Reaction Noted  . Tramadol  05/27/2014  . Formaldehyde Rash 08/30/2012  . Nickel Rash 08/30/2012    Vitals: BP 132/80 mmHg  Pulse 84  Resp 20  Ht 5\' 4"  (1.626 m)  Wt 143 lb (64.864 kg)  BMI 24.53 kg/m2 Last Weight:  Wt Readings from Last 1 Encounters:  06/16/15 143 lb (64.864 kg)   [id number] mass index is 24.53 kg/(m^2).     Last Height:   Ht Readings from Last 1 Encounters:  06/16/15 5\' 4"  (1.626 m)    Physical exam:  General: The patient is awake, alert and appears not in acute distress. The patient is well groomed. Head: Normocephalic, atraumatic. Neck is supple. Mallampati 2,  neck circumference:13.5 . Nasal airflow unrestricted ,Retrognathia is not seen.  Cardiovascular:  Regular rate and rhythm, without  murmurs or carotid bruit, and without distended neck veins. Respiratory: Lungs are clear to auscultation. Skin:  Without evidence of edema, or rash Trunk:The patient's posture is erect.  Neurologic exam : The patient is awake and alert, oriented to place and time.   Memory subjective described as intact.    MOCA:No flowsheet data found. MMSE: MMSE - Mini Mental State Exam 09/25/2014 09/18/2013  Orientation to time 5 5  Orientation to Place 5 5  Registration 3 3  Attention/ Calculation 5 5  Recall 3 3  Language- name 2 objects 2 2  Language- repeat 1 1  Language- follow 3 step command 3 3  Language- read & follow direction 0 1  Write a sentence 0 1  Copy design 0 1  Total score 27 30       Attention span & concentration ability appears normal.  Speech is fluent,  without ysarthria, dysphonia or aphasia.  Mood and affect are appropriate.  Cranial nerves: Pupils are equal and briskly reactive to light. Funduscopic exam deferred- see Dr. Rachael Fee notes. The patient is status post cataract surgery.  Extraocular movements  in vertical and horizontal planes intact and without nystagmus. Hearing to finger rub intact.   Facial sensation intact to fine touch.  Facial motor strength is symmetric and tongue and uvula move midline. Shoulder shrug was symmetrical.   Motor exam: Normal tone, muscle bulk and symmetric strength in all extremities.  Sensory:  Fine touch, pinprick and vibration were tested in all extremities. Proprioception tested in the upper extremities was normal.  Coordination: Rapid alternating movements in the fingers/hands was normal. Finger-to-nose maneuver  normal without evidence of ataxia, dysmetria or tremor.  Gait and station: Patient walks without assistive device and is able unassisted to climb up to the exam table. Strength within normal limits.  Stance is stable and normal. Turns with 3 Steps. Romberg testing is negative.  Deep tendon reflexes: in the  upper and lower extremities are symmetric and intact. Babinski maneuver response is downgoing.  The patient was advised of the nature of the diagnosed sleep disorder , the treatment options and risks for general a health and wellness arising from not treating the condition.  I spent more than 45 minutes of face to  face time with this blind  patient. Her underlying conditions of glaucoma and macular degeneration will require careful selection of sleep aids. Greater than 50% of time was spent in counseling and coordination of care. We have discussed the diagnosis and  differential and I answered the patient's questions.     Assessment:  After physical and neurologic examination, review of laboratory studies,  Personal review of imaging studies, reports of other /same  Imaging studies ,  Results of polysomnography/ neurophysiology testing and pre-existing records as far as provided in visit., my assessment is   1) Meredith Moore has developed chronic insomnia insomnia that is present for over 2 months is considered chronic. Over-the-counter medications have not helped and she has tried several prescription drugs as well. Unfortunately Ambien did not help her to initiate sleep better or sleep longer. It is interesting that she had reported sleeping better with tramadol. She denies any discomfort pain or achiness in her legs or in her body in general. Tramadol is an opiate. Since there has been no reported sleep apnea I think that she may sleep better using a medication fairly similar to tramadol but of a lower addiction or habit-forming risk.  To make sure that she is safe to use such a medication I would like to obtain a sleep study to rule out apnea. If apnea would be present any narcotic or opiate medication could exacerbate the apnea index. I also want the study to make sure that the patient has enough oxygen and is not sleep hypoxemic. Sleep hypoxemia can lead to daytime fatigue and headaches. The patient has no nocturia complaint and she is not known to be diabetic nor does she have a thyroid dysfunction.  2) she takes lutein and PreserVision eye vitamins. I would like for her to consider 2000 international units of vitamin D a day. This helps with alertness in the day and sleep at night.   3) I will send a letter  to Dr. Maureen Ralphs, requesting the name of the sleep aid that he prescribed in the past for the patient. It may have been temazepam-Restoril. She took oxycodone and sleeps all night.    Plan:  Treatment plan and additional workup :  I would consider Seroquel a safe medication for the patient at 25 mg in the treatment of chronic insomnia. Seroquel can lead to a hangover effect and I would be relying on the patient's feet back as how she tolerates it. I do like for her to take 2000 international units of vitamin D a day they can even be in the form of gummy bears or multivitamins. A lot of my patients take prenatal vitamins because they have vitamin D fully acid and iron and also help with mild anemia. I will order a sleep study to rule out sleep apnea. Revisit after sleep study.   Asencion Partridge Allan Minotti MD  06/16/2015   CC: Estill Dooms, Md [phone] [place], [place] [address]

## 2015-06-16 NOTE — Patient Instructions (Signed)
Quetiapine Extended Release Tablets What is this medicine? QUETIAPINE (kwe TYE a peen) is an antipsychotic. It is used to treat schizophrenia and bipolar disorder, also known as manic-depression. It is also used to treat major depression in combination with antidepressants. This medicine may be used for other purposes; ask your health care provider or pharmacist if you have questions. What should I tell my health care provider before I take this medicine? They need to know if you have any of these conditions: -brain tumor or head injury -breast cancer -cataracts -diabetes -difficulty swallowing -heart disease -kidney disease -liver disease -low blood counts, like low white cell, platelet, or red cell counts -low blood pressure or dizziness when standing up -Parkinson's disease -previous heart attack -seizures -suicidal thoughts, plans, or attempt by you or a family member -thyroid disease -an unusual or allergic reaction to quetiapine, other medicines, foods, dyes, or preservatives -pregnant or trying to get pregnant -breast-feeding How should I use this medicine? Take this medicine by mouth with a glass of water. Follow the directions on the prescription label. Do not cut, crush or chew this medicine. Take this medicine on an empty stomach or with a light meal. Do not take this medicine with a full heavy meal. Take your medicine at regular intervals. Do not take it more often than directed. Do not stop taking except on your doctor's advice. A special MedGuide will be given to you by the pharmacist with each prescription and refill. Be sure to read this information carefully each time. Talk to your pediatrician regarding the use of this medicine in children. While this drug may be prescribed for children as young as 10 years for selected conditions, precautions do apply. Patients over age 31 years may have a stronger reaction to this medicine and need smaller doses. Overdosage: If you  think you have taken too much of this medicine contact a poison control center or emergency room at once. NOTE: This medicine is only for you. Do not share this medicine with others. What if I miss a dose? If you miss a dose, take it as soon as you can. If it is almost time for your next dose, take only that dose. Do not take double or extra doses. What may interact with this medicine? Do not take this medicine with any of the following medications: -certain medicines for fungal infections like fluconazole, itraconazole, ketoconazole, posaconazole, voriconazole -cisapride -dofetilide -dronedarone -droperidol -grepafloxacin -halofantrine -phenothiazines like chlorpromazine, mesoridazine, thioridazine -pimozide -sparfloxacin -ziprasidone This medicine may also interact with the following medications: -alcohol -antiviral medicines for HIV or AIDS -certain medicines for blood pressure -certain medicines for depression, anxiety, or psychotic disturbances like haloperidol, lorazepam -certain medicines for diabetes -certain medicines for Parkinson's disease -certain medicines for seizures like carbamazepine, phenobarbital, phenytoin -cimetidine -erythromycin -other medicines that prolong the QT interval (cause an abnormal heart rhythm) -rifampin -steroid medicines like prednisone or cortisone This list may not describe all possible interactions. Give your health care provider a list of all the medicines, herbs, non-prescription drugs, or dietary supplements you use. Also tell them if you smoke, drink alcohol, or use illegal drugs. Some items may interact with your medicine. What should I watch for while using this medicine? Visit your doctor or health care professional for regular checks on your progress. It may be several weeks before you see the full effects of this medicine. Your health care provider may suggest that you have your eyes examined prior to starting this medicine, and every  6 months  thereafter. If you have been taking this medicine regularly for some time, do not suddenly stop taking it. You must gradually reduce the dose or your symptoms may get worse. Ask your doctor or health care professional for advice. Patients and their families should watch out for depression or thoughts of suicide that get worse. Also watch out for sudden or severe changes in feelings such as feeling anxious, agitated, panicky, irritable, hostile, aggressive, impulsive, severely restless, overly excited and hyperactive, or not being able to sleep. If this happens, especially at the beginning of antidepressant treatment or after a change in dose, call your health care professional. Dennis Bast may get drowsy or dizzy. Do not drive, use machinery, or do anything that needs mental alertness until you know how this medicine affects you. Do not stand or sit up quickly, especially if you are an older patient. This reduces the risk of dizzy or fainting spells. Alcohol may interfere with the effect of this medicine. Avoid alcoholic drinks. Do not treat yourself for colds, diarrhea or allergies. Ask your doctor or health care professional for advice, some ingredients may increase possible side effects. This medicine can reduce the response of your body to heat or cold. Dress warm in cold weather and stay hydrated in hot weather. If possible, avoid extreme temperatures like saunas, hot tubs, very hot or cold showers, or activities that can cause dehydration such as vigorous exercise. What side effects may I notice from receiving this medicine? Side effects that you should report to your doctor or health care professional as soon as possible: -allergic reactions like skin rash, itching or hives, swelling of the face, lips, or tongue -difficulty swallowing -fast or irregular heartbeat -fever or chills, sore throat -fever with rash, swollen lymph nodes, or swelling of the face -increased hunger or thirst -increased  urination -problems with balance, talking, walking -seizures -stiff muscles -suicidal thoughts or other mood changes -uncontrollable head, mouth, neck, arm, or leg movements -unusually weak or tired Side effects that usually do not require medical attention (report to your doctor or health care professional if they continue or are bothersome): -change in sex drive or performance -constipation -drowsy or dizzy -dry mouth -stomach upset -weight gain This list may not describe all possible side effects. Call your doctor for medical advice about side effects. You may report side effects to FDA at 1-800-FDA-1088. Where should I keep my medicine? Keep out of the reach of children. Store at room temperature between 15 and 30 degrees C (59 and 86 degrees F). Throw away any unused medicine after the expiration date. NOTE: This sheet is a summary. It may not cover all possible information. If you have questions about this medicine, talk to your doctor, pharmacist, or health care provider.    2016, [employer]. (2015-01-20 13:10:33)

## 2015-08-04 ENCOUNTER — Other Ambulatory Visit: Payer: Self-pay | Admitting: *Deleted

## 2015-08-04 DIAGNOSIS — E785 Hyperlipidemia, unspecified: Secondary | ICD-10-CM

## 2015-08-04 DIAGNOSIS — Z0000 Encounter for general adult medical examination without abnormal findings: Secondary | ICD-10-CM

## 2015-08-04 DIAGNOSIS — D649 Anemia, unspecified: Secondary | ICD-10-CM

## 2015-08-05 ENCOUNTER — Other Ambulatory Visit: Payer: Medicare Other

## 2015-08-14 DIAGNOSIS — Z1231 Encounter for screening mammogram for malignant neoplasm of breast: Secondary | ICD-10-CM | POA: Diagnosis not present

## 2015-08-14 DIAGNOSIS — Z803 Family history of malignant neoplasm of breast: Secondary | ICD-10-CM | POA: Diagnosis not present

## 2015-08-14 LAB — HM MAMMOGRAPHY: HM Mammogram: NEGATIVE

## 2015-08-17 ENCOUNTER — Encounter: Payer: Self-pay | Admitting: *Deleted

## 2015-08-20 DIAGNOSIS — H353134 Nonexudative age-related macular degeneration, bilateral, advanced atrophic with subfoveal involvement: Secondary | ICD-10-CM | POA: Diagnosis not present

## 2015-08-20 DIAGNOSIS — H401133 Primary open-angle glaucoma, bilateral, severe stage: Secondary | ICD-10-CM | POA: Diagnosis not present

## 2015-09-14 ENCOUNTER — Other Ambulatory Visit: Payer: Self-pay | Admitting: Internal Medicine

## 2015-10-05 ENCOUNTER — Other Ambulatory Visit: Payer: Medicare Other

## 2015-10-05 DIAGNOSIS — E785 Hyperlipidemia, unspecified: Secondary | ICD-10-CM

## 2015-10-05 DIAGNOSIS — D649 Anemia, unspecified: Secondary | ICD-10-CM

## 2015-10-05 DIAGNOSIS — Z0000 Encounter for general adult medical examination without abnormal findings: Secondary | ICD-10-CM

## 2015-10-06 LAB — CBC WITH DIFFERENTIAL/PLATELET
Basophils Absolute: 0 10*3/uL (ref 0.0–0.2)
Basos: 0 %
EOS (ABSOLUTE): 0.1 10*3/uL (ref 0.0–0.4)
Eos: 2 %
Hematocrit: 35.3 % (ref 34.0–46.6)
Hemoglobin: 11.5 g/dL (ref 11.1–15.9)
Immature Grans (Abs): 0 10*3/uL (ref 0.0–0.1)
Immature Granulocytes: 0 %
Lymphocytes Absolute: 1.8 10*3/uL (ref 0.7–3.1)
Lymphs: 33 %
MCH: 29.3 pg (ref 26.6–33.0)
MCHC: 32.6 g/dL (ref 31.5–35.7)
MCV: 90 fL (ref 79–97)
Monocytes Absolute: 0.4 10*3/uL (ref 0.1–0.9)
Monocytes: 8 %
Neutrophils Absolute: 3 10*3/uL (ref 1.4–7.0)
Neutrophils: 57 %
Platelets: 274 10*3/uL (ref 150–379)
RBC: 3.93 x10E6/uL (ref 3.77–5.28)
RDW: 13.9 % (ref 12.3–15.4)
WBC: 5.4 10*3/uL (ref 3.4–10.8)

## 2015-10-06 LAB — COMPREHENSIVE METABOLIC PANEL
ALT: 17 IU/L (ref 0–32)
AST: 19 IU/L (ref 0–40)
Albumin/Globulin Ratio: 1.8 (ref 1.1–2.5)
Albumin: 4.2 g/dL (ref 3.5–4.8)
Alkaline Phosphatase: 64 IU/L (ref 39–117)
BUN/Creatinine Ratio: 19 (ref 11–26)
BUN: 18 mg/dL (ref 8–27)
Bilirubin Total: 0.4 mg/dL (ref 0.0–1.2)
CO2: 23 mmol/L (ref 18–29)
Calcium: 9.2 mg/dL (ref 8.7–10.3)
Chloride: 100 mmol/L (ref 96–106)
Creatinine, Ser: 0.95 mg/dL (ref 0.57–1.00)
GFR calc Af Amer: 67 mL/min/{1.73_m2} (ref >59)
GFR calc non Af Amer: 58 mL/min/{1.73_m2} — ABNORMAL LOW (ref >59)
Globulin, Total: 2.4 g/dL (ref 1.5–4.5)
Glucose: 93 mg/dL (ref 65–99)
Potassium: 5.2 mmol/L (ref 3.5–5.2)
Sodium: 138 mmol/L (ref 134–144)
Total Protein: 6.6 g/dL (ref 6.0–8.5)

## 2015-10-06 LAB — LIPID PANEL
CHOL/HDL RATIO: 2.8 ratio (ref 0.0–4.4)
Cholesterol, Total: 185 mg/dL (ref 100–199)
HDL: 67 mg/dL (ref >39)
LDL Calculated: 100 mg/dL — ABNORMAL HIGH (ref 0–99)
TRIGLYCERIDES: 92 mg/dL (ref 0–149)
VLDL CHOLESTEROL CAL: 18 mg/dL (ref 5–40)

## 2015-10-07 ENCOUNTER — Ambulatory Visit (INDEPENDENT_AMBULATORY_CARE_PROVIDER_SITE_OTHER): Payer: Medicare Other | Admitting: Internal Medicine

## 2015-10-07 VITALS — BP 124/80 | HR 78 | Temp 97.9°F | Resp 20 | Ht 64.0 in | Wt 143.2 lb

## 2015-10-07 DIAGNOSIS — H3530 Unspecified macular degeneration: Secondary | ICD-10-CM | POA: Diagnosis not present

## 2015-10-07 DIAGNOSIS — H409 Unspecified glaucoma: Secondary | ICD-10-CM

## 2015-10-07 DIAGNOSIS — E785 Hyperlipidemia, unspecified: Secondary | ICD-10-CM

## 2015-10-07 DIAGNOSIS — G4700 Insomnia, unspecified: Secondary | ICD-10-CM

## 2015-10-07 DIAGNOSIS — F5104 Psychophysiologic insomnia: Secondary | ICD-10-CM

## 2015-10-07 MED ORDER — QUETIAPINE FUMARATE 25 MG PO TABS: ORAL_TABLET | ORAL | Status: DC

## 2015-10-07 NOTE — Progress Notes (Signed)
Patient ID: Meredith Moore, female   DOB: 1935/09/04, 80 y.o.   MRN: [id number]    HISTORY AND PHYSICAL  Location:    [facility]   Place of Service:   OFFICE  Extended Emergency Contact Information Primary Emergency Contact: Hirata,Charles Address: [address]          [place], [place] [address] [place] Phone: [phone] Mobile Phone: [phone] Relation: Spouse  Advanced Directive information Does patient have an advance directive?: Yes, Type of Advance Directive: Living will, Does patient want to make changes to advanced directive?: No - Patient declined  Chief Complaint  Patient presents with  . Annual Exam    HPI:  Seen 06/15/16 by Dr. Brett Fairy for chronic insomnia. She is using Seroquel under her guidance.  Hyperlipidemia - controlled  Macular degeneration - managed by Dr. Bing Plume. 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 Arthritis   . Glaucoma   . Macular degeneration   . Osteoarthrosis, unspecified whether generalized or localized, unspecified site   . Herpes zoster with unspecified complication   . Hyperlipidemia     under control  . History of transfusion 2014    2 units, no reaction-after knee replacement  . Right hip pain   . Complication of anesthesia     "Blood pressure has gone too low before and effected vision" just with general  . Complication of anesthesia     "woke up in severe panic attack" just with general  . Hip dysplasia     right side    Past Surgical History  Procedure Laterality Date  . Tonsillectomy      1945   . Ovarian tumor   1979    benign tumor removed   . Total knee arthroplasty Right 09/10/2012    Procedure: RIGHT TOTAL KNEE ARTHROPLASTY;  Surgeon: Gearlean Alf, MD;  Location: [facility];  Service: Orthopedics;  Laterality: Right;  . Belpharoptosis repair    . Robotic assisted total  hysterectomy with bilateral salpingo oopherectomy Bilateral 03/25/2014    Procedure: ROBOTIC ASSISTED TOTAL HYSTERECTOMY WITH BILATERAL SALPINGO OOPHORECTOMY;  Surgeon: Everitt Amber, MD;  Location: [facility];  Service: Gynecology;  Laterality: Bilateral;  . Appendectomy  1979  . Total hip arthroplasty Right 06/04/2014    Procedure: RIGHT TOTAL HIP ARTHROPLASTY ANTERIOR APPROACH;  Surgeon: Gearlean Alf, MD;  Location: [facility];  Service: Orthopedics;  Laterality: Right;    Patient Care Team: Estill Dooms, MD as PCP - General (Internal Medicine)  Social History   Social History  . Marital Status: Married    Spouse Name: N/A  . Number of Children: N/A  . Years of Education: N/A   Occupational History  . Not on file.   Social History Main Topics  . Smoking status: Former Smoker    Quit date: 08/01/1988  . Smokeless tobacco: Never Used  . Alcohol Use: No  . Drug Use: No  . Sexual Activity: Not on file   Other Topics Concern  . Not on file   Social History Narrative   Married   Former smoker 1990   Alcohol none   Exercise walk daily about one mile, gym       reports that she quit smoking about 27 years ago. She has never used smokeless tobacco. She reports that she does not drink alcohol or use  illicit drugs.  Family History  Problem Relation Age of Onset  . Heart disease Mother   . Heart disease Father    Family Status  Relation Status Death Age  . Mother Deceased 66  . Father Deceased 42  . Brother Alive   . Daughter Alive   . Son Alive   . Daughter Alive     Immunization History  Administered Date(s) Administered  . Influenza,inj,Quad PF,36+ Mos 04/11/2013, 04/24/2014, 04/21/2015  . Pneumococcal Polysaccharide-23 08/01/2004  . Td 03/22/2011  . Zoster 07/17/2007    Allergies  Allergen Reactions  . Tramadol     Fine when taking it- did not sleep for 6 weeks after stopping medication  . Formaldehyde Rash  . Nickel Rash    Medications: Patient's  Medications  New Prescriptions   No medications on file  Previous Medications   ACETAMINOPHEN (TYLENOL) 500 MG TABLET    Take 1,000 mg by mouth every 8 (eight) hours as needed for mild pain or headache.   ATORVASTATIN (LIPITOR) 10 MG TABLET    TAKE ONE-HALF TABLET BY MOUTH AT BEDTIME FOR CHOLESTEROL   CHOLECALCIFEROL (VITAMIN D3) 2000 UNITS TABS    Take 1 tablet by mouth daily.   LATANOPROST (XALATAN) 0.005 % OPHTHALMIC SOLUTION    Place 1 drop into both eyes at bedtime.   MULTIPLE MINERALS-VITAMINS (CALCIUM & VIT D3 BONE HEALTH PO)    Take by mouth. 1 tablet daily   MULTIPLE VITAMINS-MINERALS (EYE-VITE EXTRA PLUS LUTEIN PO)    Take by mouth. 1 tablet twice daily   QUETIAPINE (SEROQUEL) 25 MG TABLET    Take 1 tablet (25 mg total) by mouth at bedtime.   SENNOSIDES (EX-LAX PO)    Take 1 tablet by mouth daily as needed (Constipation).    TIMOLOL (TIMOPTIC) 0.5 % OPHTHALMIC SOLUTION    Place 1 drop into both eyes 2 (two) times daily.  Modified Medications   No medications on file  Discontinued Medications   No medications on file    Review of Systems  Constitutional: Negative for fever, chills, diaphoresis, activity change, appetite change, fatigue and unexpected weight change.  HENT: Negative for congestion, ear discharge, ear pain, hearing loss, postnasal drip, rhinorrhea, sore throat, tinnitus, trouble swallowing and voice change.   Eyes: Negative for pain, redness, itching and visual disturbance.       Has advanced macular degeneration and glaucoma  Respiratory: Negative for cough, choking, shortness of breath and wheezing.   Cardiovascular: Negative for chest pain, palpitations and leg swelling.  Gastrointestinal: Negative for nausea, abdominal pain, diarrhea, constipation and abdominal distention.  Endocrine: Negative for cold intolerance, heat intolerance, polydipsia, polyphagia and polyuria.  Genitourinary: Negative for dysuria, urgency, frequency, hematuria, flank pain, vaginal  discharge, difficulty urinating and pelvic pain.  Musculoskeletal: Negative for myalgias, back pain, arthralgias, gait problem, neck pain and neck stiffness.  Skin: Negative.  Negative for color change, pallor and rash.  Allergic/Immunologic: Negative.   Neurological: Negative for dizziness, tremors, seizures, syncope, weakness, numbness and headaches.  Hematological: Negative for adenopathy. Does not bruise/bleed easily.  Psychiatric/Behavioral: Positive for sleep disturbance. Negative for suicidal ideas, hallucinations, behavioral problems, confusion, dysphoric mood, decreased concentration and agitation. The patient is not nervous/anxious and is not hyperactive.     Filed Vitals:   10/07/15 1607  BP: 124/80  Pulse: 78  Temp: 97.9 F (36.6 C)  TempSrc: Oral  Resp: 20  Height: '5\' 4"'  (1.626 m)  Weight: 143 lb 3.2 oz (64.955 kg)  SpO2: 94%  Body mass index is 24.57 kg/(m^2). Filed Weights   10/07/15 1607  Weight: 143 lb 3.2 oz (64.955 kg)     Physical Exam  Constitutional: She is oriented to person, place, and time. She appears well-developed and well-nourished. No distress.  HENT:  Right Ear: External ear normal.  Left Ear: External ear normal.  Nose: Nose normal.  Mouth/Throat: Oropharynx is clear and moist. No oropharyngeal exudate.  Eyes: Conjunctivae and EOM are normal. Pupils are equal, round, and reactive to light. No scleral icterus.  History of glaucoma and severe macular degeneration managed by Dr. Bing Plume  Neck: Normal range of motion. Neck supple. No JVD present. No tracheal deviation present. No thyromegaly present.  Cardiovascular: Normal rate, regular rhythm, normal heart sounds and intact distal pulses.  Exam reveals no gallop and no friction rub.   No murmur heard. Pulmonary/Chest: Effort normal and breath sounds normal. No respiratory distress. She has no wheezes. She has no rales. She exhibits no tenderness.  Abdominal: Soft. Bowel sounds are normal. She  exhibits no distension and no mass. There is no tenderness.  Musculoskeletal: Normal range of motion. She exhibits no edema or tenderness.  History right total knee replacement by Dr. Wynelle Link  Lymphadenopathy:    She has no cervical adenopathy.  Neurological: She is alert and oriented to person, place, and time. No cranial nerve deficit. Coordination normal.  Skin: Skin is warm and dry. No rash noted. She is not diaphoretic. No erythema. No pallor.  Psychiatric: She has a normal mood and affect. Her behavior is normal. Judgment and thought content normal.    Labs reviewed: Lab Summary Latest Ref Rng 10/05/2015 09/23/2014  Hemoglobin 11.1 - 15.9 g/dL 11.5 12.0  Hematocrit 34.0 - 46.6 % 35.3 37.3  White count 3.4 - 10.8 x10E3/uL 5.4 6.0  Platelet count 150 - 379 x10E3/uL 274 301  Sodium 134 - 144 mmol/L 138 137  Potassium 3.5 - 5.2 mmol/L 5.2 4.9  Calcium 8.7 - 10.3 mg/dL 9.2 9.5  Phosphorus - (None) (None)  Creatinine 0.57 - 1.00 mg/dL 0.95 0.97  AST 0 - 40 IU/L 19 20  Alk Phos 39 - 117 IU/L 64 64  Bilirubin 0.0 - 1.2 mg/dL 0.4 0.3  Glucose 65 - 99 mg/dL 93 90  Cholesterol - (None) (None)  HDL cholesterol >39 mg/dL 67 77  Triglycerides 0 - 149 mg/dL 92 107  LDL Direct - (None) (None)  LDL Calc 0 - 99 mg/dL 100(H) 115(H)  Total protein - (None) (None)  Albumin 3.5 - 4.8 g/dL 4.2 4.3   Lab Results  Component Value Date   BUN 18 10/05/2015   10/07/15 EKG: rate 74. NSR. Normal  Assessment/Plan  Chronic insomnia - continue QUEtiapine (SEROQUEL) 25 MG tablet nightly since it has worked so well  Hyperlipidemia - controlled  Macular degeneration - continue see Dr. Bing Plume

## 2015-10-22 DIAGNOSIS — H353134 Nonexudative age-related macular degeneration, bilateral, advanced atrophic with subfoveal involvement: Secondary | ICD-10-CM | POA: Diagnosis not present

## 2015-10-22 DIAGNOSIS — H401133 Primary open-angle glaucoma, bilateral, severe stage: Secondary | ICD-10-CM | POA: Diagnosis not present

## 2015-10-29 DIAGNOSIS — H43813 Vitreous degeneration, bilateral: Secondary | ICD-10-CM | POA: Diagnosis not present

## 2015-10-29 DIAGNOSIS — H353134 Nonexudative age-related macular degeneration, bilateral, advanced atrophic with subfoveal involvement: Secondary | ICD-10-CM | POA: Diagnosis not present

## 2015-10-29 DIAGNOSIS — H4720 Unspecified optic atrophy: Secondary | ICD-10-CM | POA: Diagnosis not present

## 2015-10-29 DIAGNOSIS — H35363 Drusen (degenerative) of macula, bilateral: Secondary | ICD-10-CM | POA: Diagnosis not present

## 2016-02-18 ENCOUNTER — Encounter: Payer: Self-pay | Admitting: Internal Medicine

## 2016-02-25 DIAGNOSIS — H353134 Nonexudative age-related macular degeneration, bilateral, advanced atrophic with subfoveal involvement: Secondary | ICD-10-CM | POA: Diagnosis not present

## 2016-02-25 DIAGNOSIS — H401133 Primary open-angle glaucoma, bilateral, severe stage: Secondary | ICD-10-CM | POA: Diagnosis not present

## 2016-05-05 DIAGNOSIS — M7542 Impingement syndrome of left shoulder: Secondary | ICD-10-CM | POA: Diagnosis not present

## 2016-06-14 ENCOUNTER — Other Ambulatory Visit: Payer: Self-pay | Admitting: Internal Medicine

## 2016-06-30 DIAGNOSIS — H353134 Nonexudative age-related macular degeneration, bilateral, advanced atrophic with subfoveal involvement: Secondary | ICD-10-CM | POA: Diagnosis not present

## 2016-06-30 DIAGNOSIS — H401133 Primary open-angle glaucoma, bilateral, severe stage: Secondary | ICD-10-CM | POA: Diagnosis not present

## 2016-07-14 ENCOUNTER — Other Ambulatory Visit: Payer: Self-pay | Admitting: Internal Medicine

## 2016-07-14 DIAGNOSIS — E785 Hyperlipidemia, unspecified: Secondary | ICD-10-CM

## 2016-07-14 DIAGNOSIS — M1611 Unilateral primary osteoarthritis, right hip: Secondary | ICD-10-CM

## 2016-08-03 DIAGNOSIS — H353134 Nonexudative age-related macular degeneration, bilateral, advanced atrophic with subfoveal involvement: Secondary | ICD-10-CM | POA: Diagnosis not present

## 2016-08-03 DIAGNOSIS — H43813 Vitreous degeneration, bilateral: Secondary | ICD-10-CM | POA: Diagnosis not present

## 2016-08-03 DIAGNOSIS — H35363 Drusen (degenerative) of macula, bilateral: Secondary | ICD-10-CM | POA: Diagnosis not present

## 2016-08-03 DIAGNOSIS — H401133 Primary open-angle glaucoma, bilateral, severe stage: Secondary | ICD-10-CM | POA: Diagnosis not present

## 2016-08-11 DIAGNOSIS — H401133 Primary open-angle glaucoma, bilateral, severe stage: Secondary | ICD-10-CM | POA: Diagnosis not present

## 2016-08-11 DIAGNOSIS — H353134 Nonexudative age-related macular degeneration, bilateral, advanced atrophic with subfoveal involvement: Secondary | ICD-10-CM | POA: Diagnosis not present

## 2016-08-15 ENCOUNTER — Other Ambulatory Visit: Payer: Self-pay | Admitting: Internal Medicine

## 2016-08-15 DIAGNOSIS — F5104 Psychophysiologic insomnia: Secondary | ICD-10-CM

## 2016-09-29 DIAGNOSIS — H353134 Nonexudative age-related macular degeneration, bilateral, advanced atrophic with subfoveal involvement: Secondary | ICD-10-CM | POA: Diagnosis not present

## 2016-09-29 DIAGNOSIS — H401133 Primary open-angle glaucoma, bilateral, severe stage: Secondary | ICD-10-CM | POA: Diagnosis not present

## 2016-10-11 ENCOUNTER — Other Ambulatory Visit: Payer: Medicare Other

## 2016-10-11 ENCOUNTER — Ambulatory Visit (INDEPENDENT_AMBULATORY_CARE_PROVIDER_SITE_OTHER): Payer: Medicare Other

## 2016-10-11 VITALS — BP 100/70 | HR 73 | Temp 97.2°F | Ht 64.0 in | Wt 141.0 lb

## 2016-10-11 DIAGNOSIS — Z0000 Encounter for general adult medical examination without abnormal findings: Secondary | ICD-10-CM

## 2016-10-11 DIAGNOSIS — E785 Hyperlipidemia, unspecified: Secondary | ICD-10-CM | POA: Diagnosis not present

## 2016-10-11 DIAGNOSIS — M1611 Unilateral primary osteoarthritis, right hip: Secondary | ICD-10-CM

## 2016-10-11 LAB — CBC WITH DIFFERENTIAL/PLATELET
BASOS PCT: 1 %
Basophils Absolute: 55 cells/uL (ref 0–200)
Eosinophils Absolute: 110 cells/uL (ref 15–500)
Eosinophils Relative: 2 %
HCT: 38 % (ref 35.0–45.0)
HEMOGLOBIN: 12.4 g/dL (ref 11.7–15.5)
LYMPHS ABS: 1430 {cells}/uL (ref 850–3900)
Lymphocytes Relative: 26 %
MCH: 29.8 pg (ref 27.0–33.0)
MCHC: 32.6 g/dL (ref 32.0–36.0)
MCV: 91.3 fL (ref 80.0–100.0)
MONOS PCT: 9 %
MPV: 9 fL (ref 7.5–12.5)
Monocytes Absolute: 495 cells/uL (ref 200–950)
Neutro Abs: 3410 cells/uL (ref 1500–7800)
Neutrophils Relative %: 62 %
PLATELETS: 291 10*3/uL (ref 140–400)
RBC: 4.16 MIL/uL (ref 3.80–5.10)
RDW: 13.9 % (ref 11.0–15.0)
WBC: 5.5 10*3/uL (ref 3.8–10.8)

## 2016-10-11 LAB — COMPREHENSIVE METABOLIC PANEL
ALBUMIN: 4.2 g/dL (ref 3.6–5.1)
ALT: 14 U/L (ref 6–29)
AST: 21 U/L (ref 10–35)
Alkaline Phosphatase: 48 U/L (ref 33–130)
BILIRUBIN TOTAL: 0.5 mg/dL (ref 0.2–1.2)
BUN: 11 mg/dL (ref 7–25)
CHLORIDE: 101 mmol/L (ref 98–110)
CO2: 25 mmol/L (ref 20–31)
CREATININE: 0.99 mg/dL — AB (ref 0.60–0.93)
Calcium: 9.4 mg/dL (ref 8.6–10.4)
Glucose, Bld: 95 mg/dL (ref 65–99)
Potassium: 4.8 mmol/L (ref 3.5–5.3)
SODIUM: 135 mmol/L (ref 135–146)
TOTAL PROTEIN: 6.5 g/dL (ref 6.1–8.1)

## 2016-10-11 LAB — LIPID PANEL
CHOLESTEROL: 192 mg/dL (ref <200)
HDL: 69 mg/dL (ref >50)
LDL Cholesterol: 107 mg/dL — ABNORMAL HIGH (ref <100)
Total CHOL/HDL Ratio: 2.8 Ratio (ref <5.0)
Triglycerides: 78 mg/dL (ref <150)
VLDL: 16 mg/dL (ref <30)

## 2016-10-11 NOTE — Progress Notes (Signed)
Quick Notes   Health Maintenance: Pt due for mammogram and Pn13. Pt will make own mammogram appt. Mix up with vaccines, could Debbie give it tomorrow? Thanks!   Abnormal Screen:MMSE 27/30. Unable to draw clock as well as sentence and shapes due to glaucoma and macular degeneration   Patient Concerns: none     Nurse Concerns: None I have reviewed the information entered by the Health Advisor. I was present in the office during the time of patient interaction and was available for consultation. I agree with the documentation and advice.  Viviann Spare Nyoka Cowden, MD

## 2016-10-11 NOTE — Progress Notes (Signed)
Subjective:   Meredith Moore is a 81 y.o. female who presents for Medicare Annual (Subsequent) preventive examination.    Objective:     Vitals: BP 100/70 (BP Location: Right Arm, Patient Position: Sitting)   Pulse 73   Temp 97.2 F (36.2 C) (Oral)   Ht 5\' 4"  (1.626 m)   Wt 141 lb (64 kg)   SpO2 98%   BMI 24.20 kg/m   Body mass index is 24.2 kg/m.   Tobacco History  Smoking Status  . Former Smoker  . Packs/day: 0.25  . Years: 15.00  . Quit date: 08/01/1988  Smokeless Tobacco  . Never Used     Counseling given: Not Answered   Past Medical History:  Diagnosis Date  . Arthritis   . Complication of anesthesia    "Blood pressure has gone too low before and effected vision" just with general  . Complication of anesthesia    "woke up in severe panic attack" just with general  . Glaucoma   . Herpes zoster with unspecified complication   . Hip dysplasia    right side  . History of transfusion 2014   2 units, no reaction-after knee replacement  . Hyperlipidemia    under control  . Macular degeneration   . Osteoarthrosis, unspecified whether generalized or localized, unspecified site   . Right hip pain    Past Surgical History:  Procedure Laterality Date  . APPENDECTOMY  1979  . BELPHAROPTOSIS REPAIR    . ovarian tumor   1979   benign tumor removed   . ROBOTIC ASSISTED TOTAL HYSTERECTOMY WITH BILATERAL SALPINGO OOPHERECTOMY Bilateral 03/25/2014   Procedure: ROBOTIC ASSISTED TOTAL HYSTERECTOMY WITH BILATERAL SALPINGO OOPHORECTOMY;  Surgeon: Everitt Amber, MD;  Location: [facility];  Service: Gynecology;  Laterality: Bilateral;  . TONSILLECTOMY     1945   . TOTAL HIP ARTHROPLASTY Right 06/04/2014   Procedure: RIGHT TOTAL HIP ARTHROPLASTY ANTERIOR APPROACH;  Surgeon: Gearlean Alf, MD;  Location: [facility];  Service: Orthopedics;  Laterality: Right;  . TOTAL KNEE ARTHROPLASTY Right 09/10/2012   Procedure: RIGHT TOTAL KNEE ARTHROPLASTY;  Surgeon: Gearlean Alf, MD;   Location: [facility];  Service: Orthopedics;  Laterality: Right;   Family History  Problem Relation Age of Onset  . Heart disease Mother   . Heart disease Father    History  Sexual Activity  . Sexual activity: Not on file    Outpatient Encounter Prescriptions as of 10/11/2016  Medication Sig  . acetaminophen (TYLENOL) 500 MG tablet Take 1,000 mg by mouth every 8 (eight) hours as needed for mild pain or headache.  [name] atorvastatin (LIPITOR) 10 MG tablet TAKE ONE-HALF TABLET BY MOUTH AT BEDTIME FOR  CHOLESTEROL  . Cholecalciferol (VITAMIN D3) 2000 units TABS Take 1 tablet by mouth daily.  [name] latanoprost (XALATAN) 0.005 % ophthalmic solution Place 1 drop into both eyes at bedtime.  . Multiple Minerals-Vitamins (CALCIUM & VIT D3 BONE HEALTH PO) Take by mouth. 1 tablet daily  . Multiple Vitamins-Minerals (EYE-VITE EXTRA PLUS LUTEIN PO) Take by mouth. 1 tablet twice daily  . multivitamin-lutein (OCUVITE-LUTEIN) CAPS capsule Take 1 capsule by mouth daily.  . QUEtiapine (SEROQUEL) 25 MG tablet TAKE 1/2 TABLET BY MOUTH AT BED FOR REST.  . Sennosides (EX-LAX PO) Take 1 tablet by mouth daily as needed (Constipation).   . timolol (TIMOPTIC) 0.5 % ophthalmic solution Place 1 drop into both eyes 2 (two) times daily.   No facility-administered encounter medications on file as of 10/11/2016.  Activities of Daily Living In your present state of health, do you have any difficulty performing the following activities: 10/11/2016  Hearing? N  Vision? Y  Difficulty concentrating or making decisions? N  Walking or climbing stairs? N  Dressing or bathing? N  Doing errands, shopping? N  Preparing Food and eating ? N  Using the Toilet? N  In the past six months, have you accidently leaked urine? N  Do you have problems with loss of bowel control? N  Managing your Medications? N  Managing your Finances? N  Housekeeping or managing your Housekeeping? N  Some recent data might be hidden    Patient Care  Team: Estill Dooms, MD as PCP - General (Internal Medicine) Gaynelle Arabian, MD as Consulting Physician (Orthopedic Surgery) Calvert Cantor, MD as Consulting Physician (Ophthalmology) Hurman Horn, MD as Consulting Physician (Ophthalmology) Larey Seat, MD as Consulting Physician (Neurology)    Assessment:   Exercise Activities and Dietary recommendations Current Exercise Habits: The patient does not participate in regular exercise at present, Exercise limited by: Other - see comments (eye problems)  Goals    . Exercise 3x per week (30 min per time)          Starting 10/11/16 I will start a gym membership and go 3 days a week. I will also enroll at the [employer] to learn how to use a smart phone.      Fall Risk Fall Risk  10/11/2016 10/07/2015 06/16/2015 04/21/2015 09/25/2014  Falls in the past year? No No No No No  Injury with Fall? - - No - -   Depression Screen PHQ 2/9 Scores 10/11/2016 10/07/2015 06/16/2015 09/25/2014  PHQ - 2 Score 0 0 0 0     Cognitive Function MMSE - Mini Mental State Exam 10/11/2016 10/07/2015 09/25/2014 09/18/2013  Not completed: (No Data) (No Data) - -  Orientation to time 5 5 5 5   Orientation to Place 5 5 5 5   Registration 3 3 3 3   Attention/ Calculation 5 5 5 5   Recall 3 3 3 3   Language- name 2 objects 2 - 2 2  Language- repeat 1 1 1 1   Language- follow 3 step command 3 3 3 3   Language- read & follow direction 0 - 0 1  Write a sentence 0 - 0 1  Copy design 0 - 0 1  Total score 27 - 27 30        Immunization History  Administered Date(s) Administered  . Influenza,inj,Quad PF,36+ Mos 04/11/2013, 04/24/2014, 04/21/2015  . Pneumococcal Polysaccharide-23 08/01/2004  . Td 03/22/2011  . Zoster 07/17/2007   Screening Tests Health Maintenance  Topic Date Due  . PNA vac Low Risk Adult (2 of 2 - PCV13) 08/01/2005  . INFLUENZA VACCINE  03/01/2016  . MAMMOGRAM  08/13/2016  . TETANUS/TDAP  03/21/2021  . DEXA SCAN  Completed      Plan:    I  have personally reviewed and addressed the Medicare Annual Wellness questionnaire and have noted the following in the patient's chart:  A. Medical and social history B. Use of alcohol, tobacco or illicit drugs  C. Current medications and supplements D. Functional ability and status E.  Nutritional status F.  Physical activity G. Advance directives H. List of other physicians I.  Hospitalizations, surgeries, and ER visits in previous 12 months J.  [place] to include hearing, vision, cognitive, depression L. Referrals and appointments - none  In addition, I have reviewed and discussed  with patient certain preventive protocols, quality metrics, and best practice recommendations. A written personalized care plan for preventive services as well as general preventive health recommendations were provided to patient.  See attached scanned questionnaire for additional information.   Signed,   Rich Reining, RN Nurse Health Advisor  I have reviewed the information entered by the [facility]. I was present in the office during the time of patient interaction and was available for consultation. I agree with the documentation and advice.  Viviann Spare Nyoka Cowden, MD

## 2016-10-11 NOTE — Patient Instructions (Addendum)
Meredith Moore , Thank you for taking time to come for your Medicare Wellness Visit. I appreciate your ongoing commitment to your health goals. Please review the following plan we discussed and let me know if I can assist you in the future.   These are the goals we discussed: Goals    . Exercise 3x per week (30 min per time)          Starting 10/11/16 I will start a gym membership and go 3 days a week. I will also enroll at the [employer] to learn how to use a smart phone.       This is a list of the screening recommended for you and due dates:  Flu October 2018 Pn23 10/11/2016 Sorry we were out of it today, we will administer next time you are here Mammogram 10/11/2016 Pt will make appointment  Dr Nyoka Cowden will see you 10/12/16 at 11 am. Thanks!   Preventive Care 43 Years and Older, Female Preventive care refers to lifestyle choices and visits with your health care provider that can promote health and wellness. What does preventive care include?  A yearly physical exam. This is also called an annual well check.  Dental exams once or twice a year.  Routine eye exams. Ask your health care provider how often you should have your eyes checked.  Personal lifestyle choices, including:  Daily care of your teeth and gums.  Regular physical activity.  Eating a healthy diet.  Avoiding tobacco and drug use.  Limiting alcohol use.  Practicing safe sex.  Taking low-dose aspirin every day.  Taking vitamin and mineral supplements as recommended by your health care provider. What happens during an annual well check? The services and screenings done by your health care provider during your annual well check will depend on your age, overall health, lifestyle risk factors, and family history of disease. Counseling  Your health care provider may ask you questions about your:  Alcohol use.  Tobacco use.  Drug use.  Emotional well-being.  Home and relationship well-being.  Sexual  activity.  Eating habits.  History of falls.  Memory and ability to understand (cognition).  Work and work Statistician.  Reproductive health. Screening  You may have the following tests or measurements:  Height, weight, and BMI.  Blood pressure.  Lipid and cholesterol levels. These may be checked every 5 years, or more frequently if you are over 1 years old.  Skin check.  Lung cancer screening. You may have this screening every year starting at age 23 if you have a 30-pack-year history of smoking and currently smoke or have quit within the past 15 years.  Fecal occult blood test (FOBT) of the stool. You may have this test every year starting at age 57.  Flexible sigmoidoscopy or colonoscopy. You may have a sigmoidoscopy every 5 years or a colonoscopy every 10 years starting at age 20.  Hepatitis C blood test.  Hepatitis B blood test.  Sexually transmitted disease (STD) testing.  Diabetes screening. This is done by checking your blood sugar (glucose) after you have not eaten for a while (fasting). You may have this done every 1-3 years.  Bone density scan. This is done to screen for osteoporosis. You may have this done starting at age 57.  Mammogram. This may be done every 1-2 years. Talk to your health care provider about how often you should have regular mammograms. Talk with your health care provider about your test results, treatment options, and if  necessary, the need for more tests. Vaccines  Your health care provider may recommend certain vaccines, such as:  Influenza vaccine. This is recommended every year.  Tetanus, diphtheria, and acellular pertussis (Tdap, Td) vaccine. You may need a Td booster every 10 years.  Varicella vaccine. You may need this if you have not been vaccinated.  Zoster vaccine. You may need this after age 20.  Measles, mumps, and rubella (MMR) vaccine. You may need at least one dose of MMR if you were born in 1957 or later. You may also  need a second dose.  Pneumococcal 13-valent conjugate (PCV13) vaccine. One dose is recommended after age 63.  Pneumococcal polysaccharide (PPSV23) vaccine. One dose is recommended after age 32.  Meningococcal vaccine. You may need this if you have certain conditions.  Hepatitis A vaccine. You may need this if you have certain conditions or if you travel or work in places where you may be exposed to hepatitis A.  Hepatitis B vaccine. You may need this if you have certain conditions or if you travel or work in places where you may be exposed to hepatitis B.  Haemophilus influenzae type b (Hib) vaccine. You may need this if you have certain conditions. Talk to your health care provider about which screenings and vaccines you need and how often you need them. This information is not intended to replace advice given to you by your health care provider. Make sure you discuss any questions you have with your health care provider. Document Released: 08/14/2015 Document Revised: 04/06/2016 Document Reviewed: 05/19/2015 Elsevier Interactive Patient Education  2017 [employer].

## 2016-10-12 ENCOUNTER — Encounter: Payer: Self-pay | Admitting: Internal Medicine

## 2016-10-12 ENCOUNTER — Ambulatory Visit (INDEPENDENT_AMBULATORY_CARE_PROVIDER_SITE_OTHER): Payer: Medicare Other | Admitting: Internal Medicine

## 2016-10-12 VITALS — BP 120/74 | HR 61 | Temp 97.4°F | Ht 64.0 in | Wt 142.0 lb

## 2016-10-12 DIAGNOSIS — Z79899 Other long term (current) drug therapy: Secondary | ICD-10-CM | POA: Insufficient documentation

## 2016-10-12 DIAGNOSIS — H3530 Unspecified macular degeneration: Secondary | ICD-10-CM

## 2016-10-12 DIAGNOSIS — Z23 Encounter for immunization: Secondary | ICD-10-CM | POA: Diagnosis not present

## 2016-10-12 DIAGNOSIS — G4700 Insomnia, unspecified: Secondary | ICD-10-CM

## 2016-10-12 DIAGNOSIS — H547 Unspecified visual loss: Secondary | ICD-10-CM | POA: Diagnosis not present

## 2016-10-12 DIAGNOSIS — H401133 Primary open-angle glaucoma, bilateral, severe stage: Secondary | ICD-10-CM | POA: Diagnosis not present

## 2016-10-12 DIAGNOSIS — E785 Hyperlipidemia, unspecified: Secondary | ICD-10-CM | POA: Diagnosis not present

## 2016-10-12 DIAGNOSIS — Z0000 Encounter for general adult medical examination without abnormal findings: Secondary | ICD-10-CM | POA: Diagnosis not present

## 2016-10-12 HISTORY — DX: Unspecified visual loss: H54.7

## 2016-10-12 NOTE — Progress Notes (Signed)
HISTORY AND PHYSICAL  Location:    [facility]   Place of Service:   OFFICE  Extended Emergency Contact Information Primary Emergency Contact: Schneider,Charles Address: [address]          [place], [place] [address] [place] Phone: [phone] Mobile Phone: [phone] Relation: Spouse  Advanced Directive information Does Patient Have a Medical Advance Directive?: Yes, Type of Advance Directive: Healthcare Power of [place];Living will  Chief Complaint  Patient presents with  . Annual Exam    Complete physical    HPI:  Feeling well.No specific concerns. Needs Pre[place].  Primary open angle glaucoma of both eyes, severe stage - legally blind  Macular degeneration - legally blind  Hyperlipidemia, unspecified hyperlipidemia type - controlled  Blind - legally blind    Past Medical History:  Diagnosis Date  . Arthritis   . Complication of anesthesia    "Blood pressure has gone too low before and effected vision" just with general  . Complication of anesthesia    "woke up in severe panic attack" just with general  . Glaucoma   . Herpes zoster with unspecified complication   . Hip dysplasia    right side  . History of transfusion 2014   2 units, no reaction-after knee replacement  . Hyperlipidemia    under control  . Macular degeneration   . Osteoarthrosis, unspecified whether generalized or localized, unspecified site   . Right hip pain     Past Surgical History:  Procedure Laterality Date  . APPENDECTOMY  1979  . BELPHAROPTOSIS REPAIR    . ovarian tumor   1979   benign tumor removed   . ROBOTIC ASSISTED TOTAL HYSTERECTOMY WITH BILATERAL SALPINGO OOPHERECTOMY Bilateral 03/25/2014   Procedure: ROBOTIC ASSISTED TOTAL HYSTERECTOMY WITH BILATERAL SALPINGO OOPHORECTOMY;  Surgeon: Everitt Amber, MD;  Location: [facility];  Service: Gynecology;  Laterality: Bilateral;  . TONSILLECTOMY     1945   . TOTAL HIP ARTHROPLASTY Right 06/04/2014   Procedure:  RIGHT TOTAL HIP ARTHROPLASTY ANTERIOR APPROACH;  Surgeon: Gearlean Alf, MD;  Location: [facility];  Service: Orthopedics;  Laterality: Right;  . TOTAL KNEE ARTHROPLASTY Right 09/10/2012   Procedure: RIGHT TOTAL KNEE ARTHROPLASTY;  Surgeon: Gearlean Alf, MD;  Location: [facility];  Service: Orthopedics;  Laterality: Right;    Patient Care Team: Estill Dooms, MD as PCP - General (Internal Medicine) Gaynelle Arabian, MD as Consulting Physician (Orthopedic Surgery) Calvert Cantor, MD as Consulting Physician (Ophthalmology) Hurman Horn, MD as Consulting Physician (Ophthalmology) Larey Seat, MD as Consulting Physician (Neurology)  Social History   Social History  . Marital status: Married    Spouse name: N/A  . Number of children: N/A  . Years of education: N/A   Occupational History  . Not on file.   Social History Main Topics  . Smoking status: Former Smoker    Packs/day: 0.25    Years: 15.00    Quit date: 08/01/1988  . Smokeless tobacco: Never Used  . Alcohol use No  . Drug use: No  . Sexual activity: Not on file   Other Topics Concern  . Not on file   Social History Narrative   Married   Former smoker 1990   Alcohol none   Exercise walk daily about one mile, gym       reports that she quit smoking about 28 years ago. She has a 3.75 pack-year smoking history. She has never used smokeless tobacco. She reports that she does  not drink alcohol or use drugs.  Family History  Problem Relation Age of Onset  . Heart disease Mother   . Heart disease Father    Family Status  Relation Status  . Mother Deceased at age 5  . Father Deceased at age 42  . Brother Alive  . Daughter Alive  . Son Alive  . Daughter Alive    Immunization History  Administered Date(s) Administered  . Influenza,inj,Quad PF,36+ Mos 04/11/2013, 04/24/2014, 04/21/2015  . Pneumococcal Polysaccharide-23 08/01/2004  . Td 03/22/2011  . Zoster 07/17/2007    Allergies  Allergen Reactions  .  Tramadol     Fine when taking it- did not sleep for 6 weeks after stopping medication  . Formaldehyde Rash  . Nickel Rash    Medications: Patient's Medications  New Prescriptions   No medications on file  Previous Medications   ACETAMINOPHEN (TYLENOL) 500 MG TABLET    Take 1,000 mg by mouth every 8 (eight) hours as needed for mild pain or headache.   ATORVASTATIN (LIPITOR) 10 MG TABLET    TAKE ONE-HALF TABLET BY MOUTH AT BEDTIME FOR  CHOLESTEROL   CHOLECALCIFEROL (VITAMIN D3) 2000 UNITS TABS    Take 1 tablet by mouth daily.   LATANOPROST (XALATAN) 0.005 % OPHTHALMIC SOLUTION    Place 1 drop into both eyes at bedtime.   MULTIPLE MINERALS-VITAMINS (CALCIUM & VIT D3 BONE HEALTH PO)    Take by mouth. 1 tablet daily   MULTIPLE VITAMINS-MINERALS (EYE-VITE EXTRA PLUS LUTEIN PO)    Take by mouth. 1 tablet twice daily   MULTIVITAMIN-LUTEIN (OCUVITE-LUTEIN) CAPS CAPSULE    Take 1 capsule by mouth daily.   QUETIAPINE (SEROQUEL) 25 MG TABLET    TAKE 1/2 TABLET BY MOUTH AT BED FOR REST.   SENNOSIDES (EX-LAX PO)    Take 1 tablet by mouth daily as needed (Constipation).    TIMOLOL (TIMOPTIC) 0.5 % OPHTHALMIC SOLUTION    Place 1 drop into both eyes 2 (two) times daily.  Modified Medications   No medications on file  Discontinued Medications   No medications on file    Review of Systems  Constitutional: Negative for activity change, appetite change, chills, diaphoresis, fatigue, fever and unexpected weight change.  HENT: Negative for congestion, ear discharge, ear pain, hearing loss, postnasal drip, rhinorrhea, sore throat, tinnitus, trouble swallowing and voice change.   Eyes: Negative for pain, redness, itching and visual disturbance.       Has advanced macular degeneration and glaucoma  Respiratory: Negative for cough, choking, shortness of breath and wheezing.   Cardiovascular: Negative for chest pain, palpitations and leg swelling.  Gastrointestinal: Negative for abdominal distention, abdominal  pain, constipation, diarrhea and nausea.  Endocrine: Negative for cold intolerance, heat intolerance, polydipsia, polyphagia and polyuria.  Genitourinary: Negative for difficulty urinating, dysuria, flank pain, frequency, hematuria, pelvic pain, urgency and vaginal discharge.  Musculoskeletal: Negative for arthralgias, back pain, gait problem, myalgias, neck pain and neck stiffness.  Skin: Negative.  Negative for color change, pallor and rash.  Allergic/Immunologic: Negative.   Neurological: Negative for dizziness, tremors, seizures, syncope, weakness, numbness and headaches.  Hematological: Negative for adenopathy. Does not bruise/bleed easily.  Psychiatric/Behavioral: Positive for sleep disturbance. Negative for agitation, behavioral problems, confusion, decreased concentration, dysphoric mood, hallucinations and suicidal ideas. The patient is not nervous/anxious and is not hyperactive.     Vitals:   10/12/16 1117  BP: 120/74  Pulse: 61  Temp: 97.4 F (36.3 C)  TempSrc: Oral  Weight: 142 lb (64.4  kg)  Height: '5\' 4"'  (1.626 m)   Body mass index is 24.37 kg/m. Filed Weights   10/12/16 1117  Weight: 142 lb (64.4 kg)     Physical Exam  Constitutional: She is oriented to person, place, and time. She appears well-developed and well-nourished. No distress.  HENT:  Right Ear: External ear normal.  Left Ear: External ear normal.  Nose: Nose normal.  Mouth/Throat: Oropharynx is clear and moist. No oropharyngeal exudate.  Eyes: Conjunctivae and EOM are normal. Pupils are equal, round, and reactive to light. No scleral icterus.  History of glaucoma and severe macular degeneration managed by Dr. Bing Plume  Neck: Normal range of motion. Neck supple. No JVD present. No tracheal deviation present. No thyromegaly present.  Cardiovascular: Normal rate, regular rhythm, normal heart sounds and intact distal pulses.  Exam reveals no gallop and no friction rub.   No murmur heard. Pulmonary/Chest:  Effort normal and breath sounds normal. No respiratory distress. She has no wheezes. She has no rales. She exhibits no tenderness.  Abdominal: Soft. Bowel sounds are normal. She exhibits no distension and no mass. There is no tenderness.  Musculoskeletal: Normal range of motion. She exhibits no edema or tenderness.  History right total knee replacement by Dr. Wynelle Link  Lymphadenopathy:    She has no cervical adenopathy.  Neurological: She is alert and oriented to person, place, and time. No cranial nerve deficit. Coordination normal.  10/12/16 MMSE 27/30. Unable to complete drawing tasks due to severe visual impairment. This is sa normal MMSE otherwise.  Skin: Skin is warm and dry. No rash noted. She is not diaphoretic. No erythema. No pallor.  Psychiatric: She has a normal mood and affect. Her behavior is normal. Judgment and thought content normal.    Labs reviewed: Lab Summary Latest Ref Rng & Units 10/11/2016 10/05/2015  Hemoglobin 11.7 - 15.5 g/dL 12.4 11.5  Hematocrit 35.0 - 45.0 % 38.0 35.3  White count 3.8 - 10.8 K/uL 5.5 5.4  Platelet count 140 - 400 K/uL 291 274  Sodium 135 - 146 mmol/L 135 138  Potassium 3.5 - 5.3 mmol/L 4.8 5.2  Calcium 8.6 - 10.4 mg/dL 9.4 9.2  Phosphorus - (None) (None)  Creatinine 0.60 - 0.93 mg/dL 0.99(H) 0.95  AST 10 - 35 U/L 21 19  Alk Phos 33 - 130 U/L 48 64  Bilirubin 0.2 - 1.2 mg/dL 0.5 0.4  Glucose 65 - 99 mg/dL 95 93  Cholesterol <200 mg/dL 192 (None)  HDL cholesterol >50 mg/dL 69 67  Triglycerides <150 mg/dL 78 92  LDL Direct - (None) (None)  LDL Calc <100 mg/dL 107(H) 100(H)  Total protein 6.1 - 8.1 g/dL 6.5 (None)  Albumin 3.6 - 5.1 g/dL 4.2 4.2  Some recent data might be hidden   Lab Results  Component Value Date   BUN 11 10/11/2016    Assessment/Plan  1. Primary open angle glaucoma of both eyes, severe stage See Dr. Bing Plume and associates  2. Macular degeneration See Dr. Bing Plume and associates  3. Hyperlipidemia, unspecified  hyperlipidemia type The current medical regimen is effective;  continue present plan and medications. - Lipid panel; Future  4. Blind See Dr. Bing Plume and associates  5. Insomnia, unspecified type Continue Seroquel. Originally prescribed by Dr. Brett Fairy  6. Medication management - Comprehensive metabolic panel; Future

## 2016-10-12 NOTE — Addendum Note (Signed)
Addended by: Logan Bores on: 10/12/2016 12:29 PM   Modules accepted: Orders

## 2016-10-29 IMAGING — CR DG PORTABLE PELVIS
1 series · 1 of 1 positions shown · non-contrast
Comparison: None.

CLINICAL DATA: Status post arthroplasty

EXAM:
DG C-ARM 1-60 MIN - [facility]; PORTABLE PELVIS 1-2 VIEWS

[AP]
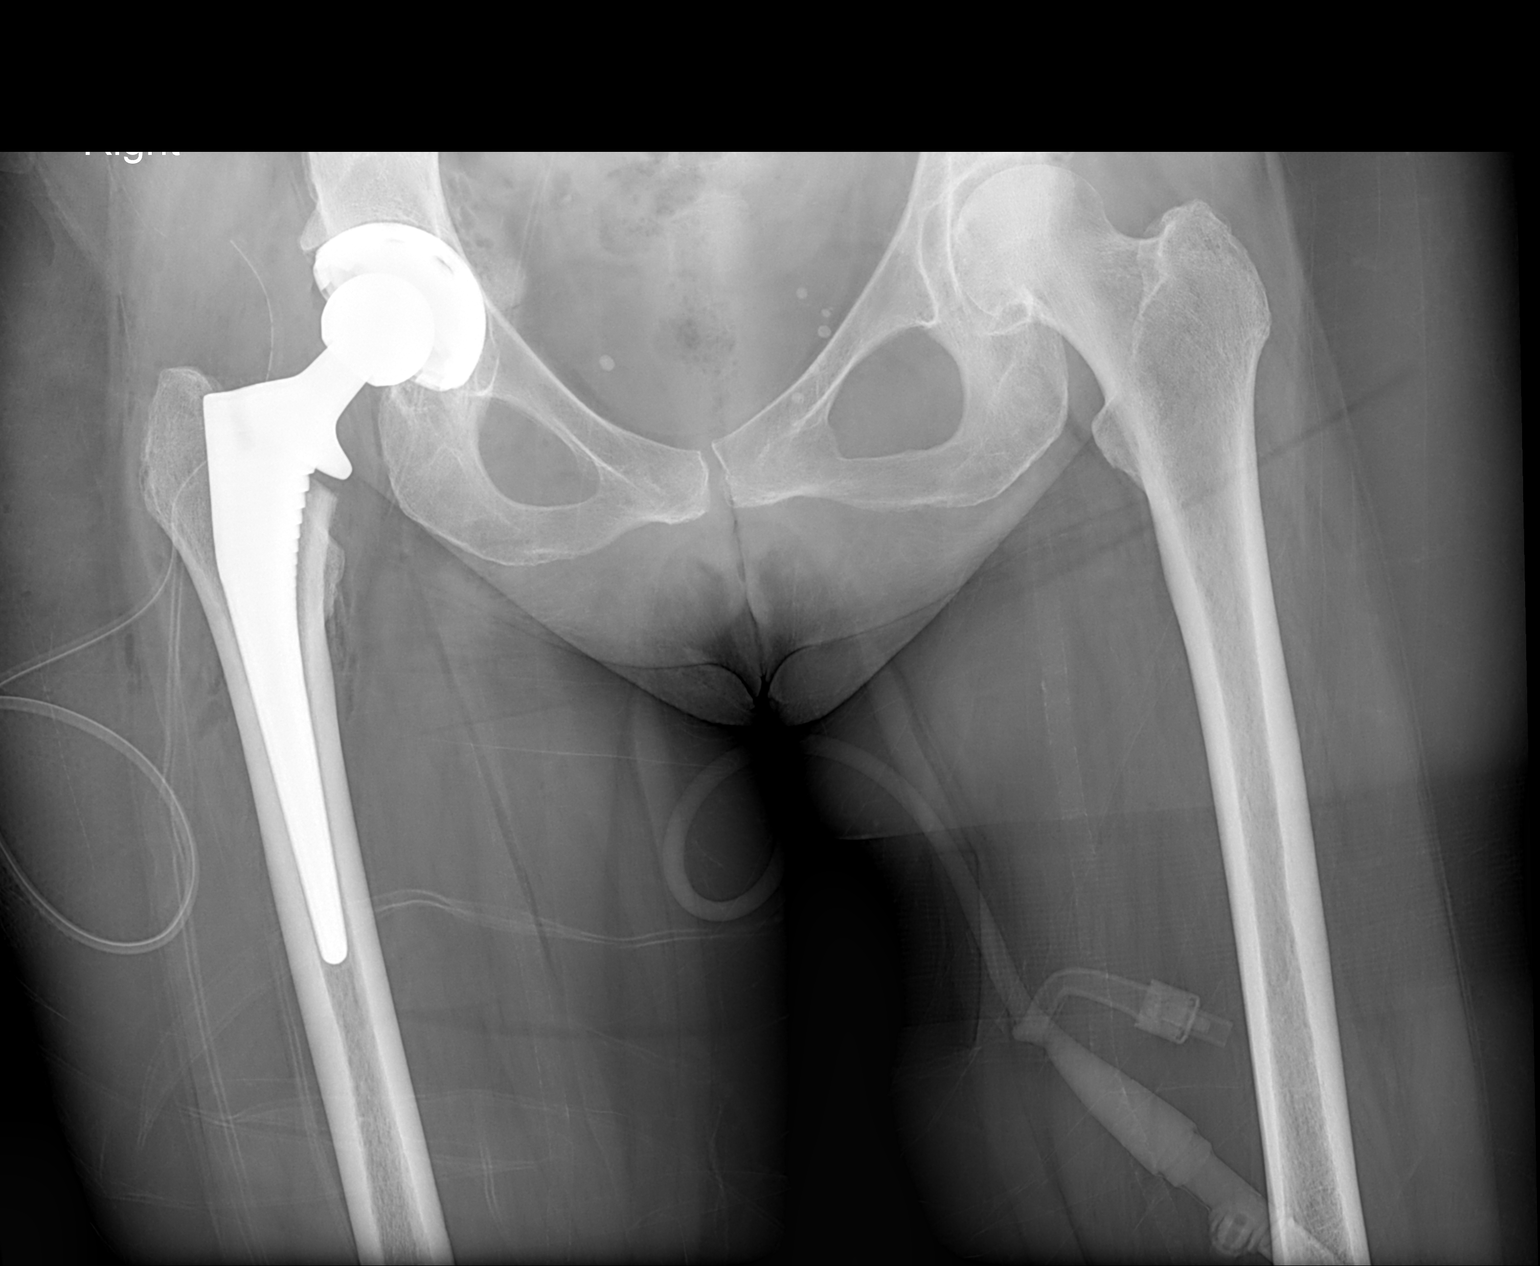

[1 of 1 positions shown; findings below may reference images not displayed]

FINDINGS: There is a total hip prosthesis on the right which appears well
seated. No fracture or dislocation is seen on this single view.
There is a drain in the superior aspect of the right hip joint.
There is mild narrowing of the left hip joint.
IMPRESSION: Prosthesis on the right appears well-seated. No fracture or
dislocation.

## 2016-11-10 DIAGNOSIS — H401133 Primary open-angle glaucoma, bilateral, severe stage: Secondary | ICD-10-CM | POA: Diagnosis not present

## 2016-11-23 ENCOUNTER — Telehealth: Payer: Self-pay | Admitting: *Deleted

## 2016-11-23 MED ORDER — MECLIZINE HCL 12.5 MG PO TABS: ORAL_TABLET | ORAL | 5 refills | Status: AC

## 2016-11-23 NOTE — Telephone Encounter (Signed)
Patient woke up this morning with some dizziness, stated that she has had this before and Jessica prescribed Meclizine 12.5. Her medication is old and out of date and she is requesting a new Rx to be faxed to her pharmacy. Please Advise.

## 2016-11-23 NOTE — Telephone Encounter (Signed)
Send prescription to pharmacy for meclizine 12.5 mg (30) one up to 4 times daily as needed for vertigo. 5 refills.

## 2016-11-23 NOTE — Telephone Encounter (Signed)
Medication list updated and Rx faxed to pharmacy. Patient notified.  

## 2016-12-15 DIAGNOSIS — H401133 Primary open-angle glaucoma, bilateral, severe stage: Secondary | ICD-10-CM | POA: Diagnosis not present

## 2016-12-23 DIAGNOSIS — M25552 Pain in left hip: Secondary | ICD-10-CM | POA: Diagnosis not present

## 2016-12-27 DIAGNOSIS — R1032 Left lower quadrant pain: Secondary | ICD-10-CM | POA: Diagnosis not present

## 2016-12-29 DIAGNOSIS — R1032 Left lower quadrant pain: Secondary | ICD-10-CM | POA: Diagnosis not present

## 2017-01-05 DIAGNOSIS — M25552 Pain in left hip: Secondary | ICD-10-CM | POA: Diagnosis not present

## 2017-01-12 DIAGNOSIS — M25552 Pain in left hip: Secondary | ICD-10-CM | POA: Diagnosis not present

## 2017-01-19 DIAGNOSIS — H401133 Primary open-angle glaucoma, bilateral, severe stage: Secondary | ICD-10-CM | POA: Diagnosis not present

## 2017-01-19 DIAGNOSIS — H353134 Nonexudative age-related macular degeneration, bilateral, advanced atrophic with subfoveal involvement: Secondary | ICD-10-CM | POA: Diagnosis not present

## 2017-01-23 DIAGNOSIS — Z1231 Encounter for screening mammogram for malignant neoplasm of breast: Secondary | ICD-10-CM | POA: Diagnosis not present

## 2017-01-23 LAB — HM MAMMOGRAPHY

## 2017-02-09 DIAGNOSIS — M1612 Unilateral primary osteoarthritis, left hip: Secondary | ICD-10-CM | POA: Diagnosis not present

## 2017-02-09 DIAGNOSIS — M25552 Pain in left hip: Secondary | ICD-10-CM | POA: Diagnosis not present

## 2017-02-24 ENCOUNTER — Encounter: Payer: Self-pay | Admitting: *Deleted

## 2017-03-09 ENCOUNTER — Other Ambulatory Visit: Payer: Self-pay | Admitting: Internal Medicine

## 2017-04-04 ENCOUNTER — Telehealth: Payer: Self-pay | Admitting: *Deleted

## 2017-04-04 NOTE — Telephone Encounter (Signed)
Patient called and stated that she is almost out of her Quetiapine. Stated that she has to cut the pills in half and take at bedtime for rest and sometimes they don't cut just right and sometimes she has to take 2 halves. Patient is almost out of them and the pharmacy told her she was not due for a refill until 04/22/17. Patient is wondering if you could call in at least 10 to get her through until then. Please Advise.

## 2017-04-05 NOTE — Telephone Encounter (Signed)
Patient already picked up her full Rx.

## 2017-04-05 NOTE — Telephone Encounter (Signed)
Ok to dispense 10 pills of quetiapine.  Would suggest you ask pharmacy to cut them for her if possible.

## 2017-04-13 ENCOUNTER — Ambulatory Visit (INDEPENDENT_AMBULATORY_CARE_PROVIDER_SITE_OTHER): Payer: Medicare Other | Admitting: Nurse Practitioner

## 2017-04-13 ENCOUNTER — Encounter: Payer: Self-pay | Admitting: Nurse Practitioner

## 2017-04-13 VITALS — BP 118/72 | HR 98 | Temp 98.1°F | Resp 18 | Ht 64.0 in | Wt 134.6 lb

## 2017-04-13 DIAGNOSIS — F5104 Psychophysiologic insomnia: Secondary | ICD-10-CM | POA: Diagnosis not present

## 2017-04-13 DIAGNOSIS — Z23 Encounter for immunization: Secondary | ICD-10-CM | POA: Diagnosis not present

## 2017-04-13 DIAGNOSIS — M1990 Unspecified osteoarthritis, unspecified site: Secondary | ICD-10-CM | POA: Diagnosis not present

## 2017-04-13 DIAGNOSIS — Z01818 Encounter for other preprocedural examination: Secondary | ICD-10-CM

## 2017-04-13 DIAGNOSIS — M810 Age-related osteoporosis without current pathological fracture: Secondary | ICD-10-CM

## 2017-04-13 DIAGNOSIS — D649 Anemia, unspecified: Secondary | ICD-10-CM | POA: Diagnosis not present

## 2017-04-13 MED ORDER — QUETIAPINE FUMARATE 25 MG PO TABS: ORAL_TABLET | ORAL | 1 refills | Status: DC

## 2017-04-13 MED ORDER — ZOSTER VAC RECOMB ADJUVANTED 50 MCG/0.5ML IM SUSR: 0.5000 mL | Freq: Once | INTRAMUSCULAR | 1 refills | Status: AC

## 2017-04-13 NOTE — Progress Notes (Signed)
Careteam: Patient Care Team: Lauree Chandler, NP as PCP - General (Geriatric Medicine) Gaynelle Arabian, MD as Consulting Physician (Orthopedic Surgery) Calvert Cantor, MD as Consulting Physician (Ophthalmology) Zadie Rhine Clent Demark, MD as Consulting Physician (Ophthalmology) Dohmeier, Asencion Partridge, MD as Consulting Physician (Neurology)  Advanced Directive information Does Patient Have a Medical Advance Directive?: Yes, Type of Advance Directive: [place];Living will  Allergies  Allergen Reactions  . Tramadol     Fine when taking it- did not sleep for 6 weeks after stopping medication  . Formaldehyde Rash  . Nickel Rash    Chief Complaint  Patient presents with  . Medical Management of Chronic Issues    Pt is being seen for a 6 month routine visit and for surgical clearance.      HPI: Patient is a 81 y.o. female seen in the office today for routine follow up and surgical clearance.  Planning on having left hip replacement.  Has already had right hip replacement and right knee, tolerated surgeries well. Very motivated and diligent with therapy.  No shortness of breath, chest pain  Insomnia- prescription for Seroquel given for 50 mg 1/2 tablet at bedtime however pills do not cut into 2 even halve when she has a smaller half she usually takes 2 which helps her sleep.   Still following with ophthalmology due to glaucoma and macular degeneration.  At this point has lost all color. Can see large objects and some smaller objects Just graduated and able to read braille Still able to cook and get around her house.   Review of Systems:  Review of Systems  Constitutional: Negative for chills, fever and weight loss.  HENT: Negative for tinnitus.   Eyes:       Legally blind   Respiratory: Negative for cough, sputum production and shortness of breath.   Cardiovascular: Negative for chest pain, palpitations and leg swelling.  Gastrointestinal: Negative for abdominal  pain, constipation, diarrhea and heartburn.  Genitourinary: Negative for dysuria, frequency and urgency.  Musculoskeletal: Negative for back pain, falls, joint pain and myalgias.  Skin: Negative.   Neurological: Negative for dizziness and headaches.  Psychiatric/Behavioral: Negative for depression and memory loss. The patient does not have insomnia.     Past Medical History:  Diagnosis Date  . Arthritis   . 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 Complication of anesthesia    "Blood pressure has gone too low before and effected vision" just with general  . Complication of anesthesia    "woke up in severe panic attack" just with general  . Glaucoma   . Herpes zoster with unspecified complication   . Hip dysplasia    right side  . History of transfusion 2014   2 units, no reaction-after knee replacement  . Hyperlipidemia    under control  . Macular degeneration   . Osteoarthrosis, unspecified whether generalized or localized, unspecified site   . Right hip pain    Past Surgical History:  Procedure Laterality Date  . APPENDECTOMY  1979  . BELPHAROPTOSIS REPAIR    . ovarian tumor   1979   benign tumor removed   . ROBOTIC ASSISTED TOTAL HYSTERECTOMY WITH BILATERAL SALPINGO OOPHERECTOMY Bilateral 03/25/2014   Procedure: ROBOTIC ASSISTED TOTAL HYSTERECTOMY WITH BILATERAL SALPINGO OOPHORECTOMY;  Surgeon: Everitt Amber, MD;  Location: [facility];  Service: Gynecology;  Laterality: Bilateral;  . TONSILLECTOMY     1945   . TOTAL HIP ARTHROPLASTY Right 06/04/2014   Procedure: RIGHT TOTAL  HIP ARTHROPLASTY ANTERIOR APPROACH;  Surgeon: Gearlean Alf, MD;  Location: [facility];  Service: Orthopedics;  Laterality: Right;  . TOTAL KNEE ARTHROPLASTY Right 09/10/2012   Procedure: RIGHT TOTAL KNEE ARTHROPLASTY;  Surgeon: Gearlean Alf, MD;  Location: [facility];  Service: Orthopedics;  Laterality: Right;   Social History:   reports that she quit smoking about 28 years ago. She has a 3.75 pack-year  smoking history. She has never used smokeless tobacco. She reports that she does not drink alcohol or use drugs.  Family History  Problem Relation Age of Onset  . Heart disease Mother   . Heart disease Father     Medications: Patient's Medications  New Prescriptions   No medications on file  Previous Medications   ATORVASTATIN (LIPITOR) 10 MG TABLET    TAKE ONE-HALF TABLET BY MOUTH AT BEDTIME FOR CHOLESTEROL   CHOLECALCIFEROL (VITAMIN D3) 2000 UNITS TABS    Take 1 tablet by mouth daily.   LATANOPROST (XALATAN) 0.005 % OPHTHALMIC SOLUTION    Place 1 drop into both eyes at bedtime.   MECLIZINE (ANTIVERT) 12.5 MG TABLET    Take one tablet by mouth up to four times daily as needed for vertigo   MULTIPLE MINERALS-VITAMINS (CALCIUM & VIT D3 BONE HEALTH PO)    Take by mouth. 1 tablet daily   MULTIPLE VITAMINS-MINERALS (PRESERVISION AREDS 2+MULTI VIT) CAPS    Take 1 tablet by mouth 2 (two) times daily.   NAPROXEN SODIUM (ANAPROX) 220 MG TABLET    Take 220 mg by mouth 2 (two) times daily with a meal.   QUETIAPINE (SEROQUEL) 25 MG TABLET    TAKE 1/2 TABLET BY MOUTH AT BED FOR REST.   SENNOSIDES (EX-LAX PO)    Take 1 tablet by mouth daily as needed (Constipation).    SIMBRINZA 1-0.2 % SUSP    Place 1 drop into both eyes 2 (two) times daily.   TIMOLOL (TIMOPTIC) 0.5 % OPHTHALMIC SOLUTION    Place 1 drop into both eyes 2 (two) times daily.  Modified Medications   No medications on file  Discontinued Medications   ACETAMINOPHEN (TYLENOL) 500 MG TABLET    Take 1,000 mg by mouth every 8 (eight) hours as needed for mild pain or headache.   MULTIPLE VITAMINS-MINERALS (EYE-VITE EXTRA PLUS LUTEIN PO)    Take by mouth. 1 tablet twice daily   MULTIVITAMIN-LUTEIN (OCUVITE-LUTEIN) CAPS CAPSULE    Take 1 capsule by mouth daily.     Physical Exam:  Vitals:   04/13/17 1407  BP: 118/72  Pulse: 98  Resp: 18  Temp: 98.1 F (36.7 C)  TempSrc: Oral  SpO2: 98%  Weight: 134 lb 9.6 oz (61.1 kg)  Height: 5'  4" (1.626 m)   Body mass index is 23.1 kg/m.  Physical Exam  Constitutional: She is oriented to person, place, and time. She appears well-developed and well-nourished. No distress.  HENT:  Right Ear: External ear normal.  Left Ear: External ear normal.  Nose: Nose normal.  Mouth/Throat: Oropharynx is clear and moist. No oropharyngeal exudate.  Eyes: Pupils are equal, round, and reactive to light. Conjunctivae and EOM are normal. No scleral icterus.  History of glaucoma and severe macular degeneration managed by Dr. Bing Plume  Neck: Normal range of motion. Neck supple. No JVD present. No tracheal deviation present. No thyromegaly present.  Cardiovascular: Normal rate, regular rhythm and normal heart sounds.  Exam reveals no gallop and no friction rub.   No murmur heard. Pulmonary/Chest: Effort normal  and breath sounds normal. No respiratory distress. She has no wheezes. She has no rales. She exhibits no tenderness.  Abdominal: Soft. Bowel sounds are normal. She exhibits no distension and no mass. There is no tenderness.  Musculoskeletal: Normal range of motion. She exhibits no edema or tenderness.  History right total knee and total hip replacement by Dr. Wynelle Link   Lymphadenopathy:    She has no cervical adenopathy.  Neurological: She is alert and oriented to person, place, and time. No cranial nerve deficit. Coordination normal.  10/12/16 MMSE 27/30. Unable to complete drawing tasks due to severe visual impairment. This is sa normal MMSE otherwise.  Skin: Skin is warm and dry. No rash noted. She is not diaphoretic. No erythema. No pallor.  Psychiatric: She has a normal mood and affect. Her behavior is normal. Judgment and thought content normal.    Labs reviewed: Basic Metabolic Panel:  Recent Labs  10/11/16 1108  NA 135  K 4.8  CL 101  CO2 25  GLUCOSE 95  BUN 11  CREATININE 0.99*  CALCIUM 9.4   Liver Function Tests:  Recent Labs  10/11/16 1108  AST 21  ALT 14  ALKPHOS  48  BILITOT 0.5  PROT 6.5  ALBUMIN 4.2   No results for input(s): LIPASE, AMYLASE in the last 8760 hours. No results for input(s): AMMONIA in the last 8760 hours. CBC:  Recent Labs  10/11/16 1108  WBC 5.5  NEUTROABS 3,410  HGB 12.4  HCT 38.0  MCV 91.3  PLT 291   Lipid Panel:  Recent Labs  10/11/16 1108  CHOL 192  HDL 69  LDLCALC 107*  TRIG 78  CHOLHDL 2.8   TSH: No results for input(s): TSH in the last 8760 hours. A1C: No results found for: HGBA1C   Assessment/Plan 1. Preop examination - based on the Revised Cardiac Risk Index for Pre-Operative Risk she is at 0.4% for major cardiac event.  -pt denies any cardiac or pulmonary symptoms. - EKG 12-Lead - CMP with eGFR - CBC with Differential/Platelets  2. Need for immunization against influenza - Flu vaccine HIGH DOSE PF (Fluzone High dose)  3. Chronic insomnia -controlled on seroquel. Will ask pharmacy to 1/2 tablets - QUEtiapine (SEROQUEL) 25 MG tablet; 1/2-1 tablet at night as needed for rest  Dispense: 90 tablet; Refill: 1  4. Osteoporosis, unspecified osteoporosis type, unspecified pathological fracture presence -not currently on any therapy, will get prior auth for prolia.  - DG Bone Density; Future  5. Osteoarthritis, unspecified osteoarthritis type, unspecified site End stage OA of left hip  Next appt: 6 months.  Carlos American. Harle Battiest  [facility] [phone] 8 am - 5 pm) [phone] (after hours)

## 2017-04-14 LAB — CBC WITH DIFFERENTIAL/PLATELET
BASOS ABS: 28 {cells}/uL (ref 0–200)
Basophils Relative: 0.4 %
EOS PCT: 2 %
Eosinophils Absolute: 142 cells/uL (ref 15–500)
HEMATOCRIT: 33.3 % — AB (ref 35.0–45.0)
Hemoglobin: 10.9 g/dL — ABNORMAL LOW (ref 11.7–15.5)
Lymphs Abs: 1548 cells/uL (ref 850–3900)
MCH: 29.7 pg (ref 27.0–33.0)
MCHC: 32.7 g/dL (ref 32.0–36.0)
MCV: 90.7 fL (ref 80.0–100.0)
MPV: 9.5 fL (ref 7.5–12.5)
Monocytes Relative: 5.8 %
NEUTROS PCT: 70 %
Neutro Abs: 4970 cells/uL (ref 1500–7800)
PLATELETS: 288 10*3/uL (ref 140–400)
RBC: 3.67 10*6/uL — AB (ref 3.80–5.10)
RDW: 12.6 % (ref 11.0–15.0)
TOTAL LYMPHOCYTE: 21.8 %
WBC mixed population: 412 cells/uL (ref 200–950)
WBC: 7.1 10*3/uL (ref 3.8–10.8)

## 2017-04-14 LAB — TEST AUTHORIZATION

## 2017-04-14 LAB — COMPLETE METABOLIC PANEL WITH GFR
AG RATIO: 2 (calc) (ref 1.0–2.5)
ALBUMIN MSPROF: 4 g/dL (ref 3.6–5.1)
ALT: 12 U/L (ref 6–29)
AST: 18 U/L (ref 10–35)
Alkaline phosphatase (APISO): 75 U/L (ref 33–130)
BILIRUBIN TOTAL: 0.4 mg/dL (ref 0.2–1.2)
BUN / CREAT RATIO: 15 (calc) (ref 6–22)
BUN: 16 mg/dL (ref 7–25)
CHLORIDE: 104 mmol/L (ref 98–110)
CO2: 24 mmol/L (ref 20–32)
Calcium: 9.2 mg/dL (ref 8.6–10.4)
Creat: 1.06 mg/dL — ABNORMAL HIGH (ref 0.60–0.93)
GFR, EST AFRICAN AMERICAN: 58 mL/min/{1.73_m2} — AB (ref >=60)
GFR, Est Non African American: 50 mL/min/{1.73_m2} — ABNORMAL LOW (ref >=60)
GLUCOSE: 95 mg/dL (ref 65–139)
Globulin: 2 g/dL (calc) (ref 1.9–3.7)
POTASSIUM: 4.7 mmol/L (ref 3.5–5.3)
SODIUM: 139 mmol/L (ref 135–146)
TOTAL PROTEIN: 6 g/dL — AB (ref 6.1–8.1)

## 2017-04-14 LAB — IRON,TIBC AND FERRITIN PANEL
%SAT: 32 % (ref 11–50)
Ferritin: 161 ng/mL (ref 20–288)
IRON: 82 ug/dL (ref 45–160)
TIBC: 258 mcg/dL (calc) (ref 250–450)

## 2017-04-19 ENCOUNTER — Other Ambulatory Visit: Payer: Medicare Other

## 2017-04-19 ENCOUNTER — Telehealth: Payer: Self-pay | Admitting: *Deleted

## 2017-04-19 DIAGNOSIS — D508 Other iron deficiency anemias: Secondary | ICD-10-CM

## 2017-04-19 NOTE — Telephone Encounter (Signed)
Patient wants to come into office this afternoon 9/19 to have her Hemoglobin rechecked before her surgery this Friday. Can we schedule this and place an order? Please Advise.

## 2017-04-19 NOTE — Telephone Encounter (Signed)
Patient came to office and spoke with Janett Billow wanting Kidney's checked also but it is too soon to check.  I took patient and husband into room and discussed previous labs. Patient just had labs drawn 6 days ago. Explained to patient that it would be too soon to tell a difference in values. Patient stated that she has stopped taking the Aleve and started drinking more water. Stated that she was not as concerned with the Hemoglobin as she was the kidney functions. Patient stated that she would just wait until PreOp at [facility] in October and have her labs re done at that time. Stated that she has already spoke with the [facility] nurse about this.   No labs done today. Bloodwork discussed and questions answered.

## 2017-04-19 NOTE — Telephone Encounter (Signed)
This is fine but I suspect they will recheck all this with her pre-op appt

## 2017-04-19 NOTE — Telephone Encounter (Signed)
Patient notified. Order placed and Lab appointment made.

## 2017-04-20 ENCOUNTER — Ambulatory Visit: Payer: Self-pay | Admitting: Orthopedic Surgery

## 2017-04-20 DIAGNOSIS — H401133 Primary open-angle glaucoma, bilateral, severe stage: Secondary | ICD-10-CM | POA: Diagnosis not present

## 2017-04-20 DIAGNOSIS — H353134 Nonexudative age-related macular degeneration, bilateral, advanced atrophic with subfoveal involvement: Secondary | ICD-10-CM | POA: Diagnosis not present

## 2017-04-27 DIAGNOSIS — H3530 Unspecified macular degeneration: Secondary | ICD-10-CM | POA: Diagnosis not present

## 2017-04-27 DIAGNOSIS — M19012 Primary osteoarthritis, left shoulder: Secondary | ICD-10-CM | POA: Diagnosis not present

## 2017-05-01 ENCOUNTER — Encounter (HOSPITAL_COMMUNITY): Payer: Self-pay

## 2017-05-01 NOTE — Patient Instructions (Signed)
ALEK PONCEDELEON  05/01/2017   Your procedure is scheduled on: 05/10/17  Report to [facility] Main  Entrance   Report to admitting at   0600 AM   Call this number if you have problems the morning of surgery  [phone]   Remember: ONLY 1 PERSON MAY GO WITH YOU TO SHORT STAY TO GET  READY MORNING OF YOUR SURGERY.  Do not eat food or drink liquids :After Midnight.     Take these medicines the morning of surgery with A SIP OF WATER:  Eye drops as usual                                You may not have any metal on your body including hair pins and              piercings  Do not wear jewelry, make-up, lotions, powders or perfumes, deodorant             Do not wear nail polish.  Do not shave  48 hours prior to surgery.                Do not bring valuables to the hospital. [place].  Contacts, dentures or bridgework may not be worn into surgery.  Leave suitcase in the car. After surgery it may be brought to your room.               Please read over the following fact sheets you were given: _____________________________________________________________________           [facility] - Preparing for Surgery Before surgery, you can play an important role.  Because skin is not sterile, your skin needs to be as free of germs as possible.  You can reduce the number of germs on your skin by washing with CHG (chlorahexidine gluconate) soap before surgery.  CHG is an antiseptic cleaner which kills germs and bonds with the skin to continue killing germs even after washing. Please DO NOT use if you have an allergy to CHG or antibacterial soaps.  If your skin becomes reddened/irritated stop using the CHG and inform your nurse when you arrive at Short Stay. Do not shave (including legs and underarms) for at least 48 hours prior to the first CHG shower.  You may shave your face/neck. Please follow these instructions  carefully:  1.  Shower with CHG Soap the night before surgery and the  morning of Surgery.  2.  If you choose to wash your hair, wash your hair first as usual with your  normal  shampoo.  3.  After you shampoo, rinse your hair and body thoroughly to remove the  shampoo.                           4.  Use CHG as you would any other liquid soap.  You can apply chg directly  to the skin and wash                       Gently with a scrungie or clean washcloth.  5.  Apply the CHG Soap to your body ONLY FROM THE NECK DOWN.  Do not use on face/ open                           Wound or open sores. Avoid contact with eyes, ears mouth and genitals (private parts).                       Wash face,  Genitals (private parts) with your normal soap.             6.  Wash thoroughly, paying special attention to the area where your surgery  will be performed.  7.  Thoroughly rinse your body with warm water from the neck down.  8.  DO NOT shower/wash with your normal soap after using and rinsing off  the CHG Soap.                9.  Pat yourself dry with a clean towel.            10.  Wear clean pajamas.            11.  Place clean sheets on your bed the night of your first shower and do not  sleep with pets. Day of Surgery : Do not apply any lotions/deodorants the morning of surgery.  Please wear clean clothes to the hospital/surgery center.  FAILURE TO FOLLOW THESE INSTRUCTIONS MAY RESULT IN THE CANCELLATION OF YOUR SURGERY PATIENT SIGNATURE_________________________________  NURSE SIGNATURE__________________________________  ________________________________________________________________________  WHAT IS A BLOOD TRANSFUSION? Blood Transfusion Information  A transfusion is the replacement of blood or some of its parts. Blood is made up of multiple cells which provide different functions.  Red blood cells carry oxygen and are used for blood loss replacement.  White blood cells fight against  infection.  Platelets control bleeding.  Plasma helps clot blood.  Other blood products are available for specialized needs, such as hemophilia or other clotting disorders. BEFORE THE TRANSFUSION  Who gives blood for transfusions?   Healthy volunteers who are fully evaluated to make sure their blood is safe. This is blood bank blood. Transfusion therapy is the safest it has ever been in the practice of medicine. Before blood is taken from a donor, a complete history is taken to make sure that person has no history of diseases nor engages in risky social behavior (examples are intravenous drug use or sexual activity with multiple partners). The donor's travel history is screened to minimize risk of transmitting infections, such as malaria. The donated blood is tested for signs of infectious diseases, such as HIV and hepatitis. The blood is then tested to be sure it is compatible with you in order to minimize the chance of a transfusion reaction. If you or a relative donates blood, this is often done in anticipation of surgery and is not appropriate for emergency situations. It takes many days to process the donated blood. RISKS AND COMPLICATIONS Although transfusion therapy is very safe and saves many lives, the main dangers of transfusion include:   Getting an infectious disease.  Developing a transfusion reaction. This is an allergic reaction to something in the blood you were given. Every precaution is taken to prevent this. The decision to have a blood transfusion has been considered carefully by your caregiver before blood is given. Blood is not given unless the benefits outweigh the risks. AFTER THE TRANSFUSION  Right after receiving a blood transfusion, you will usually feel much better and more energetic. This is especially  true if your red blood cells have gotten low (anemic). The transfusion raises the level of the red blood cells which carry oxygen, and this usually causes an energy  increase.  The nurse administering the transfusion will monitor you carefully for complications. HOME CARE INSTRUCTIONS  No special instructions are needed after a transfusion. You may find your energy is better. Speak with your caregiver about any limitations on activity for underlying diseases you may have. SEEK MEDICAL CARE IF:   Your condition is not improving after your transfusion.  You develop redness or irritation at the intravenous (IV) site. SEEK IMMEDIATE MEDICAL CARE IF:  Any of the following symptoms occur over the next 12 hours:  Shaking chills.  You have a temperature by mouth above 102 F (38.9 C), not controlled by medicine.  Chest, back, or muscle pain.  People around you feel you are not acting correctly or are confused.  Shortness of breath or difficulty breathing.  Dizziness and fainting.  You get a rash or develop hives.  You have a decrease in urine output.  Your urine turns a dark color or changes to pink, red, or brown. Any of the following symptoms occur over the next 10 days:  You have a temperature by mouth above 102 F (38.9 C), not controlled by medicine.  Shortness of breath.  Weakness after normal activity.  The white part of the eye turns yellow (jaundice).  You have a decrease in the amount of urine or are urinating less often.  Your urine turns a dark color or changes to pink, red, or brown. Document Released: 07/15/2000 Document Revised: 10/10/2011 Document Reviewed: 03/03/2008 ExitCare Patient Information 2014 [place].  _______________________________________________________________________  Incentive Spirometer  An incentive spirometer is a tool that can help keep your lungs clear and active. This tool measures how well you are filling your lungs with each breath. Taking long deep breaths may help reverse or decrease the chance of developing breathing (pulmonary) problems (especially infection) following:  A long  period of time when you are unable to move or be active. BEFORE THE PROCEDURE   If the spirometer includes an indicator to show your best effort, your nurse or respiratory therapist will set it to a desired goal.  If possible, sit up straight or lean slightly forward. Try not to slouch.  Hold the incentive spirometer in an upright position. INSTRUCTIONS FOR USE  1. Sit on the edge of your bed if possible, or sit up as far as you can in bed or on a chair. 2. Hold the incentive spirometer in an upright position. 3. Breathe out normally. 4. Place the mouthpiece in your mouth and seal your lips tightly around it. 5. Breathe in slowly and as deeply as possible, raising the piston or the ball toward the top of the column. 6. Hold your breath for 3-5 seconds or for as long as possible. Allow the piston or ball to fall to the bottom of the column. 7. Remove the mouthpiece from your mouth and breathe out normally. 8. Rest for a few seconds and repeat Steps 1 through 7 at least 10 times every 1-2 hours when you are awake. Take your time and take a few normal breaths between deep breaths. 9. The spirometer may include an indicator to show your best effort. Use the indicator as a goal to work toward during each repetition. 10. After each set of 10 deep breaths, practice coughing to be sure your lungs are clear. If you have  an incision (the cut made at the time of surgery), support your incision when coughing by placing a pillow or rolled up towels firmly against it. Once you are able to get out of bed, walk around indoors and cough well. You may stop using the incentive spirometer when instructed by your caregiver.  RISKS AND COMPLICATIONS  Take your time so you do not get dizzy or light-headed.  If you are in pain, you may need to take or ask for pain medication before doing incentive spirometry. It is harder to take a deep breath if you are having pain. AFTER USE  Rest and breathe slowly and  easily.  It can be helpful to keep track of a log of your progress. Your caregiver can provide you with a simple table to help with this. If you are using the spirometer at home, follow these instructions: [place] IF:   You are having difficultly using the spirometer.  You have trouble using the spirometer as often as instructed.  Your pain medication is not giving enough relief while using the spirometer.  You develop fever of 100.5 F (38.1 C) or higher. SEEK IMMEDIATE MEDICAL CARE IF:   You cough up bloody sputum that had not been present before.  You develop fever of 102 F (38.9 C) or greater.  You develop worsening pain at or near the incision site. MAKE SURE YOU:   Understand these instructions.  Will watch your condition.  Will get help right away if you are not doing well or get worse. Document Released: 11/28/2006 Document Revised: 10/10/2011 Document Reviewed: 01/29/2007 [facility] Patient Information 2014 [place], [place].   ________________________________________________________________________

## 2017-05-03 ENCOUNTER — Encounter (HOSPITAL_COMMUNITY)
Admission: RE | Admit: 2017-05-03 | Discharge: 2017-05-03 | Disposition: A | Discharge status: Home / Self Care | Payer: Medicare Other | Source: Ambulatory Visit | Attending: Orthopedic Surgery | Admitting: Orthopedic Surgery

## 2017-05-03 ENCOUNTER — Encounter (HOSPITAL_COMMUNITY): Payer: Self-pay

## 2017-05-03 DIAGNOSIS — M1710 Unilateral primary osteoarthritis, unspecified knee: Secondary | ICD-10-CM | POA: Insufficient documentation

## 2017-05-03 DIAGNOSIS — E785 Hyperlipidemia, unspecified: Secondary | ICD-10-CM | POA: Diagnosis not present

## 2017-05-03 DIAGNOSIS — D649 Anemia, unspecified: Secondary | ICD-10-CM | POA: Diagnosis not present

## 2017-05-03 DIAGNOSIS — G4700 Insomnia, unspecified: Secondary | ICD-10-CM | POA: Insufficient documentation

## 2017-05-03 DIAGNOSIS — H547 Unspecified visual loss: Secondary | ICD-10-CM | POA: Insufficient documentation

## 2017-05-03 DIAGNOSIS — H409 Unspecified glaucoma: Secondary | ICD-10-CM | POA: Insufficient documentation

## 2017-05-03 DIAGNOSIS — Z01812 Encounter for preprocedural laboratory examination: Secondary | ICD-10-CM | POA: Diagnosis not present

## 2017-05-03 DIAGNOSIS — M1612 Unilateral primary osteoarthritis, left hip: Secondary | ICD-10-CM | POA: Insufficient documentation

## 2017-05-03 HISTORY — DX: Headache: R51

## 2017-05-03 HISTORY — DX: Anemia, unspecified: D64.9

## 2017-05-03 LAB — COMPREHENSIVE METABOLIC PANEL
ALBUMIN: 4.3 g/dL (ref 3.5–5.0)
ALK PHOS: 82 U/L (ref 38–126)
ALT: 32 U/L (ref 14–54)
ANION GAP: 8 (ref 5–15)
AST: 33 U/L (ref 15–41)
BILIRUBIN TOTAL: 0.5 mg/dL (ref 0.3–1.2)
BUN: 13 mg/dL (ref 6–20)
CALCIUM: 9.1 mg/dL (ref 8.9–10.3)
CO2: 24 mmol/L (ref 22–32)
Chloride: 106 mmol/L (ref 101–111)
Creatinine, Ser: 0.84 mg/dL (ref 0.44–1.00)
GFR calc Af Amer: >60 mL/min (ref >60)
GLUCOSE: 99 mg/dL (ref 65–99)
Potassium: 4.4 mmol/L (ref 3.5–5.1)
Sodium: 138 mmol/L (ref 135–145)
TOTAL PROTEIN: 6.8 g/dL (ref 6.5–8.1)

## 2017-05-03 LAB — PROTIME-INR
INR: 0.94
Prothrombin Time: 12.4 seconds (ref 11.4–15.2)

## 2017-05-03 LAB — CBC
HEMATOCRIT: 33.6 % — AB (ref 36.0–46.0)
HEMOGLOBIN: 11.2 g/dL — AB (ref 12.0–15.0)
MCH: 29.9 pg (ref 26.0–34.0)
MCHC: 33.3 g/dL (ref 30.0–36.0)
MCV: 89.6 fL (ref 78.0–100.0)
Platelets: 278 10*3/uL (ref 150–400)
RBC: 3.75 MIL/uL — ABNORMAL LOW (ref 3.87–5.11)
RDW: 13.3 % (ref 11.5–15.5)
WBC: 7.1 10*3/uL (ref 4.0–10.5)

## 2017-05-03 LAB — SURGICAL PCR SCREEN
MRSA, PCR: NEGATIVE
STAPHYLOCOCCUS AUREUS: NEGATIVE

## 2017-05-03 LAB — APTT: aPTT: 32 seconds (ref 24–36)

## 2017-05-03 NOTE — Progress Notes (Signed)
Clearance Janett Billow Eubanks,NP 04/13/17 chart ovn 04/13/17 Chart ekg 04/13/17 chart

## 2017-05-07 ENCOUNTER — Ambulatory Visit: Payer: Self-pay | Admitting: Orthopedic Surgery

## 2017-05-07 NOTE — H&P (Signed)
PATIENT IS BLIND  Meredith Moore DOB: 1936-06-21 Married / Language: English / Race: White Female Date of Admission:  05/10/2017 CC:  Left Hip Pain History of Present Illness  The patient is a 81 year old female who comes in for a preoperative History and Physical. The patient is scheduled for a left total hip arthroplasty (anterior) to be performed by Dr. Dione Plover. Aluisio, MD at [facility] on 05/10/2017. The patient is a 81 year old female who presented for follow up of their hip. The patient is being followed for their left hip pain and osteoarthritis. They are months out from intra-articular injection. Symptoms reported include: pain, aching, stiffness and difficulty ambulating. The patient feels that they are doing poorly. Current treatment includes: activity modification. The following medication has been used for pain control: antiinflammatory medication (ibuprofen). The patient has reported improvement of their symptoms with: Cortisone injections. She states that she had some fluid in her hip, and Dr. Nelva Bush aspirated after the injection. She states that the left knee is bothering her too. She has had some intermittent knee pain, and states that she has a grinding sensation in her knee. She does get pain from her left groin down to the knee. She is having some increased back pain as well. She says that she cannot continue like she is and now has reached a point where she would like to proceed with surgery. She has had a very successful RIGHT total hip. She said from pain standpoint this hip is giving her a lot of trouble but even worse of the functional problems. She is at a stage now where she is ready to get the LEFT hip fixed. They have been treated conservatively in the past for the above stated problem and despite conservative measures, they continue to have progressive pain and severe functional limitations and dysfunction. They have failed non-operative management including  home exercise, medications, and injections. It is felt that they would benefit from undergoing total joint replacement. Risks and benefits of the procedure have been discussed with the patient and they elect to proceed with surgery. There are no active contraindications to surgery such as ongoing infection or rapidly progressive neurological disease.  Please note that the patient is blind but does read braille.   Problem List/Past Medical Acute pain of right hip (M25.551)  Aftercare following right hip joint replacement surgery (Z47.1)  Primary osteoarthritis of one knee (M17.10)  Status post total right knee replacement (S28.315)  Impingement syndrome of left shoulder (M75.42)  Primary osteoarthritis of left hip (M16.12)  Status post hip replacement, right (V76.160)  Hip Dysplasia  Hypercholesterolemia  Osteoarthritis  Blindness  Does read braile Glaucoma  Herpes Zoster  Macular Degeneration  Osteoporosis  Chronic kidney disease  Anemia   Allergies  TraMADol HCl *ANALGESICS - OPIOID*  Nickel  Formaldehyde *ANTISEPTICS & DISINFECTANTS*  Restoril *HYPNOTICS*  worsens symptoms of glaucoma  Family History Cancer  mother, brother and grandmother fathers side Cerebrovascular Accident  mother and grandmother mothers side Heart Disease  father and grandfather fathers side Kidney disease  grandmother mothers side Osteoarthritis  First Degree Relatives. mother Father  Deceased. age 27 Mother  Deceased. age 30  Social History  Number of flights of stairs before winded  2-3 Marital status  married Living situation  live with spouse Tobacco / smoke exposure  yes outdoors only Tobacco use  Former smoker. former smoker; smoke(d) less than 1/2 pack(s) per day/has not smoked in over 20 years Pain Contract  no Illicit drug use  no Current work status  retired Children  3 Alcohol use  former drinker Exercise  Exercises weekly; does gym /  [employer] Drug/Alcohol Rehab (Previously)  no Drug/Alcohol Rehab (Currently)  no  Medication History  Atorvastatin Calcium (10MG  Tablet, 5 mg Oral, Taken starting 06/04/2014) Active. Lutein Vision Blend (Oral) Active. Womens Laxative (Oral) Specific strength unknown - Active. Eye Vitamins (Oral) Active. Xalatan (0.005% Solution, Ophthalmic) Active. Timolol Maleate (0.5% Solution, Ophthalmic) Active. Simbrinza (1-0.2% Suspension, Ophthalmic) Active. Ibuprofen (200MG  Tablet, Oral) Active. QUEtiapine Fumarate (25MG  Tablet, 12.5 MG QHS Oral) Active. Dorzolamide HCl (2% Solution, Ophthalmic) Active. Aspirin EC Low Strength (81MG  Tablet DR, Oral) Active. Tylenol (500MG  Capsule, Oral) Active.  Past Surgical History  Appendectomy  Date: 1979. had a benign tumor removed from the right ovary at the same time Cataract Surgery  bilateral Tonsillectomy  Date: 1945. Belpharoptosis Repair  Date: 81. Robotic Assisted Total Hysterectomy with BSO  Date: 03/2014. Total Hip Replacement - Right  Date: 06/2014. Total Knee Replacement - Right  Date: 09/2012.   Review of Systems General Not Present- Chills, Fatigue, Fever, Memory Loss, Night Sweats, Weight Gain and Weight Loss. Skin Not Present- Eczema, Hives, Itching, Lesions and Rash. HEENT Present- Blurred Vision, Color Blindness, Double Vision, Glaucoma, Vision problems and Visual Loss (Patient is blind). Not Present- Dentures, Headache, Hearing Loss and Tinnitus. Respiratory Not Present- Allergies, Chronic Cough, Coughing up blood, Shortness of breath at rest and Shortness of breath with exertion. Cardiovascular Not Present- Chest Pain, Difficulty Breathing Lying Down, Murmur, Palpitations, Racing/skipping heartbeats and Swelling. Gastrointestinal Not Present- Abdominal Pain, Bloody Stool, Constipation, Diarrhea, Difficulty Swallowing, Heartburn, Jaundice, Loss of appetitie, Nausea and Vomiting. Female Genitourinary Not Present-  Blood in Urine, Discharge, Flank Pain, Incontinence, Painful Urination, Urgency, Urinary frequency, Urinary Retention, Urinating at Night and Weak urinary stream. Musculoskeletal Not Present- Back Pain, Joint Pain, Joint Swelling, Morning Stiffness, Muscle Pain, Muscle Weakness and Spasms. Neurological Not Present- Blackout spells, Difficulty with balance, Dizziness, Paralysis, Tremor and Weakness. Psychiatric Not Present- Insomnia.  Vitals  Weight: 132 lb Height: 64in Weight was reported by patient. Height was reported by patient. Body Surface Area: 1.64 m Body Mass Index: 22.66 kg/m  Pulse: 64 (Regular)  BP: 132/78 (Sitting, Left Arm, Standard)  Physical Exam  General Mental Status -Alert, cooperative and good historian. General Appearance-pleasant, Not in acute distress. Orientation-Oriented X3. Build & Nutrition-Well nourished and Well developed.  Head and Neck Head-normocephalic, atraumatic . Neck Global Assessment - supple, no bruit auscultated on the right, no bruit auscultated on the left.  Eye Vision-Decreased(both eyes secondary to severe glaucoma and macular degeneration). Pupil - Bilateral-Regular and Round. Motion - Bilateral-EOMI.  Chest and Lung Exam Auscultation Breath sounds - clear at anterior chest wall and clear at posterior chest wall. Adventitious sounds - No Adventitious sounds.  Cardiovascular Auscultation Rhythm - Regular rate and rhythm. Heart Sounds - S1 WNL and S2 WNL. Murmurs & Other Heart Sounds - Auscultation of the heart reveals - No Murmurs.  Abdomen Palpation/Percussion Tenderness - Abdomen is non-tender to palpation. Rigidity (guarding) - Abdomen is soft. Auscultation Auscultation of the abdomen reveals - Bowel sounds normal.  Female Genitourinary Note: Not done, not pertinent to present illness   Musculoskeletal Note: Well-developed female alert and oriented in no apparent distress. Her LEFT hip can be  flexed to 100 minimal internal rotation about 20 external rotation 20 abduction. There is pain on range of motion of that hip. Her RIGHT hip has excellent motion without discomfort.  Her x-rays are reviewed again she has got an element of bone-on-bone arthritis in that LEFT hip.     Assessment & Plan Status post hip replacement, right (N05.397) Primary osteoarthritis of left hip (M16.12)  Note:Surgical Plans: Left Total Hip Replacement - Anterior Approach  Disposition: Home with husband, Start HHPT Has walker and raised toilet seat at home  PCP: [facility]  IV TXA  Anesthesia Issues: None except for panic attack following hysterectomy surgery.  Patient was instructed on what medications to stop prior to surgery.  Sent Eliquis in prior to surgery to [facility] due to renal insuff. and the patient needed prior authorization on her previous surgery for the Xarelto.  Please note that the patient is blind so please remind all staff to introduce themselves when coming into the room. She does read braille so please provide braille literature when possible.  Signed electronically by Joelene Millin, III PA-C

## 2017-05-10 ENCOUNTER — Inpatient Hospital Stay (HOSPITAL_COMMUNITY): Payer: Medicare Other | Admitting: Certified Registered"

## 2017-05-10 ENCOUNTER — Encounter (HOSPITAL_COMMUNITY)
Admission: RE | Disposition: A | Discharge status: Home Health | Payer: Self-pay | Source: Ambulatory Visit | Attending: Orthopedic Surgery

## 2017-05-10 ENCOUNTER — Encounter (HOSPITAL_COMMUNITY): Payer: Self-pay | Admitting: Certified Registered"

## 2017-05-10 ENCOUNTER — Inpatient Hospital Stay (HOSPITAL_COMMUNITY): Payer: Medicare Other

## 2017-05-10 ENCOUNTER — Inpatient Hospital Stay (HOSPITAL_COMMUNITY)
Admission: RE | Admit: 2017-05-10 | Discharge: 2017-05-12 | DRG: 470 | Disposition: A | Discharge status: Home Health | Payer: Medicare Other | Source: Ambulatory Visit | Attending: Orthopedic Surgery | Admitting: Orthopedic Surgery

## 2017-05-10 DIAGNOSIS — D649 Anemia, unspecified: Secondary | ICD-10-CM | POA: Diagnosis present

## 2017-05-10 DIAGNOSIS — Z7982 Long term (current) use of aspirin: Secondary | ICD-10-CM | POA: Diagnosis not present

## 2017-05-10 DIAGNOSIS — Z87891 Personal history of nicotine dependence: Secondary | ICD-10-CM | POA: Diagnosis not present

## 2017-05-10 DIAGNOSIS — Z823 Family history of stroke: Secondary | ICD-10-CM

## 2017-05-10 DIAGNOSIS — G4700 Insomnia, unspecified: Secondary | ICD-10-CM | POA: Diagnosis not present

## 2017-05-10 DIAGNOSIS — M810 Age-related osteoporosis without current pathological fracture: Secondary | ICD-10-CM | POA: Diagnosis present

## 2017-05-10 DIAGNOSIS — H409 Unspecified glaucoma: Secondary | ICD-10-CM | POA: Diagnosis not present

## 2017-05-10 DIAGNOSIS — Z96649 Presence of unspecified artificial hip joint: Secondary | ICD-10-CM

## 2017-05-10 DIAGNOSIS — M169 Osteoarthritis of hip, unspecified: Secondary | ICD-10-CM | POA: Diagnosis present

## 2017-05-10 DIAGNOSIS — Z96641 Presence of right artificial hip joint: Secondary | ICD-10-CM | POA: Diagnosis present

## 2017-05-10 DIAGNOSIS — H3530 Unspecified macular degeneration: Secondary | ICD-10-CM | POA: Diagnosis present

## 2017-05-10 DIAGNOSIS — Z8249 Family history of ischemic heart disease and other diseases of the circulatory system: Secondary | ICD-10-CM

## 2017-05-10 DIAGNOSIS — M1612 Unilateral primary osteoarthritis, left hip: Principal | ICD-10-CM | POA: Diagnosis present

## 2017-05-10 DIAGNOSIS — Z96651 Presence of right artificial knee joint: Secondary | ICD-10-CM | POA: Diagnosis not present

## 2017-05-10 DIAGNOSIS — E785 Hyperlipidemia, unspecified: Secondary | ICD-10-CM | POA: Diagnosis not present

## 2017-05-10 DIAGNOSIS — H548 Legal blindness, as defined in USA: Secondary | ICD-10-CM | POA: Diagnosis not present

## 2017-05-10 HISTORY — PX: TOTAL HIP ARTHROPLASTY: SHX124

## 2017-05-10 LAB — TYPE AND SCREEN
ABO/RH(D): A POS
Antibody Screen: NEGATIVE

## 2017-05-10 SURGERY — ARTHROPLASTY, HIP, TOTAL, ANTERIOR APPROACH
Anesthesia: Monitor Anesthesia Care | Site: Hip | Laterality: Left

## 2017-05-10 MED ORDER — LIDOCAINE 2% (20 MG/ML) 5 ML SYRINGE
INTRAMUSCULAR | Status: AC
  Filled 2017-05-10: qty 5

## 2017-05-10 MED ORDER — ONDANSETRON HCL 4 MG/2ML IJ SOLN
INTRAMUSCULAR | Status: DC | PRN
  Administered 2017-05-10: 4 mg via INTRAVENOUS

## 2017-05-10 MED ORDER — PHENYLEPHRINE 40 MCG/ML (10ML) SYRINGE FOR IV PUSH (FOR BLOOD PRESSURE SUPPORT)
PREFILLED_SYRINGE | INTRAVENOUS | Status: DC | PRN
  Administered 2017-05-10 (×3): 80 ug via INTRAVENOUS

## 2017-05-10 MED ORDER — ATORVASTATIN CALCIUM 10 MG PO TABS
5.0000 mg | ORAL_TABLET | Freq: Every day | ORAL | Status: DC
  Administered 2017-05-10 – 2017-05-11 (×2): 5 mg via ORAL
  Filled 2017-05-10 (×2): qty 1

## 2017-05-10 MED ORDER — MORPHINE SULFATE (PF) 4 MG/ML IV SOLN
1.0000 mg | INTRAVENOUS | Status: DC | PRN
  Administered 2017-05-10: 1 mg via INTRAVENOUS
  Filled 2017-05-10: qty 1

## 2017-05-10 MED ORDER — OXYCODONE HCL 5 MG PO TABS
5.0000 mg | ORAL_TABLET | ORAL | Status: DC | PRN
  Administered 2017-05-10 (×2): 5 mg via ORAL
  Administered 2017-05-11 (×2): 10 mg via ORAL
  Administered 2017-05-12: 5 mg via ORAL
  Filled 2017-05-10: qty 1
  Filled 2017-05-10 (×2): qty 2
  Filled 2017-05-10 (×2): qty 1

## 2017-05-10 MED ORDER — DOCUSATE SODIUM 100 MG PO CAPS
100.0000 mg | ORAL_CAPSULE | Freq: Two times a day (BID) | ORAL | Status: DC
  Administered 2017-05-10 – 2017-05-12 (×4): 100 mg via ORAL
  Filled 2017-05-10 (×4): qty 1

## 2017-05-10 MED ORDER — FENTANYL CITRATE (PF) 100 MCG/2ML IJ SOLN
INTRAMUSCULAR | Status: AC
  Filled 2017-05-10: qty 2

## 2017-05-10 MED ORDER — ACETAMINOPHEN 160 MG/5ML PO SOLN: 325.0000 mg | ORAL | Status: DC | PRN

## 2017-05-10 MED ORDER — ONDANSETRON HCL 4 MG/2ML IJ SOLN: 4.0000 mg | Freq: Four times a day (QID) | INTRAMUSCULAR | Status: DC | PRN

## 2017-05-10 MED ORDER — PROPOFOL 10 MG/ML IV BOLUS
INTRAVENOUS | Status: AC
  Filled 2017-05-10: qty 20

## 2017-05-10 MED ORDER — LACTATED RINGERS IV SOLN
INTRAVENOUS | Status: DC
  Administered 2017-05-10 (×2): via INTRAVENOUS
  Administered 2017-05-10: 1000 mL via INTRAVENOUS

## 2017-05-10 MED ORDER — METOCLOPRAMIDE HCL 5 MG/ML IJ SOLN
5.0000 mg | Freq: Three times a day (TID) | INTRAMUSCULAR | Status: DC | PRN
Start: 2017-05-10 — End: 2017-05-12

## 2017-05-10 MED ORDER — CHLORHEXIDINE GLUCONATE 4 % EX LIQD: 60.0000 mL | Freq: Once | CUTANEOUS | Status: DC

## 2017-05-10 MED ORDER — MIDAZOLAM HCL 2 MG/2ML IJ SOLN
INTRAMUSCULAR | Status: AC
  Filled 2017-05-10: qty 2

## 2017-05-10 MED ORDER — FENTANYL CITRATE (PF) 100 MCG/2ML IJ SOLN
25.0000 ug | INTRAMUSCULAR | Status: DC | PRN
  Administered 2017-05-10: 50 ug via INTRAVENOUS
  Administered 2017-05-10 (×2): 25 ug via INTRAVENOUS

## 2017-05-10 MED ORDER — FLEET ENEMA 7-19 GM/118ML RE ENEM: 1.0000 | ENEMA | Freq: Once | RECTAL | Status: DC | PRN

## 2017-05-10 MED ORDER — METOCLOPRAMIDE HCL 5 MG PO TABS: 5.0000 mg | ORAL_TABLET | Freq: Three times a day (TID) | ORAL | Status: DC | PRN

## 2017-05-10 MED ORDER — TRANEXAMIC ACID 1000 MG/10ML IV SOLN
1000.0000 mg | INTRAVENOUS | Status: AC
  Administered 2017-05-10: 1000 mg via INTRAVENOUS
  Filled 2017-05-10: qty 1100

## 2017-05-10 MED ORDER — ONDANSETRON HCL 4 MG PO TABS: 4.0000 mg | ORAL_TABLET | Freq: Four times a day (QID) | ORAL | Status: DC | PRN

## 2017-05-10 MED ORDER — EPHEDRINE SULFATE-NACL 50-0.9 MG/10ML-% IV SOSY
PREFILLED_SYRINGE | INTRAVENOUS | Status: DC | PRN
  Administered 2017-05-10: 10 mg via INTRAVENOUS

## 2017-05-10 MED ORDER — LIDOCAINE 2% (20 MG/ML) 5 ML SYRINGE
INTRAMUSCULAR | Status: DC | PRN
  Administered 2017-05-10: 50 mg via INTRAVENOUS

## 2017-05-10 MED ORDER — BUPIVACAINE IN DEXTROSE 0.75-8.25 % IT SOLN
INTRATHECAL | Status: DC | PRN
  Administered 2017-05-10: 2 mL via INTRATHECAL

## 2017-05-10 MED ORDER — SODIUM CHLORIDE 0.9 % IV SOLN
INTRAVENOUS | Status: DC
  Administered 2017-05-10: 14:00:00 via INTRAVENOUS

## 2017-05-10 MED ORDER — DEXAMETHASONE SODIUM PHOSPHATE 10 MG/ML IJ SOLN
10.0000 mg | Freq: Once | INTRAMUSCULAR | Status: AC
  Administered 2017-05-11: 10 mg via INTRAVENOUS
  Filled 2017-05-10: qty 1

## 2017-05-10 MED ORDER — ACETAMINOPHEN 10 MG/ML IV SOLN
INTRAVENOUS | Status: AC
  Filled 2017-05-10: qty 100

## 2017-05-10 MED ORDER — ACETAMINOPHEN 325 MG PO TABS: 325.0000 mg | ORAL_TABLET | ORAL | Status: DC | PRN

## 2017-05-10 MED ORDER — BRINZOLAMIDE-BRIMONIDINE 1-0.2 % OP SUSP
1.0000 [drp] | Freq: Three times a day (TID) | OPHTHALMIC | Status: DC
  Administered 2017-05-11: 15:00:00 1 [drp] via OPHTHALMIC

## 2017-05-10 MED ORDER — PHENYLEPHRINE 40 MCG/ML (10ML) SYRINGE FOR IV PUSH (FOR BLOOD PRESSURE SUPPORT)
PREFILLED_SYRINGE | INTRAVENOUS | Status: AC
  Filled 2017-05-10: qty 10

## 2017-05-10 MED ORDER — ACETAMINOPHEN 10 MG/ML IV SOLN
1000.0000 mg | Freq: Once | INTRAVENOUS | Status: AC
  Administered 2017-05-10: 1000 mg via INTRAVENOUS

## 2017-05-10 MED ORDER — PROPOFOL 500 MG/50ML IV EMUL
INTRAVENOUS | Status: DC | PRN
  Administered 2017-05-10: 75 ug/kg/min via INTRAVENOUS

## 2017-05-10 MED ORDER — ACETAMINOPHEN 650 MG RE SUPP: 650.0000 mg | Freq: Four times a day (QID) | RECTAL | Status: DC | PRN

## 2017-05-10 MED ORDER — OXYCODONE HCL 5 MG PO TABS: 5.0000 mg | ORAL_TABLET | Freq: Once | ORAL | Status: DC | PRN

## 2017-05-10 MED ORDER — POLYETHYLENE GLYCOL 3350 17 G PO PACK: 17.0000 g | PACK | Freq: Every day | ORAL | Status: DC | PRN

## 2017-05-10 MED ORDER — CEFAZOLIN SODIUM-DEXTROSE 2-4 GM/100ML-% IV SOLN
INTRAVENOUS | Status: AC
  Filled 2017-05-10: qty 100

## 2017-05-10 MED ORDER — METHOCARBAMOL 500 MG PO TABS
500.0000 mg | ORAL_TABLET | Freq: Four times a day (QID) | ORAL | Status: DC | PRN
  Administered 2017-05-11 – 2017-05-12 (×3): 500 mg via ORAL
  Filled 2017-05-10 (×3): qty 1

## 2017-05-10 MED ORDER — DEXTROSE 5 % IV SOLN
500.0000 mg | Freq: Four times a day (QID) | INTRAVENOUS | Status: DC | PRN
  Administered 2017-05-10: 500 mg via INTRAVENOUS
  Filled 2017-05-10: qty 550

## 2017-05-10 MED ORDER — MECLIZINE HCL 25 MG PO TABS: 12.5000 mg | ORAL_TABLET | Freq: Four times a day (QID) | ORAL | Status: DC | PRN

## 2017-05-10 MED ORDER — QUETIAPINE FUMARATE 25 MG PO TABS
12.5000 mg | ORAL_TABLET | Freq: Every day | ORAL | Status: DC
  Administered 2017-05-10 – 2017-05-11 (×2): 12.5 mg via ORAL
  Filled 2017-05-10 (×2): qty 1

## 2017-05-10 MED ORDER — BUPIVACAINE HCL (PF) 0.25 % IJ SOLN
INTRAMUSCULAR | Status: AC
  Filled 2017-05-10: qty 30

## 2017-05-10 MED ORDER — ACETAMINOPHEN 500 MG PO TABS
1000.0000 mg | ORAL_TABLET | Freq: Four times a day (QID) | ORAL | Status: AC
  Administered 2017-05-10 – 2017-05-11 (×4): 1000 mg via ORAL
  Filled 2017-05-10 (×4): qty 2

## 2017-05-10 MED ORDER — PHENYLEPHRINE HCL 10 MG/ML IJ SOLN
INTRAVENOUS | Status: DC | PRN
  Administered 2017-05-10: 10 ug/min via INTRAVENOUS

## 2017-05-10 MED ORDER — MENTHOL 3 MG MT LOZG: 1.0000 | LOZENGE | OROMUCOSAL | Status: DC | PRN

## 2017-05-10 MED ORDER — RIVAROXABAN 10 MG PO TABS
10.0000 mg | ORAL_TABLET | Freq: Every day | ORAL | Status: DC
  Filled 2017-05-10: qty 1

## 2017-05-10 MED ORDER — ACETAMINOPHEN 325 MG PO TABS
650.0000 mg | ORAL_TABLET | Freq: Four times a day (QID) | ORAL | Status: DC | PRN
  Administered 2017-05-12: 08:00:00 650 mg via ORAL
  Filled 2017-05-10: qty 2

## 2017-05-10 MED ORDER — PHENYLEPHRINE HCL 10 MG/ML IJ SOLN
INTRAMUSCULAR | Status: AC
  Filled 2017-05-10: qty 1

## 2017-05-10 MED ORDER — SODIUM CHLORIDE 0.9 % IR SOLN
Status: DC | PRN
  Administered 2017-05-10: 1000 mL

## 2017-05-10 MED ORDER — CEFAZOLIN SODIUM-DEXTROSE 2-4 GM/100ML-% IV SOLN
2.0000 g | INTRAVENOUS | Status: AC
  Administered 2017-05-10: 2 g via INTRAVENOUS

## 2017-05-10 MED ORDER — BUPIVACAINE HCL (PF) 0.25 % IJ SOLN
INTRAMUSCULAR | Status: DC | PRN
  Administered 2017-05-10: 30 mL

## 2017-05-10 MED ORDER — FENTANYL CITRATE (PF) 100 MCG/2ML IJ SOLN
INTRAMUSCULAR | Status: AC
  Administered 2017-05-10: 25 ug via INTRAVENOUS
  Filled 2017-05-10: qty 4

## 2017-05-10 MED ORDER — BISACODYL 10 MG RE SUPP: 10.0000 mg | Freq: Every day | RECTAL | Status: DC | PRN

## 2017-05-10 MED ORDER — CEFAZOLIN SODIUM-DEXTROSE 1-4 GM/50ML-% IV SOLN
1.0000 g | Freq: Four times a day (QID) | INTRAVENOUS | Status: AC
  Administered 2017-05-10 (×2): 1 g via INTRAVENOUS
  Filled 2017-05-10 (×2): qty 50

## 2017-05-10 MED ORDER — LATANOPROST 0.005 % OP SOLN
1.0000 [drp] | Freq: Every day | OPHTHALMIC | Status: DC
  Administered 2017-05-10 – 2017-05-11 (×2): 1 [drp] via OPHTHALMIC
  Filled 2017-05-10: qty 2.5

## 2017-05-10 MED ORDER — PHENOL 1.4 % MT LIQD
1.0000 | OROMUCOSAL | Status: DC | PRN
  Filled 2017-05-10: qty 177

## 2017-05-10 MED ORDER — OXYCODONE HCL 5 MG/5ML PO SOLN
5.0000 mg | Freq: Once | ORAL | Status: DC | PRN
  Filled 2017-05-10: qty 5

## 2017-05-10 MED ORDER — TIMOLOL MALEATE 0.5 % OP SOLN
1.0000 [drp] | Freq: Two times a day (BID) | OPHTHALMIC | Status: DC
  Administered 2017-05-10 – 2017-05-12 (×3): 1 [drp] via OPHTHALMIC
  Filled 2017-05-10: qty 5

## 2017-05-10 MED ORDER — DIPHENHYDRAMINE HCL 12.5 MG/5ML PO ELIX: 12.5000 mg | ORAL_SOLUTION | ORAL | Status: DC | PRN

## 2017-05-10 MED ORDER — DEXAMETHASONE SODIUM PHOSPHATE 10 MG/ML IJ SOLN
10.0000 mg | Freq: Once | INTRAMUSCULAR | Status: AC
  Administered 2017-05-10: 10 mg via INTRAVENOUS

## 2017-05-10 MED ORDER — TRANEXAMIC ACID 1000 MG/10ML IV SOLN
1000.0000 mg | Freq: Once | INTRAVENOUS | Status: AC
  Administered 2017-05-10: 14:00:00 1000 mg via INTRAVENOUS
  Filled 2017-05-10: qty 1100

## 2017-05-10 SURGICAL SUPPLY — 34 items
BAG DECANTER FOR FLEXI CONT (MISCELLANEOUS) ×3 IMPLANT
BAG ZIPLOCK 12X15 (MISCELLANEOUS) IMPLANT
BLADE SAG 18X100X1.27 (BLADE) ×3 IMPLANT
CAPT HIP TOTAL 2 ×3 IMPLANT
CLOSURE WOUND 1/2 X4 (GAUZE/BANDAGES/DRESSINGS) ×1
CLOTH BEACON ORANGE TIMEOUT ST (SAFETY) ×3 IMPLANT
COVER PERINEAL POST (MISCELLANEOUS) ×3 IMPLANT
COVER SURGICAL LIGHT HANDLE (MISCELLANEOUS) ×3 IMPLANT
DECANTER SPIKE VIAL GLASS SM (MISCELLANEOUS) ×3 IMPLANT
DRAPE STERI IOBAN 125X83 (DRAPES) ×3 IMPLANT
DRAPE U-SHAPE 47X51 STRL (DRAPES) ×6 IMPLANT
DRSG ADAPTIC 3X8 NADH LF (GAUZE/BANDAGES/DRESSINGS) ×3 IMPLANT
DRSG MEPILEX BORDER 4X4 (GAUZE/BANDAGES/DRESSINGS) ×3 IMPLANT
DRSG MEPILEX BORDER 4X8 (GAUZE/BANDAGES/DRESSINGS) ×3 IMPLANT
DURAPREP 26ML APPLICATOR (WOUND CARE) ×3 IMPLANT
ELECT REM PT RETURN 15FT ADLT (MISCELLANEOUS) ×3 IMPLANT
EVACUATOR 1/8 PVC DRAIN (DRAIN) ×3 IMPLANT
GLOVE BIO SURGEON STRL SZ7.5 (GLOVE) ×3 IMPLANT
GLOVE BIO SURGEON STRL SZ8 (GLOVE) ×6 IMPLANT
GLOVE BIOGEL PI IND STRL 8 (GLOVE) ×2 IMPLANT
GLOVE BIOGEL PI INDICATOR 8 (GLOVE) ×4
GOWN STRL REUS W/TWL LRG LVL3 (GOWN DISPOSABLE) ×3 IMPLANT
GOWN STRL REUS W/TWL XL LVL3 (GOWN DISPOSABLE) ×3 IMPLANT
PACK ANTERIOR HIP CUSTOM (KITS) ×3 IMPLANT
STRIP CLOSURE SKIN 1/2X4 (GAUZE/BANDAGES/DRESSINGS) ×2 IMPLANT
SUT ETHIBOND NAB CT1 #1 30IN (SUTURE) ×3 IMPLANT
SUT MNCRL AB 4-0 PS2 18 (SUTURE) ×3 IMPLANT
SUT STRATAFIX 0 PDS 27 VIOLET (SUTURE) ×3
SUT VIC AB 2-0 CT1 27 (SUTURE) ×6
SUT VIC AB 2-0 CT1 TAPERPNT 27 (SUTURE) ×2 IMPLANT
SUTURE STRATFX 0 PDS 27 VIOLET (SUTURE) ×1 IMPLANT
SYR 50ML LL SCALE MARK (SYRINGE) IMPLANT
TRAY FOLEY W/METER SILVER 16FR (SET/KITS/TRAYS/PACK) ×3 IMPLANT
YANKAUER SUCT BULB TIP 10FT TU (MISCELLANEOUS) ×3 IMPLANT

## 2017-05-10 NOTE — Anesthesia Postprocedure Evaluation (Signed)
Anesthesia Post Note  Patient: Meredith Moore  Procedure(s) Performed: LEFT TOTAL HIP ARTHROPLASTY ANTERIOR APPROACH (Left Hip)     Patient location during evaluation: PACU Anesthesia Type: MAC and Spinal Level of consciousness: awake and alert Pain management: pain level controlled Vital Signs Assessment: post-procedure vital signs reviewed and stable Respiratory status: spontaneous breathing, nonlabored ventilation, respiratory function stable and patient connected to nasal cannula oxygen Cardiovascular status: stable and blood pressure returned to baseline Postop Assessment: no apparent nausea or vomiting and spinal receding Anesthetic complications: no    Last Vitals:  Vitals:   05/10/17 1130 05/10/17 1149  BP: (!) 115/59 121/60  Pulse: 65 66  Resp: 13 16  Temp: (!) 36.4 C 36.4 C  SpO2: 100% 100%    Last Pain:  Vitals:   05/10/17 1130  TempSrc:   PainSc: Asleep                 Daina Cara

## 2017-05-10 NOTE — Evaluation (Signed)
Physical Therapy Evaluation Patient Details Name: Meredith Moore MRN: [id number] DOB: [birth year] Today's Date: 05/10/2017   History of Present Illness  Pt is a 81 year old female s/p L direct anterior THA with PMHx of R THA 2015 and blindness  Clinical Impression  Pt is s/p THA resulting in the deficits listed below (see PT Problem List).  Pt will benefit from skilled PT to increase their independence and safety with mobility to allow discharge to the venue listed below.  Pt assisted with ambulating short distance POD #0 and plans to d/c home with spouse with HHPT.       Follow Up Recommendations Home health PT    Equipment Recommendations  None recommended by PT    Recommendations for Other Services       Precautions / Restrictions Precautions Precautions: Fall Restrictions Other Position/Activity Restrictions: WBAT      Mobility  Bed Mobility Overal bed mobility: Needs Assistance Bed Mobility: Supine to Sit     Supine to sit: Min guard;HOB elevated     General bed mobility comments: pt self assisted L LE using UEs  Transfers Overall transfer level: Needs assistance Equipment used: Rolling walker (2 wheeled) Transfers: Sit to/from Stand Sit to Stand: Min guard         General transfer comment: verbal cues for hand placement and environment (blindness)  Ambulation/Gait Ambulation/Gait assistance: Min guard Ambulation Distance (Feet): 50 Feet Assistive device: Rolling walker (2 wheeled) Gait Pattern/deviations: Step-through pattern;Decreased stride length;Decreased stance time - left;Antalgic     General Gait Details: verbal cues for sequence, RW positioning, and environment  Stairs            Wheelchair Mobility    Modified Rankin (Stroke Patients Only)       Balance                                             Pertinent Vitals/Pain Pain Assessment: 0-10 Pain Score: 2  Pain Location: L hip Pain Descriptors /  Indicators: Aching;Sore Pain Intervention(s): Limited activity within patient's tolerance;Monitored during session;Repositioned;Ice applied    Home Living Family/patient expects to be discharged to:: Private residence Living Arrangements: Spouse/significant other   Type of Home: House Home Access: Stairs to enter Entrance Stairs-Rails: Right Entrance Stairs-Number of Steps: 4 Home Layout: Two level Home Equipment: Environmental consultant - 2 wheels;Cane - single point      Prior Function Level of Independence: Independent               Hand Dominance        Extremity/Trunk Assessment        Lower Extremity Assessment Lower Extremity Assessment: LLE deficits/detail LLE Deficits / Details: anticipated post op hip weakness, able to perform ankle pump, good quad contraction       Communication   Communication: No difficulties  Cognition Arousal/Alertness: Awake/alert Behavior During Therapy: WFL for tasks assessed/performed Overall Cognitive Status: Within Functional Limits for tasks assessed                                        General Comments      Exercises     Assessment/Plan    PT Assessment Patient needs continued PT services  PT Problem List Decreased strength;Decreased mobility;Decreased activity tolerance;Pain  PT Treatment Interventions Functional mobility training;Stair training;Gait training;DME instruction;Therapeutic exercise;Patient/family education;Therapeutic activities    PT Goals (Current goals can be found in the Care Plan section)  Acute Rehab PT Goals PT Goal Formulation: With patient Time For Goal Achievement: 05/13/17 Potential to Achieve Goals: Good    Frequency 7X/week   Barriers to discharge        Co-evaluation               AM-PAC PT "6 Clicks" Daily Activity  Outcome Measure Difficulty turning over in bed (including adjusting bedclothes, sheets and blankets)?: None Difficulty moving from lying on back  to sitting on the side of the bed? : A Little Difficulty sitting down on and standing up from a chair with arms (e.g., wheelchair, bedside commode, etc,.)?: A Little Help needed moving to and from a bed to chair (including a wheelchair)?: A Little Help needed walking in hospital room?: A Little Help needed climbing 3-5 steps with a railing? : A Lot 6 Click Score: 18    End of Session Equipment Utilized During Treatment: Gait belt Activity Tolerance: Patient tolerated treatment well Patient left: in chair;with call bell/phone within reach;with chair alarm set Nurse Communication: Mobility status PT Visit Diagnosis: Difficulty in walking, not elsewhere classified (R26.2)    Time: 1027-2536 PT Time Calculation (min) (ACUTE ONLY): 16 min   Charges:   PT Evaluation $PT Eval Low Complexity: 1 Low     PT G CodesCarmelia Bake, PT, DPT 05/10/2017 Pager: [phone]  York Ram E 05/10/2017, 3:33 PM

## 2017-05-10 NOTE — Anesthesia Procedure Notes (Signed)
Spinal  Patient location during procedure: OR End time: 05/10/2017 8:27 AM Staffing Resident/CRNA: Noralyn Pick D Performed: anesthesiologist and resident/CRNA  Preanesthetic Checklist Completed: patient identified, site marked, surgical consent, pre-op evaluation, timeout performed, IV checked, risks and benefits discussed and monitors and equipment checked Spinal Block Patient position: sitting Prep: Betadine Patient monitoring: heart rate, continuous pulse ox and blood pressure Approach: midline Location: L2-3 Injection technique: single-shot Needle Needle type: Sprotte  Needle gauge: 24 G Needle length: 9 cm Additional Notes Expiration date of kit checked and confirmed. Patient tolerated procedure well, without complications.

## 2017-05-10 NOTE — Transfer of Care (Signed)
Immediate Anesthesia Transfer of Care Note  Patient: Meredith Moore  Procedure(s) Performed: LEFT TOTAL HIP ARTHROPLASTY ANTERIOR APPROACH (Left Hip)  Patient Location: PACU  Anesthesia Type:Spinal  Level of Consciousness: awake, alert  and oriented  Airway & Oxygen Therapy: Patient Spontanous Breathing and Patient connected to face mask oxygen  Post-op Assessment: Report given to RN and Post -op Vital signs reviewed and stable  Post vital signs: Reviewed and stable  Last Vitals:  Vitals:   05/10/17 0617  BP: 121/81  Pulse: 77  Resp: 18  Temp: 36.7 C  SpO2: 100%    Last Pain:  Vitals:   05/10/17 0617  TempSrc: Oral      Patients Stated Pain Goal: 4 (27/06/23 7628)  Complications: No apparent anesthesia complications

## 2017-05-10 NOTE — Interval H&P Note (Signed)
History and Physical Interval Note:  05/10/2017 7:56 AM  Meredith Moore  has presented today for surgery, with the diagnosis of Left hip osteoarthritis  The various methods of treatment have been discussed with the patient and family. After consideration of risks, benefits and other options for treatment, the patient has consented to  Procedure(s) with comments: LEFT TOTAL HIP ARTHROPLASTY ANTERIOR APPROACH (Left) - 90 mins as a surgical intervention .  The patient's history has been reviewed, patient examined, no change in status, stable for surgery.  I have reviewed the patient's chart and labs.  Questions were answered to the patient's satisfaction.     Gearlean Alf

## 2017-05-10 NOTE — Op Note (Signed)
OPERATIVE REPORT- TOTAL HIP ARTHROPLASTY   PREOPERATIVE DIAGNOSIS: Osteoarthritis of the Left hip.   POSTOPERATIVE DIAGNOSIS: Osteoarthritis of the Left  hip.   PROCEDURE: Left total hip arthroplasty, anterior approach.   SURGEON: Gaynelle Arabian, MD   ASSISTANT: Arlee Muslim, PA-C  ANESTHESIA:  Spinal  ESTIMATED BLOOD LOSS:-350 ml   DRAINS: Hemovac x1.   COMPLICATIONS: None   CONDITION: PACU - hemodynamically stable.   BRIEF CLINICAL NOTE: Meredith Moore is a 81 y.o. female who has advanced end-  stage arthritis of their Left  hip with progressively worsening pain and  dysfunction.The patient has failed nonoperative management and presents for  total hip arthroplasty.   PROCEDURE IN DETAIL: After successful administration of spinal  anesthetic, the traction boots for the [facility] bed were placed on both  feet and the patient was placed onto the [facility] bed, boots placed into the leg  holders. The Left hip was then isolated from the perineum with plastic  drapes and prepped and draped in the usual sterile fashion. ASIS and  greater trochanter were marked and a oblique incision was made, starting  at about 1 cm lateral and 2 cm distal to the ASIS and coursing towards  the anterior cortex of the femur. The skin was cut with a 10 blade  through subcutaneous tissue to the level of the fascia overlying the  tensor fascia lata muscle. The fascia was then incised in line with the  incision at the junction of the anterior third and posterior 2/3rd. The  muscle was teased off the fascia and then the interval between the TFL  and the rectus was developed. The Hohmann retractor was then placed at  the top of the femoral neck over the capsule. The vessels overlying the  capsule were cauterized and the fat on top of the capsule was removed.  A Hohmann retractor was then placed anterior underneath the rectus  femoris to give exposure to the entire anterior capsule. A T-shaped   capsulotomy was performed. The edges were tagged and the femoral head  was identified.       Osteophytes are removed off the superior acetabulum.  The femoral neck was then cut in situ with an oscillating saw. Traction  was then applied to the left lower extremity utilizing the [facility]  traction. The femoral head was then removed. Retractors were placed  around the acetabulum and then circumferential removal of the labrum was  performed. Osteophytes were also removed. Reaming starts at 43 mm to  medialize and  Increased in 2 mm increments to 47 mm. We reamed in  approximately 40 degrees of abduction, 20 degrees anteversion. A 48 mm  pinnacle acetabular shell was then impacted in anatomic position under  fluoroscopic guidance with excellent purchase. We did not need to place  any additional dome screws. A 28 mm neutral + 4 marathon liner was then  placed into the acetabular shell.       The femoral lift was then placed along the lateral aspect of the femur  just distal to the vastus ridge. The leg was  externally rotated and capsule  was stripped off the inferior aspect of the femoral neck down to the  level of the lesser trochanter, this was done with electrocautery. The femur was lifted after this was performed. The  leg was then placed in an extended and adducted position essentially delivering the femur. We also removed the capsule superiorly and the piriformis from the piriformis fossa  to gain excellent exposure of the  proximal femur. Rongeur was used to remove some cancellous bone to get  into the lateral portion of the proximal femur for placement of the  initial starter reamer. The starter broaches was placed  the starter broach  and was shown to go down the center of the canal. Broaching  with the  Corail system was then performed starting at size 8, coursing  Up to size 11. A size 11 had excellent torsional and rotational  and axial stability. The trial high offset neck was then  placed  with a 28 + 1.5 trial head. The hip was then reduced. We confirmed that  the stem was in the canal both on AP and lateral x-rays. It also has excellent sizing. The hip was reduced with outstanding stability through full extension and full external rotation.. AP pelvis was taken and the leg lengths were measured and found to be equal. Hip was then dislocated again and the femoral head and neck removed. The  femoral broach was removed. Size 11 Corail stem with a high offset  neck was then impacted into the femur following native anteversion. Has  excellent purchase in the canal. Excellent torsional and rotational and  axial stability. It is confirmed to be in the canal on AP and lateral  fluoroscopic views. The 28 + 1.5 ceramic head was placed and the hip  reduced with outstanding stability. Again AP pelvis was taken and it  confirmed that the leg lengths were equal. The wound was then copiously  irrigated with saline solution and the capsule reattached and repaired  with Ethibond suture. 30 ml of .25% Bupivicaine was  injected into the capsule and into the edge of the tensor fascia lata as well as subcutaneous tissue. The fascia overlying the tensor fascia lata was then closed with a running #1 V-Loc. Subcu was closed with interrupted 2-0 Vicryl and subcuticular running 4-0 Monocryl. Incision was cleaned  and dried. Steri-Strips and a bulky sterile dressing applied. Hemovac  drain was hooked to suction and then the patient was awakened and transported to  recovery in stable condition.        Please note that a surgical assistant was a medical necessity for this procedure to perform it in a safe and expeditious manner. Assistant was necessary to provide appropriate retraction of vital neurovascular structures and to prevent femoral fracture and allow for anatomic placement of the prosthesis.  Gaynelle Arabian, M.D.

## 2017-05-10 NOTE — Discharge Instructions (Addendum)
° °Dr. Frank Aluisio °Total Joint Specialist °[facility] °[address] °[place], [place] [address] °[phone] ° °ANTERIOR APPROACH TOTAL HIP REPLACEMENT POSTOPERATIVE DIRECTIONS ° ° °Hip Rehabilitation, Guidelines Following Surgery  °The results of a hip operation are greatly improved after range of motion and muscle strengthening exercises. Follow all safety measures which are given to protect your hip. If any of these exercises cause increased pain or swelling in your joint, decrease the amount until you are comfortable again. Then slowly increase the exercises. Call your caregiver if you have problems or questions.  ° °HOME CARE INSTRUCTIONS  °Remove items at home which could result in a fall. This includes throw rugs or furniture in walking pathways.  °· ICE to the affected hip every three hours for 30 minutes at a time and then as needed for pain and swelling.  Continue to use ice on the hip for pain and swelling from surgery. You may notice swelling that will progress down to the foot and ankle.  This is normal after surgery.  Elevate the leg when you are not up walking on it.   °· Continue to use the breathing machine which will help keep your temperature down.  It is common for your temperature to cycle up and down following surgery, especially at night when you are not up moving around and exerting yourself.  The breathing machine keeps your lungs expanded and your temperature down. ° ° °DIET °You may resume your previous home diet once your are discharged from the hospital. ° °DRESSING / WOUND CARE / SHOWERING °You may shower 3 days after surgery, but keep the wounds dry during showering.  You may use an occlusive plastic wrap (Press'n Seal for example), NO SOAKING/SUBMERGING IN THE BATHTUB.  If the bandage gets wet, change with a clean dry gauze.  If the incision gets wet, pat the wound dry with a clean towel. °You may start showering once you are discharged home but do not  submerge the incision under water. Just pat the incision dry and apply a dry gauze dressing on daily. °Change the surgical dressing daily and reapply a dry dressing each time. ° °ACTIVITY °Walk with your walker as instructed. °Use walker as long as suggested by your caregivers. °Avoid periods of inactivity such as sitting longer than an hour when not asleep. This helps prevent blood clots.  °You may resume a sexual relationship in one month or when given the OK by your doctor.  °You may return to work once you are cleared by your doctor.  °Do not drive a car for 6 weeks or until released by you surgeon.  °Do not drive while taking narcotics. ° °WEIGHT BEARING °Weight bearing as tolerated with assist device (walker, cane, etc) as directed, use it as long as suggested by your surgeon or therapist, typically at least 4-6 weeks. ° °POSTOPERATIVE CONSTIPATION PROTOCOL °Constipation - defined medically as fewer than three stools per week and severe constipation as less than one stool per week. ° °One of the most common issues patients have following surgery is constipation.  Even if you have a regular bowel pattern at home, your normal regimen is likely to be disrupted due to multiple reasons following surgery.  Combination of anesthesia, postoperative narcotics, change in appetite and fluid intake all can affect your bowels.  In order to avoid complications following surgery, here are some recommendations in order to help you during your recovery period. ° °Colace (docusate) - Pick up an over-the-counter   form of Colace or another stool softener and take twice a day as long as you are requiring postoperative pain medications.  Take with a full glass of water daily.  If you experience loose stools or diarrhea, hold the colace until you stool forms back up.  If your symptoms do not get better within 1 week or if they get worse, check with your doctor. ° °Dulcolax (bisacodyl) - Pick up over-the-counter and take as directed  by the product packaging as needed to assist with the movement of your bowels.  Take with a full glass of water.  Use this product as needed if not relieved by Colace only.  ° °MiraLax (polyethylene glycol) - Pick up over-the-counter to have on hand.  MiraLax is a solution that will increase the amount of water in your bowels to assist with bowel movements.  Take as directed and can mix with a glass of water, juice, soda, coffee, or tea.  Take if you go more than two days without a movement. °Do not use MiraLax more than once per day. Call your doctor if you are still constipated or irregular after using this medication for 7 days in a row. ° °If you continue to have problems with postoperative constipation, please contact the office for further assistance and recommendations.  If you experience "the worst abdominal pain ever" or develop nausea or vomiting, please contact the office immediatly for further recommendations for treatment. ° °ITCHING ° If you experience itching with your medications, try taking only a single pain pill, or even half a pain pill at a time.  You can also use Benadryl over the counter for itching or also to help with sleep.  ° °TED HOSE STOCKINGS °Wear the elastic stockings on both legs for three weeks following surgery during the day but you may remove then at night for sleeping. ° °MEDICATIONS °See your medication summary on the “After Visit Summary” that the nursing staff will review with you prior to discharge.  You may have some home medications which will be placed on hold until you complete the course of blood thinner medication.  It is important for you to complete the blood thinner medication as prescribed by your surgeon.  Continue your approved medications as instructed at time of discharge. ° °PRECAUTIONS °If you experience chest pain or shortness of breath - call 911 immediately for transfer to the hospital emergency department.  °If you develop a fever greater that 101 F,  purulent drainage from wound, increased redness or drainage from wound, foul odor from the wound/dressing, or calf pain - CONTACT YOUR SURGEON.   °                                                °FOLLOW-UP APPOINTMENTS °Make sure you keep all of your appointments after your operation with your surgeon and caregivers. You should call the office at the above phone number and make an appointment for approximately two weeks after the date of your surgery or on the date instructed by your surgeon outlined in the "After Visit Summary". ° °RANGE OF MOTION AND STRENGTHENING EXERCISES  °These exercises are designed to help you keep full movement of your hip joint. Follow your caregiver's or physical therapist's instructions. Perform all exercises about fifteen times, three times per day or as directed. Exercise both hips, even if you   have had only one joint replacement. These exercises can be done on a training (exercise) mat, on the floor, on a table or on a bed. Use whatever works the best and is most comfortable for you. Use music or television while you are exercising so that the exercises are a pleasant break in your day. This will make your life better with the exercises acting as a break in routine you can look forward to.  Lying on your back, slowly slide your foot toward your buttocks, raising your knee up off the floor. Then slowly slide your foot back down until your leg is straight again.  Lying on your back spread your legs as far apart as you can without causing discomfort.  Lying on your side, raise your upper leg and foot straight up from the floor as far as is comfortable. Slowly lower the leg and repeat.  Lying on your back, tighten up the muscle in the front of your thigh (quadriceps muscles). You can do this by keeping your leg straight and trying to raise your heel off the floor. This helps strengthen the largest muscle supporting your knee.  Lying on your back, tighten up the muscles of your  buttocks both with the legs straight and with the knee bent at a comfortable angle while keeping your heel on the floor.   IF YOU ARE TRANSFERRED TO A SKILLED REHAB FACILITY If the patient is transferred to a skilled rehab facility following release from the hospital, a list of the current medications will be sent to the facility for the patient to continue.  When discharged from the skilled rehab facility, please have the facility set up the patient's [place] prior to being released. Also, the skilled facility will be responsible for providing the patient with their medications at time of release from the facility to include their pain medication, the muscle relaxants, and their blood thinner medication. If the patient is still at the rehab facility at time of the two week follow up appointment, the skilled rehab facility will also need to assist the patient in arranging follow up appointment in our office and any transportation needs.  MAKE SURE YOU:  Understand these instructions.  Get help right away if you are not doing well or get worse.    Pick up stool softner and laxative for home use following surgery while on pain medications. Do not submerge incision under water. Please use good hand washing techniques while changing dressing each day. May shower starting three days after surgery. Please use a clean towel to pat the incision dry following showers. Continue to use ice for pain and swelling after surgery. Do not use any lotions or creams on the incision until instructed by your surgeon.   Take Eliquis twice a day for three weeks, then switch to a baby 81 mg Aspirin daily for three additional weeks.  Information on my medicine - ELIQUIS (apixaban)  This medication education was reviewed with me or my healthcare representative as part of my discharge preparation.  The pharmacist that spoke with me during my hospital stay was:    Why was Eliquis prescribed for  you? Eliquis was prescribed for you to reduce the risk of blood clots forming after orthopedic surgery.    What do You need to know about Eliquis? Take your Eliquis TWICE DAILY - one tablet in the morning and one tablet in the evening with or without food.  It would be best to take the  dose about the same time each day.  If you have difficulty swallowing the tablet whole please discuss with your pharmacist how to take the medication safely.  Take Eliquis exactly as prescribed by your doctor and DO NOT stop taking Eliquis without talking to the doctor who prescribed the medication.  Stopping without other medication to take the place of Eliquis may increase your risk of developing a clot.  After discharge, you should have regular check-up appointments with your healthcare provider that is prescribing your Eliquis.  What do you do if you miss a dose? If a dose of ELIQUIS is not taken at the scheduled time, take it as soon as possible on the same day and twice-daily administration should be resumed.  The dose should not be doubled to make up for a missed dose.  Do not take more than one tablet of ELIQUIS at the same time.  Important Safety Information A possible side effect of Eliquis is bleeding. You should call your healthcare provider right away if you experience any of the following: ? Bleeding from an injury or your nose that does not stop. ? Unusual colored urine (red or dark brown) or unusual colored stools (red or black). ? Unusual bruising for unknown reasons. ? A serious fall or if you hit your head (even if there is no bleeding).  Some medicines may interact with Eliquis and might increase your risk of bleeding or clotting while on Eliquis. To help avoid this, consult your healthcare provider or pharmacist prior to using any new prescription or non-prescription medications, including herbals, vitamins, non-steroidal anti-inflammatory drugs (NSAIDs) and supplements.  This  website has more information on Eliquis (apixaban): [email or web address]

## 2017-05-10 NOTE — Anesthesia Preprocedure Evaluation (Signed)
Anesthesia Evaluation  Patient identified by MRN, date of birth, ID band Patient awake    Reviewed: Allergy & Precautions, NPO status , Patient's Chart, lab work & pertinent test results  History of Anesthesia Complications (+) PONV and history of anesthetic complications  Airway Mallampati: II  TM Distance: >3 FB Neck ROM: Full    Dental  (+) Teeth Intact   Pulmonary neg shortness of breath, neg sleep apnea, neg COPD, neg recent URI, former smoker,    breath sounds clear to auscultation       Cardiovascular negative cardio ROS   Rhythm:Regular     Neuro/Psych  Headaches, neg Seizures negative psych ROS   GI/Hepatic negative GI ROS, Neg liver ROS,   Endo/Other  negative endocrine ROS  Renal/GU negative Renal ROS     Musculoskeletal  (+) Arthritis ,   Abdominal   Peds  Hematology  (+) anemia ,   Anesthesia Other Findings   Reproductive/Obstetrics                             Anesthesia Physical Anesthesia Plan  ASA: II  Anesthesia Plan: MAC and Spinal   Post-op Pain Management:    Induction:   PONV Risk Score and Plan: 3 and Ondansetron, Dexamethasone and Treatment may vary due to age or medical condition  Airway Management Planned: Nasal Cannula  Additional Equipment: None  Intra-op Plan:   Post-operative Plan:   Informed Consent: I have reviewed the patients History and Physical, chart, labs and discussed the procedure including the risks, benefits and alternatives for the proposed anesthesia with the patient or authorized representative who has indicated his/her understanding and acceptance.   Dental advisory given  Plan Discussed with: CRNA and Surgeon  Anesthesia Plan Comments:         Anesthesia Quick Evaluation

## 2017-05-10 NOTE — H&P (View-Only) (Signed)
PATIENT IS BLIND  Meredith Moore DOB: 1936/07/23 Married / Language: English / Race: White Female Date of Admission:  05/10/2017 CC:  Left Hip Pain History of Present Illness  The patient is a 81 year old female who comes in for a preoperative History and Physical. The patient is scheduled for a left total hip arthroplasty (anterior) to be performed by Dr. Dione Plover. Aluisio, MD at [facility] on 05/10/2017. The patient is a 81 year old female who presented for follow up of their hip. The patient is being followed for their left hip pain and osteoarthritis. They are months out from intra-articular injection. Symptoms reported include: pain, aching, stiffness and difficulty ambulating. The patient feels that they are doing poorly. Current treatment includes: activity modification. The following medication has been used for pain control: antiinflammatory medication (ibuprofen). The patient has reported improvement of their symptoms with: Cortisone injections. She states that she had some fluid in her hip, and Dr. Nelva Bush aspirated after the injection. She states that the left knee is bothering her too. She has had some intermittent knee pain, and states that she has a grinding sensation in her knee. She does get pain from her left groin down to the knee. She is having some increased back pain as well. She says that she cannot continue like she is and now has reached a point where she would like to proceed with surgery. She has had a very successful RIGHT total hip. She said from pain standpoint this hip is giving her a lot of trouble but even worse of the functional problems. She is at a stage now where she is ready to get the LEFT hip fixed. They have been treated conservatively in the past for the above stated problem and despite conservative measures, they continue to have progressive pain and severe functional limitations and dysfunction. They have failed non-operative management including  home exercise, medications, and injections. It is felt that they would benefit from undergoing total joint replacement. Risks and benefits of the procedure have been discussed with the patient and they elect to proceed with surgery. There are no active contraindications to surgery such as ongoing infection or rapidly progressive neurological disease.  Please note that the patient is blind but does read braille.   Problem List/Past Medical Acute pain of right hip (M25.551)  Aftercare following right hip joint replacement surgery (Z47.1)  Primary osteoarthritis of one knee (M17.10)  Status post total right knee replacement (D32.202)  Impingement syndrome of left shoulder (M75.42)  Primary osteoarthritis of left hip (M16.12)  Status post hip replacement, right (R42.706)  Hip Dysplasia  Hypercholesterolemia  Osteoarthritis  Blindness  Does read braile Glaucoma  Herpes Zoster  Macular Degeneration  Osteoporosis  Chronic kidney disease  Anemia   Allergies  TraMADol HCl *ANALGESICS - OPIOID*  Nickel  Formaldehyde *ANTISEPTICS & DISINFECTANTS*  Restoril *HYPNOTICS*  worsens symptoms of glaucoma  Family History Cancer  mother, brother and grandmother fathers side Cerebrovascular Accident  mother and grandmother mothers side Heart Disease  father and grandfather fathers side Kidney disease  grandmother mothers side Osteoarthritis  First Degree Relatives. mother Father  Deceased. age 71 Mother  Deceased. age 51  Social History  Number of flights of stairs before winded  2-3 Marital status  married Living situation  live with spouse Tobacco / smoke exposure  yes outdoors only Tobacco use  Former smoker. former smoker; smoke(d) less than 1/2 pack(s) per day/has not smoked in over 20 years Pain Contract  no Illicit drug use  no Current work status  retired Children  3 Alcohol use  former drinker Exercise  Exercises weekly; does gym /  [employer] Drug/Alcohol Rehab (Previously)  no Drug/Alcohol Rehab (Currently)  no  Medication History  Atorvastatin Calcium (10MG  Tablet, 5 mg Oral, Taken starting 06/04/2014) Active. Lutein Vision Blend (Oral) Active. Womens Laxative (Oral) Specific strength unknown - Active. Eye Vitamins (Oral) Active. Xalatan (0.005% Solution, Ophthalmic) Active. Timolol Maleate (0.5% Solution, Ophthalmic) Active. Simbrinza (1-0.2% Suspension, Ophthalmic) Active. Ibuprofen (200MG  Tablet, Oral) Active. QUEtiapine Fumarate (25MG  Tablet, 12.5 MG QHS Oral) Active. Dorzolamide HCl (2% Solution, Ophthalmic) Active. Aspirin EC Low Strength (81MG  Tablet DR, Oral) Active. Tylenol (500MG  Capsule, Oral) Active.  Past Surgical History  Appendectomy  Date: 1979. had a benign tumor removed from the right ovary at the same time Cataract Surgery  bilateral Tonsillectomy  Date: 1945. Belpharoptosis Repair  Date: 20. Robotic Assisted Total Hysterectomy with BSO  Date: 03/2014. Total Hip Replacement - Right  Date: 06/2014. Total Knee Replacement - Right  Date: 09/2012.   Review of Systems General Not Present- Chills, Fatigue, Fever, Memory Loss, Night Sweats, Weight Gain and Weight Loss. Skin Not Present- Eczema, Hives, Itching, Lesions and Rash. HEENT Present- Blurred Vision, Color Blindness, Double Vision, Glaucoma, Vision problems and Visual Loss (Patient is blind). Not Present- Dentures, Headache, Hearing Loss and Tinnitus. Respiratory Not Present- Allergies, Chronic Cough, Coughing up blood, Shortness of breath at rest and Shortness of breath with exertion. Cardiovascular Not Present- Chest Pain, Difficulty Breathing Lying Down, Murmur, Palpitations, Racing/skipping heartbeats and Swelling. Gastrointestinal Not Present- Abdominal Pain, Bloody Stool, Constipation, Diarrhea, Difficulty Swallowing, Heartburn, Jaundice, Loss of appetitie, Nausea and Vomiting. Female Genitourinary Not Present-  Blood in Urine, Discharge, Flank Pain, Incontinence, Painful Urination, Urgency, Urinary frequency, Urinary Retention, Urinating at Night and Weak urinary stream. Musculoskeletal Not Present- Back Pain, Joint Pain, Joint Swelling, Morning Stiffness, Muscle Pain, Muscle Weakness and Spasms. Neurological Not Present- Blackout spells, Difficulty with balance, Dizziness, Paralysis, Tremor and Weakness. Psychiatric Not Present- Insomnia.  Vitals  Weight: 132 lb Height: 64in Weight was reported by patient. Height was reported by patient. Body Surface Area: 1.64 m Body Mass Index: 22.66 kg/m  Pulse: 64 (Regular)  BP: 132/78 (Sitting, Left Arm, Standard)  Physical Exam  General Mental Status -Alert, cooperative and good historian. General Appearance-pleasant, Not in acute distress. Orientation-Oriented X3. Build & Nutrition-Well nourished and Well developed.  Head and Neck Head-normocephalic, atraumatic . Neck Global Assessment - supple, no bruit auscultated on the right, no bruit auscultated on the left.  Eye Vision-Decreased(both eyes secondary to severe glaucoma and macular degeneration). Pupil - Bilateral-Regular and Round. Motion - Bilateral-EOMI.  Chest and Lung Exam Auscultation Breath sounds - clear at anterior chest wall and clear at posterior chest wall. Adventitious sounds - No Adventitious sounds.  Cardiovascular Auscultation Rhythm - Regular rate and rhythm. Heart Sounds - S1 WNL and S2 WNL. Murmurs & Other Heart Sounds - Auscultation of the heart reveals - No Murmurs.  Abdomen Palpation/Percussion Tenderness - Abdomen is non-tender to palpation. Rigidity (guarding) - Abdomen is soft. Auscultation Auscultation of the abdomen reveals - Bowel sounds normal.  Female Genitourinary Note: Not done, not pertinent to present illness   Musculoskeletal Note: Well-developed female alert and oriented in no apparent distress. Her LEFT hip can be  flexed to 100 minimal internal rotation about 20 external rotation 20 abduction. There is pain on range of motion of that hip. Her RIGHT hip has excellent motion without discomfort.  Her x-rays are reviewed again she has got an element of bone-on-bone arthritis in that LEFT hip.     Assessment & Plan Status post hip replacement, right (Y05.110) Primary osteoarthritis of left hip (M16.12)  Note:Surgical Plans: Left Total Hip Replacement - Anterior Approach  Disposition: Home with husband, Start HHPT Has walker and raised toilet seat at home  PCP: [facility]  IV TXA  Anesthesia Issues: None except for panic attack following hysterectomy surgery.  Patient was instructed on what medications to stop prior to surgery.  Sent Eliquis in prior to surgery to [facility] due to renal insuff. and the patient needed prior authorization on her previous surgery for the Xarelto.  Please note that the patient is blind so please remind all staff to introduce themselves when coming into the room. She does read braille so please provide braille literature when possible.  Signed electronically by Joelene Millin, III PA-C

## 2017-05-11 LAB — CBC
HCT: 25.6 % — ABNORMAL LOW (ref 36.0–46.0)
HEMOGLOBIN: 8.6 g/dL — AB (ref 12.0–15.0)
MCH: 31.2 pg (ref 26.0–34.0)
MCHC: 33.6 g/dL (ref 30.0–36.0)
MCV: 92.8 fL (ref 78.0–100.0)
PLATELETS: 223 10*3/uL (ref 150–400)
RBC: 2.76 MIL/uL — AB (ref 3.87–5.11)
RDW: 13.7 % (ref 11.5–15.5)
WBC: 11.4 10*3/uL — ABNORMAL HIGH (ref 4.0–10.5)

## 2017-05-11 LAB — BASIC METABOLIC PANEL
ANION GAP: 8 (ref 5–15)
BUN: 12 mg/dL (ref 6–20)
CHLORIDE: 106 mmol/L (ref 101–111)
CO2: 21 mmol/L — ABNORMAL LOW (ref 22–32)
Calcium: 8.1 mg/dL — ABNORMAL LOW (ref 8.9–10.3)
Creatinine, Ser: 0.73 mg/dL (ref 0.44–1.00)
GFR calc Af Amer: >60 mL/min (ref >60)
Glucose, Bld: 129 mg/dL — ABNORMAL HIGH (ref 65–99)
POTASSIUM: 4.1 mmol/L (ref 3.5–5.1)
SODIUM: 135 mmol/L (ref 135–145)

## 2017-05-11 MED ORDER — BRINZOLAMIDE 1 % OP SUSP
1.0000 [drp] | Freq: Three times a day (TID) | OPHTHALMIC | Status: DC
  Administered 2017-05-11: 1 [drp] via OPHTHALMIC
  Filled 2017-05-11: qty 10

## 2017-05-11 MED ORDER — POLYSACCHARIDE IRON COMPLEX 150 MG PO CAPS
150.0000 mg | ORAL_CAPSULE | Freq: Two times a day (BID) | ORAL | Status: DC
  Administered 2017-05-11 – 2017-05-12 (×3): 150 mg via ORAL
  Filled 2017-05-11 (×3): qty 1

## 2017-05-11 MED ORDER — APIXABAN 2.5 MG PO TABS
2.5000 mg | ORAL_TABLET | Freq: Two times a day (BID) | ORAL | Status: DC
  Administered 2017-05-11 – 2017-05-12 (×3): 2.5 mg via ORAL
  Filled 2017-05-11 (×3): qty 1

## 2017-05-11 MED ORDER — BRIMONIDINE TARTRATE 0.2 % OP SOLN
1.0000 [drp] | Freq: Three times a day (TID) | OPHTHALMIC | Status: DC
  Administered 2017-05-11: 1 [drp] via OPHTHALMIC
  Filled 2017-05-11: qty 5

## 2017-05-11 MED ORDER — SODIUM CHLORIDE 0.9 % IV BOLUS (SEPSIS)
250.0000 mL | Freq: Once | INTRAVENOUS | Status: AC
  Administered 2017-05-11: 10:00:00 250 mL via INTRAVENOUS

## 2017-05-11 NOTE — Progress Notes (Signed)
Physical Therapy Treatment Patient Details Name: Meredith Moore MRN: [id number] DOB: Feb 07, 1936 Today's Date: 05/11/2017    History of Present Illness Pt is a 81 year old female s/p L direct anterior THA with PMHx of R THA 2015 and blindness    PT Comments    Pt performed LE exercises and ambulated in hallway.  Pt reports d/c home tomorrow.  Follow Up Recommendations  Home health PT     Equipment Recommendations  None recommended by PT    Recommendations for Other Services       Precautions / Restrictions Precautions Precautions: Fall Restrictions Other Position/Activity Restrictions: WBAT    Mobility  Bed Mobility Overal bed mobility: Needs Assistance Bed Mobility: Supine to Sit     Supine to sit: Min guard;HOB elevated     General bed mobility comments: pt self assisted L LE using UEs  Transfers Overall transfer level: Needs assistance Equipment used: Rolling walker (2 wheeled) Transfers: Sit to/from Stand Sit to Stand: Min guard         General transfer comment: verbal cues for hand placement   Ambulation/Gait Ambulation/Gait assistance: Min guard Ambulation Distance (Feet): 200 Feet Assistive device: Rolling walker (2 wheeled) Gait Pattern/deviations: Step-through pattern;Decreased stride length;Decreased stance time - left;Antalgic     General Gait Details: verbal cues for sequence, heel strike, RW positioning, and environment   Stairs            Wheelchair Mobility    Modified Rankin (Stroke Patients Only)       Balance                                            Cognition Arousal/Alertness: Awake/alert Behavior During Therapy: WFL for tasks assessed/performed Overall Cognitive Status: Within Functional Limits for tasks assessed                                        Exercises Total Joint Exercises Ankle Circles/Pumps: AROM;Both;10 reps Quad Sets: AROM;Both;10 reps Gluteal Sets: 10  reps;AROM;Both Towel Squeeze: AROM;Both;10 reps Heel Slides: AROM;Left;10 reps;Supine Hip ABduction/ADduction: AROM;Left;10 reps;Supine Straight Leg Raises: AAROM;Left;10 reps;Supine Long Arc Quad: AROM;Left;Seated;10 reps    General Comments        Pertinent Vitals/Pain Pain Assessment: 0-10 Pain Score: 2  Pain Location: L hip Pain Descriptors / Indicators: Aching;Sore Pain Intervention(s): Limited activity within patient's tolerance;Repositioned;Monitored during session;Ice applied    Home Living                      Prior Function            PT Goals (current goals can now be found in the care plan section) Progress towards PT goals: Progressing toward goals    Frequency    7X/week      PT Plan Current plan remains appropriate    Co-evaluation              AM-PAC PT "6 Clicks" Daily Activity  Outcome Measure  Difficulty turning over in bed (including adjusting bedclothes, sheets and blankets)?: None Difficulty moving from lying on back to sitting on the side of the bed? : A Little Difficulty sitting down on and standing up from a chair with arms (e.g., wheelchair, bedside commode, etc,.)?: A Little Help needed moving  to and from a bed to chair (including a wheelchair)?: A Little Help needed walking in hospital room?: A Little Help needed climbing 3-5 steps with a railing? : A Little 6 Click Score: 19    End of Session Equipment Utilized During Treatment: Gait belt Activity Tolerance: Patient tolerated treatment well Patient left: in chair;with call bell/phone within reach;with chair alarm set   PT Visit Diagnosis: Difficulty in walking, not elsewhere classified (R26.2)     Time: 5848-3507 PT Time Calculation (min) (ACUTE ONLY): 19 min  Charges:  $Therapeutic Exercise: 8-22 mins                    G Codes:       Meredith Moore, PT, DPT 05/11/2017 Pager: [phone]  York Ram E 05/11/2017, 1:09 PM

## 2017-05-11 NOTE — Progress Notes (Signed)
Physical Therapy Treatment Patient Details Name: Meredith Moore MRN: [id number] DOB: 1936-07-13 Today's Date: 05/11/2017    History of Present Illness Pt is a 81 year old female s/p L direct anterior THA with PMHx of R THA 2015 and blindness    PT Comments    Pt ambulated again in hallway, assisted to bathroom and then performed a couple standing exercises.  Pt progressing very well.    Follow Up Recommendations  Home health PT     Equipment Recommendations  None recommended by PT    Recommendations for Other Services       Precautions / Restrictions Precautions Precautions: Fall Restrictions Other Position/Activity Restrictions: WBAT    Mobility  Bed Mobility Overal bed mobility: Needs Assistance Bed Mobility: Supine to Sit     Supine to sit: Min guard;HOB elevated     General bed mobility comments: pt self assisted L LE using UEs  Transfers Overall transfer level: Needs assistance Equipment used: Rolling walker (2 wheeled) Transfers: Sit to/from Stand Sit to Stand: Min guard         General transfer comment: verbal cues for hand placement   Ambulation/Gait Ambulation/Gait assistance: Min guard Ambulation Distance (Feet): 400 Feet Assistive device: Rolling walker (2 wheeled) Gait Pattern/deviations: Step-through pattern;Decreased stride length;Decreased stance time - left;Antalgic     General Gait Details: verbal cues for sequence, heel strike, RW positioning, and environment   Stairs            Wheelchair Mobility    Modified Rankin (Stroke Patients Only)       Balance                                            Cognition Arousal/Alertness: Awake/alert Behavior During Therapy: WFL for tasks assessed/performed Overall Cognitive Status: Within Functional Limits for tasks assessed                                        Exercises Total Joint Exercises Ankle Circles/Pumps: AROM;Both;10  reps Quad Sets: AROM;Both;10 reps Gluteal Sets: 10 reps;AROM;Both Towel Squeeze: AROM;Both;10 reps Heel Slides: AROM;Left;10 reps;Supine Hip ABduction/ADduction: AROM;Standing;10 reps;Left (standing exercises performed with UE support) Straight Leg Raises: AAROM;Left;10 reps;Supine Long Arc Quad: AROM;Left;Seated;10 reps Knee Flexion: AROM;Standing;Left;10 reps Marching in Standing: AROM;Left;10 reps;Standing Standing Hip Extension: AROM;Left;Standing;10 reps    General Comments        Pertinent Vitals/Pain Pain Assessment: 0-10 Pain Score: 2  Pain Location: L hip Pain Descriptors / Indicators: Aching;Sore Pain Intervention(s): Limited activity within patient's tolerance;Monitored during session;Repositioned    Home Living                      Prior Function            PT Goals (current goals can now be found in the care plan section) Progress towards PT goals: Progressing toward goals    Frequency    7X/week      PT Plan Current plan remains appropriate    Co-evaluation              AM-PAC PT "6 Clicks" Daily Activity  Outcome Measure  Difficulty turning over in bed (including adjusting bedclothes, sheets and blankets)?: None Difficulty moving from lying on back to sitting on the side of  the bed? : A Little Difficulty sitting down on and standing up from a chair with arms (e.g., wheelchair, bedside commode, etc,.)?: A Little Help needed moving to and from a bed to chair (including a wheelchair)?: A Little Help needed walking in hospital room?: A Little Help needed climbing 3-5 steps with a railing? : A Little 6 Click Score: 19    End of Session Equipment Utilized During Treatment: Gait belt Activity Tolerance: Patient tolerated treatment well Patient left: in chair;with call bell/phone within reach;with family/visitor present   PT Visit Diagnosis: Difficulty in walking, not elsewhere classified (R26.2)     Time: 1188-6773 PT Time  Calculation (min) (ACUTE ONLY): 18 min  Charges:  $Gait Training: 8-22 mins                    G Codes:       Carmelia Bake, PT, DPT 05/11/2017 Pager: [phone]   York Ram E 05/11/2017, 3:35 PM

## 2017-05-11 NOTE — Plan of Care (Signed)
Problem: Tissue Perfusion: Goal: Risk factors for ineffective tissue perfusion will decrease Outcome: Progressing SCDs are on,   Problem: Bowel/Gastric: Goal: Will not experience complications related to bowel motility Outcome: Progressing No bowel or gastric complications reported  Problem: Activity: Goal: Will remain free from falls Outcome: Progressing No fall or injury noted, safety precautions maintained  Problem: Physical Regulation: Goal: Postoperative complications will be avoided or minimized Outcome: Progressing No post operative complications noted

## 2017-05-11 NOTE — Progress Notes (Addendum)
   Subjective: 1 Day Post-Op Procedure(s) (LRB): LEFT TOTAL HIP ARTHROPLASTY ANTERIOR APPROACH (Left) Patient reports pain as mild.   Patient seen in rounds by Dr. Wynelle Link. Patient is well, but has had some minor complaints of pain in the hip, requiring pain medications We will start therapy today.  Plan is to go Home after hospital stay.  Objective: Vital signs in last 24 hours: Temp:  [97.2 F (36.2 C)-98.4 F (36.9 C)] 98.2 F (36.8 C) (10/11 0525) Pulse Rate:  [65-80] 72 (10/11 0525) Resp:  [13-18] 16 (10/11 0525) BP: (99-134)/(51-83) 101/51 (10/11 0525) SpO2:  [96 %-100 %] 100 % (10/11 0525) Weight:  [60.3 kg (133 lb)] 60.3 kg (133 lb) (10/10 1200)  Intake/Output from previous day:  Intake/Output Summary (Last 24 hours) at 05/11/17 0832 Last data filed at 05/11/17 0758  Gross per 24 hour  Intake          4308.75 ml  Output             2735 ml  Net          1573.75 ml    Intake/Output this shift: Total I/O In: 360 [P.O.:360] Out: 200 [Urine:200]  Labs:  Recent Labs  05/11/17 0513  HGB 8.6*    Recent Labs  05/11/17 0513  WBC 11.4*  RBC 2.76*  HCT 25.6*  PLT 223    Recent Labs  05/11/17 0513  NA 135  K 4.1  CL 106  CO2 21*  BUN 12  CREATININE 0.73  GLUCOSE 129*  CALCIUM 8.1*   No results for input(s): LABPT, INR in the last 72 hours.  EXAM General - Patient is Alert, Appropriate and Oriented Extremity - Neurovascular intact Sensation intact distally Intact pulses distally Dorsiflexion/Plantar flexion intact Dressing - dressing C/D/I Motor Function - intact, moving foot and toes well on exam.  Hemovac pulled without difficulty.  Past Medical History:  Diagnosis Date  . Anemia   . Arthritis   . 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 Complication of anesthesia    "Blood pressure has gone too low before and effected vision" just with general  . Complication of anesthesia    "woke up in severe panic attack" just with general  .  Glaucoma   . Headache    hx of   . Herpes zoster with unspecified complication   . Hip dysplasia    right side  . History of transfusion 2014   2 units, no reaction-after knee replacement  . Hyperlipidemia    under control  . Macular degeneration   . Osteoarthrosis, unspecified whether generalized or localized, unspecified site   . Right hip pain     Assessment/Plan: 1 Day Post-Op Procedure(s) (LRB): LEFT TOTAL HIP ARTHROPLASTY ANTERIOR APPROACH (Left) Active Problems:   OA (osteoarthritis) of hip  Estimated body mass index is 22.83 kg/m as calculated from the following:   Height as of this encounter: 5\' 4"  (1.626 m).   Weight as of this encounter: 60.3 kg (133 lb). Up with therapy Plan for discharge tomorrow Discharge home with home health  DVT Prophylaxis - Eliquis Weight Bearing As Tolerated left Leg Hemovac Pulled Begin Therapy Switched to Eliquis - problem with insurance with previous surgery on Xarelto and due to CKD.  Arlee Muslim, PA-C Orthopaedic Surgery 05/11/2017, 8:32 AM

## 2017-05-11 NOTE — Care Management Note (Signed)
Case Management Note  Patient Details  Name: ELEXIUS MINAR MRN: [id number] Date of Birth: [birth year]  Subjective/Objective: 81 y/o f admitted w/OA R hip. POD#1 THA. From home w/spouse. Has cane,rw. [facility] already following PTA-rep [facility] aware. Await PT recc.                   Action/Plan:d/c plan home w/HHC.   Expected Discharge Date:                  Expected Discharge Plan:  [place]  In-House Referral:     Discharge planning Services  CM Consult  Post Acute Care Choice:  Durable Medical Equipment (rw,cane,raised toilet seat) Choice offered to:  Patient  DME Arranged:    DME Agency:     HH Arranged:    [place] Agency:     Status of Service:  In process, will continue to follow  If discussed at Long Length of Stay Meetings, dates discussed:    Additional Comments:  Dessa Phi, RN 05/11/2017, 1:06 PM

## 2017-05-12 LAB — CBC
HEMATOCRIT: 25.8 % — AB (ref 36.0–46.0)
HEMOGLOBIN: 8.4 g/dL — AB (ref 12.0–15.0)
MCH: 30.2 pg (ref 26.0–34.0)
MCHC: 32.6 g/dL (ref 30.0–36.0)
MCV: 92.8 fL (ref 78.0–100.0)
PLATELETS: 223 10*3/uL (ref 150–400)
RBC: 2.78 MIL/uL — AB (ref 3.87–5.11)
RDW: 13.6 % (ref 11.5–15.5)
WBC: 11 10*3/uL — ABNORMAL HIGH (ref 4.0–10.5)

## 2017-05-12 LAB — BASIC METABOLIC PANEL
ANION GAP: 7 (ref 5–15)
BUN: 16 mg/dL (ref 6–20)
CO2: 24 mmol/L (ref 22–32)
CREATININE: 0.73 mg/dL (ref 0.44–1.00)
Calcium: 8.4 mg/dL — ABNORMAL LOW (ref 8.9–10.3)
Chloride: 107 mmol/L (ref 101–111)
GLUCOSE: 129 mg/dL — AB (ref 65–99)
Potassium: 4 mmol/L (ref 3.5–5.1)
Sodium: 138 mmol/L (ref 135–145)

## 2017-05-12 MED ORDER — POLYSACCHARIDE IRON COMPLEX 150 MG PO CAPS
150.0000 mg | ORAL_CAPSULE | Freq: Two times a day (BID) | ORAL | 0 refills | Status: DC
Start: 2017-05-12 — End: 2017-06-06

## 2017-05-12 MED ORDER — METHOCARBAMOL 500 MG PO TABS: 500.0000 mg | ORAL_TABLET | Freq: Four times a day (QID) | ORAL | 0 refills | Status: DC | PRN

## 2017-05-12 MED ORDER — OXYCODONE HCL 5 MG PO TABS: 5.0000 mg | ORAL_TABLET | ORAL | 0 refills | Status: DC | PRN

## 2017-05-12 MED ORDER — APIXABAN 2.5 MG PO TABS: 2.5000 mg | ORAL_TABLET | Freq: Two times a day (BID) | ORAL | 0 refills | Status: DC

## 2017-05-12 NOTE — Progress Notes (Signed)
   Subjective: 2 Days Post-Op Procedure(s) (LRB): LEFT TOTAL HIP ARTHROPLASTY ANTERIOR APPROACH (Left) Patient reports pain as mild.   Patient seen in rounds with Dr. Wynelle Link. Patient is well, and has had no acute complaints or problems Patient is ready to go home  Objective: Vital signs in last 24 hours: Temp:  [97.6 F (36.4 C)-97.9 F (36.6 C)] 97.8 F (36.6 C) (10/12 0525) Pulse Rate:  [71-81] 71 (10/12 0525) Resp:  [16] 16 (10/12 0525) BP: (114-132)/(63-78) 132/66 (10/12 0525) SpO2:  [97 %-100 %] 97 % (10/12 0525)  Intake/Output from previous day:  Intake/Output Summary (Last 24 hours) at 05/12/17 0817 Last data filed at 05/12/17 0600  Gross per 24 hour  Intake              510 ml  Output             1950 ml  Net            -1440 ml    Intake/Output this shift: No intake/output data recorded.  Labs:  Recent Labs  05/11/17 0513 05/12/17 0518  HGB 8.6* 8.4*    Recent Labs  05/11/17 0513 05/12/17 0518  WBC 11.4* 11.0*  RBC 2.76* 2.78*  HCT 25.6* 25.8*  PLT 223 223    Recent Labs  05/11/17 0513 05/12/17 0518  NA 135 138  K 4.1 4.0  CL 106 107  CO2 21* 24  BUN 12 16  CREATININE 0.73 0.73  GLUCOSE 129* 129*  CALCIUM 8.1* 8.4*   No results for input(s): LABPT, INR in the last 72 hours.  EXAM: General - Patient is Alert, Appropriate and Oriented Extremity - Neurovascular intact Sensation intact distally Intact pulses distally Dorsiflexion/Plantar flexion intact Incision - clean, dry, no drainage Motor Function - intact, moving foot and toes well on exam.   Assessment/Plan: 2 Days Post-Op Procedure(s) (LRB): LEFT TOTAL HIP ARTHROPLASTY ANTERIOR APPROACH (Left) Procedure(s) (LRB): LEFT TOTAL HIP ARTHROPLASTY ANTERIOR APPROACH (Left) Past Medical History:  Diagnosis Date  . Anemia   . Arthritis   . 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 Complication of anesthesia    "Blood pressure has gone too low before and effected vision" just  with general  . Complication of anesthesia    "woke up in severe panic attack" just with general  . Glaucoma   . Headache    hx of   . Herpes zoster with unspecified complication   . Hip dysplasia    right side  . History of transfusion 2014   2 units, no reaction-after knee replacement  . Hyperlipidemia    under control  . Macular degeneration   . Osteoarthrosis, unspecified whether generalized or localized, unspecified site   . Right hip pain    Active Problems:   OA (osteoarthritis) of hip  Estimated body mass index is 22.83 kg/m as calculated from the following:   Height as of this encounter: 5\' 4"  (1.626 m).   Weight as of this encounter: 60.3 kg (133 lb). Up with therapy Discharge home with home health Diet - Cardiac diet Follow up - in 2 weeks Activity - WBAT Disposition - Home Condition Upon Discharge - Good D/C Meds - See DC Summary DVT Prophylaxis - Eliquis  Arlee Muslim, PA-C Orthopaedic Surgery 05/12/2017, 8:17 AM

## 2017-05-12 NOTE — Progress Notes (Signed)
Rn explained discharge instruction with patient and spouse, all questions answered. Patient reminded about getting all her belongings. Patient was   taken down to car in wheel chair.

## 2017-05-12 NOTE — Progress Notes (Signed)
Physical Therapy Treatment Patient Details Name: Meredith Moore MRN: [id number] DOB: 02/13/1936 Today's Date: 05/12/2017    History of Present Illness Pt is a 81 year old female s/p L direct anterior THA with PMHx of R THA 2015 and blindness    PT Comments    Pt ambulated in hallway, practiced safe step technique, and performed LE exercises.  Pt feels ready for d/c home today.  Follow Up Recommendations  Home health PT     Equipment Recommendations  None recommended by PT    Recommendations for Other Services       Precautions / Restrictions Precautions Precautions: Fall Restrictions Other Position/Activity Restrictions: WBAT    Mobility  Bed Mobility               General bed mobility comments: pt up in recliner on arrival  Transfers Overall transfer level: Needs assistance Equipment used: Rolling walker (2 wheeled) Transfers: Sit to/from Stand Sit to Stand: Min guard         General transfer comment: performs with good technique  Ambulation/Gait Ambulation/Gait assistance: Min guard Ambulation Distance (Feet): 320 Feet Assistive device: Rolling walker (2 wheeled) Gait Pattern/deviations: Step-through pattern;Decreased stride length;Decreased stance time - left;Antalgic     General Gait Details: verbal cues for sequence, heel strike, RW positioning   Stairs Stairs: Yes   Stair Management: Step to pattern;Forwards;With cane;One rail Right Number of Stairs: 4 General stair comments: verbal cues for sequence, pt performs well with rail and cane  Wheelchair Mobility    Modified Rankin (Stroke Patients Only)       Balance                                            Cognition Arousal/Alertness: Awake/alert Behavior During Therapy: WFL for tasks assessed/performed Overall Cognitive Status: Within Functional Limits for tasks assessed                                        Exercises Total Joint  Exercises Ankle Circles/Pumps: AROM;Both;10 reps;Standing (heel raises) Hip ABduction/ADduction: AROM;Standing;10 reps;Left (standing exercises performed with UE support) Knee Flexion: AROM;Standing;Left;10 reps Marching in Standing: AROM;Left;10 reps;Standing Standing Hip Extension: AROM;Left;Standing;10 reps    General Comments        Pertinent Vitals/Pain Pain Assessment: 0-10 Pain Score: 2  Pain Location: L hip Pain Descriptors / Indicators: Aching;Sore Pain Intervention(s): Limited activity within patient's tolerance;Repositioned;Monitored during session    Home Living                      Prior Function            PT Goals (current goals can now be found in the care plan section) Progress towards PT goals: Progressing toward goals    Frequency    7X/week      PT Plan Current plan remains appropriate    Co-evaluation              AM-PAC PT "6 Clicks" Daily Activity  Outcome Measure  Difficulty turning over in bed (including adjusting bedclothes, sheets and blankets)?: None Difficulty moving from lying on back to sitting on the side of the bed? : A Little Difficulty sitting down on and standing up from a chair with arms (Moore.g., wheelchair, bedside commode,  etc,.)?: A Little Help needed moving to and from a bed to chair (including a wheelchair)?: A Little Help needed walking in hospital room?: A Little Help needed climbing 3-5 steps with a railing? : A Little 6 Click Score: 19    End of Session   Activity Tolerance: Patient tolerated treatment well Patient left: in chair;with call bell/phone within reach   PT Visit Diagnosis: Difficulty in walking, not elsewhere classified (R26.2)     Time: 2542-7062 PT Time Calculation (min) (ACUTE ONLY): 23 min  Charges:  $Gait Training: 8-22 mins $Therapeutic Exercise: 8-22 mins                    G Codes:       Meredith Moore, PT, DPT 05/12/2017 Pager: [phone]  Meredith Moore 05/12/2017,  2:15 PM

## 2017-05-12 NOTE — Care Management Note (Signed)
Case Management Note  Patient Details  Name: Meredith Moore MRN: [id number] Date of Birth: [birth year]  Subjective/Objective: PT-recc HHPT. [facility] rep Octavia Bruckner already following PTA-patient agree to [facility]. No further CM needs.                   Action/Plan:d/c home w/HHC.   Expected Discharge Date:  05/12/17               Expected Discharge Plan:  [place]  In-House Referral:     Discharge planning Services  CM Consult  Post Acute Care Choice:  Durable Medical Equipment (rw,cane,raised toilet seat) Choice offered to:  Patient  DME Arranged:    DME Agency:     HH Arranged:  PT [place]:  [facility] (formerly [employer])  Status of Service:  Completed, signed off  If discussed at [employer], dates discussed:    Additional Comments:  Dessa Phi, RN 05/12/2017, 10:56 AM

## 2017-05-12 NOTE — Discharge Summary (Signed)
Physician Discharge Summary   Patient ID: Meredith Moore MRN: [id number] DOB/AGE: 1936-02-18 81 y.o.  Admit date: 05/10/2017 Discharge date: 05-12-2017  Primary Diagnosis:   Admission Diagnoses:  Past Medical History:  Diagnosis Date  . Anemia   . Arthritis   . 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 Complication of anesthesia    "Blood pressure has gone too low before and effected vision" just with general  . Complication of anesthesia    "woke up in severe panic attack" just with general  . Glaucoma   . Headache    hx of   . Herpes zoster with unspecified complication   . Hip dysplasia    right side  . History of transfusion 2014   2 units, no reaction-after knee replacement  . Hyperlipidemia    under control  . Macular degeneration   . Osteoarthrosis, unspecified whether generalized or localized, unspecified site   . Right hip pain    Discharge Diagnoses:   Active Problems:   OA (osteoarthritis) of hip  Estimated body mass index is 22.83 kg/m as calculated from the following:   Height as of this encounter: '5\' 4"'  (1.626 m).   Weight as of this encounter: 60.3 kg (133 lb).  Procedure(s) (LRB): LEFT TOTAL HIP ARTHROPLASTY ANTERIOR APPROACH (Left)   Consults: None  HPI: The patient is a 81 year old female who comes in for a preoperative History and Physical. The patient is scheduled for a left total hip arthroplasty (anterior) to be performed by Dr. Dione Plover. Aluisio, MD at [facility] on 05/10/2017. The patient is a 81 year old female who presented for follow up of their hip. The patient is being followed for their left hip pain and osteoarthritis. They are months out from intra-articular injection. Symptoms reported include: pain, aching, stiffness and difficulty ambulating. The patient feels that they are doing poorly. Current treatment includes: activity modification. The following medication has been used for pain control: antiinflammatory  medication (ibuprofen). The patient has reported improvement of their symptoms with: Cortisone injections. She states that she had some fluid in her hip, and Dr. Nelva Bush aspirated after the injection. She states that the left knee is bothering her too. She has had some intermittent knee pain, and states that she has a grinding sensation in her knee. She does get pain from her left groin down to the knee. She is having some increased back pain as well. She says that she cannot continue like she is and now has reached a point where she would like to proceed with surgery. She has had a very successful RIGHT total hip. She said from pain standpoint this hip is giving her a lot of trouble but even worse of the functional problems. She is at a stage now where she is ready to get the LEFT hip fixed. They have been treated conservatively in the past for the above stated problem and despite conservative measures, they continue to have progressive pain and severe functional limitations and dysfunction. They have failed non-operative management including home exercise, medications, and injections. It is felt that they would benefit from undergoing total joint replacement. Risks and benefits of the procedure have been discussed with the patient and they elect to proceed with surgery. There are no active contraindications to surgery such as ongoing infection or rapidly progressive neurological disease.  Laboratory Data: Admission on 05/10/2017  Component Date Value Ref Range Status  . WBC 05/11/2017 11.4* 4.0 - 10.5 K/uL Final  .  RBC 05/11/2017 2.76* 3.87 - 5.11 MIL/uL Final  . Hemoglobin 05/11/2017 8.6* 12.0 - 15.0 g/dL Final  . HCT 05/11/2017 25.6* 36.0 - 46.0 % Final  . MCV 05/11/2017 92.8  78.0 - 100.0 fL Final  . MCH 05/11/2017 31.2  26.0 - 34.0 pg Final  . MCHC 05/11/2017 33.6  30.0 - 36.0 g/dL Final  . RDW 05/11/2017 13.7  11.5 - 15.5 % Final  . Platelets 05/11/2017 223  150 - 400 K/uL Final  . Sodium  05/11/2017 135  135 - 145 mmol/L Final  . Potassium 05/11/2017 4.1  3.5 - 5.1 mmol/L Final  . Chloride 05/11/2017 106  101 - 111 mmol/L Final  . CO2 05/11/2017 21* 22 - 32 mmol/L Final  . Glucose, Bld 05/11/2017 129* 65 - 99 mg/dL Final  . BUN 05/11/2017 12  6 - 20 mg/dL Final  . Creatinine, Ser 05/11/2017 0.73  0.44 - 1.00 mg/dL Final  . Calcium 05/11/2017 8.1* 8.9 - 10.3 mg/dL Final  . GFR calc non Af Amer 05/11/2017 >60  >60 mL/min Final  . GFR calc Af Amer 05/11/2017 >60  >60 mL/min Final   Comment: (NOTE) The eGFR has been calculated using the CKD EPI equation. This calculation has not been validated in all clinical situations. eGFR's persistently <60 mL/min signify possible Chronic Kidney Disease.   . Anion gap 05/11/2017 8  5 - 15 Final  . WBC 05/12/2017 11.0* 4.0 - 10.5 K/uL Final  . RBC 05/12/2017 2.78* 3.87 - 5.11 MIL/uL Final  . Hemoglobin 05/12/2017 8.4* 12.0 - 15.0 g/dL Final  . HCT 05/12/2017 25.8* 36.0 - 46.0 % Final  . MCV 05/12/2017 92.8  78.0 - 100.0 fL Final  . MCH 05/12/2017 30.2  26.0 - 34.0 pg Final  . MCHC 05/12/2017 32.6  30.0 - 36.0 g/dL Final  . RDW 05/12/2017 13.6  11.5 - 15.5 % Final  . Platelets 05/12/2017 223  150 - 400 K/uL Final  . Sodium 05/12/2017 138  135 - 145 mmol/L Final  . Potassium 05/12/2017 4.0  3.5 - 5.1 mmol/L Final  . Chloride 05/12/2017 107  101 - 111 mmol/L Final  . CO2 05/12/2017 24  22 - 32 mmol/L Final  . Glucose, Bld 05/12/2017 129* 65 - 99 mg/dL Final  . BUN 05/12/2017 16  6 - 20 mg/dL Final  . Creatinine, Ser 05/12/2017 0.73  0.44 - 1.00 mg/dL Final  . Calcium 05/12/2017 8.4* 8.9 - 10.3 mg/dL Final  . GFR calc non Af Amer 05/12/2017 >60  >60 mL/min Final  . GFR calc Af Amer 05/12/2017 >60  >60 mL/min Final   Comment: (NOTE) The eGFR has been calculated using the CKD EPI equation. This calculation has not been validated in all clinical situations. eGFR's persistently <60 mL/min signify possible Chronic Kidney Disease.   Georgiann Hahn gap 05/12/2017 7  5 - 15 Final  Hospital Outpatient Visit on 05/03/2017  Component Date Value Ref Range Status  . aPTT 05/03/2017 32  24 - 36 seconds Final  . WBC 05/03/2017 7.1  4.0 - 10.5 K/uL Final  . RBC 05/03/2017 3.75* 3.87 - 5.11 MIL/uL Final  . Hemoglobin 05/03/2017 11.2* 12.0 - 15.0 g/dL Final  . HCT 05/03/2017 33.6* 36.0 - 46.0 % Final  . MCV 05/03/2017 89.6  78.0 - 100.0 fL Final  . MCH 05/03/2017 29.9  26.0 - 34.0 pg Final  . MCHC 05/03/2017 33.3  30.0 - 36.0 g/dL Final  . RDW 05/03/2017 13.3  11.5 -  15.5 % Final  . Platelets 05/03/2017 278  150 - 400 K/uL Final  . Sodium 05/03/2017 138  135 - 145 mmol/L Final  . Potassium 05/03/2017 4.4  3.5 - 5.1 mmol/L Final  . Chloride 05/03/2017 106  101 - 111 mmol/L Final  . CO2 05/03/2017 24  22 - 32 mmol/L Final  . Glucose, Bld 05/03/2017 99  65 - 99 mg/dL Final  . BUN 05/03/2017 13  6 - 20 mg/dL Final  . Creatinine, Ser 05/03/2017 0.84  0.44 - 1.00 mg/dL Final  . Calcium 05/03/2017 9.1  8.9 - 10.3 mg/dL Final  . Total Protein 05/03/2017 6.8  6.5 - 8.1 g/dL Final  . Albumin 05/03/2017 4.3  3.5 - 5.0 g/dL Final  . AST 05/03/2017 33  15 - 41 U/L Final  . ALT 05/03/2017 32  14 - 54 U/L Final  . Alkaline Phosphatase 05/03/2017 82  38 - 126 U/L Final  . Total Bilirubin 05/03/2017 0.5  0.3 - 1.2 mg/dL Final  . GFR calc non Af Amer 05/03/2017 >60  >60 mL/min Final  . GFR calc Af Amer 05/03/2017 >60  >60 mL/min Final   Comment: (NOTE) The eGFR has been calculated using the CKD EPI equation. This calculation has not been validated in all clinical situations. eGFR's persistently <60 mL/min signify possible Chronic Kidney Disease.   . Anion gap 05/03/2017 8  5 - 15 Final  . Prothrombin Time 05/03/2017 12.4  11.4 - 15.2 seconds Final  . INR 05/03/2017 0.94   Final  . ABO/RH(D) 05/03/2017 A POS   Final  . Antibody Screen 05/03/2017 NEG   Final  . Sample Expiration 05/03/2017 05/13/2017   Final  . Extend sample reason 05/03/2017  NO TRANSFUSIONS OR PREGNANCY IN THE PAST 3 MONTHS   Final  . MRSA, PCR 05/03/2017 NEGATIVE  NEGATIVE Final  . Staphylococcus aureus 05/03/2017 NEGATIVE  NEGATIVE Final   Comment: (NOTE) The Xpert SA Assay (FDA approved for NASAL specimens in patients 7 years of age and older), is one component of a comprehensive surveillance program. It is not intended to diagnose infection nor to guide or monitor treatment.   Office Visit on 04/13/2017  Component Date Value Ref Range Status  . Glucose, Bld 04/13/2017 95  65 - 139 mg/dL Final   Comment: .        Non-fasting reference interval .   . BUN 04/13/2017 16  7 - 25 mg/dL Final  . Creat 04/13/2017 1.06* 0.60 - 0.93 mg/dL Final   Comment: For patients >65 years of age, the reference limit for Creatinine is approximately 13% higher for people identified as African-American. .   . GFR, Est Non African American 04/13/2017 50* > OR = 60 mL/min/1.35m Final  . GFR, Est African American 04/13/2017 58* > OR = 60 mL/min/1.71mFinal  . BUN/Creatinine Ratio 04/13/2017 15  6 - 22 (calc) Final  . Sodium 04/13/2017 139  135 - 146 mmol/L Final  . Potassium 04/13/2017 4.7  3.5 - 5.3 mmol/L Final  . Chloride 04/13/2017 104  98 - 110 mmol/L Final  . CO2 04/13/2017 24  20 - 32 mmol/L Final  . Calcium 04/13/2017 9.2  8.6 - 10.4 mg/dL Final  . Total Protein 04/13/2017 6.0* 6.1 - 8.1 g/dL Final  . Albumin 04/13/2017 4.0  3.6 - 5.1 g/dL Final  . Globulin 04/13/2017 2.0  1.9 - 3.7 g/dL (calc) Final  . AG Ratio 04/13/2017 2.0  1.0 - 2.5 (calc) Final  .  Total Bilirubin 04/13/2017 0.4  0.2 - 1.2 mg/dL Final  . Alkaline phosphatase (APISO) 04/13/2017 75  33 - 130 U/L Final  . AST 04/13/2017 18  10 - 35 U/L Final  . ALT 04/13/2017 12  6 - 29 U/L Final  . WBC 04/13/2017 7.1  3.8 - 10.8 Thousand/uL Final  . RBC 04/13/2017 3.67* 3.80 - 5.10 Million/uL Final  . Hemoglobin 04/13/2017 10.9* 11.7 - 15.5 g/dL Final  . HCT 04/13/2017 33.3* 35.0 - 45.0 % Final  . MCV  04/13/2017 90.7  80.0 - 100.0 fL Final  . MCH 04/13/2017 29.7  27.0 - 33.0 pg Final  . MCHC 04/13/2017 32.7  32.0 - 36.0 g/dL Final  . RDW 04/13/2017 12.6  11.0 - 15.0 % Final  . Platelets 04/13/2017 288  140 - 400 Thousand/uL Final  . MPV 04/13/2017 9.5  7.5 - 12.5 fL Final  . Neutro Abs 04/13/2017 4970  1,500 - 7,800 cells/uL Final  . Lymphs Abs 04/13/2017 1548  850 - 3,900 cells/uL Final  . WBC mixed population 04/13/2017 412  200 - 950 cells/uL Final  . Eosinophils Absolute 04/13/2017 142  15 - 500 cells/uL Final  . Basophils Absolute 04/13/2017 28  0 - 200 cells/uL Final  . Neutrophils Relative % 04/13/2017 70  % Final  . Total Lymphocyte 04/13/2017 21.8  % Final  . Monocytes Relative 04/13/2017 5.8  % Final  . Eosinophils Relative 04/13/2017 2.0  % Final  . Basophils Relative 04/13/2017 0.4  % Final  . Iron 04/13/2017 82  45 - 160 mcg/dL Final  . TIBC 04/13/2017 258  250 - 450 mcg/dL (calc) Final  . %SAT 04/13/2017 32  11 - 50 % (calc) Final  . Ferritin 04/13/2017 161  20 - 288 ng/mL Final  . TEST NAME: 04/13/2017 IRON, TIBC AND FERRITIN PANEL   Final  . TEST CODE: 04/13/2017 [id number]   Final  . CLIENT CONTACT: 04/13/2017 TRACI REYNOLDS   Final  . REPORT ALWAYS MESSAGE SIGNATURE 04/13/2017    Final   Comment: . The laboratory testing on this patient was verbally requested or confirmed by the ordering physician or his or her authorized representative after contact with an employee of [employer]. Federal regulations require that we maintain on file written authorization for all laboratory testing.  Accordingly we are asking that the ordering physician or his or her authorized representative sign a copy of this report and promptly return it to the client service representative. . . Signature:____________________________________________________ . Please fax this signed page to [phone] or return it via your [employer] courier.      X-Rays:Dg Pelvis  Portable  Result Date: 05/10/2017 CLINICAL DATA:  Osteoarthritis of the left hip. EXAM: PORTABLE PELVIS 1-2 VIEWS COMPARISON:  Radiograph dated 06/04/2014 FINDINGS: The components of the left total hip prosthesis appear in excellent position in the AP projection. Soft tissue drain in place. Slight protrusio. No visible fractures. Right total hip prosthesis appears in good position as well. IMPRESSION: Satisfactory appearance of the left hip in the AP projection after total hip prosthesis insertion. Electronically Signed   By: Lorriane Shire M.D.   On: 05/10/2017 10:51   Dg C-arm 1-60 Min-no Report  Result Date: 05/10/2017 Fluoroscopy was utilized by the requesting physician.  No radiographic interpretation.    EKG: Orders placed or performed in visit on 04/13/17  . EKG 12-Lead     Hospital Course: Patient was admitted to [facility] and taken to the OR and  underwent the above state procedure without complications.  Patient tolerated the procedure well and was later transferred to the recovery room and then to the orthopaedic floor for postoperative care.  They were given PO and IV analgesics for pain control following their surgery.  They were given 24 hours of postoperative antibiotics of  Anti-infectives    Start     Dose/Rate Route Frequency Ordered Stop   05/10/17 1430  ceFAZolin (ANCEF) IVPB 1 g/50 mL premix     1 g 100 mL/hr over 30 Minutes Intravenous Every 6 hours 05/10/17 1201 05/10/17 2246   05/10/17 0620  ceFAZolin (ANCEF) 2-4 GM/100ML-% IVPB    Comments:  Waldron Session   : cabinet override      05/10/17 9702 05/10/17 0829   05/10/17 0617  ceFAZolin (ANCEF) IVPB 2g/100 mL premix     2 g 200 mL/hr over 30 Minutes Intravenous On call to O.R. 05/10/17 6378 05/10/17 0829     and started on DVT prophylaxis in the form of Eliquis.   PT and OT were ordered for total hip protocol.  The patient was allowed to be WBAT with therapy. Discharge planning was consulted to help  with postop disposition and equipment needs.  Patient had a good night on the evening of surgery.  They started to get up OOB with therapy on day one.  Hemovac drain was pulled without difficulty.  Continued to work with therapy into day two.  Dressing was changed on day two and the incision was healing well. Patient was seen in rounds and was ready to go home.  Discharge home with home health Diet - Cardiac diet Follow up - in 2 weeks Activity - WBAT Disposition - Home Condition Upon Discharge - Good D/C Meds - See DC Summary DVT Prophylaxis - Eliquis  Discharge Instructions    Call MD / Call 911    Complete by:  As directed    If you experience chest pain or shortness of breath, CALL 911 and be transported to the hospital emergency room.  If you develope a fever above 101 F, pus (white drainage) or increased drainage or redness at the wound, or calf pain, call your surgeon's office.   Change dressing    Complete by:  As directed    You may change your dressing dressing daily with sterile 4 x 4 inch gauze dressing and paper tape.  Do not submerge the incision under water.   Constipation Prevention    Complete by:  As directed    Drink plenty of fluids.  Prune juice may be helpful.  You may use a stool softener, such as Colace (over the counter) 100 mg twice a day.  Use MiraLax (over the counter) for constipation as needed.   Diet - low sodium heart healthy    Complete by:  As directed    Discharge instructions    Complete by:  As directed    Take Eliquis twice a day for three weeks, then switch to a baby 81 mg Aspirin daily for three more weeks.  Pick up stool softner and laxative for home use following surgery while on pain medications. Do not submerge incision under water. Please use good hand washing techniques while changing dressing each day. May shower starting three days after surgery. Please use a clean towel to pat the incision dry following showers. Continue to use ice for  pain and swelling after surgery. Do not use any lotions or creams on the incision until instructed  by your surgeon.  Wear both TED hose on both legs during the day every day for three weeks, but may remove the TED hose at night at home.  Postoperative Constipation Protocol  Constipation - defined medically as fewer than three stools per week and severe constipation as less than one stool per week.  One of the most common issues patients have following surgery is constipation.  Even if you have a regular bowel pattern at home, your normal regimen is likely to be disrupted due to multiple reasons following surgery.  Combination of anesthesia, postoperative narcotics, change in appetite and fluid intake all can affect your bowels.  In order to avoid complications following surgery, here are some recommendations in order to help you during your recovery period.  Colace (docusate) - Pick up an over-the-counter form of Colace or another stool softener and take twice a day as long as you are requiring postoperative pain medications.  Take with a full glass of water daily.  If you experience loose stools or diarrhea, hold the colace until you stool forms back up.  If your symptoms do not get better within 1 week or if they get worse, check with your doctor.  Dulcolax (bisacodyl) - Pick up over-the-counter and take as directed by the product packaging as needed to assist with the movement of your bowels.  Take with a full glass of water.  Use this product as needed if not relieved by Colace only.   MiraLax (polyethylene glycol) - Pick up over-the-counter to have on hand.  MiraLax is a solution that will increase the amount of water in your bowels to assist with bowel movements.  Take as directed and can mix with a glass of water, juice, soda, coffee, or tea.  Take if you go more than two days without a movement. Do not use MiraLax more than once per day. Call your doctor if you are still constipated or  irregular after using this medication for 7 days in a row.  If you continue to have problems with postoperative constipation, please contact the office for further assistance and recommendations.  If you experience "the worst abdominal pain ever" or develop nausea or vomiting, please contact the office immediatly for further recommendations for treatment.   Do not sit on low chairs, stoools or toilet seats, as it may be difficult to get up from low surfaces    Complete by:  As directed    Driving restrictions    Complete by:  As directed    No driving until released by the physician.   Increase activity slowly as tolerated    Complete by:  As directed    Lifting restrictions    Complete by:  As directed    No lifting until released by the physician.   Patient may shower    Complete by:  As directed    You may shower without a dressing once there is no drainage.  Do not wash over the wound.  If drainage remains, do not shower until drainage stops.   TED hose    Complete by:  As directed    Use stockings (TED hose) for 3 weeks on both leg(s).  You may remove them at night for sleeping.   Weight bearing as tolerated    Complete by:  As directed    Laterality:  left   Extremity:  Lower     Allergies as of 05/12/2017      Reactions   Tramadol  Fine when taking it- did not sleep for 6 weeks after stopping medication   Formaldehyde Rash   Nickel Rash      Medication List    TAKE these medications   acetaminophen 500 MG tablet Commonly known as:  TYLENOL Take 1,000 mg by mouth every 6 (six) hours as needed (for pain.).   apixaban 2.5 MG Tabs tablet Commonly known as:  ELIQUIS Take 1 tablet (2.5 mg total) by mouth 2 (two) times daily. Take Eliquis twice a day for a total of three weeks, then discontinue the Eliquis. Once the patient has completed the blood thinner regimen, then take a Baby 81 mg Aspirin daily for three more weeks.   atorvastatin 10 MG tablet Commonly known as:   LIPITOR TAKE ONE-HALF TABLET BY MOUTH AT BEDTIME FOR CHOLESTEROL   EX-LAX PO Take 1 tablet by mouth daily as needed (Constipation).   iron polysaccharides 150 MG capsule Commonly known as:  NIFEREX Take 1 capsule (150 mg total) by mouth 2 (two) times daily.   latanoprost 0.005 % ophthalmic solution Commonly known as:  XALATAN Place 1 drop into both eyes at bedtime. 2300   meclizine 12.5 MG tablet Commonly known as:  ANTIVERT Take one tablet by mouth up to four times daily as needed for vertigo   methocarbamol 500 MG tablet Commonly known as:  ROBAXIN Take 1 tablet (500 mg total) by mouth every 6 (six) hours as needed for muscle spasms.   oxyCODONE 5 MG immediate release tablet Commonly known as:  Oxy IR/ROXICODONE Take 1-2 tablets (5-10 mg total) by mouth every 4 (four) hours as needed for moderate pain or severe pain.   PRESERVISION AREDS 2+MULTI VIT Caps Take 1 tablet by mouth 2 (two) times daily.   QUEtiapine 25 MG tablet Commonly known as:  SEROQUEL 1/2-1 tablet at night as needed for rest What changed:  how much to take  how to take this  when to take this  additional instructions   SIMBRINZA 1-0.2 % Susp Generic drug:  Brinzolamide-Brimonidine Place 1 drop into both eyes 3 (three) times daily with meals.   timolol 0.5 % ophthalmic solution Commonly known as:  TIMOPTIC Place 1 drop into both eyes 2 (two) times daily. Breakfast & dinner.            Discharge Care Instructions        Start     Ordered   05/12/17 0000  Weight bearing as tolerated    Question Answer Comment  Laterality left   Extremity Lower      05/12/17 0824   05/12/17 0000  Change dressing    Comments:  You may change your dressing dressing daily with sterile 4 x 4 inch gauze dressing and paper tape.  Do not submerge the incision under water.   05/12/17 9233     Follow-up Information    Gaynelle Arabian, MD. Schedule an appointment as soon as possible for a visit on  05/23/2017.   Specialty:  Orthopedic Surgery Contact information: [address] [place] [address] [phone]           Signed: Arlee Muslim, PA-C Orthopaedic Surgery 05/12/2017, 8:26 AM

## 2017-05-13 DIAGNOSIS — D631 Anemia in chronic kidney disease: Secondary | ICD-10-CM | POA: Diagnosis not present

## 2017-05-13 DIAGNOSIS — H547 Unspecified visual loss: Secondary | ICD-10-CM | POA: Diagnosis not present

## 2017-05-13 DIAGNOSIS — Z96643 Presence of artificial hip joint, bilateral: Secondary | ICD-10-CM | POA: Diagnosis not present

## 2017-05-13 DIAGNOSIS — H409 Unspecified glaucoma: Secondary | ICD-10-CM | POA: Diagnosis not present

## 2017-05-13 DIAGNOSIS — Z96651 Presence of right artificial knee joint: Secondary | ICD-10-CM | POA: Diagnosis not present

## 2017-05-13 DIAGNOSIS — H3530 Unspecified macular degeneration: Secondary | ICD-10-CM | POA: Diagnosis not present

## 2017-05-13 DIAGNOSIS — M1710 Unilateral primary osteoarthritis, unspecified knee: Secondary | ICD-10-CM | POA: Diagnosis not present

## 2017-05-13 DIAGNOSIS — Z87891 Personal history of nicotine dependence: Secondary | ICD-10-CM | POA: Diagnosis not present

## 2017-05-13 DIAGNOSIS — M8580 Other specified disorders of bone density and structure, unspecified site: Secondary | ICD-10-CM | POA: Diagnosis not present

## 2017-05-13 DIAGNOSIS — M810 Age-related osteoporosis without current pathological fracture: Secondary | ICD-10-CM | POA: Diagnosis not present

## 2017-05-13 DIAGNOSIS — M7542 Impingement syndrome of left shoulder: Secondary | ICD-10-CM | POA: Diagnosis not present

## 2017-05-13 DIAGNOSIS — Z471 Aftercare following joint replacement surgery: Secondary | ICD-10-CM | POA: Diagnosis not present

## 2017-05-13 DIAGNOSIS — N189 Chronic kidney disease, unspecified: Secondary | ICD-10-CM | POA: Diagnosis not present

## 2017-05-15 DIAGNOSIS — N189 Chronic kidney disease, unspecified: Secondary | ICD-10-CM | POA: Diagnosis not present

## 2017-05-15 DIAGNOSIS — Z471 Aftercare following joint replacement surgery: Secondary | ICD-10-CM | POA: Diagnosis not present

## 2017-05-15 DIAGNOSIS — Z87891 Personal history of nicotine dependence: Secondary | ICD-10-CM | POA: Diagnosis not present

## 2017-05-15 DIAGNOSIS — H409 Unspecified glaucoma: Secondary | ICD-10-CM | POA: Diagnosis not present

## 2017-05-15 DIAGNOSIS — M8580 Other specified disorders of bone density and structure, unspecified site: Secondary | ICD-10-CM | POA: Diagnosis not present

## 2017-05-15 DIAGNOSIS — D631 Anemia in chronic kidney disease: Secondary | ICD-10-CM | POA: Diagnosis not present

## 2017-05-15 DIAGNOSIS — M7542 Impingement syndrome of left shoulder: Secondary | ICD-10-CM | POA: Diagnosis not present

## 2017-05-15 DIAGNOSIS — H3530 Unspecified macular degeneration: Secondary | ICD-10-CM | POA: Diagnosis not present

## 2017-05-15 DIAGNOSIS — H547 Unspecified visual loss: Secondary | ICD-10-CM | POA: Diagnosis not present

## 2017-05-15 DIAGNOSIS — Z96651 Presence of right artificial knee joint: Secondary | ICD-10-CM | POA: Diagnosis not present

## 2017-05-15 DIAGNOSIS — Z96643 Presence of artificial hip joint, bilateral: Secondary | ICD-10-CM | POA: Diagnosis not present

## 2017-05-15 DIAGNOSIS — M810 Age-related osteoporosis without current pathological fracture: Secondary | ICD-10-CM | POA: Diagnosis not present

## 2017-05-15 DIAGNOSIS — M1710 Unilateral primary osteoarthritis, unspecified knee: Secondary | ICD-10-CM | POA: Diagnosis not present

## 2017-05-17 DIAGNOSIS — M8580 Other specified disorders of bone density and structure, unspecified site: Secondary | ICD-10-CM | POA: Diagnosis not present

## 2017-05-17 DIAGNOSIS — H547 Unspecified visual loss: Secondary | ICD-10-CM | POA: Diagnosis not present

## 2017-05-17 DIAGNOSIS — H409 Unspecified glaucoma: Secondary | ICD-10-CM | POA: Diagnosis not present

## 2017-05-17 DIAGNOSIS — Z471 Aftercare following joint replacement surgery: Secondary | ICD-10-CM | POA: Diagnosis not present

## 2017-05-17 DIAGNOSIS — Z96643 Presence of artificial hip joint, bilateral: Secondary | ICD-10-CM | POA: Diagnosis not present

## 2017-05-17 DIAGNOSIS — Z87891 Personal history of nicotine dependence: Secondary | ICD-10-CM | POA: Diagnosis not present

## 2017-05-17 DIAGNOSIS — N189 Chronic kidney disease, unspecified: Secondary | ICD-10-CM | POA: Diagnosis not present

## 2017-05-17 DIAGNOSIS — M7542 Impingement syndrome of left shoulder: Secondary | ICD-10-CM | POA: Diagnosis not present

## 2017-05-17 DIAGNOSIS — M1710 Unilateral primary osteoarthritis, unspecified knee: Secondary | ICD-10-CM | POA: Diagnosis not present

## 2017-05-17 DIAGNOSIS — H3530 Unspecified macular degeneration: Secondary | ICD-10-CM | POA: Diagnosis not present

## 2017-05-17 DIAGNOSIS — M810 Age-related osteoporosis without current pathological fracture: Secondary | ICD-10-CM | POA: Diagnosis not present

## 2017-05-17 DIAGNOSIS — Z96651 Presence of right artificial knee joint: Secondary | ICD-10-CM | POA: Diagnosis not present

## 2017-05-17 DIAGNOSIS — D631 Anemia in chronic kidney disease: Secondary | ICD-10-CM | POA: Diagnosis not present

## 2017-05-22 DIAGNOSIS — Z96643 Presence of artificial hip joint, bilateral: Secondary | ICD-10-CM | POA: Diagnosis not present

## 2017-05-22 DIAGNOSIS — N189 Chronic kidney disease, unspecified: Secondary | ICD-10-CM | POA: Diagnosis not present

## 2017-05-22 DIAGNOSIS — H409 Unspecified glaucoma: Secondary | ICD-10-CM | POA: Diagnosis not present

## 2017-05-22 DIAGNOSIS — M810 Age-related osteoporosis without current pathological fracture: Secondary | ICD-10-CM | POA: Diagnosis not present

## 2017-05-22 DIAGNOSIS — D631 Anemia in chronic kidney disease: Secondary | ICD-10-CM | POA: Diagnosis not present

## 2017-05-22 DIAGNOSIS — Z471 Aftercare following joint replacement surgery: Secondary | ICD-10-CM | POA: Diagnosis not present

## 2017-05-22 DIAGNOSIS — M8580 Other specified disorders of bone density and structure, unspecified site: Secondary | ICD-10-CM | POA: Diagnosis not present

## 2017-05-22 DIAGNOSIS — H547 Unspecified visual loss: Secondary | ICD-10-CM | POA: Diagnosis not present

## 2017-05-22 DIAGNOSIS — Z96651 Presence of right artificial knee joint: Secondary | ICD-10-CM | POA: Diagnosis not present

## 2017-05-22 DIAGNOSIS — M7542 Impingement syndrome of left shoulder: Secondary | ICD-10-CM | POA: Diagnosis not present

## 2017-05-22 DIAGNOSIS — M1710 Unilateral primary osteoarthritis, unspecified knee: Secondary | ICD-10-CM | POA: Diagnosis not present

## 2017-05-22 DIAGNOSIS — H3530 Unspecified macular degeneration: Secondary | ICD-10-CM | POA: Diagnosis not present

## 2017-05-22 DIAGNOSIS — Z87891 Personal history of nicotine dependence: Secondary | ICD-10-CM | POA: Diagnosis not present

## 2017-05-25 DIAGNOSIS — H409 Unspecified glaucoma: Secondary | ICD-10-CM | POA: Diagnosis not present

## 2017-05-25 DIAGNOSIS — M810 Age-related osteoporosis without current pathological fracture: Secondary | ICD-10-CM | POA: Diagnosis not present

## 2017-05-25 DIAGNOSIS — Z96643 Presence of artificial hip joint, bilateral: Secondary | ICD-10-CM | POA: Diagnosis not present

## 2017-05-25 DIAGNOSIS — Z471 Aftercare following joint replacement surgery: Secondary | ICD-10-CM | POA: Diagnosis not present

## 2017-05-25 DIAGNOSIS — D631 Anemia in chronic kidney disease: Secondary | ICD-10-CM | POA: Diagnosis not present

## 2017-05-25 DIAGNOSIS — M1710 Unilateral primary osteoarthritis, unspecified knee: Secondary | ICD-10-CM | POA: Diagnosis not present

## 2017-05-25 DIAGNOSIS — Z96651 Presence of right artificial knee joint: Secondary | ICD-10-CM | POA: Diagnosis not present

## 2017-05-25 DIAGNOSIS — N189 Chronic kidney disease, unspecified: Secondary | ICD-10-CM | POA: Diagnosis not present

## 2017-05-25 DIAGNOSIS — H3530 Unspecified macular degeneration: Secondary | ICD-10-CM | POA: Diagnosis not present

## 2017-05-25 DIAGNOSIS — M8580 Other specified disorders of bone density and structure, unspecified site: Secondary | ICD-10-CM | POA: Diagnosis not present

## 2017-05-25 DIAGNOSIS — H547 Unspecified visual loss: Secondary | ICD-10-CM | POA: Diagnosis not present

## 2017-05-25 DIAGNOSIS — Z87891 Personal history of nicotine dependence: Secondary | ICD-10-CM | POA: Diagnosis not present

## 2017-05-25 DIAGNOSIS — M7542 Impingement syndrome of left shoulder: Secondary | ICD-10-CM | POA: Diagnosis not present

## 2017-05-29 DIAGNOSIS — M1710 Unilateral primary osteoarthritis, unspecified knee: Secondary | ICD-10-CM | POA: Diagnosis not present

## 2017-05-29 DIAGNOSIS — Z87891 Personal history of nicotine dependence: Secondary | ICD-10-CM | POA: Diagnosis not present

## 2017-05-29 DIAGNOSIS — Z471 Aftercare following joint replacement surgery: Secondary | ICD-10-CM | POA: Diagnosis not present

## 2017-05-29 DIAGNOSIS — Z96643 Presence of artificial hip joint, bilateral: Secondary | ICD-10-CM | POA: Diagnosis not present

## 2017-05-29 DIAGNOSIS — N189 Chronic kidney disease, unspecified: Secondary | ICD-10-CM | POA: Diagnosis not present

## 2017-05-29 DIAGNOSIS — H547 Unspecified visual loss: Secondary | ICD-10-CM | POA: Diagnosis not present

## 2017-05-29 DIAGNOSIS — M810 Age-related osteoporosis without current pathological fracture: Secondary | ICD-10-CM | POA: Diagnosis not present

## 2017-05-29 DIAGNOSIS — Z96651 Presence of right artificial knee joint: Secondary | ICD-10-CM | POA: Diagnosis not present

## 2017-05-29 DIAGNOSIS — D631 Anemia in chronic kidney disease: Secondary | ICD-10-CM | POA: Diagnosis not present

## 2017-05-29 DIAGNOSIS — H409 Unspecified glaucoma: Secondary | ICD-10-CM | POA: Diagnosis not present

## 2017-05-29 DIAGNOSIS — H3530 Unspecified macular degeneration: Secondary | ICD-10-CM | POA: Diagnosis not present

## 2017-05-29 DIAGNOSIS — M7542 Impingement syndrome of left shoulder: Secondary | ICD-10-CM | POA: Diagnosis not present

## 2017-05-29 DIAGNOSIS — M8580 Other specified disorders of bone density and structure, unspecified site: Secondary | ICD-10-CM | POA: Diagnosis not present

## 2017-06-01 DIAGNOSIS — H3530 Unspecified macular degeneration: Secondary | ICD-10-CM | POA: Diagnosis not present

## 2017-06-01 DIAGNOSIS — H547 Unspecified visual loss: Secondary | ICD-10-CM | POA: Diagnosis not present

## 2017-06-01 DIAGNOSIS — Z96651 Presence of right artificial knee joint: Secondary | ICD-10-CM | POA: Diagnosis not present

## 2017-06-01 DIAGNOSIS — Z471 Aftercare following joint replacement surgery: Secondary | ICD-10-CM | POA: Diagnosis not present

## 2017-06-01 DIAGNOSIS — Z87891 Personal history of nicotine dependence: Secondary | ICD-10-CM | POA: Diagnosis not present

## 2017-06-01 DIAGNOSIS — N189 Chronic kidney disease, unspecified: Secondary | ICD-10-CM | POA: Diagnosis not present

## 2017-06-01 DIAGNOSIS — M810 Age-related osteoporosis without current pathological fracture: Secondary | ICD-10-CM | POA: Diagnosis not present

## 2017-06-01 DIAGNOSIS — H409 Unspecified glaucoma: Secondary | ICD-10-CM | POA: Diagnosis not present

## 2017-06-01 DIAGNOSIS — Z96643 Presence of artificial hip joint, bilateral: Secondary | ICD-10-CM | POA: Diagnosis not present

## 2017-06-01 DIAGNOSIS — M7542 Impingement syndrome of left shoulder: Secondary | ICD-10-CM | POA: Diagnosis not present

## 2017-06-01 DIAGNOSIS — M1710 Unilateral primary osteoarthritis, unspecified knee: Secondary | ICD-10-CM | POA: Diagnosis not present

## 2017-06-01 DIAGNOSIS — M8580 Other specified disorders of bone density and structure, unspecified site: Secondary | ICD-10-CM | POA: Diagnosis not present

## 2017-06-01 DIAGNOSIS — D631 Anemia in chronic kidney disease: Secondary | ICD-10-CM | POA: Diagnosis not present

## 2017-06-05 DIAGNOSIS — H3530 Unspecified macular degeneration: Secondary | ICD-10-CM | POA: Diagnosis not present

## 2017-06-05 DIAGNOSIS — H547 Unspecified visual loss: Secondary | ICD-10-CM | POA: Diagnosis not present

## 2017-06-05 DIAGNOSIS — M1710 Unilateral primary osteoarthritis, unspecified knee: Secondary | ICD-10-CM | POA: Diagnosis not present

## 2017-06-05 DIAGNOSIS — M8580 Other specified disorders of bone density and structure, unspecified site: Secondary | ICD-10-CM | POA: Diagnosis not present

## 2017-06-05 DIAGNOSIS — Z87891 Personal history of nicotine dependence: Secondary | ICD-10-CM | POA: Diagnosis not present

## 2017-06-05 DIAGNOSIS — Z471 Aftercare following joint replacement surgery: Secondary | ICD-10-CM | POA: Diagnosis not present

## 2017-06-05 DIAGNOSIS — N189 Chronic kidney disease, unspecified: Secondary | ICD-10-CM | POA: Diagnosis not present

## 2017-06-05 DIAGNOSIS — D631 Anemia in chronic kidney disease: Secondary | ICD-10-CM | POA: Diagnosis not present

## 2017-06-05 DIAGNOSIS — H409 Unspecified glaucoma: Secondary | ICD-10-CM | POA: Diagnosis not present

## 2017-06-05 DIAGNOSIS — Z96643 Presence of artificial hip joint, bilateral: Secondary | ICD-10-CM | POA: Diagnosis not present

## 2017-06-05 DIAGNOSIS — Z96651 Presence of right artificial knee joint: Secondary | ICD-10-CM | POA: Diagnosis not present

## 2017-06-05 DIAGNOSIS — M810 Age-related osteoporosis without current pathological fracture: Secondary | ICD-10-CM | POA: Diagnosis not present

## 2017-06-05 DIAGNOSIS — M7542 Impingement syndrome of left shoulder: Secondary | ICD-10-CM | POA: Diagnosis not present

## 2017-06-06 ENCOUNTER — Encounter: Payer: Self-pay | Admitting: Nurse Practitioner

## 2017-06-06 ENCOUNTER — Ambulatory Visit (INDEPENDENT_AMBULATORY_CARE_PROVIDER_SITE_OTHER): Payer: Medicare Other | Admitting: Nurse Practitioner

## 2017-06-06 VITALS — BP 124/74 | HR 73 | Temp 98.1°F | Resp 18 | Ht 64.0 in | Wt 136.6 lb

## 2017-06-06 DIAGNOSIS — M1612 Unilateral primary osteoarthritis, left hip: Secondary | ICD-10-CM | POA: Diagnosis not present

## 2017-06-06 DIAGNOSIS — M1712 Unilateral primary osteoarthritis, left knee: Secondary | ICD-10-CM | POA: Diagnosis not present

## 2017-06-06 DIAGNOSIS — D500 Iron deficiency anemia secondary to blood loss (chronic): Secondary | ICD-10-CM

## 2017-06-06 DIAGNOSIS — N289 Disorder of kidney and ureter, unspecified: Secondary | ICD-10-CM

## 2017-06-06 NOTE — Patient Instructions (Signed)
Iron-Rich Diet Iron is a mineral that helps your body to produce hemoglobin. Hemoglobin is a protein in your red blood cells that carries oxygen to your body's tissues. Eating too little iron may cause you to feel weak and tired, and it can increase your risk for infection. Eating enough iron is necessary for your body's metabolism, muscle function, and nervous system. Iron is naturally found in many foods. It can also be added to foods or fortified in foods. There are two types of dietary iron:  Heme iron. Heme iron is absorbed by the body more easily than nonheme iron. Heme iron is found in meat, poultry, and fish.  Nonheme iron. Nonheme iron is found in dietary supplements, iron-fortified grains, beans, and vegetables.  You may need to follow an iron-rich diet if:  You have been diagnosed with iron deficiency or iron-deficiency anemia.  You have a condition that prevents you from absorbing dietary iron, such as: ? Infection in your intestines. ? Celiac disease. This involves long-lasting (chronic) inflammation of your intestines.  You do not eat enough iron.  You eat a diet that is high in foods that impair iron absorption.  You have lost a lot of blood.  You have heavy bleeding during your menstrual cycle.  You are pregnant.  What is my plan? Your health care provider may help you to determine how much iron you need per day based on your condition. Generally, when a person consumes sufficient amounts of iron in the diet, the following iron needs are met:  Men. ? 14-18 years old: 11 mg per day. ? 19-50 years old: 8 mg per day.  Women. ? 14-18 years old: 15 mg per day. ? 19-50 years old: 18 mg per day. ? Over 50 years old: 8 mg per day. ? Pregnant women: 27 mg per day. ? Breastfeeding women: 9 mg per day.  What do I need to know about an iron-rich diet?  Eat fresh fruits and vegetables that are high in vitamin C along with foods that are high in iron. This will help  increase the amount of iron that your body absorbs from food, especially with foods containing nonheme iron. Foods that are high in vitamin C include oranges, peppers, tomatoes, and mango.  Take iron supplements only as directed by your health care provider. Overdose of iron can be life-threatening. If you were prescribed iron supplements, take them with orange juice or a vitamin C supplement.  Cook foods in pots and pans that are made from iron.  Eat nonheme iron-containing foods alongside foods that are high in heme iron. This helps to improve your iron absorption.  Certain foods and drinks contain compounds that impair iron absorption. Avoid eating these foods in the same meal as iron-rich foods or with iron supplements. These include: ? Coffee, black tea, and red wine. ? Milk, dairy products, and foods that are high in calcium. ? Beans, soybeans, and peas. ? Whole grains.  When eating foods that contain both nonheme iron and compounds that impair iron absorption, follow these tips to absorb iron better. ? Soak beans overnight before cooking. ? Soak whole grains overnight and drain them before using. ? Ferment flours before baking, such as using yeast in bread dough. What foods can I eat? Grains Iron-fortified breakfast cereal. Iron-fortified whole-wheat bread. Enriched rice. Sprouted grains. Vegetables Spinach. Potatoes with skin. Green peas. Broccoli. Red and green bell peppers. Fermented vegetables. Fruits Prunes. Raisins. Oranges. Strawberries. Mango. Grapefruit. Meats and Other Protein Sources   Beef liver. Oysters. Beef. Shrimp. [place]. Chicken. [place]. Sardines. Chickpeas. Nuts. Tofu. Beverages Tomato juice. Fresh orange juice. Prune juice. Hibiscus tea. Fortified instant breakfast shakes. Condiments Tahini. Fermented soy sauce. Sweets and Desserts Black-strap molasses. Other Wheat germ. The items listed above may not be a complete list of recommended foods or beverages.  Contact your dietitian for more options. What foods are not recommended? Grains Whole grains. Bran cereal. Bran flour. Oats. Vegetables Artichokes. Brussels sprouts. Kale. Fruits Blueberries. Raspberries. Strawberries. Figs. Meats and Other Protein Sources Soybeans. Products made from soy protein. Dairy Milk. Cream. Cheese. Yogurt. Cottage cheese. Beverages Coffee. Black tea. Red wine. Sweets and Desserts Cocoa. Chocolate. Ice cream. Other Basil. Oregano. Parsley. The items listed above may not be a complete list of foods and beverages to avoid. Contact your dietitian for more information. This information is not intended to replace advice given to you by your health care provider. Make sure you discuss any questions you have with your health care provider. Document Released: 03/01/2005 Document Revised: 02/05/2016 Document Reviewed: 02/12/2014 Elsevier Interactive Patient Education  [employer].

## 2017-06-06 NOTE — Progress Notes (Signed)
Careteam: Patient Care Team: Lauree Chandler, NP as PCP - General (Geriatric Medicine) Gaynelle Arabian, MD as Consulting Physician (Orthopedic Surgery) Calvert Cantor, MD as Consulting Physician (Ophthalmology) Zadie Rhine Clent Demark, MD as Consulting Physician (Ophthalmology) Dohmeier, Asencion Partridge, MD as Consulting Physician (Neurology)  Advanced Directive information Does Patient Have a Medical Advance Directive?: Yes, Type of Advance Directive: [place];Living will, Does patient want to make changes to medical advance directive?: No - Patient declined  Allergies  Allergen Reactions  . Tramadol     Fine when taking it- did not sleep for 6 weeks after stopping medication  . Formaldehyde Rash  . Nickel Rash    Chief Complaint  Patient presents with  . Hospitalization Follow-up    Pt is being seen for hospital follow up. Pt had left hip arthroplasty at [facility] 05/10/17 to 05/12/17. Pt reports she is having severe chills, no energy, and nausea. Pt thinks that her hemoglobin may be low again. It was 8.5 when d/c from hosptial.   . Other    Husband in room     HPI: Patient is a 81 y.o. female seen in the office today due to no energy, chills and possible low hgb. Pt recently with left total hip by Dr Wynelle Link due to severe OA. During hospitalization hgb was 8.4.  Also in pain and can not sleep at night. maxed out her oxycodone prescription and she is allergic to tramadol. Following with orthopedic on the 14th of this month.  She is taking 1 aleve every 12 hours with tylenol in between Instruction from orthopedic was to ice knee which she has been doing. Knee pain is bad and she realizes she needs to get this replaced as well.  Pain down her whole leg.  Not sleeping well due to pain.  Pain and function is slowly improving. Now able to go up the steps.  Working with PT but not able to transition to cane.  Reports she was taking 2 aleve's every 12 hour and feels like this is  what effected her renal function.   appt for bone density in December.   No energy and chills since she has had surgery- feels like this is contributed to anemia. Completed iron prescriptions.  hgb was low prior to surgery as well.  Unsure if stool is dark due to being blind.  No shortness of breath or chest pains.   Review of Systems:  Review of Systems  Constitutional: Negative for chills, fever and weight loss.  HENT: Negative for tinnitus.   Eyes:       Legally blind   Respiratory: Negative for cough, sputum production and shortness of breath.   Cardiovascular: Negative for chest pain, palpitations and leg swelling.  Gastrointestinal: Positive for diarrhea (on and off since surgery) and nausea (at times.). Negative for abdominal pain, constipation and heartburn.  Genitourinary: Negative for dysuria and urgency.  Musculoskeletal: Positive for joint pain and myalgias. Negative for back pain and falls.  Skin: Negative.   Neurological: Negative for dizziness and headaches.    Past Medical History:  Diagnosis Date  . Anemia   . Arthritis   . 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 Complication of anesthesia    "Blood pressure has gone too low before and effected vision" just with general  . Complication of anesthesia    "woke up in severe panic attack" just with general  . Glaucoma   . Headache    hx of   .  Herpes zoster with unspecified complication   . Hip dysplasia    right side  . History of transfusion 2014   2 units, no reaction-after knee replacement  . Hyperlipidemia    under control  . Macular degeneration   . Osteoarthrosis, unspecified whether generalized or localized, unspecified site   . Right hip pain    Past Surgical History:  Procedure Laterality Date  . APPENDECTOMY  1979  . BELPHAROPTOSIS REPAIR    . Left hip arthroplasty     05/10/17 Dr. Wynelle Link  . ovarian tumor   1979   benign tumor removed   . TONSILLECTOMY     1945    Social History:    reports that she quit smoking about 28 years ago. She has a 3.75 pack-year smoking history. she has never used smokeless tobacco. She reports that she does not drink alcohol or use drugs.  Family History  Problem Relation Age of Onset  . Heart disease Mother   . Heart disease Father     Medications:   Medication List        Accurate as of 06/06/17  2:07 PM. Always use your most recent med list.          acetaminophen 500 MG tablet Commonly known as:  TYLENOL   aspirin EC 81 MG tablet   atorvastatin 10 MG tablet Commonly known as:  LIPITOR TAKE ONE-HALF TABLET BY MOUTH AT BEDTIME FOR CHOLESTEROL   EX-LAX PO   latanoprost 0.005 % ophthalmic solution Commonly known as:  XALATAN   meclizine 12.5 MG tablet Commonly known as:  ANTIVERT Take one tablet by mouth up to four times daily as needed for vertigo   methocarbamol 500 MG tablet Commonly known as:  ROBAXIN Take 1 tablet (500 mg total) by mouth every 6 (six) hours as needed for muscle spasms.   PRESERVISION AREDS 2+MULTI VIT Caps   QUEtiapine 25 MG tablet Commonly known as:  SEROQUEL 1/2-1 tablet at night as needed for rest   SIMBRINZA 1-0.2 % Susp Generic drug:  Brinzolamide-Brimonidine   timolol 0.5 % ophthalmic solution Commonly known as:  TIMOPTIC        Physical Exam:  Vitals:   06/06/17 1345  BP: 124/74  Pulse: 73  Resp: 18  Temp: 98.1 F (36.7 C)  TempSrc: Oral  SpO2: 97%  Weight: 136 lb 9.6 oz (62 kg)  Height: '5\' 4"'  (1.626 m)   Body mass index is 23.45 kg/m.  Physical Exam  Constitutional: She is oriented to person, place, and time. She appears well-developed and well-nourished. No distress.  Eyes: Conjunctivae and EOM are normal. Pupils are equal, round, and reactive to light. No scleral icterus.  History of glaucoma and severe macular degeneration managed by Dr. Bing Plume  Neck: Normal range of motion. Neck supple. No JVD present. No tracheal deviation present. No thyromegaly present.    Cardiovascular: Normal rate, regular rhythm and normal heart sounds.  Pulmonary/Chest: Effort normal and breath sounds normal.  Abdominal: Soft. Bowel sounds are normal. She exhibits no distension. There is no tenderness.  Musculoskeletal: She exhibits no edema.  History right total knee and total hip replacement by Dr. Wynelle Link  Left total hip- incision healing well.   Lymphadenopathy:    She has no cervical adenopathy.  Neurological: She is alert and oriented to person, place, and time.  10/12/16 MMSE 27/30. Unable to complete drawing tasks due to severe visual impairment. This is sa normal MMSE otherwise.  Skin: Skin is warm and  dry. No rash noted. She is not diaphoretic. No erythema. No pallor.  Psychiatric: She has a normal mood and affect. Her behavior is normal. Judgment and thought content normal.   Labs reviewed: Basic Metabolic Panel: Recent Labs    05/03/17 1357 05/11/17 0513 05/12/17 0518  NA 138 135 138  K 4.4 4.1 4.0  CL 106 106 107  CO2 24 21* 24  GLUCOSE 99 129* 129*  BUN '13 12 16  ' CREATININE 0.84 0.73 0.73  CALCIUM 9.1 8.1* 8.4*   Liver Function Tests: Recent Labs    10/11/16 1108 04/13/17 1509 05/03/17 1357  AST 21 18 33  ALT 14 12 32  ALKPHOS 48  --  82  BILITOT 0.5 0.4 0.5  PROT 6.5 6.0* 6.8  ALBUMIN 4.2  --  4.3   No results for input(s): LIPASE, AMYLASE in the last 8760 hours. No results for input(s): AMMONIA in the last 8760 hours. CBC: Recent Labs    10/11/16 1108 04/13/17 1509 05/03/17 1357 05/11/17 0513 05/12/17 0518  WBC 5.5 7.1 7.1 11.4* 11.0*  NEUTROABS 3,410 4,970  --   --   --   HGB 12.4 10.9* 11.2* 8.6* 8.4*  HCT 38.0 33.3* 33.6* 25.6* 25.8*  MCV 91.3 90.7 89.6 92.8 92.8  PLT 291 288 278 223 223   Lipid Panel: Recent Labs    10/11/16 1108  CHOL 192  HDL 69  LDLCALC 107*  TRIG 78  CHOLHDL 2.8   TSH: No results for input(s): TSH in the last 8760 hours. A1C: No results found for: HGBA1C   Assessment/Plan 1.  Anemia, blood loss - pt noted to have anemia prior to surgery but now worse. She completed iron supplements. Will follow up lab work -declines rectal exam today but agreeable to home kit - CBC with Differential/Platelets - Fecal Globin By [facility],Medicare; Future  2. Primary osteoarthritis of left hip -s/p total hip. Ongoing pain that is not controlled. Feels like her knee pain is contributing to the painful recovery and inability to sleep. Has been following with ortho for pain control. continues with PT. Using aleve and tylenol as needed.   3. Renal insufficiency To avoid excessive NSAID use and make sure she is staying hydrated. Feel like her renal impairment was due to using excessive aleve, she was unable to read the directions and taking 2 tablets twice daily.  - BMP with eGFR  4. Osteoarthritis of left knee -cont to follow up with orthopedics, to use ice and tylenol with occasional aleve.   Next appt: as scheduled. Sooner as needed Carlos American. Harle Battiest  [facility] [phone] 8 am - 5 pm) [phone] (after hours)

## 2017-06-07 ENCOUNTER — Telehealth (INDEPENDENT_AMBULATORY_CARE_PROVIDER_SITE_OTHER): Payer: Medicare Other

## 2017-06-07 DIAGNOSIS — D649 Anemia, unspecified: Secondary | ICD-10-CM | POA: Diagnosis not present

## 2017-06-07 DIAGNOSIS — Z87891 Personal history of nicotine dependence: Secondary | ICD-10-CM | POA: Diagnosis not present

## 2017-06-07 DIAGNOSIS — H409 Unspecified glaucoma: Secondary | ICD-10-CM | POA: Diagnosis not present

## 2017-06-07 DIAGNOSIS — M8580 Other specified disorders of bone density and structure, unspecified site: Secondary | ICD-10-CM | POA: Diagnosis not present

## 2017-06-07 DIAGNOSIS — N189 Chronic kidney disease, unspecified: Secondary | ICD-10-CM | POA: Diagnosis not present

## 2017-06-07 DIAGNOSIS — M1710 Unilateral primary osteoarthritis, unspecified knee: Secondary | ICD-10-CM | POA: Diagnosis not present

## 2017-06-07 DIAGNOSIS — Z96643 Presence of artificial hip joint, bilateral: Secondary | ICD-10-CM | POA: Diagnosis not present

## 2017-06-07 DIAGNOSIS — H547 Unspecified visual loss: Secondary | ICD-10-CM | POA: Diagnosis not present

## 2017-06-07 DIAGNOSIS — H3530 Unspecified macular degeneration: Secondary | ICD-10-CM | POA: Diagnosis not present

## 2017-06-07 DIAGNOSIS — Z471 Aftercare following joint replacement surgery: Secondary | ICD-10-CM | POA: Diagnosis not present

## 2017-06-07 DIAGNOSIS — M810 Age-related osteoporosis without current pathological fracture: Secondary | ICD-10-CM | POA: Diagnosis not present

## 2017-06-07 DIAGNOSIS — D631 Anemia in chronic kidney disease: Secondary | ICD-10-CM | POA: Diagnosis not present

## 2017-06-07 DIAGNOSIS — M7542 Impingement syndrome of left shoulder: Secondary | ICD-10-CM | POA: Diagnosis not present

## 2017-06-07 DIAGNOSIS — Z96651 Presence of right artificial knee joint: Secondary | ICD-10-CM | POA: Diagnosis not present

## 2017-06-07 LAB — BASIC METABOLIC PANEL WITH GFR
BUN: 16 mg/dL (ref 7–25)
CO2: 25 mmol/L (ref 20–32)
CREATININE: 0.9 mg/dL (ref 0.60–0.93)
Calcium: 9.1 mg/dL (ref 8.6–10.4)
Chloride: 104 mmol/L (ref 98–110)
GFR, EST AFRICAN AMERICAN: 71 mL/min/{1.73_m2} (ref >=60)
GFR, Est Non African American: 61 mL/min/{1.73_m2} (ref >=60)
GLUCOSE: 106 mg/dL (ref 65–139)
Potassium: 4.5 mmol/L (ref 3.5–5.3)
SODIUM: 137 mmol/L (ref 135–146)

## 2017-06-07 LAB — TEST AUTHORIZATION

## 2017-06-07 LAB — POC HEMOCCULT BLD/STL (OFFICE/1-CARD/DIAGNOSTIC): Fecal Occult Blood, POC: NEGATIVE

## 2017-06-07 LAB — CBC WITH DIFFERENTIAL/PLATELET
BASOS ABS: 37 {cells}/uL (ref 0–200)
Basophils Relative: 0.6 %
Eosinophils Absolute: 341 cells/uL (ref 15–500)
Eosinophils Relative: 5.5 %
HCT: 28 % — ABNORMAL LOW (ref 35.0–45.0)
Hemoglobin: 9 g/dL — ABNORMAL LOW (ref 11.7–15.5)
Lymphs Abs: 1414 cells/uL (ref 850–3900)
MCH: 29.3 pg (ref 27.0–33.0)
MCHC: 32.1 g/dL (ref 32.0–36.0)
MCV: 91.2 fL (ref 80.0–100.0)
MPV: 9 fL (ref 7.5–12.5)
Monocytes Relative: 9.1 %
NEUTROS PCT: 62 %
Neutro Abs: 3844 cells/uL (ref 1500–7800)
PLATELETS: 359 10*3/uL (ref 140–400)
RBC: 3.07 10*6/uL — AB (ref 3.80–5.10)
RDW: 12.3 % (ref 11.0–15.0)
TOTAL LYMPHOCYTE: 22.8 %
WBC: 6.2 10*3/uL (ref 3.8–10.8)
WBCMIX: 564 {cells}/uL (ref 200–950)

## 2017-06-07 LAB — IRON,TIBC AND FERRITIN PANEL
%SAT: 16 % (calc) (ref 11–50)
FERRITIN: 149 ng/mL (ref 20–288)
Iron: 40 ug/dL — ABNORMAL LOW (ref 45–160)
TIBC: 254 mcg/dL (calc) (ref 250–450)

## 2017-06-07 NOTE — Telephone Encounter (Signed)
Patient's husband came to pick up the stool containers. While here, he was venting his frustrations over the fact that he did not think all these stool tests were necessary. I told him that these tests could help to determine what was going on with his wife and his response was " all she needs is an oxycodone prescription but no one cares enough to give her one". He then went on to state that his wife was given a prescription for a muscle relaxer at discharge from hospital but there was an issue getting it filled. He stated that 3 separate pharmacies told him that the medication was on backorder and was not available. Once he was able to get the medication filled, he had a confrontation with the pharmacy tech over the length of time it took to fill the Rx. He stated that "he wanted to reach across the counter and smack the girl, choke her to death, or shoot her." I told patient that he might not want to do that due to getting charge and he said he did not care.

## 2017-06-07 NOTE — Telephone Encounter (Signed)
I spoke with patient who was upset due to the fact that her husband told her that the office refused to accept her sample. I explained to patient that we normally do not accept stool specimens due to health/safety concerns but that Jessica had processed the specimen and it was negative for blood. However, due to odor of stool, Jessica wanted to run additional tests and would another stool sample. I explained to patient the exact instructions for collecting this stool sample and she stated that she did have someone who can help her with collecting specimen.   A specimen kit has been place at front desk for patient's husband to pick up.   Patient verbalized understanding.  

## 2017-06-07 NOTE — Telephone Encounter (Signed)
Patient's husband dropped off stool sample in a zip lock bag. The IFOB kit was also in the bag. I informed Mr.Pavlock that he was to follow the instructions given in office and complete the IFOB. Mr.Zollinger states we can do whatever we want want he is not doing anything further. I informed Mr.Frommer that stool in a zip lock back is a inappropriate collection method. Mr.Posthumus said oh  . . . . Well and left.    Janett Billow performed a in-office hemoccult card that was neg.

## 2017-06-08 ENCOUNTER — Other Ambulatory Visit: Payer: Self-pay | Admitting: Nurse Practitioner

## 2017-06-08 ENCOUNTER — Other Ambulatory Visit: Payer: Self-pay

## 2017-06-08 DIAGNOSIS — R197 Diarrhea, unspecified: Secondary | ICD-10-CM

## 2017-06-09 LAB — CLOSTRIDIUM DIFFICILE BY PCR: Toxigenic C. Difficile by PCR: NOT DETECTED

## 2017-06-12 LAB — STOOL CULTURE
MICRO NUMBER: 81259701
MICRO NUMBER: 81259702
MICRO NUMBER:: 81259700
SHIGA RESULT:: NOT DETECTED
SPECIMEN QUALITY: ADEQUATE
SPECIMEN QUALITY:: ADEQUATE
SPECIMEN QUALITY:: ADEQUATE

## 2017-06-13 ENCOUNTER — Other Ambulatory Visit: Payer: Self-pay | Admitting: Nurse Practitioner

## 2017-06-13 ENCOUNTER — Telehealth: Payer: Self-pay | Admitting: *Deleted

## 2017-06-13 DIAGNOSIS — Z471 Aftercare following joint replacement surgery: Secondary | ICD-10-CM | POA: Diagnosis not present

## 2017-06-13 DIAGNOSIS — Z96642 Presence of left artificial hip joint: Secondary | ICD-10-CM | POA: Diagnosis not present

## 2017-06-13 DIAGNOSIS — M25512 Pain in left shoulder: Secondary | ICD-10-CM | POA: Diagnosis not present

## 2017-06-13 MED ORDER — ATORVASTATIN CALCIUM 10 MG PO TABS: ORAL_TABLET | ORAL | 3 refills | Status: DC

## 2017-06-13 NOTE — Telephone Encounter (Signed)
Patient notified and agreed. Patient also wanted a refill on her Atorvastatin. Faxed to pharmacy.

## 2017-06-13 NOTE — Telephone Encounter (Signed)
Patient is calling requesting her results from her Stool Specimen. Stated that she had an appointment today with another Dr. Parks Ranger she needed to give the results to. Please Advise.

## 2017-06-13 NOTE — Telephone Encounter (Signed)
Awaiting O&P but all others have been negative (c diff and culture).

## 2017-06-14 ENCOUNTER — Telehealth: Payer: Self-pay | Admitting: *Deleted

## 2017-06-14 DIAGNOSIS — F5104 Psychophysiologic insomnia: Secondary | ICD-10-CM

## 2017-06-14 LAB — OVA AND PARASITE EXAMINATION
CONCENTRATE RESULT: NONE SEEN
TRICHROME RESULT: NONE SEEN

## 2017-06-14 MED ORDER — QUETIAPINE FUMARATE 25 MG PO TABS: ORAL_TABLET | ORAL | 1 refills | Status: DC

## 2017-06-14 NOTE — Telephone Encounter (Signed)
Patient called and stated that she saw Dr. Wynelle Link yesterday and he did X-Rays and it showed a fracture in the Femur. Stated that when they hammered the spike with ball into the femur at surgery it split the femur. Dr. Wynelle Link sent patient home with Hydrocodone and told her that it would heal in about a month. She stated that it was ~2" crack.   Also patient stated that she has had to sleep on the couch and had to take a whole tablet of the Seroquel instead of 1/2 tablets. Patient is requesting a Rx for One tablet at bedtime for rest. Needs Rx faxed to [facility]. Please Advise.

## 2017-06-14 NOTE — Telephone Encounter (Signed)
Her Rx is already for 1/2 to 1 tablet so she is good to go with that. I hope her pain improves quickly

## 2017-06-14 NOTE — Telephone Encounter (Signed)
Rx faxed to pharmacy  

## 2017-08-03 DIAGNOSIS — H43813 Vitreous degeneration, bilateral: Secondary | ICD-10-CM | POA: Diagnosis not present

## 2017-08-03 DIAGNOSIS — H35363 Drusen (degenerative) of macula, bilateral: Secondary | ICD-10-CM | POA: Diagnosis not present

## 2017-08-03 DIAGNOSIS — H4720 Unspecified optic atrophy: Secondary | ICD-10-CM | POA: Diagnosis not present

## 2017-08-03 DIAGNOSIS — H353134 Nonexudative age-related macular degeneration, bilateral, advanced atrophic with subfoveal involvement: Secondary | ICD-10-CM | POA: Diagnosis not present

## 2017-08-08 DIAGNOSIS — M810 Age-related osteoporosis without current pathological fracture: Secondary | ICD-10-CM | POA: Diagnosis not present

## 2017-08-08 LAB — HM DEXA SCAN

## 2017-08-10 DIAGNOSIS — F5101 Primary insomnia: Secondary | ICD-10-CM | POA: Diagnosis not present

## 2017-08-10 DIAGNOSIS — H548 Legal blindness, as defined in USA: Secondary | ICD-10-CM | POA: Diagnosis not present

## 2017-08-10 DIAGNOSIS — E782 Mixed hyperlipidemia: Secondary | ICD-10-CM | POA: Diagnosis not present

## 2017-08-10 DIAGNOSIS — H409 Unspecified glaucoma: Secondary | ICD-10-CM | POA: Diagnosis not present

## 2017-08-10 DIAGNOSIS — H3530 Unspecified macular degeneration: Secondary | ICD-10-CM | POA: Diagnosis not present

## 2017-08-23 ENCOUNTER — Encounter: Payer: Self-pay | Admitting: *Deleted

## 2017-08-24 ENCOUNTER — Telehealth: Payer: Self-pay | Admitting: Nurse Practitioner

## 2017-08-24 DIAGNOSIS — H353134 Nonexudative age-related macular degeneration, bilateral, advanced atrophic with subfoveal involvement: Secondary | ICD-10-CM | POA: Diagnosis not present

## 2017-08-24 DIAGNOSIS — H401133 Primary open-angle glaucoma, bilateral, severe stage: Secondary | ICD-10-CM | POA: Diagnosis not present

## 2017-08-24 NOTE — Telephone Encounter (Signed)
Bone density reveals she is OSTEOPOROTIC (thinning of the bone) to the left forearm and osteopenic to the spine. Would recommend her starting treatment to avoid fracture. Just to clarify she has NOT been on medication for this in the past? Fosamax/alendronate for osteoporosis? If not would like to start her on fosamax 70 mg by mouth once weekly for osteoporosis, she should take this first thing in the morning 30 mins before any other food/drink/medication and avoid laying down for 30 mins after she takes it. Recommended to take caltrate with D 600/400 twice a day as well and weight bearing activity encouraged.

## 2017-08-24 NOTE — Telephone Encounter (Signed)
Left a message for patient to call the office.

## 2017-08-25 NOTE — Telephone Encounter (Signed)
Noted, thank you

## 2017-08-25 NOTE — Telephone Encounter (Signed)
Patient notified and stated that she is no longer coming to our practice. Patient has already established at [facility] and seeing Dr. Lynelle Doctor. Stated that he has already addressed this and they are looking at [place].

## 2017-08-31 DIAGNOSIS — M1612 Unilateral primary osteoarthritis, left hip: Secondary | ICD-10-CM | POA: Diagnosis not present

## 2017-09-05 DIAGNOSIS — M25512 Pain in left shoulder: Secondary | ICD-10-CM | POA: Diagnosis not present

## 2017-09-05 DIAGNOSIS — M19012 Primary osteoarthritis, left shoulder: Secondary | ICD-10-CM | POA: Diagnosis not present

## 2017-09-11 DIAGNOSIS — H5789 Other specified disorders of eye and adnexa: Secondary | ICD-10-CM | POA: Diagnosis not present

## 2017-09-15 DIAGNOSIS — R42 Dizziness and giddiness: Secondary | ICD-10-CM | POA: Diagnosis not present

## 2017-09-15 DIAGNOSIS — Z79899 Other long term (current) drug therapy: Secondary | ICD-10-CM | POA: Diagnosis not present

## 2017-09-15 DIAGNOSIS — I951 Orthostatic hypotension: Secondary | ICD-10-CM | POA: Diagnosis not present

## 2017-10-12 ENCOUNTER — Ambulatory Visit: Payer: Medicare Other | Admitting: Nurse Practitioner

## 2017-10-19 DIAGNOSIS — Z136 Encounter for screening for cardiovascular disorders: Secondary | ICD-10-CM | POA: Diagnosis not present

## 2017-10-19 DIAGNOSIS — Z471 Aftercare following joint replacement surgery: Secondary | ICD-10-CM | POA: Diagnosis not present

## 2017-10-19 DIAGNOSIS — Z0000 Encounter for general adult medical examination without abnormal findings: Secondary | ICD-10-CM | POA: Diagnosis not present

## 2017-10-19 DIAGNOSIS — Z96642 Presence of left artificial hip joint: Secondary | ICD-10-CM | POA: Diagnosis not present

## 2017-10-23 DIAGNOSIS — Z0000 Encounter for general adult medical examination without abnormal findings: Secondary | ICD-10-CM | POA: Diagnosis not present

## 2017-10-23 DIAGNOSIS — Z23 Encounter for immunization: Secondary | ICD-10-CM | POA: Diagnosis not present

## 2017-10-25 DIAGNOSIS — M19012 Primary osteoarthritis, left shoulder: Secondary | ICD-10-CM | POA: Diagnosis not present

## 2017-12-07 DIAGNOSIS — M810 Age-related osteoporosis without current pathological fracture: Secondary | ICD-10-CM | POA: Diagnosis not present

## 2017-12-12 ENCOUNTER — Other Ambulatory Visit: Payer: Self-pay

## 2017-12-12 ENCOUNTER — Encounter (HOSPITAL_COMMUNITY)
Admission: RE | Admit: 2017-12-12 | Discharge: 2017-12-12 | Disposition: A | Discharge status: Home / Self Care | Payer: Medicare Other | Source: Ambulatory Visit | Attending: Orthopedic Surgery | Admitting: Orthopedic Surgery

## 2017-12-12 ENCOUNTER — Encounter (HOSPITAL_COMMUNITY): Payer: Self-pay

## 2017-12-12 DIAGNOSIS — Z01812 Encounter for preprocedural laboratory examination: Secondary | ICD-10-CM | POA: Insufficient documentation

## 2017-12-12 LAB — BASIC METABOLIC PANEL
Anion gap: 9 (ref 5–15)
BUN: 17 mg/dL (ref 6–20)
CALCIUM: 9.9 mg/dL (ref 8.9–10.3)
CO2: 25 mmol/L (ref 22–32)
Chloride: 105 mmol/L (ref 101–111)
Creatinine, Ser: 1.01 mg/dL — ABNORMAL HIGH (ref 0.44–1.00)
GFR, EST AFRICAN AMERICAN: 60 mL/min — AB (ref >60)
GFR, EST NON AFRICAN AMERICAN: 52 mL/min — AB (ref >60)
Glucose, Bld: 113 mg/dL — ABNORMAL HIGH (ref 65–99)
Potassium: 4.7 mmol/L (ref 3.5–5.1)
SODIUM: 139 mmol/L (ref 135–145)

## 2017-12-12 LAB — CBC
HCT: 34.8 % — ABNORMAL LOW (ref 36.0–46.0)
HEMOGLOBIN: 10.9 g/dL — AB (ref 12.0–15.0)
MCH: 29.3 pg (ref 26.0–34.0)
MCHC: 31.3 g/dL (ref 30.0–36.0)
MCV: 93.5 fL (ref 78.0–100.0)
Platelets: 269 10*3/uL (ref 150–400)
RBC: 3.72 MIL/uL — AB (ref 3.87–5.11)
RDW: 13.6 % (ref 11.5–15.5)
WBC: 8.1 10*3/uL (ref 4.0–10.5)

## 2017-12-12 LAB — SURGICAL PCR SCREEN
MRSA, PCR: NEGATIVE
STAPHYLOCOCCUS AUREUS: NEGATIVE

## 2017-12-12 NOTE — Pre-Procedure Instructions (Signed)
JESSABELLE MARKIEWICZ  12/12/2017      [place], [place] [address] [place] [place] [address] Phone: [phone] Fax: [place], [place] [facility] [address] [place] [address] [place] [address] Phone: [phone] Fax: [phone]    Your procedure is scheduled on May 23   Report to [facility] at [place].M.  Call this number if you have problems the morning of surgery:  [phone]   Remember:  Do not eat food or drink liquids after midnight.  Continue all medications as directed by your physician except follow these medication instructions before surgery below   Take these medicines the morning of surgery with A SIP OF WATER  acetaminophen (TYLENOL Eye drops if needed meclizine (ANTIVERT)   7 days prior to surgery STOP taking any Aspirin(unless otherwise instructed by your surgeon), Aleve, Naproxen, Ibuprofen, Motrin, Advil, Goody's, BC's, all herbal medications, fish oil, and all vitamins  Follow your doctors instructions regarding your Aspirin.  If no instructions were given by your doctor, then you will need to call the prescribing office office to get instructions.     Do not wear jewelry, make-up or nail polish.  Do not wear lotions, powders, or perfumes, or deodorant.  Do not shave 48 hours prior to surgery.    Do not bring valuables to the hospital.  [facility] is not responsible for any belongings or valuables.  Contacts, dentures or bridgework may not be worn into surgery.  Leave your suitcase in the car.  After surgery it may be brought to your room.  For patients admitted to the hospital, discharge time will be determined by your treatment team.  Patients discharged the day of surgery will not be allowed to drive home.    Special instructions:   [facility] [place]- Preparing For Surgery  Before surgery, you can play an important  role. Because skin is not sterile, your skin needs to be as free of germs as possible. You can reduce the number of germs on your skin by washing with CHG (chlorahexidine gluconate) Soap before surgery.  CHG is an antiseptic cleaner which kills germs and bonds with the skin to continue killing germs even after washing.  Oral Hygiene is also important to reduce your risk of infection.  Remember - BRUSH YOUR TEETH THE MORNING OF SURGERY  Please do not use if you have an allergy to CHG or antibacterial soaps. If your skin becomes reddened/irritated stop using the CHG.  Do not shave (including legs and underarms) for at least 48 hours prior to first CHG shower. It is OK to shave your face.  Please follow these instructions carefully.   1. Shower the NIGHT BEFORE SURGERY and the MORNING OF SURGERY with CHG.   2. If you chose to wash your hair, wash your hair first as usual with your normal shampoo.  3. After you shampoo, rinse your hair and body thoroughly to remove the shampoo.  4. Use CHG as you would any other liquid soap. You can apply CHG directly to the skin and wash gently with a scrungie or a clean washcloth.   5. Apply the CHG Soap to your body ONLY FROM THE NECK DOWN.  Do not use on open wounds or open sores. Avoid contact with your eyes, ears, mouth and genitals (private parts). Wash Face and genitals (private parts)  with your normal soap.  6.  Wash thoroughly, paying special attention to the area where your surgery will be performed.  7. Thoroughly rinse your body with warm water from the neck down.  8. DO NOT shower/wash with your normal soap after using and rinsing off the CHG Soap.  9. Pat yourself dry with a CLEAN TOWEL.  10. Wear CLEAN PAJAMAS to bed the night before surgery, wear comfortable clothes the morning of surgery  11. Place CLEAN SHEETS on your bed the night of your first shower and DO NOT SLEEP WITH PETS.    Day of Surgery:  Do not apply any deodorants/lotions.   Please wear clean clothes to the hospital/surgery center.   Remember to brush your teeth.      Please read over the following fact sheets that you were given.

## 2017-12-12 NOTE — Progress Notes (Signed)
PCP - Lennette Bihari Via Cardiologist - denies  Chest x-ray - not needed EKG - 05/2017 Stress Test - denies ECHO - denies Cardiac Cath - denies    Aspirin Instructions: last dose 12/12/17  Anesthesia review: not needed  Patient denies shortness of breath, fever, cough and chest pain at PAT appointment   Patient verbalized understanding of instructions that were given to them at the PAT appointment. Patient was also instructed that they will need to review over the PAT instructions again at home before surgery.

## 2017-12-14 DIAGNOSIS — H353134 Nonexudative age-related macular degeneration, bilateral, advanced atrophic with subfoveal involvement: Secondary | ICD-10-CM | POA: Diagnosis not present

## 2017-12-14 DIAGNOSIS — H401133 Primary open-angle glaucoma, bilateral, severe stage: Secondary | ICD-10-CM | POA: Diagnosis not present

## 2017-12-20 MED ORDER — TRANEXAMIC ACID 1000 MG/10ML IV SOLN
1000.0000 mg | INTRAVENOUS | Status: AC
  Administered 2017-12-21: 1000 mg via INTRAVENOUS
  Filled 2017-12-20: qty 1100

## 2017-12-21 ENCOUNTER — Inpatient Hospital Stay (HOSPITAL_COMMUNITY)
Admission: RE | Admit: 2017-12-21 | Discharge: 2017-12-22 | DRG: 483 | Disposition: A | Discharge status: Home / Self Care | Payer: Medicare Other | Source: Ambulatory Visit | Attending: Orthopedic Surgery | Admitting: Orthopedic Surgery

## 2017-12-21 ENCOUNTER — Inpatient Hospital Stay (HOSPITAL_COMMUNITY): Payer: Medicare Other | Admitting: Certified Registered Nurse Anesthetist

## 2017-12-21 ENCOUNTER — Encounter (HOSPITAL_COMMUNITY)
Admission: RE | Disposition: A | Discharge status: Home / Self Care | Payer: Self-pay | Source: Ambulatory Visit | Attending: Orthopedic Surgery

## 2017-12-21 ENCOUNTER — Encounter (HOSPITAL_COMMUNITY): Payer: Self-pay | Admitting: *Deleted

## 2017-12-21 DIAGNOSIS — Z87891 Personal history of nicotine dependence: Secondary | ICD-10-CM

## 2017-12-21 DIAGNOSIS — Z96619 Presence of unspecified artificial shoulder joint: Secondary | ICD-10-CM

## 2017-12-21 DIAGNOSIS — Z96643 Presence of artificial hip joint, bilateral: Secondary | ICD-10-CM | POA: Diagnosis not present

## 2017-12-21 DIAGNOSIS — Z888 Allergy status to other drugs, medicaments and biological substances status: Secondary | ICD-10-CM

## 2017-12-21 DIAGNOSIS — Z96651 Presence of right artificial knee joint: Secondary | ICD-10-CM | POA: Diagnosis not present

## 2017-12-21 DIAGNOSIS — G8918 Other acute postprocedural pain: Secondary | ICD-10-CM | POA: Diagnosis not present

## 2017-12-21 DIAGNOSIS — M75102 Unspecified rotator cuff tear or rupture of left shoulder, not specified as traumatic: Secondary | ICD-10-CM | POA: Diagnosis present

## 2017-12-21 DIAGNOSIS — Z90722 Acquired absence of ovaries, bilateral: Secondary | ICD-10-CM

## 2017-12-21 DIAGNOSIS — H3530 Unspecified macular degeneration: Secondary | ICD-10-CM | POA: Diagnosis present

## 2017-12-21 DIAGNOSIS — H409 Unspecified glaucoma: Secondary | ICD-10-CM | POA: Diagnosis not present

## 2017-12-21 DIAGNOSIS — Z885 Allergy status to narcotic agent status: Secondary | ICD-10-CM

## 2017-12-21 DIAGNOSIS — Z90710 Acquired absence of both cervix and uterus: Secondary | ICD-10-CM | POA: Diagnosis not present

## 2017-12-21 DIAGNOSIS — Z7982 Long term (current) use of aspirin: Secondary | ICD-10-CM | POA: Diagnosis not present

## 2017-12-21 DIAGNOSIS — H548 Legal blindness, as defined in USA: Secondary | ICD-10-CM | POA: Diagnosis present

## 2017-12-21 DIAGNOSIS — M19012 Primary osteoarthritis, left shoulder: Secondary | ICD-10-CM | POA: Diagnosis not present

## 2017-12-21 DIAGNOSIS — E785 Hyperlipidemia, unspecified: Secondary | ICD-10-CM | POA: Diagnosis not present

## 2017-12-21 DIAGNOSIS — M179 Osteoarthritis of knee, unspecified: Secondary | ICD-10-CM | POA: Diagnosis not present

## 2017-12-21 DIAGNOSIS — Z96612 Presence of left artificial shoulder joint: Secondary | ICD-10-CM

## 2017-12-21 HISTORY — PX: REVERSE SHOULDER ARTHROPLASTY: SHX5054

## 2017-12-21 SURGERY — ARTHROPLASTY, SHOULDER, TOTAL, REVERSE
Anesthesia: General | Site: Shoulder | Laterality: Left

## 2017-12-21 MED ORDER — PHENOL 1.4 % MT LIQD: 1.0000 | OROMUCOSAL | Status: DC | PRN

## 2017-12-21 MED ORDER — SUGAMMADEX SODIUM 200 MG/2ML IV SOLN
INTRAVENOUS | Status: AC
Start: 2017-12-21 — End: ?
  Filled 2017-12-21: qty 2

## 2017-12-21 MED ORDER — DIPHENHYDRAMINE HCL 12.5 MG/5ML PO ELIX: 12.5000 mg | ORAL_SOLUTION | ORAL | Status: DC | PRN

## 2017-12-21 MED ORDER — HYDROMORPHONE HCL 1 MG/ML IJ SOLN: 0.5000 mg | INTRAMUSCULAR | Status: DC | PRN

## 2017-12-21 MED ORDER — FENTANYL CITRATE (PF) 250 MCG/5ML IJ SOLN
INTRAMUSCULAR | Status: DC | PRN
  Administered 2017-12-21 (×2): 50 ug via INTRAVENOUS

## 2017-12-21 MED ORDER — DEXAMETHASONE SODIUM PHOSPHATE 10 MG/ML IJ SOLN
INTRAMUSCULAR | Status: AC
  Filled 2017-12-21: qty 1

## 2017-12-21 MED ORDER — ROCURONIUM BROMIDE 10 MG/ML (PF) SYRINGE
PREFILLED_SYRINGE | INTRAVENOUS | Status: AC
  Filled 2017-12-21: qty 5

## 2017-12-21 MED ORDER — BISACODYL 5 MG PO TBEC: 5.0000 mg | DELAYED_RELEASE_TABLET | Freq: Every day | ORAL | Status: DC | PRN

## 2017-12-21 MED ORDER — PHENYLEPHRINE 40 MCG/ML (10ML) SYRINGE FOR IV PUSH (FOR BLOOD PRESSURE SUPPORT)
PREFILLED_SYRINGE | INTRAVENOUS | Status: DC | PRN
  Administered 2017-12-21: 80 ug via INTRAVENOUS

## 2017-12-21 MED ORDER — BUPIVACAINE-EPINEPHRINE (PF) 0.5% -1:200000 IJ SOLN
INTRAMUSCULAR | Status: DC | PRN
  Administered 2017-12-21: 15 mL via PERINEURAL

## 2017-12-21 MED ORDER — ACETAMINOPHEN 325 MG PO TABS
325.0000 mg | ORAL_TABLET | Freq: Four times a day (QID) | ORAL | Status: DC | PRN
Start: 2017-12-22 — End: 2017-12-22

## 2017-12-21 MED ORDER — DEXAMETHASONE SODIUM PHOSPHATE 10 MG/ML IJ SOLN
INTRAMUSCULAR | Status: DC | PRN
  Administered 2017-12-21: 5 mg via INTRAVENOUS

## 2017-12-21 MED ORDER — HYDROMORPHONE HCL 2 MG/ML IJ SOLN: 0.2500 mg | INTRAMUSCULAR | Status: DC | PRN

## 2017-12-21 MED ORDER — TIMOLOL MALEATE 0.5 % OP SOLN
1.0000 [drp] | Freq: Two times a day (BID) | OPHTHALMIC | Status: DC
  Administered 2017-12-22: 1 [drp] via OPHTHALMIC
  Filled 2017-12-21: qty 5

## 2017-12-21 MED ORDER — DEXTROSE 5 % IV SOLN
INTRAVENOUS | Status: DC | PRN
  Administered 2017-12-21: 45 ug/min via INTRAVENOUS

## 2017-12-21 MED ORDER — METOCLOPRAMIDE HCL 5 MG PO TABS: 5.0000 mg | ORAL_TABLET | Freq: Three times a day (TID) | ORAL | Status: DC | PRN

## 2017-12-21 MED ORDER — ASPIRIN EC 81 MG PO TBEC
81.0000 mg | DELAYED_RELEASE_TABLET | Freq: Every day | ORAL | Status: DC
  Administered 2017-12-21 – 2017-12-22 (×2): 81 mg via ORAL
  Filled 2017-12-21 (×2): qty 1

## 2017-12-21 MED ORDER — LIDOCAINE 2% (20 MG/ML) 5 ML SYRINGE
INTRAMUSCULAR | Status: AC
  Filled 2017-12-21: qty 5

## 2017-12-21 MED ORDER — HOME MED STORE IN PYXIS
1.0000 | Freq: Three times a day (TID) | Status: DC
  Administered 2017-12-21: 1 via OPHTHALMIC

## 2017-12-21 MED ORDER — MENTHOL 3 MG MT LOZG
1.0000 | LOZENGE | OROMUCOSAL | Status: DC | PRN
  Administered 2017-12-21: 3 mg via ORAL
  Filled 2017-12-21: qty 9

## 2017-12-21 MED ORDER — MECLIZINE HCL 12.5 MG PO TABS
12.5000 mg | ORAL_TABLET | Freq: Four times a day (QID) | ORAL | Status: DC | PRN
  Filled 2017-12-21: qty 1

## 2017-12-21 MED ORDER — MIDAZOLAM HCL 2 MG/2ML IJ SOLN
INTRAMUSCULAR | Status: AC
  Filled 2017-12-21: qty 2

## 2017-12-21 MED ORDER — GLYCOPYRROLATE 0.2 MG/ML IJ SOLN
INTRAMUSCULAR | Status: DC | PRN
  Administered 2017-12-21: 0.1 mg via INTRAVENOUS

## 2017-12-21 MED ORDER — CEFAZOLIN SODIUM-DEXTROSE 2-4 GM/100ML-% IV SOLN
2.0000 g | INTRAVENOUS | Status: AC
  Administered 2017-12-21: 2 g via INTRAVENOUS

## 2017-12-21 MED ORDER — OXYCODONE HCL 5 MG PO TABS: 5.0000 mg | ORAL_TABLET | ORAL | Status: DC | PRN

## 2017-12-21 MED ORDER — LIDOCAINE 2% (20 MG/ML) 5 ML SYRINGE
INTRAMUSCULAR | Status: DC | PRN
  Administered 2017-12-21: 50 mg via INTRAVENOUS

## 2017-12-21 MED ORDER — QUETIAPINE 12.5 MG HALF TABLET
12.5000 mg | ORAL_TABLET | Freq: Every day | ORAL | Status: DC
  Administered 2017-12-21: 12.5 mg via ORAL
  Filled 2017-12-21: qty 1

## 2017-12-21 MED ORDER — LACTATED RINGERS IV SOLN: INTRAVENOUS | Status: DC

## 2017-12-21 MED ORDER — 0.9 % SODIUM CHLORIDE (POUR BTL) OPTIME
TOPICAL | Status: DC | PRN
  Administered 2017-12-21: 1000 mL

## 2017-12-21 MED ORDER — LACTATED RINGERS IV SOLN
INTRAVENOUS | Status: DC | PRN
  Administered 2017-12-21: 07:00:00 via INTRAVENOUS

## 2017-12-21 MED ORDER — ONDANSETRON HCL 4 MG/2ML IJ SOLN
INTRAMUSCULAR | Status: DC | PRN
  Administered 2017-12-21: 4 mg via INTRAVENOUS

## 2017-12-21 MED ORDER — PROPOFOL 10 MG/ML IV BOLUS
INTRAVENOUS | Status: DC | PRN
  Administered 2017-12-21: 130 mg via INTRAVENOUS

## 2017-12-21 MED ORDER — ONDANSETRON HCL 4 MG/2ML IJ SOLN: 4.0000 mg | Freq: Once | INTRAMUSCULAR | Status: DC | PRN

## 2017-12-21 MED ORDER — SUGAMMADEX SODIUM 200 MG/2ML IV SOLN
INTRAVENOUS | Status: DC | PRN
  Administered 2017-12-21: 150 mg via INTRAVENOUS

## 2017-12-21 MED ORDER — SUCCINYLCHOLINE CHLORIDE 200 MG/10ML IV SOSY
PREFILLED_SYRINGE | INTRAVENOUS | Status: AC
  Filled 2017-12-21: qty 10

## 2017-12-21 MED ORDER — PROPOFOL 10 MG/ML IV BOLUS
INTRAVENOUS | Status: AC
  Filled 2017-12-21: qty 20

## 2017-12-21 MED ORDER — ROCURONIUM BROMIDE 10 MG/ML (PF) SYRINGE
PREFILLED_SYRINGE | INTRAVENOUS | Status: DC | PRN
  Administered 2017-12-21: 50 mg via INTRAVENOUS

## 2017-12-21 MED ORDER — FENTANYL CITRATE (PF) 250 MCG/5ML IJ SOLN
INTRAMUSCULAR | Status: AC
  Filled 2017-12-21: qty 5

## 2017-12-21 MED ORDER — ONDANSETRON HCL 4 MG/2ML IJ SOLN
4.0000 mg | Freq: Four times a day (QID) | INTRAMUSCULAR | Status: DC | PRN
Start: 2017-12-21 — End: 2017-12-22

## 2017-12-21 MED ORDER — CEFAZOLIN SODIUM-DEXTROSE 2-4 GM/100ML-% IV SOLN
INTRAVENOUS | Status: AC
  Filled 2017-12-21: qty 100

## 2017-12-21 MED ORDER — OXYCODONE HCL 5 MG PO TABS: 10.0000 mg | ORAL_TABLET | ORAL | Status: DC | PRN

## 2017-12-21 MED ORDER — POLYETHYLENE GLYCOL 3350 17 G PO PACK: 17.0000 g | PACK | Freq: Every day | ORAL | Status: DC | PRN

## 2017-12-21 MED ORDER — MIDAZOLAM HCL 2 MG/2ML IJ SOLN
INTRAMUSCULAR | Status: DC | PRN
  Administered 2017-12-21: 1 mg via INTRAVENOUS

## 2017-12-21 MED ORDER — MEPERIDINE HCL 50 MG/ML IJ SOLN: 6.2500 mg | INTRAMUSCULAR | Status: DC | PRN

## 2017-12-21 MED ORDER — ALUM & MAG HYDROXIDE-SIMETH 200-200-20 MG/5ML PO SUSP: 30.0000 mL | ORAL | Status: DC | PRN

## 2017-12-21 MED ORDER — METHOCARBAMOL 1000 MG/10ML IJ SOLN: 500.0000 mg | Freq: Four times a day (QID) | INTRAVENOUS | Status: DC | PRN

## 2017-12-21 MED ORDER — BUPIVACAINE LIPOSOME 1.3 % IJ SUSP
INTRAMUSCULAR | Status: DC | PRN
  Administered 2017-12-21: 10 mL
  Administered 2017-12-21: 10 mL via PERINEURAL

## 2017-12-21 MED ORDER — LATANOPROST 0.005 % OP SOLN
1.0000 [drp] | Freq: Every day | OPHTHALMIC | Status: DC
  Administered 2017-12-21: 1 [drp] via OPHTHALMIC
  Filled 2017-12-21: qty 2.5

## 2017-12-21 MED ORDER — ONDANSETRON HCL 4 MG/2ML IJ SOLN
INTRAMUSCULAR | Status: AC
Start: 2017-12-21 — End: ?
  Filled 2017-12-21: qty 2

## 2017-12-21 MED ORDER — KETOROLAC TROMETHAMINE 15 MG/ML IJ SOLN
7.5000 mg | Freq: Four times a day (QID) | INTRAMUSCULAR | Status: AC
  Administered 2017-12-21 – 2017-12-22 (×4): 7.5 mg via INTRAVENOUS
  Filled 2017-12-21 (×4): qty 1

## 2017-12-21 MED ORDER — CHLORHEXIDINE GLUCONATE 4 % EX LIQD: 60.0000 mL | Freq: Once | CUTANEOUS | Status: DC

## 2017-12-21 MED ORDER — ACETAMINOPHEN 500 MG PO TABS
1000.0000 mg | ORAL_TABLET | Freq: Four times a day (QID) | ORAL | Status: AC
  Administered 2017-12-21 – 2017-12-22 (×4): 1000 mg via ORAL
  Filled 2017-12-21 (×3): qty 2

## 2017-12-21 MED ORDER — ONDANSETRON HCL 4 MG PO TABS: 4.0000 mg | ORAL_TABLET | Freq: Four times a day (QID) | ORAL | Status: DC | PRN

## 2017-12-21 MED ORDER — HYDROMORPHONE HCL 2 MG/ML IJ SOLN: 0.5000 mg | INTRAMUSCULAR | Status: DC | PRN

## 2017-12-21 MED ORDER — MAGNESIUM CITRATE PO SOLN: 1.0000 | Freq: Once | ORAL | Status: DC | PRN

## 2017-12-21 MED ORDER — PHENYLEPHRINE 40 MCG/ML (10ML) SYRINGE FOR IV PUSH (FOR BLOOD PRESSURE SUPPORT)
PREFILLED_SYRINGE | INTRAVENOUS | Status: AC
  Filled 2017-12-21: qty 10

## 2017-12-21 MED ORDER — DOCUSATE SODIUM 100 MG PO CAPS
100.0000 mg | ORAL_CAPSULE | Freq: Two times a day (BID) | ORAL | Status: DC
  Administered 2017-12-21 – 2017-12-22 (×3): 100 mg via ORAL
  Filled 2017-12-21 (×3): qty 1

## 2017-12-21 MED ORDER — BRINZOLAMIDE-BRIMONIDINE 1-0.2 % OP SUSP
1.0000 [drp] | Freq: Three times a day (TID) | OPHTHALMIC | Status: DC
  Administered 2017-12-21 – 2017-12-22 (×2): 1 [drp] via OPHTHALMIC

## 2017-12-21 MED ORDER — EPHEDRINE SULFATE 50 MG/ML IJ SOLN
INTRAMUSCULAR | Status: AC
  Filled 2017-12-21: qty 1

## 2017-12-21 MED ORDER — METOCLOPRAMIDE HCL 5 MG/ML IJ SOLN: 5.0000 mg | Freq: Three times a day (TID) | INTRAMUSCULAR | Status: DC | PRN

## 2017-12-21 MED ORDER — METHOCARBAMOL 500 MG PO TABS: 500.0000 mg | ORAL_TABLET | Freq: Four times a day (QID) | ORAL | Status: DC | PRN

## 2017-12-21 SURGICAL SUPPLY — 67 items
AID PSTN UNV HD RSTRNT DISP (MISCELLANEOUS) ×1
BASEPLATE GLENOSPHERE 25 (Plate) ×2 IMPLANT
BASEPLATE GLENOSPHERE 25MM (Plate) ×1 IMPLANT
BEARING HUIMERAL E1 44X36 STD (Miscellaneous) ×1 IMPLANT
BIT DRILL F/CENTRAL SCRW 3.2 (BIT) ×1
BIT DRILL F/CENTRAL SCRW 3.2MM (BIT) ×1 IMPLANT
BIT DRILL TWIST 2.7 (BIT) ×2 IMPLANT
BIT DRILL TWIST 2.7MM (BIT) ×1
BLADE SAW SGTL 83.5X18.5 (BLADE) ×3 IMPLANT
BRNG HUM STD 44-36 SHLDR E1 (Miscellaneous) ×1 IMPLANT
COVER SURGICAL LIGHT HANDLE (MISCELLANEOUS) ×3 IMPLANT
DERMABOND ADVANCED (GAUZE/BANDAGES/DRESSINGS) ×2
DERMABOND ADVANCED .7 DNX12 (GAUZE/BANDAGES/DRESSINGS) ×1 IMPLANT
DRAPE ORTHO SPLIT 77X108 STRL (DRAPES) ×4
DRAPE SURG 17X11 SM STRL (DRAPES) ×3 IMPLANT
DRAPE SURG ORHT 6 SPLT 77X108 (DRAPES) ×2 IMPLANT
DRAPE U-SHAPE 47X51 STRL (DRAPES) ×3 IMPLANT
DRILL BIT F/CENTRAL SCRW 3.2MM (BIT) ×2
DRSG AQUACEL AG ADV 3.5X10 (GAUZE/BANDAGES/DRESSINGS) ×3 IMPLANT
DURAPREP 26ML APPLICATOR (WOUND CARE) ×3 IMPLANT
ELECT BLADE 4.0 EZ CLEAN MEGAD (MISCELLANEOUS) ×3
ELECT CAUTERY BLADE 6.4 (BLADE) ×3 IMPLANT
ELECT REM PT RETURN 9FT ADLT (ELECTROSURGICAL) ×3
ELECTRODE BLDE 4.0 EZ CLN MEGD (MISCELLANEOUS) ×1 IMPLANT
ELECTRODE REM PT RTRN 9FT ADLT (ELECTROSURGICAL) ×1 IMPLANT
FACESHIELD WRAPAROUND (MASK) ×9 IMPLANT
GLOVE BIO SURGEON STRL SZ7.5 (GLOVE) ×3 IMPLANT
GLOVE BIO SURGEON STRL SZ8 (GLOVE) ×3 IMPLANT
GLOVE BIOGEL PI IND STRL 6.5 (GLOVE) ×1 IMPLANT
GLOVE BIOGEL PI IND STRL 7.0 (GLOVE) ×1 IMPLANT
GLOVE BIOGEL PI INDICATOR 6.5 (GLOVE) ×2
GLOVE BIOGEL PI INDICATOR 7.0 (GLOVE) ×2
GLOVE EUDERMIC 7 POWDERFREE (GLOVE) ×3 IMPLANT
GLOVE SS BIOGEL STRL SZ 7.5 (GLOVE) ×1 IMPLANT
GLOVE SUPERSENSE BIOGEL SZ 7.5 (GLOVE) ×2
GOWN STRL REUS W/ TWL LRG LVL3 (GOWN DISPOSABLE) ×2 IMPLANT
GOWN STRL REUS W/ TWL XL LVL3 (GOWN DISPOSABLE) ×2 IMPLANT
GOWN STRL REUS W/TWL LRG LVL3 (GOWN DISPOSABLE) ×6
GOWN STRL REUS W/TWL XL LVL3 (GOWN DISPOSABLE) ×6
HUMERAL BEARING E1 44X36 STD (Miscellaneous) ×3 IMPLANT
KIT BASIN OR (CUSTOM PROCEDURE TRAY) ×3 IMPLANT
KIT TURNOVER KIT B (KITS) ×3 IMPLANT
MANIFOLD NEPTUNE II (INSTRUMENTS) ×3 IMPLANT
NEEDLE TAPERED W/ NITINOL LOOP (MISCELLANEOUS) ×3 IMPLANT
NS IRRIG 1000ML POUR BTL (IV SOLUTION) ×3 IMPLANT
PACK SHOULDER (CUSTOM PROCEDURE TRAY) ×3 IMPLANT
PAD ARMBOARD 7.5X6 YLW CONV (MISCELLANEOUS) ×6 IMPLANT
PIN THREADED REVERSE (PIN) ×3 IMPLANT
RESTRAINT HEAD UNIVERSAL NS (MISCELLANEOUS) ×3 IMPLANT
SCREW BONE STRL 6.5MMX25MM (Screw) ×3 IMPLANT
SCREW LOCKING 4.75MMX15MM (Screw) ×9 IMPLANT
SCREW LOCKING STRL 4.75X25X3.5 (Screw) ×3 IMPLANT
SLING ARM FOAM STRAP LRG (SOFTGOODS) IMPLANT
SLING ARM FOAM STRAP MED (SOFTGOODS) ×3 IMPLANT
SPHERE GLENOID +3 36 DIA TI (Joint) ×3 IMPLANT
SPONGE LAP 18X18 X RAY DECT (DISPOSABLE) ×3 IMPLANT
SPONGE LAP 4X18 X RAY DECT (DISPOSABLE) ×3 IMPLANT
STEM HUMERAL STRL 11MMX55MM (Stem) ×3 IMPLANT
SUT MNCRL AB 3-0 PS2 18 (SUTURE) ×3 IMPLANT
SUT MON AB 2-0 CT1 27 (SUTURE) ×3 IMPLANT
SUT VIC AB 1 CT1 27 (SUTURE) ×2
SUT VIC AB 1 CT1 27XBRD ANBCTR (SUTURE) ×1 IMPLANT
SUTURE TAPE 1.3 40 TPR END (SUTURE) ×2 IMPLANT
SUTURETAPE 1.3 40 TPR END (SUTURE) ×6
TOWEL OR 17X26 10 PK STRL BLUE (TOWEL DISPOSABLE) ×3 IMPLANT
TRAY SHOULDER HUM 3.5X40MM ×3 IMPLANT
WATER STERILE IRR 1000ML POUR (IV SOLUTION) IMPLANT

## 2017-12-21 NOTE — Transfer of Care (Signed)
Immediate Anesthesia Transfer of Care Note  Patient: NAYSA PUSKAS  Procedure(s) Performed: LEFT REVERSE SHOULDER ARTHROPLASTY (Left Shoulder)  Patient Location: PACU  Anesthesia Type:GA combined with regional for post-op pain  Level of Consciousness: awake and alert   Airway & Oxygen Therapy: Patient Spontanous Breathing and Patient connected to nasal cannula oxygen  Post-op Assessment: Report given to RN and Post -op Vital signs reviewed and stable  Post vital signs: Reviewed and stable  Last Vitals:  Vitals Value Taken Time  BP 110/75 12/21/2017  9:42 AM  Temp 36.3 C 12/21/2017  9:42 AM  Pulse 72 12/21/2017  9:43 AM  Resp 12 12/21/2017  9:43 AM  SpO2 100 % 12/21/2017  9:43 AM  Vitals shown include unvalidated device data.  Last Pain:  Vitals:   12/21/17 0629  TempSrc:   PainSc: 4          Complications: No apparent anesthesia complications

## 2017-12-21 NOTE — Anesthesia Postprocedure Evaluation (Signed)
Anesthesia Post Note  Patient: SHRUTHI NORTHRUP  Procedure(s) Performed: LEFT REVERSE SHOULDER ARTHROPLASTY (Left Shoulder)     Patient location during evaluation: PACU Anesthesia Type: General Level of consciousness: awake and alert Pain management: pain level controlled Vital Signs Assessment: post-procedure vital signs reviewed and stable Respiratory status: spontaneous breathing, nonlabored ventilation, respiratory function stable and patient connected to nasal cannula oxygen Cardiovascular status: blood pressure returned to baseline and stable Postop Assessment: no apparent nausea or vomiting Anesthetic complications: no    Last Vitals:  Vitals:   12/21/17 1125 12/21/17 1138  BP: (!) 129/54 138/79  Pulse: 62 68  Resp: 16 18  Temp: (!) 36.1 C 36.4 C  SpO2: 100% 99%    Last Pain:  Vitals:   12/21/17 1147  TempSrc:   PainSc: 0-No pain                 Christeena Krogh DAVID

## 2017-12-21 NOTE — Progress Notes (Signed)
Call received from patient's husband yelling over the phone complaining that his wife called him complaining of shortness of breath and he feels nothing is been right in the hospital.  Nurse immediately went into patient's room. Upon entering I found patient lying down comfortably in bed with heart rate 62 bpm and pulse ox 98% on room air. Nurse asked patient if she feels short of breath. Patient declined stating " I am ok now and there is nothing wrong with me" . I handed over the phone to patient to talk to husband.  After patient spoke with husband she complained to nurse her throat hurst. Cepacol given to patient as ordered by MD. Call light placed within reach of patient and informed to call for help.

## 2017-12-21 NOTE — Discharge Instructions (Signed)
° °Kevin M. Supple, M.D., F.A.A.O.S. °Orthopaedic Surgery °Specializing in Arthroscopic and Reconstructive °Surgery of the Shoulder and Knee °[phone] °[address] - , [place] [address] - Fax [phone] ° ° °POST-OP TOTAL SHOULDER REPLACEMENT INSTRUCTIONS ° °1. Call the office at [phone] to schedule your first post-op appointment 10-14 days from the date of your surgery. ° °2. The bandage over your incision is waterproof. You may begin showering with this dressing on. You may leave this dressing on until first follow up appointment within 2 weeks. We prefer you leave this dressing in place until follow up however after 5-7 days if you are having itching or skin irritation and would like to remove it you may do so. Go slow and tug at the borders gently to break the bond the dressing has with the skin. At this point if there is no drainage it is okay to go without a bandage or you may cover it with a light guaze and tape. You can also expect significant bruising around your shoulder that will drift down your arm and into your chest wall. This is very normal and should resolve over several days. ° ° 3. Wear your sling/immobilizer at all times except to perform the exercises below or to occasionally let your arm dangle by your side to stretch your elbow. You also need to sleep in your sling immobilizer until instructed otherwise. ° °4. Range of motion to your elbow, wrist, and hand are encouraged 3-5 times daily. Exercise to your hand and fingers helps to reduce swelling you may experience. ° °5. Utilize ice to the shoulder 3-5 times minimum a day and additionally if you are experiencing pain. ° °6. Prescriptions for a pain medication and a muscle relaxant are provided for you. It is recommended that if you are experiencing pain that you pain medication alone is not controlling, add the muscle relaxant along with the pain medication which can give additional pain relief. The first 1-2 days  is generally the most severe of your pain and then should gradually decrease. As your pain lessens it is recommended that you decrease your use of the pain medications to an "as needed basis'" only and to always comply with the recommended dosages of the pain medications. ° °7. Pain medications can produce constipation along with their use. If you experience this, the use of an over the counter stool softener or laxative daily is recommended.  ° °8. For additional questions or concerns, please do not hesitate to call the office. If after hours there is an answering service to forward your concerns to the physician on call. ° °9.Pain control following an exparel block ° °To help control your post-operative pain you received a nerve block  performed with Exparel which is a long acting anesthetic (numbing agent) which can provide pain relief and sensations of numbness (and relief of pain) in the operative shoulder and arm for up to 3 days. Sometimes it provides mixed relief, meaning you may still have numbness in certain areas of the arm but can still be able to move  parts of that arm, hand, and fingers. We recommend that your prescribed pain medications  be used as needed. We do not feel it is necessary to "pre medicate" and "stay ahead" of pain.  Taking narcotic pain medications when you are not having any pain can lead to unnecessary and potentially dangerous side effects.  ° °POST-OP EXERCISES ° °Pendulum Exercises ° °Perform pendulum exercises while standing and bending at   the waist. Support your uninvolved arm on a table or chair and allow your operated arm to hang freely. Make sure to do these exercises passively - not using you shoulder muscles. ° °Repeat 20 times. Do 3 sessions per day. ° ° ° ° °

## 2017-12-21 NOTE — Progress Notes (Signed)
Patient called nurse to room saying she cannot  Breath. Upon entering patient's room pulse ox 100% on room air and heart rate 78 bpm. Patient informed nurse she also saw a light flicking in her room and that made her panic and anxious. Patient informed to take in  slow deep breaths. Small portable fan placed at bedside to help patient because patient complained of feeling warm. Patient also was assisted to use BSC and returned back to bed. Per patient she feels much better. Patient informed to call for help whenever she needs help.

## 2017-12-21 NOTE — Anesthesia Preprocedure Evaluation (Signed)
Anesthesia Evaluation  Patient identified by MRN, date of birth, ID band Patient awake    Reviewed: Allergy & Precautions, NPO status , Patient's Chart, lab work & pertinent test results  Airway Mallampati: I  TM Distance: >3 FB Neck ROM: Full    Dental   Pulmonary former smoker,    Pulmonary exam normal        Cardiovascular Normal cardiovascular exam     Neuro/Psych    GI/Hepatic   Endo/Other    Renal/GU      Musculoskeletal   Abdominal   Peds  Hematology   Anesthesia Other Findings   Reproductive/Obstetrics                             Anesthesia Physical Anesthesia Plan  ASA: III  Anesthesia Plan: General   Post-op Pain Management:  Regional for Post-op pain   Induction: Intravenous  PONV Risk Score and Plan: 3 and Ondansetron, Midazolam and Treatment may vary due to age or medical condition  Airway Management Planned: Oral ETT  Additional Equipment:   Intra-op Plan:   Post-operative Plan: Extubation in OR  Informed Consent: I have reviewed the patients History and Physical, chart, labs and discussed the procedure including the risks, benefits and alternatives for the proposed anesthesia with the patient or authorized representative who has indicated his/her understanding and acceptance.     Plan Discussed with: CRNA and Surgeon  Anesthesia Plan Comments:         Anesthesia Quick Evaluation

## 2017-12-21 NOTE — Op Note (Signed)
12/21/2017  9:47 AM  PATIENT:   Meredith Moore  82 y.o. female  PRE-OPERATIVE DIAGNOSIS:  Left shoulder osteoarthritis with rotator cuff dysfunction  POST-OPERATIVE DIAGNOSIS:  Same   PROCEDURE:  L RSA #11 biomet stem, 36/+3 glenosphere with 45mm inferior offset, standard baseplate  SURGEON:  Zackari Ruane, Metta Clines M.D.  ASSISTANTS: Shuford pac   ANESTHESIA:   GET + ISB with exparel  EBL: 200  SPECIMEN:  none  Drains: none   PATIENT DISPOSITION:  PACU - hemodynamically stable.    PLAN OF CARE: Admit for overnight observation   History:  Patient is a 82 year old female with chronic and progressively increasing left shoulder pain related to end-stage arthrosis of the glenohumeral joint with profound rotator cuff dysfunction.  Due to her increasing pain and functional mentation she is brought to the operating this time for planned left reverse shoulder arthroplasty  Preoperative counseled Meredith Moore regarding treatment options as well as the potential risks versus benefits thereof.  Possible surgical complications were reviewed including the potential for bleeding, infection, neurovascular injury, persistent pain, potential for anesthetic complication, failure of the implant, and possible need for additional surgery.  She understands and accepts and agrees with her planned procedure.  Procedure in detail  Patient identified in the preop holding area receiving preop antibiotics as well as an Exparel interscalene block.  Additionally TXA was utilized.  Brought to the operating placed supine on the operative underwent smooth induction of a general endotracheal anesthesia.  Placed in the beachchair position and appropriate padding protected.  Left shoulder girdle region was sterilely prepped and draped in standard fashion.  Timeout was called.  Anterior deltopectoral approach left shoulder was made through an 8 cm incision.  Skin flaps elevated dissection carried deeply and  electrocautery was used for hemostasis.  Deltopectoral interval was then developed from proximal to distal with a vein taken laterally in the upper centimeter of the pectoralis major tendon was tenotomized to improve exposure.  Conjoined tendon was retracted medially.  The long head biceps tendon was then tenodesed at the upper border of the pectoralis major proximally was then removed and tenotomized and excised.  We then divided the subscapularis from the lesser tuberosity and tagged the margin with a pair of suture tape sutures.  We then divided the capsular attachments from the anterior inferior and inferior aspects of the humeral neck allowing deliver the humeral head through the wound.  We outlined the proposed humeral head resection using extra medullary guide was then performed with the oscillating saw at the native approximate 20 degrees of retroversion.  Remnants of the rotator cuff superiorly were debrided.  Metal cap was then placed over the cut proximal humeral surface and then we exposed the glenoid with combination of Fukuda, pitchfork and snake tongue retractors.  I performed a circumferential labral resection.  Again to complete visualization of the glenoid and then placed our central guidepin at approximately 10 degrees inferior inclination.  The glenoid was then reamed to a stable subchondral bony bed and prominent osteophyte at the upper margin of the glenoid was then removed with rondure.  We then prepared the glenoid with her central reamer and then impacted our glenoid baseplate placed the central lag screw followed by the 4 peripheral locking screws all of which obtained good purchase and fixation.  We then placed a 36/+3 glenosphere with a 2 mm inferior offset onto the baseplate and this was impacted with excellent stability and fixation.  We then returned our attention to  the proximal humerus where we are prepared the humeral canal initially reaming up to size 12 broached up to size 12 and  upon trialing realized that there was significant soft tissue tightness and so we went ahead and re-cut and additional 5 mm from the proximal humeral metaphysis and then re-broached and felt that the size 11 stem gave [place] appropriate fit and balance.  Trial showed good soft tissue balance.  We then went ahead and impacted the size 11 stem into the humerus and again trialed showing good soft tissue balance and ultimately utilized a standard baseplate standard poly-which was impacted in position.  The final reduction was then performed showing good overall soft tissue balance good stability good motion.  We then repaired the subscapularis back to the collar of the implant using the fiber tapes.  The wound was then copes irrigated the shoulder was taken through range of motion showing good stability good motion.  Deltopectoral interval was then closed with a series of figure-of-eight #1 Vicryl sutures.  I should mention that there was a compromise of the cephalic vein which necessitated electrocoagulation at its midpoint obtaining hemostasis.  The subcu layer was then closed with interrupted 2-0 Monocryl sutures intracuticular 3 Monocryl for the skin followed by Dermabond and Aquasol dressing left arm was then placed in a sling the patient was awakened extubated taken recovery room in stable condition  Jenetta Loges PA-C was used as an Environmental consultant throughout this case essential to help with positioning the patient position extremity tissue manipulation suture management wound closure and intraoperative decision-making.   Contact # [phone]

## 2017-12-21 NOTE — Anesthesia Procedure Notes (Signed)
Procedure Name: Intubation Date/Time: 12/21/2017 7:43 AM Performed by: Julieta Bellini, CRNA Pre-anesthesia Checklist: Patient identified, Emergency Drugs available, Suction available and Patient being monitored Patient Re-evaluated:Patient Re-evaluated prior to induction Oxygen Delivery Method: Circle system utilized Preoxygenation: Pre-oxygenation with 100% oxygen Induction Type: IV induction Ventilation: Mask ventilation without difficulty Laryngoscope Size: Mac and 3 Grade View: Grade II Tube type: Oral Tube size: 7.0 mm Number of attempts: 1 Airway Equipment and Method: Stylet Placement Confirmation: ETT inserted through vocal cords under direct vision,  positive ETCO2 and breath sounds checked- equal and bilateral Secured at: 21 cm Tube secured with: Tape Dental Injury: Teeth and Oropharynx as per pre-operative assessment

## 2017-12-21 NOTE — H&P (Signed)
Meredith Moore    Chief Complaint: Left shoulder osteoarthritis HPI: The patient is a 82 y.o. female with end stage left shoulder rotator cuff tear arthropathy  Past Medical History:  Diagnosis Date  . Anemia   . Arthritis   . 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 Complication of anesthesia    "Blood pressure has gone too low before and effected vision" just with general  . Complication of anesthesia    "woke up in severe panic attack" just with general  . Glaucoma   . Headache    hx of   . Herpes zoster with unspecified complication   . Hip dysplasia    right side  . History of transfusion 2014   2 units, no reaction-after knee replacement  . Hyperlipidemia    under control  . Macular degeneration   . Osteoarthrosis, unspecified whether generalized or localized, unspecified site   . Right hip pain    hx     Past Surgical History:  Procedure Laterality Date  . APPENDECTOMY  1979  . BELPHAROPTOSIS REPAIR     eye lid drooping repair  . Left hip arthroplasty     05/10/17 Dr. Wynelle Link  . ovarian tumor   1979   benign tumor removed   . ROBOTIC ASSISTED TOTAL HYSTERECTOMY WITH BILATERAL SALPINGO OOPHERECTOMY Bilateral 03/25/2014   Procedure: ROBOTIC ASSISTED TOTAL HYSTERECTOMY WITH BILATERAL SALPINGO OOPHORECTOMY;  Surgeon: Everitt Amber, MD;  Location: [facility];  Service: Gynecology;  Laterality: Bilateral;  . TONSILLECTOMY     1945   . TOTAL HIP ARTHROPLASTY Right 06/04/2014   Procedure: RIGHT TOTAL HIP ARTHROPLASTY ANTERIOR APPROACH;  Surgeon: Gearlean Alf, MD;  Location: [facility];  Service: Orthopedics;  Laterality: Right;  . TOTAL HIP ARTHROPLASTY Left 05/10/2017   Procedure: LEFT TOTAL HIP ARTHROPLASTY ANTERIOR APPROACH;  Surgeon: Gaynelle Arabian, MD;  Location: [facility];  Service: Orthopedics;  Laterality: Left;  90 mins  . TOTAL KNEE ARTHROPLASTY Right 09/10/2012   Procedure: RIGHT TOTAL KNEE ARTHROPLASTY;  Surgeon: Gearlean Alf, MD;  Location: [facility];  Service:  Orthopedics;  Laterality: Right;    Family History  Problem Relation Age of Onset  . Heart disease Mother   . Heart disease Father     Social History:  reports that she quit smoking about 29 years ago. She has a 3.75 pack-year smoking history. She has never used smokeless tobacco. She reports that she does not drink alcohol or use drugs.   Medications Prior to Admission  Medication Sig Dispense Refill  . acetaminophen (TYLENOL) 500 MG tablet Take 1,000 mg by mouth every 6 (six) hours as needed (for pain. (scheduled in the morning)).     [name] aspirin EC 81 MG tablet Take 81 mg daily by mouth.    [name] atorvastatin (LIPITOR) 10 MG tablet Take 1/2 (one-half) tablet by mouth at bedtime for cholesterol (Patient taking differently: Take 5 mg by mouth at bedtime. Take 1/2 (one-half) tablet by mouth at bedtime for cholesterol) 45 tablet 3  . Calcium Carb-Cholecalciferol (CALCIUM+D3 PO) Take 1 tablet by mouth daily.    . Cholecalciferol (VITAMIN D3 PO) Take 1 tablet by mouth daily.    [name] latanoprost (XALATAN) 0.005 % ophthalmic solution Place 1 drop into both eyes at bedtime. 2300    . Liniments (SALONPAS ARTHRITIS PAIN RELIEF EX) Apply 1 application topically 4 (four) times daily as needed (for shoulder pain.).    [name] Liniments (SALONPAS PAIN RELIEF PATCH EX) Place 1 patch  onto the skin daily as needed (for shoulder pain.).    [name] meclizine (ANTIVERT) 12.5 MG tablet Take one tablet by mouth up to four times daily as needed for vertigo (Patient taking differently: Take 12.5 mg by mouth 4 (four) times daily as needed (for vertigo). ) 30 tablet 5  . Multiple Vitamins-Minerals (PRESERVISION AREDS 2+MULTI VIT) CAPS Take 1 tablet by mouth 2 (two) times daily.    . QUEtiapine (SEROQUEL) 25 MG tablet 1/2-1 tablet at night as needed for rest (Patient taking differently: Take 12.5 mg by mouth at bedtime. ) 90 tablet 1  . senna (SENOKOT) 8.6 MG TABS tablet Take 1-2 tablets by mouth at bedtime as needed (for  constipation.).    [name] SIMBRINZA 1-0.2 % SUSP Place 1 drop into both eyes 3 (three) times daily with meals.     . timolol (TIMOPTIC) 0.5 % ophthalmic solution Place 1 drop into both eyes 2 (two) times daily. Breakfast & dinner.    . trolamine salicylate (ASPERCREME) 10 % cream Apply 1 application topically 2 (two) times daily as needed (for shoulder pain.).       Physical Exam: left shoulder with painful and restricted motion as noted at recent office visits  Vitals  Temp:  [97.6 F (36.4 C)] 97.6 F (36.4 C) (05/23 0603) Pulse Rate:  [75] 75 (05/23 0603) Resp:  [18] 18 (05/23 0603) BP: (146)/(78) 146/78 (05/23 0603) SpO2:  [100 %] 100 % (05/23 0603) Weight:  [59 kg (130 lb)] 59 kg (130 lb) (05/23 0603)  Assessment/Plan  Impression: Left shoulder osteoarthritis  Plan of Action: Procedure(s): LEFT REVERSE SHOULDER ARTHROPLASTY  Alyssa Rotondo M Alise Calais 12/21/2017, 6:18 AM Contact # [phone]

## 2017-12-21 NOTE — Anesthesia Procedure Notes (Signed)
Anesthesia Regional Block: Interscalene brachial plexus block   Pre-Anesthetic Checklist: ,, timeout performed, Correct Patient, Correct Site, Correct Laterality, Correct Procedure, Correct Position, site marked, Risks and benefits discussed,  Surgical consent,  Pre-op evaluation,  At surgeon's request and post-op pain management  Laterality: Left  Prep: chloraprep       Needles:  Injection technique: Single-shot  Needle Type: Echogenic Stimulator Needle     Needle Length: 5cm  Needle Gauge: 21     Additional Needles:   Procedures:, nerve stimulator,,,,,,,   Nerve Stimulator or Paresthesia:  Response: 0.4 mA,   Additional Responses:   Narrative:  Start time: 12/21/2017 7:10 AM End time: 12/21/2017 7:20 AM Injection made incrementally with aspirations every 5 mL.  Performed by: Personally  Anesthesiologist: Lillia Abed, MD  Additional Notes: Monitors applied. Patient sedated. Sterile prep and drape,hand hygiene and sterile gloves were used. Relevant anatomy identified.Needle position confirmed.Local anesthetic injected incrementally after negative aspiration. Local anesthetic spread visualized around nerve(s). Vascular puncture avoided. No complications. Image printed for medical record.The patient tolerated the procedure well.

## 2017-12-22 ENCOUNTER — Encounter (HOSPITAL_COMMUNITY): Payer: Self-pay | Admitting: Orthopedic Surgery

## 2017-12-22 MED ORDER — METHOCARBAMOL 500 MG PO TABS: 500.0000 mg | ORAL_TABLET | Freq: Three times a day (TID) | ORAL | 1 refills | Status: DC | PRN

## 2017-12-22 MED ORDER — OXYCODONE HCL 5 MG PO TABS: 5.0000 mg | ORAL_TABLET | ORAL | 0 refills | Status: DC | PRN

## 2017-12-22 MED ORDER — ONDANSETRON HCL 4 MG PO TABS: 4.0000 mg | ORAL_TABLET | Freq: Three times a day (TID) | ORAL | 0 refills | Status: DC | PRN

## 2017-12-22 NOTE — Evaluation (Signed)
Occupational Therapy Evaluation Patient Details Name: Meredith Moore MRN: [id number] DOB: 04-Jul-1936 Today's Date: 12/22/2017    History of Present Illness S/p reverse total shoulder arthroplasty   Clinical Impression   Pt admitted with the above diagnoses and presents with below problem list. PTA pt was independent with ADLs. Pt is currently setup to min A with most ADLs, supervision for functional mobility/transfers. All shoulder education and assessment completed with pt, spouse present. No further acute OT needs indicated. Pt ok for d/c home from OT standpoint.     Follow Up Recommendations  Follow surgeon's recommendation for DC plan and follow-up therapies;Supervision/Assistance - 24 hour    Equipment Recommendations  None recommended by OT(pt declined shower seat/3n1)    Recommendations for Other Services       Precautions / Restrictions Precautions Precautions: Shoulder Shoulder Interventions: Shoulder sling/immobilizer;Off for dressing/bathing/exercises(off in controlled environment) Precaution Booklet Issued: Yes (comment) Required Braces or Orthoses: Sling Restrictions Weight Bearing Restrictions: Yes LUE Weight Bearing: Non weight bearing      Mobility Bed Mobility Overal bed mobility: Needs Assistance Bed Mobility: Supine to Sit     Supine to sit: Supervision     General bed mobility comments: Good technique.  Transfers Overall transfer level: Needs assistance Equipment used: None Transfers: Sit to/from Stand Sit to Stand: Supervision         General transfer comment: from EOB and 3n1    Balance Overall balance assessment: Mild deficits observed, not formally tested                                         ADL either performed or assessed with clinical judgement   ADL Overall ADL's : Needs assistance/impaired Eating/Feeding: Set up;Sitting   Grooming: Min guard;Standing   Upper Body Bathing: Minimal  assistance;Sitting   Lower Body Bathing: Min guard;Sit to/from stand   Upper Body Dressing : Minimal assistance;Sitting   Lower Body Dressing: Min guard;Sit to/from stand   Toilet Transfer: Supervision/safety   Toileting- Water quality scientist and Hygiene: Supervision/safety   Tub/ Banker: Min guard   Functional mobility during ADLs: Supervision/safety General ADL Comments: Pt completed in room functional mobility, UB/LB dressing, toilet transfer, pericare, and grooming tasks. Spouse present throughout session. Pt verbalizing desire to complete ADLS as independently as possible.      Vision Baseline Vision/History: Legally blind       Perception     Praxis      Pertinent Vitals/Pain Pain Assessment: No/denies pain     Hand Dominance Right   Extremity/Trunk Assessment Upper Extremity Assessment Upper Extremity Assessment: LUE deficits/detail LUE Deficits / Details: S/p reverse total shoulder arthroplasty; decreased sensation with nerve block still largely in effect. Able to complete a few reps of e/w/h AROM exercises  LUE Sensation: decreased light touch   Lower Extremity Assessment Lower Extremity Assessment: Overall WFL for tasks assessed   Cervical / Trunk Assessment Cervical / Trunk Assessment: Normal   Communication Communication Communication: No difficulties   Cognition Arousal/Alertness: Awake/alert Behavior During Therapy: WFL for tasks assessed/performed Overall Cognitive Status: Within Functional Limits for tasks assessed                                     General Comments       Exercises Exercises: Other exercises Other Exercises Other Exercises:  e/w/h in supine 3-5 reps. Seated lap slides, arm dangle, and pendulums, 3-5 reps. Instructed in technique.    Shoulder Instructions      Home Living Family/patient expects to be discharged to:: Private residence Living Arrangements: Spouse/significant other Available Help at  Discharge: Family Type of Home: House Home Access: Stairs to enter Technical brewer of Steps: 4 Entrance Stairs-Rails: Right;Left Home Layout: Two level;Bed/bath upstairs Alternate Level Stairs-Number of Steps: flight Alternate Level Stairs-Rails: Right Bathroom Shower/Tub: Walk-in shower         Home Equipment: Environmental consultant - 2 wheels;Cane - single point;Toilet riser   Additional Comments: Pt reports she practiced stairs and various ADLs in preparation for surgery.        Prior Functioning/Environment Level of Independence: Independent                 OT Problem List: Impaired balance (sitting and/or standing);Decreased knowledge of precautions;Decreased knowledge of use of DME or AE;Pain;Impaired UE functional use      OT Treatment/Interventions:      OT Goals(Current goals can be found in the care plan section) Acute Rehab OT Goals Patient Stated Goal: be as independent as possible  OT Frequency:     Barriers to D/C:            Co-evaluation              AM-PAC PT "6 Clicks" Daily Activity     Outcome Measure Help from another person eating meals?: None Help from another person taking care of personal grooming?: A Little Help from another person toileting, which includes using toliet, bedpan, or urinal?: A Little Help from another person bathing (including washing, rinsing, drying)?: A Little Help from another person to put on and taking off regular upper body clothing?: A Little Help from another person to put on and taking off regular lower body clothing?: A Little 6 Click Score: 19   End of Session Equipment Utilized During Treatment: Other (comment)(sling)  Activity Tolerance: Patient tolerated treatment well Patient left: in bed;with call bell/phone within reach;with family/visitor present(EOB)  OT Visit Diagnosis: Unsteadiness on feet (R26.81);Pain                Time: 1093-2355 OT Time Calculation (min): 52 min Charges:  OT General  Charges $OT Visit: 1 Visit OT Evaluation $OT Eval Low Complexity: 1 Low OT Treatments $Self Care/Home Management : 8-22 mins $Therapeutic Exercise: 8-22 mins G-Codes:       Hortencia Pilar 12/22/2017, 10:49 AM

## 2017-12-22 NOTE — Discharge Summary (Signed)
PATIENT ID:      Meredith Moore  MRN:     [id number] DOB/AGE:    04/12/1936 / 82 y.o.     DISCHARGE SUMMARY  ADMISSION DATE:    12/21/2017 DISCHARGE DATE:    ADMISSION DIAGNOSIS: Left shoulder osteoarthritis Past Medical History:  Diagnosis Date  . Anemia   . Arthritis   . 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 Complication of anesthesia    "Blood pressure has gone too low before and effected vision" just with general  . Complication of anesthesia    "woke up in severe panic attack" just with general  . Glaucoma   . Headache    hx of   . Herpes zoster with unspecified complication   . Hip dysplasia    right side  . History of transfusion 2014   2 units, no reaction-after knee replacement  . Hyperlipidemia    under control  . Macular degeneration   . Osteoarthrosis, unspecified whether generalized or localized, unspecified site   . Right hip pain    hx     DISCHARGE DIAGNOSIS:   Active Problems:   S/p reverse total shoulder arthroplasty   PROCEDURE: Procedure(s): LEFT REVERSE SHOULDER ARTHROPLASTY on 12/21/2017  CONSULTS:    HISTORY:  See H&P in chart.  HOSPITAL COURSE:  Meredith Moore is a 82 y.o. admitted on 12/21/2017 with a diagnosis of Left shoulder osteoarthritis.  They were brought to the operating room on 12/21/2017 and underwent Procedure(s): LEFT REVERSE SHOULDER ARTHROPLASTY.    They were given perioperative antibiotics:  Anti-infectives (From admission, onward)   Start     Dose/Rate Route Frequency Ordered Stop   12/21/17 0630  ceFAZolin (ANCEF) IVPB 2g/100 mL premix     2 g 200 mL/hr over 30 Minutes Intravenous On call to O.R. 12/21/17 2951 12/21/17 0758   12/21/17 8841  ceFAZolin (ANCEF) 2-4 GM/100ML-% IVPB    Note to Pharmacy:  Ardine Eng   : cabinet override      12/21/17 6606 12/21/17 0743    .  Patient underwent the above named procedure and tolerated it well. The following day they were hemodynamically stable and pain was  controlled on oral analgesics. They were neurovascularly intact to the operative extremity. OT was ordered and worked with patient per protocol. They were medically and orthopaedically stable for discharge on day 1 .    DIAGNOSTIC STUDIES:  RECENT RADIOGRAPHIC STUDIES :  No results found.  RECENT VITAL SIGNS:   Patient Vitals for the past 24 hrs:  BP Temp Temp src Pulse Resp SpO2  12/22/17 0504 116/70 98.5 F (36.9 C) Oral 62 - 100 %  12/22/17 0010 124/60 98.3 F (36.8 C) Oral 62 18 100 %  12/21/17 1947 117/64 98.1 F (36.7 C) Oral 65 16 99 %  12/21/17 1138 138/79 97.6 F (36.4 C) Oral 68 18 99 %  12/21/17 1125 (!) 129/54 (!) 97 F (36.1 C) - 62 16 100 %  12/21/17 1112 120/60 - - 62 15 100 %  12/21/17 1057 125/71 - - 63 15 99 %  12/21/17 1042 126/70 - - (!) 59 13 100 %  12/21/17 1027 119/64 - - 65 16 100 %  12/21/17 1012 126/64 - - 64 16 100 %  12/21/17 0957 121/65 - - 68 14 100 %  12/21/17 0942 110/75 (!) 97.3 F (36.3 C) - 75 12 100 %  .  RECENT EKG RESULTS:    Orders placed or performed  in visit on 04/13/17  . EKG 12-Lead    DISCHARGE INSTRUCTIONS:  Discharge Instructions    Discharge patient   Complete by:  As directed    Discharge disposition:  01-Home or Self Care   Discharge patient date:  12/22/2017      DISCHARGE MEDICATIONS:   Allergies as of 12/22/2017      Reactions   Temazepam    Tramadol    Fine when taking it- did not sleep for 6 weeks after stopping medication   Formaldehyde Rash   Nickel Rash      Medication List    TAKE these medications   acetaminophen 500 MG tablet Commonly known as:  TYLENOL Take 1,000 mg by mouth every 6 (six) hours as needed (for pain. (scheduled in the morning)).   aspirin EC 81 MG tablet Take 81 mg daily by mouth.   atorvastatin 10 MG tablet Commonly known as:  LIPITOR Take 1/2 (one-half) tablet by mouth at bedtime for cholesterol What changed:    how much to take  how to take this  when to take  this  additional instructions   CALCIUM+D3 PO Take 1 tablet by mouth daily.   latanoprost 0.005 % ophthalmic solution Commonly known as:  XALATAN Place 1 drop into both eyes at bedtime. 2300   meclizine 12.5 MG tablet Commonly known as:  ANTIVERT Take one tablet by mouth up to four times daily as needed for vertigo What changed:    how much to take  how to take this  when to take this  reasons to take this  additional instructions   methocarbamol 500 MG tablet Commonly known as:  ROBAXIN Take 1 tablet (500 mg total) by mouth every 8 (eight) hours as needed for muscle spasms.   ondansetron 4 MG tablet Commonly known as:  ZOFRAN Take 1 tablet (4 mg total) by mouth every 8 (eight) hours as needed for nausea or vomiting.   oxyCODONE 5 MG immediate release tablet Commonly known as:  ROXICODONE Take 1 tablet (5 mg total) by mouth every 4 (four) hours as needed for severe pain.   PRESERVISION AREDS 2+MULTI VIT Caps Take 1 tablet by mouth 2 (two) times daily.   QUEtiapine 25 MG tablet Commonly known as:  SEROQUEL 1/2-1 tablet at night as needed for rest What changed:    how much to take  how to take this  when to take this  additional instructions   SALONPAS ARTHRITIS PAIN RELIEF EX Apply 1 application topically 4 (four) times daily as needed (for shoulder pain.).   SALONPAS PAIN RELIEF PATCH EX Place 1 patch onto the skin daily as needed (for shoulder pain.).   senna 8.6 MG Tabs tablet Commonly known as:  SENOKOT Take 1-2 tablets by mouth at bedtime as needed (for constipation.).   SIMBRINZA 1-0.2 % Susp Generic drug:  Brinzolamide-Brimonidine Place 1 drop into both eyes 3 (three) times daily with meals.   timolol 0.5 % ophthalmic solution Commonly known as:  TIMOPTIC Place 1 drop into both eyes 2 (two) times daily. Breakfast & dinner.   trolamine salicylate 10 % cream Commonly known as:  ASPERCREME Apply 1 application topically 2 (two) times daily  as needed (for shoulder pain.).   VITAMIN D3 PO Take 1 tablet by mouth daily.       FOLLOW UP VISIT:   Follow-up Information    Justice Britain, MD.   Specialty:  Orthopedic Surgery Why:  call to be seen in 10-14 days  Contact information: [address] [place] [address] [phone]           DISCHARGE TO: Home   DISCHARGE CONDITION:  Meredith Moore for Dr. Justice Britain 12/22/2017, 7:59 AM

## 2017-12-22 NOTE — Progress Notes (Signed)
Pt and spouse given discharge information along with where to receive prescriptions, follow-up appoints, and when to return to the hospital for signs of emergencies. Both verbalized understanding.

## 2017-12-27 DIAGNOSIS — Z96619 Presence of unspecified artificial shoulder joint: Secondary | ICD-10-CM | POA: Diagnosis not present

## 2018-01-03 DIAGNOSIS — Z4789 Encounter for other orthopedic aftercare: Secondary | ICD-10-CM | POA: Diagnosis not present

## 2018-01-05 DIAGNOSIS — M25552 Pain in left hip: Secondary | ICD-10-CM | POA: Diagnosis not present

## 2018-01-10 DIAGNOSIS — M25512 Pain in left shoulder: Secondary | ICD-10-CM | POA: Diagnosis not present

## 2018-01-10 DIAGNOSIS — M25612 Stiffness of left shoulder, not elsewhere classified: Secondary | ICD-10-CM | POA: Diagnosis not present

## 2018-01-18 DIAGNOSIS — M19012 Primary osteoarthritis, left shoulder: Secondary | ICD-10-CM | POA: Diagnosis not present

## 2018-01-25 DIAGNOSIS — M810 Age-related osteoporosis without current pathological fracture: Secondary | ICD-10-CM | POA: Diagnosis not present

## 2018-01-25 DIAGNOSIS — M19012 Primary osteoarthritis, left shoulder: Secondary | ICD-10-CM | POA: Diagnosis not present

## 2018-01-31 DIAGNOSIS — M19012 Primary osteoarthritis, left shoulder: Secondary | ICD-10-CM | POA: Diagnosis not present

## 2018-02-14 DIAGNOSIS — Z1231 Encounter for screening mammogram for malignant neoplasm of breast: Secondary | ICD-10-CM | POA: Diagnosis not present

## 2018-02-15 DIAGNOSIS — M19012 Primary osteoarthritis, left shoulder: Secondary | ICD-10-CM | POA: Diagnosis not present

## 2018-02-26 DIAGNOSIS — M25511 Pain in right shoulder: Secondary | ICD-10-CM | POA: Diagnosis not present

## 2018-02-26 DIAGNOSIS — M7541 Impingement syndrome of right shoulder: Secondary | ICD-10-CM | POA: Diagnosis not present

## 2018-03-02 DIAGNOSIS — H4720 Unspecified optic atrophy: Secondary | ICD-10-CM | POA: Diagnosis not present

## 2018-03-02 DIAGNOSIS — H353134 Nonexudative age-related macular degeneration, bilateral, advanced atrophic with subfoveal involvement: Secondary | ICD-10-CM | POA: Diagnosis not present

## 2018-03-02 DIAGNOSIS — H401133 Primary open-angle glaucoma, bilateral, severe stage: Secondary | ICD-10-CM | POA: Diagnosis not present

## 2018-03-02 DIAGNOSIS — H43813 Vitreous degeneration, bilateral: Secondary | ICD-10-CM | POA: Diagnosis not present

## 2018-04-18 DIAGNOSIS — J069 Acute upper respiratory infection, unspecified: Secondary | ICD-10-CM | POA: Diagnosis not present

## 2018-04-27 DIAGNOSIS — E782 Mixed hyperlipidemia: Secondary | ICD-10-CM | POA: Diagnosis not present

## 2018-04-27 DIAGNOSIS — F5101 Primary insomnia: Secondary | ICD-10-CM | POA: Diagnosis not present

## 2018-04-27 DIAGNOSIS — H3530 Unspecified macular degeneration: Secondary | ICD-10-CM | POA: Diagnosis not present

## 2018-04-27 DIAGNOSIS — J069 Acute upper respiratory infection, unspecified: Secondary | ICD-10-CM | POA: Diagnosis not present

## 2018-04-27 DIAGNOSIS — H409 Unspecified glaucoma: Secondary | ICD-10-CM | POA: Diagnosis not present

## 2018-05-07 DIAGNOSIS — H401133 Primary open-angle glaucoma, bilateral, severe stage: Secondary | ICD-10-CM | POA: Diagnosis not present

## 2018-05-07 DIAGNOSIS — H353134 Nonexudative age-related macular degeneration, bilateral, advanced atrophic with subfoveal involvement: Secondary | ICD-10-CM | POA: Diagnosis not present

## 2018-05-10 DIAGNOSIS — Z96642 Presence of left artificial hip joint: Secondary | ICD-10-CM | POA: Diagnosis not present

## 2018-05-23 DIAGNOSIS — Z96612 Presence of left artificial shoulder joint: Secondary | ICD-10-CM | POA: Diagnosis not present

## 2018-05-23 DIAGNOSIS — Z471 Aftercare following joint replacement surgery: Secondary | ICD-10-CM | POA: Diagnosis not present

## 2018-07-03 ENCOUNTER — Observation Stay (HOSPITAL_COMMUNITY)
Admission: EM | Admit: 2018-07-03 | Discharge: 2018-07-05 | Disposition: A | Discharge status: Home / Self Care | Payer: Medicare Other | Attending: Internal Medicine | Admitting: Internal Medicine

## 2018-07-03 ENCOUNTER — Other Ambulatory Visit: Payer: Self-pay

## 2018-07-03 ENCOUNTER — Observation Stay (HOSPITAL_COMMUNITY): Payer: Medicare Other

## 2018-07-03 ENCOUNTER — Encounter (HOSPITAL_COMMUNITY): Payer: Self-pay | Admitting: Emergency Medicine

## 2018-07-03 DIAGNOSIS — R7989 Other specified abnormal findings of blood chemistry: Secondary | ICD-10-CM | POA: Insufficient documentation

## 2018-07-03 DIAGNOSIS — R918 Other nonspecific abnormal finding of lung field: Secondary | ICD-10-CM | POA: Insufficient documentation

## 2018-07-03 DIAGNOSIS — E876 Hypokalemia: Secondary | ICD-10-CM | POA: Insufficient documentation

## 2018-07-03 DIAGNOSIS — R0989 Other specified symptoms and signs involving the circulatory and respiratory systems: Secondary | ICD-10-CM | POA: Insufficient documentation

## 2018-07-03 DIAGNOSIS — R778 Other specified abnormalities of plasma proteins: Secondary | ICD-10-CM

## 2018-07-03 DIAGNOSIS — D649 Anemia, unspecified: Secondary | ICD-10-CM | POA: Diagnosis not present

## 2018-07-03 DIAGNOSIS — Z888 Allergy status to other drugs, medicaments and biological substances status: Secondary | ICD-10-CM | POA: Insufficient documentation

## 2018-07-03 DIAGNOSIS — I071 Rheumatic tricuspid insufficiency: Secondary | ICD-10-CM | POA: Insufficient documentation

## 2018-07-03 DIAGNOSIS — Z90722 Acquired absence of ovaries, bilateral: Secondary | ICD-10-CM | POA: Insufficient documentation

## 2018-07-03 DIAGNOSIS — I4891 Unspecified atrial fibrillation: Principal | ICD-10-CM | POA: Insufficient documentation

## 2018-07-03 DIAGNOSIS — Z7982 Long term (current) use of aspirin: Secondary | ICD-10-CM | POA: Diagnosis not present

## 2018-07-03 DIAGNOSIS — H3530 Unspecified macular degeneration: Secondary | ICD-10-CM | POA: Insufficient documentation

## 2018-07-03 DIAGNOSIS — E785 Hyperlipidemia, unspecified: Secondary | ICD-10-CM | POA: Insufficient documentation

## 2018-07-03 DIAGNOSIS — Z87891 Personal history of nicotine dependence: Secondary | ICD-10-CM | POA: Insufficient documentation

## 2018-07-03 DIAGNOSIS — R740 Nonspecific elevation of levels of transaminase and lactic acid dehydrogenase [LDH]: Secondary | ICD-10-CM | POA: Diagnosis not present

## 2018-07-03 DIAGNOSIS — I272 Pulmonary hypertension, unspecified: Secondary | ICD-10-CM | POA: Insufficient documentation

## 2018-07-03 DIAGNOSIS — Z885 Allergy status to narcotic agent status: Secondary | ICD-10-CM | POA: Insufficient documentation

## 2018-07-03 DIAGNOSIS — M1990 Unspecified osteoarthritis, unspecified site: Secondary | ICD-10-CM | POA: Insufficient documentation

## 2018-07-03 DIAGNOSIS — Z90710 Acquired absence of both cervix and uterus: Secondary | ICD-10-CM | POA: Insufficient documentation

## 2018-07-03 DIAGNOSIS — Z79899 Other long term (current) drug therapy: Secondary | ICD-10-CM | POA: Diagnosis not present

## 2018-07-03 DIAGNOSIS — A084 Viral intestinal infection, unspecified: Secondary | ICD-10-CM | POA: Diagnosis present

## 2018-07-03 DIAGNOSIS — R531 Weakness: Secondary | ICD-10-CM | POA: Diagnosis not present

## 2018-07-03 DIAGNOSIS — I248 Other forms of acute ischemic heart disease: Secondary | ICD-10-CM

## 2018-07-03 DIAGNOSIS — I420 Dilated cardiomyopathy: Secondary | ICD-10-CM | POA: Diagnosis not present

## 2018-07-03 DIAGNOSIS — I5032 Chronic diastolic (congestive) heart failure: Secondary | ICD-10-CM | POA: Insufficient documentation

## 2018-07-03 DIAGNOSIS — I471 Supraventricular tachycardia: Secondary | ICD-10-CM | POA: Diagnosis present

## 2018-07-03 DIAGNOSIS — Z8249 Family history of ischemic heart disease and other diseases of the circulatory system: Secondary | ICD-10-CM | POA: Insufficient documentation

## 2018-07-03 DIAGNOSIS — N289 Disorder of kidney and ureter, unspecified: Secondary | ICD-10-CM

## 2018-07-03 DIAGNOSIS — N183 Chronic kidney disease, stage 3 (moderate): Secondary | ICD-10-CM | POA: Diagnosis not present

## 2018-07-03 LAB — URINALYSIS, ROUTINE W REFLEX MICROSCOPIC
Bilirubin Urine: NEGATIVE
GLUCOSE, UA: NEGATIVE mg/dL
Hgb urine dipstick: NEGATIVE
Ketones, ur: 5 mg/dL — AB
NITRITE: NEGATIVE
Protein, ur: 30 mg/dL — AB
Specific Gravity, Urine: 1.021 (ref 1.005–1.030)
pH: 5 (ref 5.0–8.0)

## 2018-07-03 LAB — MAGNESIUM: Magnesium: 1.6 mg/dL — ABNORMAL LOW (ref 1.7–2.4)

## 2018-07-03 LAB — HEPATIC FUNCTION PANEL
ALT: 48 U/L — ABNORMAL HIGH (ref 0–44)
AST: 46 U/L — ABNORMAL HIGH (ref 15–41)
Albumin: 3 g/dL — ABNORMAL LOW (ref 3.5–5.0)
Alkaline Phosphatase: 65 U/L (ref 38–126)
BILIRUBIN DIRECT: 0.1 mg/dL (ref 0.0–0.2)
BILIRUBIN INDIRECT: 0.6 mg/dL (ref 0.3–0.9)
Total Bilirubin: 0.7 mg/dL (ref 0.3–1.2)
Total Protein: 5.4 g/dL — ABNORMAL LOW (ref 6.5–8.1)

## 2018-07-03 LAB — BASIC METABOLIC PANEL
Anion gap: 12 (ref 5–15)
BUN: 26 mg/dL — AB (ref 8–23)
CHLORIDE: 103 mmol/L (ref 98–111)
CO2: 20 mmol/L — AB (ref 22–32)
CREATININE: 1.22 mg/dL — AB (ref 0.44–1.00)
Calcium: 8.6 mg/dL — ABNORMAL LOW (ref 8.9–10.3)
GFR calc non Af Amer: 42 mL/min — ABNORMAL LOW (ref >60)
GFR, EST AFRICAN AMERICAN: 49 mL/min — AB (ref >60)
GLUCOSE: 149 mg/dL — AB (ref 70–99)
Potassium: 3.2 mmol/L — ABNORMAL LOW (ref 3.5–5.1)
Sodium: 135 mmol/L (ref 135–145)

## 2018-07-03 LAB — CBC
HEMATOCRIT: 31.5 % — AB (ref 36.0–46.0)
Hemoglobin: 10 g/dL — ABNORMAL LOW (ref 12.0–15.0)
MCH: 29.6 pg (ref 26.0–34.0)
MCHC: 31.7 g/dL (ref 30.0–36.0)
MCV: 93.2 fL (ref 80.0–100.0)
Platelets: 228 10*3/uL (ref 150–400)
RBC: 3.38 MIL/uL — ABNORMAL LOW (ref 3.87–5.11)
RDW: 14.3 % (ref 11.5–15.5)
WBC: 9.6 10*3/uL (ref 4.0–10.5)
nRBC: 0 % (ref 0.0–0.2)

## 2018-07-03 LAB — INFLUENZA PANEL BY PCR (TYPE A & B)
Influenza A By PCR: NEGATIVE
Influenza B By PCR: NEGATIVE

## 2018-07-03 LAB — I-STAT TROPONIN, ED
Troponin i, poc: 0.68 ng/mL (ref 0.00–0.08)
Troponin i, poc: 0.54 ng/mL (ref 0.00–0.08)

## 2018-07-03 LAB — TROPONIN I: Troponin I: 0.61 ng/mL (ref <0.03)

## 2018-07-03 LAB — LIPASE, BLOOD: Lipase: 46 U/L (ref 11–51)

## 2018-07-03 LAB — I-STAT CG4 LACTIC ACID, ED: Lactic Acid, Venous: 1.9 mmol/L (ref 0.5–1.9)

## 2018-07-03 LAB — TSH: TSH: 0.867 u[IU]/mL (ref 0.350–4.500)

## 2018-07-03 MED ORDER — HEPARIN SODIUM (PORCINE) 5000 UNIT/ML IJ SOLN
60.0000 [IU]/kg | Freq: Once | INTRAMUSCULAR | Status: AC
  Administered 2018-07-03: 3550 [IU] via INTRAVENOUS

## 2018-07-03 MED ORDER — SODIUM CHLORIDE 0.9 % IV SOLN: 250.0000 mL | INTRAVENOUS | Status: DC | PRN

## 2018-07-03 MED ORDER — SODIUM CHLORIDE 0.9% FLUSH: 3.0000 mL | INTRAVENOUS | Status: DC | PRN

## 2018-07-03 MED ORDER — ONDANSETRON HCL 4 MG/2ML IJ SOLN: 4.0000 mg | Freq: Four times a day (QID) | INTRAMUSCULAR | Status: DC | PRN

## 2018-07-03 MED ORDER — SODIUM CHLORIDE 0.9% FLUSH
3.0000 mL | Freq: Two times a day (BID) | INTRAVENOUS | Status: DC
  Administered 2018-07-04: 3 mL via INTRAVENOUS

## 2018-07-03 MED ORDER — ASPIRIN EC 81 MG PO TBEC
81.0000 mg | DELAYED_RELEASE_TABLET | Freq: Every day | ORAL | Status: DC
  Administered 2018-07-04 – 2018-07-05 (×2): 81 mg via ORAL
  Filled 2018-07-03 (×2): qty 1

## 2018-07-03 MED ORDER — POTASSIUM CHLORIDE CRYS ER 20 MEQ PO TBCR
40.0000 meq | EXTENDED_RELEASE_TABLET | Freq: Once | ORAL | Status: AC
  Administered 2018-07-03: 40 meq via ORAL
  Filled 2018-07-03: qty 2

## 2018-07-03 MED ORDER — LATANOPROST 0.005 % OP SOLN
1.0000 [drp] | Freq: Every day | OPHTHALMIC | Status: DC
  Administered 2018-07-04: 1 [drp] via OPHTHALMIC
  Filled 2018-07-03: qty 2.5

## 2018-07-03 MED ORDER — OCUVITE-LUTEIN PO CAPS
1.0000 | ORAL_CAPSULE | Freq: Every day | ORAL | Status: DC
  Filled 2018-07-03: qty 1

## 2018-07-03 MED ORDER — HEPARIN (PORCINE) 25000 UT/250ML-% IV SOLN
1200.0000 [IU]/h | INTRAVENOUS | Status: DC
  Administered 2018-07-03: 700 [IU]/h via INTRAVENOUS
  Administered 2018-07-04: 1100 [IU]/h via INTRAVENOUS
  Filled 2018-07-03 (×2): qty 250

## 2018-07-03 MED ORDER — POTASSIUM CHLORIDE CRYS ER 20 MEQ PO TBCR
40.0000 meq | EXTENDED_RELEASE_TABLET | Freq: Once | ORAL | Status: AC
  Administered 2018-07-04: 40 meq via ORAL
  Filled 2018-07-03: qty 2

## 2018-07-03 MED ORDER — SODIUM CHLORIDE 0.9 % IV SOLN
INTRAVENOUS | Status: AC
  Administered 2018-07-04: 04:00:00 via INTRAVENOUS

## 2018-07-03 MED ORDER — BRIMONIDINE TARTRATE 0.15 % OP SOLN
1.0000 [drp] | Freq: Three times a day (TID) | OPHTHALMIC | Status: DC
  Filled 2018-07-03: qty 5

## 2018-07-03 MED ORDER — SODIUM CHLORIDE 0.9% FLUSH
3.0000 mL | Freq: Two times a day (BID) | INTRAVENOUS | Status: DC
  Administered 2018-07-04 – 2018-07-05 (×2): 3 mL via INTRAVENOUS

## 2018-07-03 MED ORDER — DILTIAZEM HCL-DEXTROSE 100-5 MG/100ML-% IV SOLN (PREMIX)
5.0000 mg/h | INTRAVENOUS | Status: DC
  Filled 2018-07-03: qty 100

## 2018-07-03 MED ORDER — ONDANSETRON HCL 4 MG PO TABS: 4.0000 mg | ORAL_TABLET | Freq: Four times a day (QID) | ORAL | Status: DC | PRN

## 2018-07-03 MED ORDER — QUETIAPINE 12.5 MG HALF TABLET: 12.5000 mg | ORAL_TABLET | Freq: Every day | ORAL | Status: DC

## 2018-07-03 MED ORDER — ACETAMINOPHEN 325 MG PO TABS: 650.0000 mg | ORAL_TABLET | Freq: Four times a day (QID) | ORAL | Status: DC | PRN

## 2018-07-03 MED ORDER — ATORVASTATIN CALCIUM 10 MG PO TABS
5.0000 mg | ORAL_TABLET | Freq: Every day | ORAL | Status: DC
  Administered 2018-07-04: 5 mg via ORAL
  Filled 2018-07-03: qty 1
  Filled 2018-07-03: qty 0.5

## 2018-07-03 MED ORDER — METHOCARBAMOL 500 MG PO TABS: 500.0000 mg | ORAL_TABLET | Freq: Three times a day (TID) | ORAL | Status: DC | PRN

## 2018-07-03 MED ORDER — OXYCODONE HCL 5 MG PO TABS: 5.0000 mg | ORAL_TABLET | ORAL | Status: DC | PRN

## 2018-07-03 MED ORDER — TIMOLOL MALEATE 0.5 % OP SOLN
1.0000 [drp] | Freq: Two times a day (BID) | OPHTHALMIC | Status: DC
  Administered 2018-07-04 – 2018-07-05 (×2): 1 [drp] via OPHTHALMIC
  Filled 2018-07-03: qty 5

## 2018-07-03 MED ORDER — ACETAMINOPHEN 650 MG RE SUPP: 650.0000 mg | Freq: Four times a day (QID) | RECTAL | Status: DC | PRN

## 2018-07-03 MED ORDER — MUSCLE RUB 10-15 % EX CREA: 1.0000 "application " | TOPICAL_CREAM | Freq: Two times a day (BID) | CUTANEOUS | Status: DC | PRN

## 2018-07-03 MED ORDER — BRINZOLAMIDE 1 % OP SUSP
1.0000 [drp] | Freq: Three times a day (TID) | OPHTHALMIC | Status: DC
  Filled 2018-07-03: qty 10

## 2018-07-03 MED ORDER — METOPROLOL TARTRATE 12.5 MG HALF TABLET
12.5000 mg | ORAL_TABLET | Freq: Two times a day (BID) | ORAL | Status: DC
  Administered 2018-07-03 – 2018-07-05 (×4): 12.5 mg via ORAL
  Filled 2018-07-03 (×4): qty 1

## 2018-07-03 MED ORDER — ASPIRIN 81 MG PO CHEW
324.0000 mg | CHEWABLE_TABLET | Freq: Once | ORAL | Status: AC
  Administered 2018-07-03: 324 mg via ORAL
  Filled 2018-07-03: qty 4

## 2018-07-03 NOTE — ED Notes (Signed)
Informed RN-Bain of I-stat trop 0.68

## 2018-07-03 NOTE — Progress Notes (Signed)
ANTICOAGULATION CONSULT NOTE - Initial Consult  Pharmacy Consult for heparin Indication: chest pain/ACS  Allergies  Allergen Reactions  . Temazepam   . Tramadol     Fine when taking it- did not sleep for 6 weeks after stopping medication  . Formaldehyde Rash  . Nickel Rash    Patient Measurements: Height: 5\' 4"  (162.6 cm) Weight: 130 lb (59 kg) IBW/kg (Calculated) : 54.7 Heparin Dosing Weight: 59 kg   Vital Signs: Temp: 98 F (36.7 C) (12/03 1814) Temp Source: Oral (12/03 1814) BP: 109/75 (12/03 1814) Pulse Rate: 82 (12/03 1814)  Labs: Recent Labs    07/03/18 1833  HGB 10.0*  HCT 31.5*  PLT 228  CREATININE 1.22*    Estimated Creatinine Clearance: 32.3 mL/min (A) (by C-G formula based on SCr of 1.22 mg/dL (H)).   Medical History: Past Medical History:  Diagnosis Date  . Anemia   . Arthritis   . 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 Complication of anesthesia    "Blood pressure has gone too low before and effected vision" just with general  . Complication of anesthesia    "woke up in severe panic attack" just with general  . Glaucoma   . Headache    hx of   . Herpes zoster with unspecified complication   . Hip dysplasia    right side  . History of transfusion 2014   2 units, no reaction-after knee replacement  . Hyperlipidemia    under control  . Macular degeneration   . Osteoarthrosis, unspecified whether generalized or localized, unspecified site   . Right hip pain    hx     Medications:   (Not in a hospital admission)  Assessment: 61 yof who presents with tachycardia found to have AFib w/ RVR. Initial troponin was elevated at 0.68. Pharmacy consulted to start IV heparin for ACS. She is slightly anemic. Plt wnl  Goal of Therapy:  Heparin level 0.3-0.7 units/ml Monitor platelets by anticoagulation protocol: Yes   Plan:  -Heparin 3550 units IV bolus per MD, then start IV heparin at 700 units/hr -F/u 8 hr HL -Monitor daily HL, CBC, and  s/s of bleeding   Albertina Parr, PharmD., BCPS Clinical Pharmacist Clinical phone for 07/03/18 until 11pm: [phone]

## 2018-07-03 NOTE — ED Provider Notes (Signed)
[facility] EMERGENCY DEPARTMENT Provider Note   CSN: [id number] Arrival date & time: 07/03/18  1806     History   Chief Complaint Chief Complaint  Patient presents with  . Atrial Fibrillation    HPI Meredith Moore is a 82 y.o. female with PMH/o anemnia, HLD, who presents for evaluation of fast heart rate.  Patient reports that for the last 3 to 4 days, she has had cough, nasal congestion, chills.  She went to urgent care for evaluation of this.  At urgent care, they noticed that her heart rate was extremely fast they did an EKG that was concerning for A. fib with RVR.  Patient was sent to the emergency department immediately for further evaluation.  She states that she never had any chest pain.  She states that she only went to the doctor because of the cough, nasal congestion, chills she has been having.  She states she has no prior history of cardiac abnormalities.  She has never seen a cardiologist.  He has no prior history of A. fib.  Patient states she has not had any shortness of breath or chest pain while at home.  Patient denies any fevers, abdominal pain, nausea/vomiting.  She does not smoke.  She denies any history of hypertension or diabetes.  She denies any personal or family cardiac history.  The history is provided by the patient.    Past Medical History:  Diagnosis Date  . Anemia   . Arthritis   . Blind 10/12/2016   Has learned Braille  . Complication of anesthesia    "Blood pressure has gone too low before and effected vision" just with general  . Complication of anesthesia    "woke up in severe panic attack" just with general  . Glaucoma   . Headache    hx of   . Herpes zoster with unspecified complication   . Hip dysplasia    right side  . History of transfusion 2014   2 units, no reaction-after knee replacement  . Hyperlipidemia    under control  . Macular degeneration   . Osteoarthrosis, unspecified whether generalized or  localized, unspecified site   . Right hip pain    hx     Patient Active Problem List   Diagnosis Date Noted  . Paroxysmal atrial tachycardia ([place]) 07/03/2018  . Hypokalemia 07/03/2018  . Normocytic anemia 07/03/2018  . Mild renal insufficiency 07/03/2018  . Elevated troponin 07/03/2018  . Viral gastroenteritis 07/03/2018  . S/p reverse total shoulder arthroplasty 12/21/2017  . Blind 10/12/2016  . Medication management 10/12/2016  . Insomnia 10/07/2015  . Glaucoma 10/07/2015  . OA (osteoarthritis) of hip 06/04/2014  . Macular degeneration 09/18/2013  . Osteopenia 09/18/2013  . Hyperlipidemia 03/14/2013  . OA (osteoarthritis) of knee 09/10/2012    Past Surgical History:  Procedure Laterality Date  . APPENDECTOMY  1979  . BELPHAROPTOSIS REPAIR     eye lid drooping repair  . Left hip arthroplasty     05/10/17 Dr. Wynelle Link  . ovarian tumor   1979   benign tumor removed   . REVERSE SHOULDER ARTHROPLASTY Left 12/21/2017   Procedure: LEFT REVERSE SHOULDER ARTHROPLASTY;  Surgeon: Justice Britain, MD;  Location: [facility];  Service: Orthopedics;  Laterality: Left;  . ROBOTIC ASSISTED TOTAL HYSTERECTOMY WITH BILATERAL SALPINGO OOPHERECTOMY Bilateral 03/25/2014   Procedure: ROBOTIC ASSISTED TOTAL HYSTERECTOMY WITH BILATERAL SALPINGO OOPHORECTOMY;  Surgeon: Everitt Amber, MD;  Location: [facility];  Service: Gynecology;  Laterality: Bilateral;  .  TONSILLECTOMY     1945   . TOTAL HIP ARTHROPLASTY Right 06/04/2014   Procedure: RIGHT TOTAL HIP ARTHROPLASTY ANTERIOR APPROACH;  Surgeon: Gearlean Alf, MD;  Location: [facility];  Service: Orthopedics;  Laterality: Right;  . TOTAL HIP ARTHROPLASTY Left 05/10/2017   Procedure: LEFT TOTAL HIP ARTHROPLASTY ANTERIOR APPROACH;  Surgeon: Gaynelle Arabian, MD;  Location: [facility];  Service: Orthopedics;  Laterality: Left;  90 mins  . TOTAL KNEE ARTHROPLASTY Right 09/10/2012   Procedure: RIGHT TOTAL KNEE ARTHROPLASTY;  Surgeon: Gearlean Alf, MD;  Location: [facility];   Service: Orthopedics;  Laterality: Right;     OB History   None      Home Medications    Prior to Admission medications   Medication Sig Start Date End Date Taking? Authorizing Provider  acetaminophen (TYLENOL) 500 MG tablet Take 1,000 mg by mouth every 6 (six) hours as needed (for pain. (scheduled in the morning)).     [provider]  aspirin EC 81 MG tablet Take 81 mg daily by mouth.    [provider]  atorvastatin (LIPITOR) 10 MG tablet Take 1/2 (one-half) tablet by mouth at bedtime for cholesterol Patient taking differently: Take 5 mg by mouth at bedtime. Take 1/2 (one-half) tablet by mouth at bedtime for cholesterol 06/13/17   Lauree Chandler, NP  Calcium Carb-Cholecalciferol (CALCIUM+D3 PO) Take 1 tablet by mouth daily.    [provider]  Cholecalciferol (VITAMIN D3 PO) Take 1 tablet by mouth daily.    [provider]  latanoprost (XALATAN) 0.005 % ophthalmic solution Place 1 drop into both eyes at bedtime. 2300    [provider]  Liniments (SALONPAS ARTHRITIS PAIN RELIEF EX) Apply 1 application topically 4 (four) times daily as needed (for shoulder pain.).    [provider]  Liniments (SALONPAS PAIN RELIEF PATCH EX) Place 1 patch onto the skin daily as needed (for shoulder pain.).    [provider]  meclizine (ANTIVERT) 12.5 MG tablet Take one tablet by mouth up to four times daily as needed for vertigo Patient taking differently: Take 12.5 mg by mouth 4 (four) times daily as needed (for vertigo).  11/23/16   Estill Dooms, MD  methocarbamol (ROBAXIN) 500 MG tablet Take 1 tablet (500 mg total) by mouth every 8 (eight) hours as needed for muscle spasms. 12/22/17   Shuford, Olivia Mackie, PA-C  Multiple Vitamins-Minerals (PRESERVISION AREDS 2+MULTI VIT) CAPS Take 1 tablet by mouth 2 (two) times daily.    [provider]  ondansetron (ZOFRAN) 4 MG tablet Take 1 tablet (4 mg total) by mouth every 8 (eight) hours as  needed for nausea or vomiting. 12/22/17   Shuford, Olivia Mackie, PA-C  oxyCODONE (ROXICODONE) 5 MG immediate release tablet Take 1 tablet (5 mg total) by mouth every 4 (four) hours as needed for severe pain. 12/22/17 12/22/18  Shuford, Olivia Mackie, PA-C  QUEtiapine (SEROQUEL) 25 MG tablet 1/2-1 tablet at night as needed for rest Patient taking differently: Take 12.5 mg by mouth at bedtime.  06/14/17   Lauree Chandler, NP  senna (SENOKOT) 8.6 MG TABS tablet Take 1-2 tablets by mouth at bedtime as needed (for constipation.).    [provider]  SIMBRINZA 1-0.2 % SUSP Place 1 drop into both eyes 3 (three) times daily with meals.  03/30/17   [provider]  timolol (TIMOPTIC) 0.5 % ophthalmic solution Place 1 drop into both eyes 2 (two) times daily. Breakfast & dinner.    [provider]  trolamine salicylate (ASPERCREME) 10 % cream Apply 1 application topically 2 (two) times daily as needed (for shoulder pain.).    [provider]    Family History Family History  Problem Relation Age of Onset  . Heart disease Mother   . Heart disease Father     Social History Social History   Tobacco Use  . Smoking status: Former Smoker    Packs/day: 0.25    Years: 15.00    Pack years: 3.75    Last attempt to quit: 08/01/1988    Years since quitting: 29.9  . Smokeless tobacco: Never Used  Substance Use Topics  . Alcohol use: No  . Drug use: No     Allergies   Temazepam; Tramadol; Formaldehyde; and Nickel   Review of Systems Review of Systems  Constitutional: Positive for chills. Negative for fever.  HENT: Positive for congestion.   Respiratory: Positive for cough. Negative for shortness of breath.   Cardiovascular: Negative for chest pain.  Gastrointestinal: Negative for abdominal pain, nausea and vomiting.  Genitourinary: Negative for dysuria and hematuria.  Neurological: Negative for weakness, numbness and headaches.     Physical Exam Updated Vital Signs BP  117/72   Pulse 88   Temp 98 F (36.7 C) (Oral)   Resp 15   Ht 5\' 4"  (1.626 m)   Wt 59 kg   SpO2 96%   BMI 22.31 kg/m   Physical Exam  Constitutional: She is oriented to person, place, and time. She appears well-developed and well-nourished.  HENT:  Head: Normocephalic and atraumatic.  Mouth/Throat: Oropharynx is clear and moist and mucous membranes are normal.  Eyes: Pupils are equal, round, and reactive to light. Conjunctivae, EOM and lids are normal.  Neck: Full passive range of motion without pain.  Cardiovascular: Normal heart sounds and normal pulses. An irregularly irregular rhythm present. Tachycardia present. Exam reveals no gallop and no friction rub.  No murmur heard. Pulses:      Radial pulses are 2+ on the right side, and 2+ on the left side.       Dorsalis pedis pulses are 2+ on the right side, and 2+ on the left side.  Pulmonary/Chest: Effort normal and breath sounds normal.  Lungs clear to auscultation bilaterally.  Symmetric chest rise.  No wheezing, rales, rhonchi.  Abdominal: Soft. Normal appearance. There is no tenderness. There is no rigidity and no guarding.  Abdomen is soft, non-distended, non-tender. No rigidity, No guarding. No peritoneal signs.  Musculoskeletal: Normal range of motion.  Neurological: She is alert and oriented to person, place, and time.  Follows commands, Moves all extremities  5/5 strength to BUE and BLE  Sensation intact throughout all major nerve distributions  Skin: Skin is warm and dry. Capillary refill takes less than 2 seconds.  Psychiatric: She has a normal mood and affect. Her speech is normal.  Nursing note and vitals reviewed.    ED Treatments / Results  Labs (all labs ordered are listed, but only abnormal results are displayed) Labs Reviewed  BASIC METABOLIC PANEL - Abnormal; Notable for the following components:      Result Value   Potassium 3.2 (*)    CO2 20 (*)    Glucose, Bld 149 (*)    BUN 26 (*)    Creatinine,  Ser 1.22 (*)    Calcium 8.6 (*)    GFR calc non Af Amer 42 (*)    GFR calc Af Amer 49 (*)    All other components  within normal limits  CBC - Abnormal; Notable for the following components:   RBC 3.38 (*)    Hemoglobin 10.0 (*)    HCT 31.5 (*)    All other components within normal limits  HEPATIC FUNCTION PANEL - Abnormal; Notable for the following components:   Total Protein 5.4 (*)    Albumin 3.0 (*)    AST 46 (*)    ALT 48 (*)    All other components within normal limits  URINALYSIS, ROUTINE W REFLEX MICROSCOPIC - Abnormal; Notable for the following components:   Color, Urine AMBER (*)    APPearance HAZY (*)    Ketones, ur 5 (*)    Protein, ur 30 (*)    Leukocytes, UA SMALL (*)    Bacteria, UA RARE (*)    All other components within normal limits  MAGNESIUM - Abnormal; Notable for the following components:   Magnesium 1.6 (*)    All other components within normal limits  I-STAT TROPONIN, ED - Abnormal; Notable for the following components:   Troponin i, poc 0.68 (*)    All other components within normal limits  I-STAT TROPONIN, ED - Abnormal; Notable for the following components:   Troponin i, poc 0.54 (*)    All other components within normal limits  LIPASE, BLOOD  INFLUENZA PANEL BY PCR (TYPE A & B)  TSH  HEPARIN LEVEL (UNFRACTIONATED)  CBC  TROPONIN I  BASIC METABOLIC PANEL  TROPONIN I  TROPONIN I  HEPATITIS PANEL, ACUTE  HEPATIC FUNCTION PANEL  I-STAT CG4 LACTIC ACID, ED    EKG EKG Interpretation  Date/Time:  Tuesday July 03 2018 18:31:42 EST Ventricular Rate:  128 PR Interval:    QRS Duration: 76 QT Interval:  292 QTC Calculation: 426 R Axis:   48 Text Interpretation:  Atrial fibrillation with rapid ventricular response Low voltage QRS Nonspecific ST and T wave abnormality , probably digitalis effect Abnormal ECG agree. Confirmed by Charlesetta Shanks [phone]) on 07/03/2018 7:24:40 PM   Radiology Dg Chest 2 View  Result Date: 07/03/2018 CLINICAL  DATA:  Tachycardia EXAM: CHEST - 2 VIEW COMPARISON:  07/22/2009 radiograph FINDINGS: UPPER limits normal heart size noted. Mild pulmonary vascular congestion identified with possible mild interstitial opacities. No pneumothorax or definite effusions. No airspace disease or pulmonary mass noted. No acute bony abnormalities are present. LEFT shoulder arthroplasty changes identified. IMPRESSION: Mild pulmonary vascular congestion with possible mild interstitial opacities which may represent very mild interstitial edema. Electronically Signed   By: Margarette Canada M.D.   On: 07/03/2018 21:26    Procedures .Critical Care Performed by: Volanda Napoleon, PA-C Authorized by: Volanda Napoleon, PA-C   Critical care provider statement:    Critical care time (minutes):  45   Critical care was necessary to treat or prevent imminent or life-threatening deterioration of the following conditions:  Cardiac failure and respiratory failure   Critical care was time spent personally by me on the following activities:  Discussions with consultants, evaluation of patient's response to treatment, examination of patient, ordering and performing treatments and interventions, ordering and review of laboratory studies, ordering and review of radiographic studies, pulse oximetry, re-evaluation of patient's condition, obtaining history from patient or surrogate and review of old charts   (including critical care time)  Medications Ordered in ED Medications  heparin ADULT infusion 100 units/mL (25000 units/210mL sodium chloride 0.45%) (700 Units/hr Intravenous New Bag/Given 07/03/18 2010)  metoprolol tartrate (LOPRESSOR) tablet 12.5 mg (12.5 mg Oral Given 07/03/18 2144)  potassium chloride SA (K-DUR,KLOR-CON) CR tablet 40 mEq (has no administration in time range)  aspirin EC tablet 81 mg (has no administration in time range)  oxyCODONE (Oxy IR/ROXICODONE) immediate release tablet 5 mg (has no administration in time range)    methocarbamol (ROBAXIN) tablet 500 mg (has no administration in time range)  PRESERVISION AREDS 2+MULTI VIT CAPS 1 tablet (has no administration in time range)  latanoprost (XALATAN) 0.005 % ophthalmic solution 1 drop (has no administration in time range)  brinzolamide (AZOPT) 1 % ophthalmic suspension 1 drop (has no administration in time range)  brimonidine (ALPHAGAN) 0.15 % ophthalmic solution 1 drop (has no administration in time range)  timolol (TIMOPTIC) 0.5 % ophthalmic solution 1 drop (has no administration in time range)  trolamine salicylate (ASPERCREME) 10 % cream 1 application (has no administration in time range)  sodium chloride flush (NS) 0.9 % injection 3 mL (has no administration in time range)  sodium chloride flush (NS) 0.9 % injection 3 mL (has no administration in time range)  sodium chloride flush (NS) 0.9 % injection 3 mL (has no administration in time range)  0.9 %  sodium chloride infusion (has no administration in time range)  acetaminophen (TYLENOL) tablet 650 mg (has no administration in time range)    Or  acetaminophen (TYLENOL) suppository 650 mg (has no administration in time range)  ondansetron (ZOFRAN) tablet 4 mg (has no administration in time range)    Or  ondansetron (ZOFRAN) injection 4 mg (has no administration in time range)  atorvastatin (LIPITOR) tablet 5 mg (has no administration in time range)  0.9 %  sodium chloride infusion (has no administration in time range)  heparin injection 3,550 Units (3,550 Units Intravenous Given 07/03/18 2015)  aspirin chewable tablet 324 mg (324 mg Oral Given 07/03/18 2143)  potassium chloride SA (K-DUR,KLOR-CON) CR tablet 40 mEq (40 mEq Oral Given 07/03/18 2144)     Initial Impression / Assessment and Plan / ED Course  I have reviewed the triage vital signs and the nursing notes.  Pertinent labs & imaging results that were available during my care of the patient were reviewed by me and considered in my medical  decision making (see chart for details).     82 year old female who presents for evaluation of increased heart rate.  Went to urgent care for evaluation of nasal congestion, cough, chills which she has been having for the last 2 days.  At that time, her weight was found to be elevated she was sent to the emergency department.  No previous cardiac history.  No chest pain, shortness of breath at this time. Patient is afebrile, non-toxic appearing, sitting comfortably on examination table. Vital signs reviewed.  On my evaluation, patient is tacky with a regular Lee irregular rate.  Concern for A. fib with RVR.  During my initial history, she is fluctuating between 140-150s heart rate.  Usual labs and imaging ordered at triage.  EKG reviewed.  Question A. fib versus atrial tachycardia.  It does appear slightly irregular, especially in lead II.   BMP shows potassium 3.2.  Bicarb 20.  BUN is 26 and creatinine is 1.22.  CBC shows hemoglobin of 10.0.  I-STAT troponin is 0.68.  Given A. fib with RVR, will plan to start patient on diltiazem drip.  Given elevated troponin, will plan for heparin bolus.  Discussed with Dr. Marlou Porch (Cardiology).  He agrees with plan for diltiazem and heparin.  He will plan to see the patient in consultation.  Updated  patient on plan.  She is agreeable.   Discussed patient with Dr. Myna Hidalgo [facility]). He will accept patient for admission.   Final Clinical Impressions(s) / ED Diagnoses   Final diagnoses:  Atrial fibrillation with RVR [facility])  Elevated troponin    ED Discharge Orders    None       Desma Mcgregor 07/03/18 2259    Charlesetta Shanks, MD 07/11/18 [phone]

## 2018-07-03 NOTE — ED Provider Notes (Addendum)
Medical screening examination/treatment/procedure(s) were conducted as a shared visit with non-physician practitioner(s) and myself.  I personally evaluated the patient during the encounter.  EKG Interpretation  Date/Time:  Tuesday July 03 2018 18:31:42 EST Ventricular Rate:  128 PR Interval:    QRS Duration: 76 QT Interval:  292 QTC Calculation: 426 R Axis:   48 Text Interpretation:  Atrial fibrillation with rapid ventricular response Low voltage QRS Nonspecific ST and T wave abnormality , probably digitalis effect Abnormal ECG agree. Confirmed by Charlesetta Shanks [phone]) on 07/03/2018 7:24:40 PM   Patient reports she started having a lot of chills 3 days ago.  She reports that she could not get warm.  She reports she felt extremely weak but denies general myalgias.  She reports she had a very slight cough.  She did feel short of breath with activity.  She denies palpitations or chest pain.  No syncope or near syncope.  She reports he did have diminished appetite.  Husband reports a slight amount of diarrhea.  Patient is alert and appropriate.  No confusion.  No respiratory distress.  Heart tachycardic, irregularly irregular.  Lungs grossly clear.  Abdomen is soft and nontender.  No peripheral edema.  Calves soft and nontender.  Patient is alert with all movements coordinated purposeful symmetric.  Skin is warm and dry.  Patient presents with a combination of what sounds like infectious possible URI.  Will test for influenza, UTI and pneumonia.  Minimal GI symptoms.  Patient's first EKG is tachycardic and appears consistent with paroxysmal atrial fibrillation, there is a first segment of the rhythm strips that appears to have sinus P waves and then a tachycardic segment that is irregular without regular P waves.  Patient has been given a Cardizem dose and rate has slowed to the low 100s to 90s, at this time, as I observe the monitor, rhythm appears to be predominantly sinus with short periods of  increased rate without clear P waves.  Patient is asymptomatic for this.  Patient's troponin is mildly elevated.  She does not have any active chest pain.  Possibly NSTEMI with paroxysmal atrial fibrillation although the patient's initial symptoms are much more suggestive of infectious etiology triggering A. fib.  Have advised to start the patient on heparin and she has had one Cardizem dose.  She has tolerated this very well.  Patient will need admission by cardiology for ongoing monitoring, rule out for MI and observation for possible respiratory illness.   Charlesetta Shanks, MD 07/03/18 Lona Kettle    Charlesetta Shanks, MD 07/03/18 2000

## 2018-07-03 NOTE — Consult Note (Addendum)
Cardiology Consult    Patient ID: AYN DOMANGUE MRN: [id number], DOB/AGE: Jul 21, 1936   Admit date: 07/03/2018 Date of Consult: 07/03/2018  Primary Physician: Dineen Kid, MD Primary Cardiologist: New - M. Marlou Porch, MD  Requesting Provider: Jerilynn Mages. Johnney Killian, MD  Patient Profile    Meredith Moore is a 82 y.o. female with a history of OA, DJD, blindness, and HL, who is being seen today for the evaluation of PAT and elevated troponin at the request of Dr. Johnney Killian.  Past Medical History   Past Medical History:  Diagnosis Date  . Anemia   . Arthritis   . Blind 10/12/2016   Has learned Braille  . Complication of anesthesia    "Blood pressure has gone too low before and effected vision" just with general  . Complication of anesthesia    "woke up in severe panic attack" just with general  . Glaucoma   . Headache    hx of   . Herpes zoster with unspecified complication   . Hip dysplasia    right side  . History of transfusion 2014   2 units, no reaction-after knee replacement  . Hyperlipidemia    under control  . Macular degeneration   . Osteoarthrosis, unspecified whether generalized or localized, unspecified site   . Right hip pain    hx     Past Surgical History:  Procedure Laterality Date  . APPENDECTOMY  1979  . BELPHAROPTOSIS REPAIR     eye lid drooping repair  . Left hip arthroplasty     05/10/17 Dr. Wynelle Link  . ovarian tumor   1979   benign tumor removed   . REVERSE SHOULDER ARTHROPLASTY Left 12/21/2017   Procedure: LEFT REVERSE SHOULDER ARTHROPLASTY;  Surgeon: Justice Britain, MD;  Location: [facility];  Service: Orthopedics;  Laterality: Left;  . ROBOTIC ASSISTED TOTAL HYSTERECTOMY WITH BILATERAL SALPINGO OOPHERECTOMY Bilateral 03/25/2014   Procedure: ROBOTIC ASSISTED TOTAL HYSTERECTOMY WITH BILATERAL SALPINGO OOPHORECTOMY;  Surgeon: Everitt Amber, MD;  Location: [facility];  Service: Gynecology;  Laterality: Bilateral;  . TONSILLECTOMY     1945   . TOTAL HIP ARTHROPLASTY  Right 06/04/2014   Procedure: RIGHT TOTAL HIP ARTHROPLASTY ANTERIOR APPROACH;  Surgeon: Gearlean Alf, MD;  Location: [facility];  Service: Orthopedics;  Laterality: Right;  . TOTAL HIP ARTHROPLASTY Left 05/10/2017   Procedure: LEFT TOTAL HIP ARTHROPLASTY ANTERIOR APPROACH;  Surgeon: Gaynelle Arabian, MD;  Location: [facility];  Service: Orthopedics;  Laterality: Left;  90 mins  . TOTAL KNEE ARTHROPLASTY Right 09/10/2012   Procedure: RIGHT TOTAL KNEE ARTHROPLASTY;  Surgeon: Gearlean Alf, MD;  Location: [facility];  Service: Orthopedics;  Laterality: Right;    Allergies  Allergies  Allergen Reactions  . Temazepam   . Tramadol     Fine when taking it- did not sleep for 6 weeks after stopping medication  . Formaldehyde Rash  . Nickel Rash    History of Present Illness    82 year old female with a history of degenerative joint disease status post multiple joint replacements, osteoarthritis, blindness, and hyperlipidemia.  She has no prior cardiac history.  Despite having multiple medications on her home medicine list, she says she only takes Lipitor and eyedrops.  She is blind but relatively active without significant symptoms or limitations.  She walks regularly.  She was in her usual state of health until this past Saturday, November 30, she began to experience extreme chills with significant body aches, myalgias, joint pain, and subsequently developed watery diarrhea.  In  that setting, she has had no appetite and p.o. intake has been poor.  She has not had any fevers, chest pain, palpitations, or dyspnea.  Due to persistent symptoms she presented to the walk-in clinic at [facility] primary care today and was found to be tachycardic with concern for atrial fibrillation.  She was referred to the emergency department where initial ECG showed a predominantly regular rhythm with evidence of regular atrial activity.  A follow-up ECG a minute later initially shows sinus rhythm with redevelopment of what appears to be an  atrial tachycardia over the second two thirds of the ECG.  Blood pressure on presentation was 109/75 and she was afebrile.  She was given IV fluids and labs have revealed hypokalemia, with potassium of 3.2.  Creatinine is mildly elevated at 1.22 (1.01 in May 2019).  Without intervention, other than IVF, rate has steadily improved and she is currently in sinus in the 80's.  She was having brief, 5-10 second runs of atrial tachycardia.  At no point did she experience chest pain or dyspnea.  Further, she has not had any palpitations regardless of rate.  Troponins have returned elevated at 0.68 followed by 0.54.  She is currently symptom-free.  Home Medications    Prior to Admission medications   Medication Sig Start Date End Date Taking? Authorizing Provider  acetaminophen (TYLENOL) 500 MG tablet Take 1,000 mg by mouth every 6 (six) hours as needed (for pain. (scheduled in the morning)).     [provider]  aspirin EC 81 MG tablet Take 81 mg daily by mouth.    [provider]  atorvastatin (LIPITOR) 10 MG tablet Take 1/2 (one-half) tablet by mouth at bedtime for cholesterol Patient taking differently: Take 5 mg by mouth at bedtime. Take 1/2 (one-half) tablet by mouth at bedtime for cholesterol 06/13/17   Lauree Chandler, NP  Calcium Carb-Cholecalciferol (CALCIUM+D3 PO) Take 1 tablet by mouth daily.    [provider]  Cholecalciferol (VITAMIN D3 PO) Take 1 tablet by mouth daily.    [provider]  latanoprost (XALATAN) 0.005 % ophthalmic solution Place 1 drop into both eyes at bedtime. 2300    [provider]  Liniments (SALONPAS ARTHRITIS PAIN RELIEF EX) Apply 1 application topically 4 (four) times daily as needed (for shoulder pain.).    [provider]  Liniments (SALONPAS PAIN RELIEF PATCH EX) Place 1 patch onto the skin daily as needed (for shoulder pain.).    [provider]  meclizine (ANTIVERT) 12.5 MG tablet Take one tablet by  mouth up to four times daily as needed for vertigo Patient taking differently: Take 12.5 mg by mouth 4 (four) times daily as needed (for vertigo).  11/23/16   Estill Dooms, MD  methocarbamol (ROBAXIN) 500 MG tablet Take 1 tablet (500 mg total) by mouth every 8 (eight) hours as needed for muscle spasms. 12/22/17   Shuford, Olivia Mackie, PA-C  Multiple Vitamins-Minerals (PRESERVISION AREDS 2+MULTI VIT) CAPS Take 1 tablet by mouth 2 (two) times daily.    [provider]  ondansetron (ZOFRAN) 4 MG tablet Take 1 tablet (4 mg total) by mouth every 8 (eight) hours as needed for nausea or vomiting. 12/22/17   Shuford, Olivia Mackie, PA-C  oxyCODONE (ROXICODONE) 5 MG immediate release tablet Take 1 tablet (5 mg total) by mouth every 4 (four) hours as needed for severe pain. 12/22/17 12/22/18  Shuford, Olivia Mackie, PA-C  QUEtiapine (SEROQUEL) 25 MG tablet 1/2-1 tablet at night as needed for rest Patient  taking differently: Take 12.5 mg by mouth at bedtime.  06/14/17   Lauree Chandler, NP  senna (SENOKOT) 8.6 MG TABS tablet Take 1-2 tablets by mouth at bedtime as needed (for constipation.).    [provider]  SIMBRINZA 1-0.2 % SUSP Place 1 drop into both eyes 3 (three) times daily with meals.  03/30/17   [provider]  timolol (TIMOPTIC) 0.5 % ophthalmic solution Place 1 drop into both eyes 2 (two) times daily. Breakfast & dinner.    [provider]  trolamine salicylate (ASPERCREME) 10 % cream Apply 1 application topically 2 (two) times daily as needed (for shoulder pain.).    [provider]     Family History    Family History  Problem Relation Age of Onset  . Heart disease Mother   . Heart disease Father    She indicated that her mother is deceased. She indicated that her father is deceased. She indicated that her brother is alive. She indicated that both of her daughters are alive. She indicated that her son is alive.   Social History    Social History   Socioeconomic  History  . Marital status: Married    Spouse name: Not on file  . Number of children: Not on file  . Years of education: Not on file  . Highest education level: Not on file  Occupational History  . Not on file  Social Needs  . Financial resource strain: Not on file  . Food insecurity:    Worry: Not on file    Inability: Not on file  . Transportation needs:    Medical: Not on file    Non-medical: Not on file  Tobacco Use  . Smoking status: Former Smoker    Packs/day: 0.25    Years: 15.00    Pack years: 3.75    Last attempt to quit: 08/01/1988    Years since quitting: 29.9  . Smokeless tobacco: Never Used  Substance and Sexual Activity  . Alcohol use: No  . Drug use: No  . Sexual activity: Yes  Lifestyle  . Physical activity:    Days per week: Not on file    Minutes per session: Not on file  . Stress: Not on file  Relationships  . Social connections:    Talks on phone: Not on file    Gets together: Not on file    Attends religious service: Not on file    Active member of club or organization: Not on file    Attends meetings of clubs or organizations: Not on file    Relationship status: Not on file  . Intimate partner violence:    Fear of current or ex partner: Not on file    Emotionally abused: Not on file    Physically abused: Not on file    Forced sexual activity: Not on file  Other Topics Concern  . Not on file  Social History Narrative   Married   Former smoker 1990   Alcohol none   Exercise walk daily about one mile, gym     Review of Systems    General:  +++ malaise and chills. No fever, night sweats or weight changes.  Cardiovascular:  No chest pain, dyspnea on exertion, edema, orthopnea, palpitations, paroxysmal nocturnal dyspnea. Dermatological: No rash, lesions/masses Respiratory: No cough, dyspnea Urologic: No hematuria, dysuria Abdominal:   No nausea, vomiting, +++ watery diarrhea, no bright red blood per rectum, melena, or  hematemesis Neurologic:  Chronic blindness.  No wkns, changes in mental status. All other systems reviewed and are otherwise negative except as noted above.  Physical Exam    Blood pressure 109/75, pulse 82, temperature 98 F (36.7 C), temperature source Oral, resp. rate 16, height 5\' 4"  (1.626 m), weight 59 kg, SpO2 97 %.  General: Pleasant, NAD Psych: Normal affect. Neuro: Alert and oriented X 3. Moves all extremities spontaneously. HEENT: Blind, otw nl.  Neck: Supple without bruits or JVD. Lungs:  Resp regular and unlabored, CTA. Heart: RRR no s3, s4, or murmurs. Abdomen: Soft, non-tender, non-distended, BS + x 4.  Extremities: No clubbing, cyanosis or edema. DP/PT/Radials 2+ and equal bilaterally.  Labs    Troponin [facility] of Care Test) Recent Labs    07/03/18 1936  TROPIPOC 0.54*   Lab Results  Component Value Date   WBC 9.6 07/03/2018   HGB 10.0 (L) 07/03/2018   HCT 31.5 (L) 07/03/2018   MCV 93.2 07/03/2018   PLT 228 07/03/2018    Recent Labs  Lab 07/03/18 1833  NA 135  K 3.2*  CL 103  CO2 20*  BUN 26*  CREATININE 1.22*  CALCIUM 8.6*  GLUCOSE 149*   Lab Results  Component Value Date   CHOL 192 10/11/2016   HDL 69 10/11/2016   LDLCALC 107 (H) 10/11/2016   TRIG 78 10/11/2016     Radiology Studies    No results found.  ECG & Cardiac Imaging    12/2 @ 18:31 - sinus rhythm followed by atrial tachycardia, nonspecific ST changes- personally reviewed.  Telemetry reviewed.  Predominantly regular rhythm with evidence of atrial activity suggestive of atrial tachycardia though cannot fully rule out atrial flutter when she was going fast.  As rate slow, you see sinus rhythm with intermittent atrial tachycardia for brief periods of time-5 to 10 seconds.  Currently in sinus rhythm.  Assessment & Plan    1.  Atrial tachycardia: Patient presented to the ED today on referral from [facility] walk-in clinic after a 4-day history of malaise, chills, joint aches, and  myalgias.  She has also had watery diarrhea.  Initially ED ECG shows a predominantly regular rhythm though some irregularity is noted.  Follow-up 1 minute later shows sinus rhythm followed by a run of atrial tachycardia.  On telemetry, when rates were elevated, it is difficult to discern atrial tachycardia versus flutter but as she slows, becomes more clear that she has sinus rhythm with brief runs of atrial tachycardia and now is just in sinus rhythm following administration of IV fluids.  Her potassium is low at 3.2 in the setting of diarrhea and poor p.o. intake.  Creatinine mildly elevated at 1.22.  Continue IV fluids.  I will add oral beta-blocker therapy and we will plan to follow-up echocardiogram.  Watch on telemetry overnight and assess for any potential A. Fib/flutter.  2.  Elevated troponin: Patient has not had any chest pain or dyspnea.  In the setting of malaise, poor p.o. intake, diarrhea, and hypokalemia, she has developed atrial tachycardia and in that setting, troponin is mildly elevated.  Initial finding of 0.68 and her second value just returned at 0.54.  Suspect this is demand ischemia in the setting of tachycardia.  We will follow-up in echocardiogram.  If LV function normal without wall motion abnormality's, she will require non-invasive ischemic testing-most likely Lexiscan Myoview.  Could also consider cardiac CTA though would worry that recurrent tachycardia would limit imaging.  Adding beta-blocker as above.  Continue aspirin and statin.  Heparinize for now and continue to follow trend.  3.  Viral illness: 4-day history of malaise, chills, joint aches, and watery diarrhea.  Suspect this was driving force behind tachycardia.  Creatinine mildly elevated and she is hypokalemic.  Receiving IV fluids.  Management per internal medicine.  4.  Hyperlipidemia: Continue statin therapy.  5.  Hypokalemia: Supplement.  6.  Mild renal insufficiency: Follow with IV fluids.  7.  Normocytic  anemia: This appears to be stable.  Signed, Murray Hodgkins, NP 07/03/2018, 8:25 PM  For questions or updates, please contact   Please consult [email or web address] for contact info under Cardiology/STEMI.   Personally seen and examined. Agree with above.  Currently appears quite comfortable, heart rate normal.  GEN: Thin, in no acute distress  HEENT: Blind effectively Neck: no JVD, carotid bruits, or masses Cardiac: RRR; no murmurs, rubs, or gallops,no edema  Respiratory:  clear to auscultation bilaterally, normal work of breathing GI: soft, nontender, nondistended, + BS MS: no deformity or atrophy  Skin: warm and dry, no rash Neuro:  Alert and Oriented x 3, Strength and sensation are intact Psych: euthymic mood, full affect   Upon close inspection of telemetry and multiple EKGs, it does appear that she is actually having paroxysmal atrial tachycardia and not atrial fibrillation.  Her troponin is mildly elevated at 0.68.  Let us continue the IV heparin.  If this becomes markedly elevated, she may require further invasive work-up.  Ultimately, this may be demand ischemia in the setting of her rapid ventricular response.  Beta-blocker has been added.  Echocardiogram.  If troponin remains fairly flat, consider further noninvasive testing when she is over this possible viral gastroenteritis.  Replete electrolyte derangement.  Candee Furbish, MD

## 2018-07-03 NOTE — ED Notes (Signed)
Luellen Pucker, RN to notify MD Pfeiffer of pt elevated troponin

## 2018-07-03 NOTE — H&P (Signed)
History and Physical    Meredith Moore NOB:[id number] DOB: Feb 03, 1936 DOA: 07/03/2018  PCP: Dineen Kid, MD   Patient coming from: Home   Chief Complaint: Chills, diarrhea, HR in 150's at urgent care   HPI: Meredith Moore is a 82 y.o. female with medical history significant for blindness, chronic normocytic anemia, chronic shoulder pain, and hyperlipidemia, now presenting to the emergency department for evaluation of tachycardia.  Patient reports several days of chills, nausea, general malaise, and loose stools.  She was seen briefly at an urgent care today for evaluation of the symptoms, noted to have heart rate in the 150s with EKG concerning for possible atrial fibrillation, and she was sent to the ED for further evaluation.  Patient reports some mild generalized abdominal discomfort, but no pain per se.  She reports some nausea, much worse after eating, but has not vomited.  Chills have improved, diarrhea persists but also seems to be improving, though she had 3 or 4 episodes today.  Due to her blindness, she is unable to say whether there is any blood or mucus in the stool.  She denies chest pain or palpitations.  She has had a mild nonproductive cough for the past couple days, but no dyspnea.  She denies any cardiac history.  She denies any alcohol or illicit drug use.  ED Course: Upon arrival to the ED, patient is found to be afebrile, saturating well on room air, and with vitals otherwise stable.  EKG features in atrial tachyarrhythmia with rate 160.  Chest x-ray is notable for mild pulmonary vascular congestion and possible very mild interstitial edema.  Chemistry panel is notable for potassium 3.2, slight elevation in transaminases, albumin 3.0, and creatinine 1.22, up from 1.0 last May.  CBC features a stable chronic normocytic anemia.  Troponin is elevated to 0.68.  Patient was given 324 mg of aspirin in the ED, started on heparin infusion, started on diltiazem infusion, and  cardiology was consulted by the ED physician.  Cardiology recommended a medical admission for further evaluation and management.  Review of Systems:  All other systems reviewed and apart from HPI, are negative.  Past Medical History:  Diagnosis Date  . Anemia   . Arthritis   . Blind 10/12/2016   Has learned Braille  . Complication of anesthesia    "Blood pressure has gone too low before and effected vision" just with general  . Complication of anesthesia    "woke up in severe panic attack" just with general  . Glaucoma   . Headache    hx of   . Herpes zoster with unspecified complication   . Hip dysplasia    right side  . History of transfusion 2014   2 units, no reaction-after knee replacement  . Hyperlipidemia    under control  . Macular degeneration   . Osteoarthrosis, unspecified whether generalized or localized, unspecified site   . Right hip pain    hx     Past Surgical History:  Procedure Laterality Date  . APPENDECTOMY  1979  . BELPHAROPTOSIS REPAIR     eye lid drooping repair  . Left hip arthroplasty     05/10/17 Dr. Wynelle Link  . ovarian tumor   1979   benign tumor removed   . REVERSE SHOULDER ARTHROPLASTY Left 12/21/2017   Procedure: LEFT REVERSE SHOULDER ARTHROPLASTY;  Surgeon: Justice Britain, MD;  Location: [facility];  Service: Orthopedics;  Laterality: Left;  . ROBOTIC ASSISTED TOTAL HYSTERECTOMY WITH BILATERAL SALPINGO OOPHERECTOMY Bilateral  03/25/2014   Procedure: ROBOTIC ASSISTED TOTAL HYSTERECTOMY WITH BILATERAL SALPINGO OOPHORECTOMY;  Surgeon: Everitt Amber, MD;  Location: [facility];  Service: Gynecology;  Laterality: Bilateral;  . TONSILLECTOMY     1945   . TOTAL HIP ARTHROPLASTY Right 06/04/2014   Procedure: RIGHT TOTAL HIP ARTHROPLASTY ANTERIOR APPROACH;  Surgeon: Gearlean Alf, MD;  Location: [facility];  Service: Orthopedics;  Laterality: Right;  . TOTAL HIP ARTHROPLASTY Left 05/10/2017   Procedure: LEFT TOTAL HIP ARTHROPLASTY ANTERIOR APPROACH;  Surgeon:  Gaynelle Arabian, MD;  Location: [facility];  Service: Orthopedics;  Laterality: Left;  90 mins  . TOTAL KNEE ARTHROPLASTY Right 09/10/2012   Procedure: RIGHT TOTAL KNEE ARTHROPLASTY;  Surgeon: Gearlean Alf, MD;  Location: [facility];  Service: Orthopedics;  Laterality: Right;     reports that she quit smoking about 29 years ago. She has a 3.75 pack-year smoking history. She has never used smokeless tobacco. She reports that she does not drink alcohol or use drugs.  Allergies  Allergen Reactions  . Temazepam   . Tramadol     Fine when taking it- did not sleep for 6 weeks after stopping medication  . Formaldehyde Rash  . Nickel Rash    Family History  Problem Relation Age of Onset  . Heart disease Mother   . Heart disease Father      Prior to Admission medications   Medication Sig Start Date End Date Taking? Authorizing Provider  acetaminophen (TYLENOL) 500 MG tablet Take 1,000 mg by mouth every 6 (six) hours as needed (for pain. (scheduled in the morning)).     [provider]  aspirin EC 81 MG tablet Take 81 mg daily by mouth.    [provider]  atorvastatin (LIPITOR) 10 MG tablet Take 1/2 (one-half) tablet by mouth at bedtime for cholesterol Patient taking differently: Take 5 mg by mouth at bedtime. Take 1/2 (one-half) tablet by mouth at bedtime for cholesterol 06/13/17   Lauree Chandler, NP  Calcium Carb-Cholecalciferol (CALCIUM+D3 PO) Take 1 tablet by mouth daily.    [provider]  Cholecalciferol (VITAMIN D3 PO) Take 1 tablet by mouth daily.    [provider]  latanoprost (XALATAN) 0.005 % ophthalmic solution Place 1 drop into both eyes at bedtime. 2300    [provider]  Liniments (SALONPAS ARTHRITIS PAIN RELIEF EX) Apply 1 application topically 4 (four) times daily as needed (for shoulder pain.).    [provider]  Liniments (SALONPAS PAIN RELIEF PATCH EX) Place 1 patch onto the skin daily as needed (for shoulder pain.).     [provider]  meclizine (ANTIVERT) 12.5 MG tablet Take one tablet by mouth up to four times daily as needed for vertigo Patient taking differently: Take 12.5 mg by mouth 4 (four) times daily as needed (for vertigo).  11/23/16   Estill Dooms, MD  methocarbamol (ROBAXIN) 500 MG tablet Take 1 tablet (500 mg total) by mouth every 8 (eight) hours as needed for muscle spasms. 12/22/17   Shuford, Olivia Mackie, PA-C  Multiple Vitamins-Minerals (PRESERVISION AREDS 2+MULTI VIT) CAPS Take 1 tablet by mouth 2 (two) times daily.    [provider]  ondansetron (ZOFRAN) 4 MG tablet Take 1 tablet (4 mg total) by mouth every 8 (eight) hours as needed for nausea or vomiting. 12/22/17   Shuford, Olivia Mackie, PA-C  oxyCODONE (ROXICODONE) 5 MG immediate release tablet Take 1 tablet (5 mg total) by mouth every 4 (four) hours as needed for severe pain. 12/22/17  12/22/18  Shuford, Olivia Mackie, PA-C  QUEtiapine (SEROQUEL) 25 MG tablet 1/2-1 tablet at night as needed for rest Patient taking differently: Take 12.5 mg by mouth at bedtime.  06/14/17   Lauree Chandler, NP  senna (SENOKOT) 8.6 MG TABS tablet Take 1-2 tablets by mouth at bedtime as needed (for constipation.).    [provider]  SIMBRINZA 1-0.2 % SUSP Place 1 drop into both eyes 3 (three) times daily with meals.  03/30/17   [provider]  timolol (TIMOPTIC) 0.5 % ophthalmic solution Place 1 drop into both eyes 2 (two) times daily. Breakfast & dinner.    [provider]  trolamine salicylate (ASPERCREME) 10 % cream Apply 1 application topically 2 (two) times daily as needed (for shoulder pain.).    [provider]    Physical Exam: Vitals:   07/03/18 2000 07/03/18 2015 07/03/18 2030 07/03/18 2045  BP: 120/72 121/76 111/80 117/72  Pulse: 87 90 94 88  Resp: (!) 25 (!) 24 (!) 21 15  Temp:      TempSrc:      SpO2: 100% 100% 100% 96%  Weight:      Height:        Constitutional: NAD, calm  Eyes: PERTLA, lids and  conjunctivae normal ENMT: Mucous membranes are moist. Posterior pharynx clear of any exudate or lesions.   Neck: normal, supple, no masses, no thyromegaly Respiratory: clear to auscultation bilaterally, no wheezing, no crackles. Normal respiratory effort.    Cardiovascular: S1 & S2 heard, regular rate and rhythm. No extremity edema.   Abdomen: No distension, no tenderness, soft. Bowel sounds active.  Musculoskeletal: no clubbing / cyanosis. No joint deformity upper and lower extremities.    Skin: no significant rashes, lesions, ulcers. Warm, dry, well-perfused. Neurologic: no facial asymmetry. Gross visual acuity deficit. Sensation intact. Moving all extremities.  Psychiatric: Alert and oriented x 3. Pleasant and cooperative.    Labs on Admission: I have personally reviewed following labs and imaging studies  CBC: Recent Labs  Lab 07/03/18 1833  WBC 9.6  HGB 10.0*  HCT 31.5*  MCV 93.2  PLT 790   Basic Metabolic Panel: Recent Labs  Lab 07/03/18 1833  NA 135  K 3.2*  CL 103  CO2 20*  GLUCOSE 149*  BUN 26*  CREATININE 1.22*  CALCIUM 8.6*   GFR: Estimated Creatinine Clearance: 32.3 mL/min (A) (by C-G formula based on SCr of 1.22 mg/dL (H)). Liver Function Tests: Recent Labs  Lab 07/03/18 2031  AST 46*  ALT 48*  ALKPHOS 65  BILITOT 0.7  PROT 5.4*  ALBUMIN 3.0*   Recent Labs  Lab 07/03/18 2031  LIPASE 46   No results for input(s): AMMONIA in the last 168 hours. Coagulation Profile: No results for input(s): INR, PROTIME in the last 168 hours. Cardiac Enzymes: No results for input(s): CKTOTAL, CKMB, CKMBINDEX, TROPONINI in the last 168 hours. BNP (last 3 results) No results for input(s): PROBNP in the last 8760 hours. HbA1C: No results for input(s): HGBA1C in the last 72 hours. CBG: No results for input(s): GLUCAP in the last 168 hours. Lipid Profile: No results for input(s): CHOL, HDL, LDLCALC, TRIG, CHOLHDL, LDLDIRECT in the last 72 hours. Thyroid  Function Tests: Recent Labs    07/03/18 2051  TSH 0.867   Anemia Panel: No results for input(s): VITAMINB12, FOLATE, FERRITIN, TIBC, IRON, RETICCTPCT in the last 72 hours. Urine analysis:    Component Value Date/Time   COLORURINE AMBER (A) 07/03/2018 2009   APPEARANCEUR  HAZY (A) 07/03/2018 2009   LABSPEC 1.021 07/03/2018 2009   PHURINE 5.0 07/03/2018 2009   GLUCOSEU NEGATIVE 07/03/2018 2009   HGBUR NEGATIVE 07/03/2018 2009   BILIRUBINUR NEGATIVE 07/03/2018 2009   KETONESUR 5 (A) 07/03/2018 2009   PROTEINUR 30 (A) 07/03/2018 2009   UROBILINOGEN 0.2 05/27/2014 1350   NITRITE NEGATIVE 07/03/2018 2009   LEUKOCYTESUR SMALL (A) 07/03/2018 2009   Sepsis Labs: @LABRCNTIP (procalcitonin:4,lacticidven:4) )No results found for this or any previous visit (from the past 240 hour(s)).   Radiological Exams on Admission: Dg Chest 2 View  Result Date: 07/03/2018 CLINICAL DATA:  Tachycardia EXAM: CHEST - 2 VIEW COMPARISON:  07/22/2009 radiograph FINDINGS: UPPER limits normal heart size noted. Mild pulmonary vascular congestion identified with possible mild interstitial opacities. No pneumothorax or definite effusions. No airspace disease or pulmonary mass noted. No acute bony abnormalities are present. LEFT shoulder arthroplasty changes identified. IMPRESSION: Mild pulmonary vascular congestion with possible mild interstitial opacities which may represent very mild interstitial edema. Electronically Signed   By: Margarette Canada M.D.   On: 07/03/2018 21:26    EKG: Independently reviewed. Atrial tachyarrhythmia, rate 160.   Assessment/Plan  1. Atrial tachycardia  - Presents with HR in 150's without chest pain or palpitations, noted at urgent care where she was seen for acute viral GE   - EKGs reviewed by cardiology, their consulatation much appreciated, likely paroxysmal atrial tachycardia precipitated by the acute viral illness  - Beta-blocker has been started  - Continue cardiac monitoring, check  echocardiogram, replace electrolytes, continue heparin infusion for now    2. Elevated troponin  - Troponin elevated to 0.68 in ED, then down to 0.59  - Appreciate cardiology eval, likely demand ischemia given absence of chest pain or SOB   - Continue cardiac monitoring, trend troponin, follow-up echo, continue ASA and statin, continue new beta-blocker, continue heparin infusion for now   3. Acute viral gastroenteritis  - Pt reports a few days of chills, nausea without vomiting, and diarrhea  - Chills have improved, diarrhea slowing but 3-4 episodes today  - No fever or leukocytosis on admission, abdomen is soft and non-tender  - Likely a viral GE, will continue supportive care with gentle IVF hydration and repletion of electrolytes, maintain enteric precautions for now   4. Elevated transaminases  - Slight elevations in AST and ALT noted, previously normal  - Abdominal exam is benign, bilirubin normal  - With acute viral GI illness, will check viral hepatitis panel, repeat LFT's in am    5. Mild renal insufficiency  - SCr is 1.22 on admission, up from 1.0 in May  - She is started on gentle IVF hydration given diarrhea; mild vascular congestion noted on CXR likely related to rapid rate which has normalized, will monitor respiratory status while hydrating, repeat chem panel in am    6. Normocytic anemia  - Hgb is 10.0 on admission, similar to priors  - Monitor closely while starting IV heparin infusion   7. Hypokalemia  - Serum potassium is 3.2 in ED  - Likely secondary to GI-losses  - Treated in ED with 80 mEq oral potassium  - Continue cardiac monitoring, repeat chem panel in am     DVT prophylaxis: IV heparin infusion  Code Status: Full  Family Communication: Husband updated at bedside Consults called: Cardiology  Admission status: Observation     Vianne Bulls, MD [facility] Pager [phone]  If 7PM-7AM, please contact night-coverage [email or web address] Password  [facility]  07/03/2018, 10:33 PM

## 2018-07-03 NOTE — ED Triage Notes (Signed)
Pt reports that she went to UC for URI symptoms and chills and was found to have tachycardia in the 150s. Denies chest pain, but endorses SOB.

## 2018-07-04 ENCOUNTER — Ambulatory Visit (HOSPITAL_BASED_OUTPATIENT_CLINIC_OR_DEPARTMENT_OTHER): Payer: Medicare Other

## 2018-07-04 DIAGNOSIS — I361 Nonrheumatic tricuspid (valve) insufficiency: Secondary | ICD-10-CM

## 2018-07-04 DIAGNOSIS — I471 Supraventricular tachycardia: Secondary | ICD-10-CM | POA: Diagnosis not present

## 2018-07-04 LAB — ECHOCARDIOGRAM COMPLETE
Height: 64 in
WEIGHTICAEL: 2080 [oz_av]

## 2018-07-04 LAB — HEPATIC FUNCTION PANEL
ALT: 48 U/L — ABNORMAL HIGH (ref 0–44)
AST: 42 U/L — ABNORMAL HIGH (ref 15–41)
Albumin: 2.9 g/dL — ABNORMAL LOW (ref 3.5–5.0)
Alkaline Phosphatase: 71 U/L (ref 38–126)
Bilirubin, Direct: 0.1 mg/dL (ref 0.0–0.2)
Indirect Bilirubin: 0.6 mg/dL (ref 0.3–0.9)
Total Bilirubin: 0.7 mg/dL (ref 0.3–1.2)
Total Protein: 5.3 g/dL — ABNORMAL LOW (ref 6.5–8.1)

## 2018-07-04 LAB — CBC
HCT: 28.1 % — ABNORMAL LOW (ref 36.0–46.0)
HEMOGLOBIN: 9 g/dL — AB (ref 12.0–15.0)
MCH: 29.9 pg (ref 26.0–34.0)
MCHC: 32 g/dL (ref 30.0–36.0)
MCV: 93.4 fL (ref 80.0–100.0)
Platelets: 193 10*3/uL (ref 150–400)
RBC: 3.01 MIL/uL — ABNORMAL LOW (ref 3.87–5.11)
RDW: 14.4 % (ref 11.5–15.5)
WBC: 7.3 10*3/uL (ref 4.0–10.5)
nRBC: 0 % (ref 0.0–0.2)

## 2018-07-04 LAB — BASIC METABOLIC PANEL
Anion gap: 9 (ref 5–15)
BUN: 22 mg/dL (ref 8–23)
CO2: 19 mmol/L — ABNORMAL LOW (ref 22–32)
Calcium: 7.9 mg/dL — ABNORMAL LOW (ref 8.9–10.3)
Chloride: 107 mmol/L (ref 98–111)
Creatinine, Ser: 1.21 mg/dL — ABNORMAL HIGH (ref 0.44–1.00)
GFR calc Af Amer: 49 mL/min — ABNORMAL LOW (ref >60)
GFR calc non Af Amer: 43 mL/min — ABNORMAL LOW (ref >60)
Glucose, Bld: 123 mg/dL — ABNORMAL HIGH (ref 70–99)
Potassium: 4 mmol/L (ref 3.5–5.1)
Sodium: 135 mmol/L (ref 135–145)

## 2018-07-04 LAB — TROPONIN I
TROPONIN I: 0.42 ng/mL — AB (ref <0.03)
Troponin I: 0.55 ng/mL (ref <0.03)
Troponin I: 0.31 ng/mL (ref <0.03)

## 2018-07-04 LAB — HEPARIN LEVEL (UNFRACTIONATED): Heparin Unfractionated: 0.1 IU/mL — ABNORMAL LOW (ref 0.30–0.70)

## 2018-07-04 MED ORDER — PROSIGHT PO TABS
1.0000 | ORAL_TABLET | Freq: Every day | ORAL | Status: DC
  Administered 2018-07-04 – 2018-07-05 (×2): 1 via ORAL
  Filled 2018-07-04 (×3): qty 1

## 2018-07-04 MED ORDER — HEPARIN BOLUS VIA INFUSION
2000.0000 [IU] | Freq: Once | INTRAVENOUS | Status: AC
  Administered 2018-07-04: 2000 [IU] via INTRAVENOUS
  Filled 2018-07-04: qty 2000

## 2018-07-04 NOTE — Progress Notes (Signed)
ANTICOAGULATION CONSULT NOTE - Follow Up Consult  Pharmacy Consult for heparin Indication: PAF/ACS  Labs: Recent Labs    07/03/18 1833 07/03/18 2149 07/04/18 0228  HGB 10.0*  --  9.0*  HCT 31.5*  --  28.1*  PLT 228  --  193  HEPARINUNFRC  --   --  <0.10*  CREATININE 1.22*  --   --   TROPONINI  --  0.61*  --     Assessment: 81yo female subtherapeutic on heparin with initial dosing for PAF and possible ACS; no gtt issues or signs of bleeding per RN.  Goal of Therapy:  Heparin level 0.3-0.7 units/ml   Plan:  Will rebolus with heparin 2000 units and increase heparin gtt by ~4 units/kg/hr to 900 units/hr and check level in 8 hours.    Wynona Neat, PharmD, BCPS  07/04/2018,3:44 AM

## 2018-07-04 NOTE — Progress Notes (Signed)
Progress Note  Patient Name: Meredith Moore Date of Encounter: 07/04/2018  Primary Cardiologist: Candee Furbish, MD   Subjective   Feels well  Inpatient Medications    Scheduled Meds: . aspirin EC  81 mg Oral Daily  . atorvastatin  5 mg Oral q1800  . brimonidine  1 drop Both Eyes TID  . brinzolamide  1 drop Both Eyes TID  . latanoprost  1 drop Both Eyes QHS  . metoprolol tartrate  12.5 mg Oral BID  . multivitamin  1 tablet Oral Daily  . sodium chloride flush  3 mL Intravenous Q12H  . sodium chloride flush  3 mL Intravenous Q12H  . timolol  1 drop Both Eyes BID   Continuous Infusions: . sodium chloride    . heparin 1,100 Units/hr (07/04/18 1610)   PRN Meds: sodium chloride, acetaminophen **OR** acetaminophen, methocarbamol, MUSCLE RUB, ondansetron **OR** ondansetron (ZOFRAN) IV, oxyCODONE, sodium chloride flush   Vital Signs    Vitals:   07/04/18 1330 07/04/18 1400 07/04/18 1415 07/04/18 1600  BP: 118/67 115/70 122/70 104/60  Pulse: 73 76 75 71  Resp: (!) 22 (!) 25 (!) 23 (!) 28  Temp:      TempSrc:      SpO2: 95% 99% 99% 94%  Weight:      Height:       No intake or output data in the 24 hours ending 07/04/18 1823 Filed Weights   07/03/18 1825  Weight: 59 kg    Telemetry    Sinus rhythm with PACs vs atrial tachycardia.   ECG    Paroxysmal atrial tachycardia - Personally Reviewed  Physical Exam   GEN: No acute distress.   Neck: No JVD Cardiac: regular rhythm, normal rate, no murmurs, rubs, or gallops.  Respiratory: Clear to auscultation bilaterally. GI: Soft, nontender, non-distended  MS: No edema; No deformity. Neuro:  Nonfocal  Psych: Normal affect   Labs    Chemistry Recent Labs  Lab 07/03/18 1833 07/03/18 2031 07/04/18 0228  NA 135  --  135  K 3.2*  --  4.0  CL 103  --  107  CO2 20*  --  19*  GLUCOSE 149*  --  123*  BUN 26*  --  22  CREATININE 1.22*  --  1.21*  CALCIUM 8.6*  --  7.9*  PROT  --  5.4* 5.3*  ALBUMIN  --  3.0*  2.9*  AST  --  46* 42*  ALT  --  48* 48*  ALKPHOS  --  65 71  BILITOT  --  0.7 0.7  GFRNONAA 42*  --  43*  GFRAA 49*  --  49*  ANIONGAP 12  --  9     Hematology Recent Labs  Lab 07/03/18 1833 07/04/18 0228  WBC 9.6 7.3  RBC 3.38* 3.01*  HGB 10.0* 9.0*  HCT 31.5* 28.1*  MCV 93.2 93.4  MCH 29.6 29.9  MCHC 31.7 32.0  RDW 14.3 14.4  PLT 228 193    Cardiac Enzymes Recent Labs  Lab 07/03/18 2149 07/04/18 0228 07/04/18 0905 07/04/18 1358  TROPONINI 0.61* 0.55* 0.42* 0.31*    Recent Labs  Lab 07/03/18 1832 07/03/18 1936  TROPIPOC 0.68* 0.54*     BNPNo results for input(s): BNP, PROBNP in the last 168 hours.   DDimer No results for input(s): DDIMER in the last 168 hours.   Radiology    Dg Chest 2 View  Result Date: 07/03/2018 CLINICAL DATA:  Tachycardia EXAM: CHEST -  2 VIEW COMPARISON:  07/22/2009 radiograph FINDINGS: UPPER limits normal heart size noted. Mild pulmonary vascular congestion identified with possible mild interstitial opacities. No pneumothorax or definite effusions. No airspace disease or pulmonary mass noted. No acute bony abnormalities are present. LEFT shoulder arthroplasty changes identified. IMPRESSION: Mild pulmonary vascular congestion with possible mild interstitial opacities which may represent very mild interstitial edema. Electronically Signed   By: Margarette Canada M.D.   On: 07/03/2018 21:26    Cardiac Studies   Study Conclusions - Echo 07/04/18  - Left ventricle: The cavity size was normal. Systolic function was   normal. The estimated ejection fraction was in the range of 55%   to 60%. Wall motion was normal; there were no regional wall   motion abnormalities. Features are consistent with a pseudonormal   left ventricular filling pattern, with concomitant abnormal   relaxation and increased filling pressure (grade 2 diastolic   dysfunction). - Aortic valve: There was trivial regurgitation. - Left atrium: The atrium was mildly  dilated. - Right atrium: The atrium was moderately dilated. - Atrial septum: No defect or patent foramen ovale was identified. - Tricuspid valve: There was mild-moderate regurgitation. - Pulmonary arteries: Systolic pressure was moderately increased.   PA peak pressure: 45 mm Hg (S).  Impressions:  - Normal LV EF, grade 2 diastolic dysfunction, elevated PA systolic   pressure. Mild-moderate TR.  Patient Profile    Meredith Moore is a 82 y.o. female with a history of OA, DJD, blindness, and HL, who is being seen today for the evaluation of PAT and elevated troponin at the request of Dr. Johnney Killian.  Assessment & Plan   Principal Problem:   Paroxysmal atrial tachycardia (HCC) Active Problems:   Hypokalemia   Normocytic anemia   Mild renal insufficiency   Elevated troponin   Viral gastroenteritis  No significant events on telemetry, brief 4-5 beat episode of atrial tachycardia vs PACs, no atrial fibrillation.  Can discontinue heparin. If stable overnight, consider outpatient stress test. Likely demand ischemia. She feels well and has no acute concerns.   She needs further evaluation for elevated pulmonary pressure, which can be performed as an outpatient.    [facility] will sign off.   Medication Recommendations:  Metoprolol tartrate 12.5 mg BID Other recommendations (labs, testing, etc):  Possible outpatient stress test vs coronary CTA Follow up as an outpatient:  7-10 days in CV clinic for symptoms, med titration, and +/- stress testing.  For questions or updates, please contact [place] Please consult [email or web address] for contact info under        Signed, Elouise Munroe, MD  07/04/2018, 6:23 PM

## 2018-07-04 NOTE — ED Notes (Signed)
Pt placed on bedside toilet.  

## 2018-07-04 NOTE — Progress Notes (Signed)
ANTICOAGULATION CONSULT NOTE - Follow Up Consult  Pharmacy Consult for heparin Indication: PAF/ACS  Labs: Recent Labs    07/03/18 1833 07/03/18 2149 07/04/18 0228 07/04/18 0905 07/04/18 1358  HGB 10.0*  --  9.0*  --   --   HCT 31.5*  --  28.1*  --   --   PLT 228  --  193  --   --   HEPARINUNFRC  --   --  <0.10*  --  0.10*  CREATININE 1.22*  --  1.21*  --   --   TROPONINI  --  0.61* 0.55* 0.42*  --     Assessment: 81yo female subtherapeutic on heparin with initial dosing for PAF and possible ACS. Heparin level returned subtherapeutic this morning. hgb 9, plts wnl, Scr 1.2.   Goal of Therapy:  Heparin level 0.3-0.7 units/ml     Plan:  -Increase heparin gtt to 1100 units/hr -Daily HL, CBC -Check level in 8 hours   Harvel Quale 07/04/2018 2:53 PM

## 2018-07-04 NOTE — Progress Notes (Addendum)
PROGRESS NOTE  Meredith Moore TMH:[id number] DOB: [birth year] DOA: 07/03/2018 PCP: Dineen Kid, MD  HPI/Recap of past 24 hours: Meredith Moore is a 82 y.o. female with medical history significant for blindness, chronic normocytic anemia, chronic shoulder pain, and hyperlipidemia, now presenting to the emergency department for evaluation of tachycardia.  Patient reports several days of chills, nausea, general malaise, and loose stools.  She was seen briefly at an urgent care today for evaluation of the symptoms, noted to have heart rate in the 150s with EKG concerning for possible atrial fibrillation, and she was sent to the ED for further evaluation.  Patient reports some mild generalized abdominal discomfort, but no pain per se.  She reports some nausea, much worse after eating, but has not vomited.  Chills have improved, diarrhea persists but also seems to be improving, though she had 3 or 4 episodes today.  Due to her blindness, she is unable to say whether there is any blood or mucus in the stool.  She denies chest pain or palpitations.  She has had a mild nonproductive cough for the past couple days, but no dyspnea.  She denies any cardiac history.  She denies any alcohol or illicit drug use.  07/04/18: Still some generalized weakness. States she feels weak and her appetite is poor. Denies chest pain.  Assessment/Plan: Principal Problem:   Paroxysmal atrial tachycardia (HCC) Active Problems:   Hypokalemia   Normocytic anemia   Mild renal insufficiency   Elevated troponin   Viral gastroenteritis  P. Atrial tachycardia On BB Cardiology following 2D echo On heparin drip as recs by cardio Continue to monitor on telemetry  Elevated troponin 2/2 demand ischemia peaked at 0.61  Trended down on hep drip Per cardiology possible non invasive testing  2D echo done today  Moderate pulmonary HTN Cardiology following  Chronic dCHF with grade 2 diastolic dysfunction Management per  cardiology  Generalized weakness, persistent Obtain orthostatic VS PT to assess  CKD3 Avoid dehydration and hypotension Avoid nephrotoxic agents Obtain BMP am  HLD C/w statin home med  Acute viral gastroenteritis, resolving  Chronic normocytic anemia Monitor on hep drip No sign of overt bleeding CBC am  Requires 2 midnights due to persistently elevated cardiac enzymes, on heparin drip at cardiology request, and requiring further cardiac workup. High risk for decompensation due to multiple comorbidities and advanced age.   DVT prophylaxis: IV heparin infusion  Code Status: Full  Family Communication: Updated daughter at bedside. Consults called: Cardiology     Objective: Vitals:   07/04/18 1330 07/04/18 1400 07/04/18 1415 07/04/18 1600  BP: 118/67 115/70 122/70 104/60  Pulse: 73 76 75 71  Resp: (!) 22 (!) 25 (!) 23 (!) 28  Temp:      TempSrc:      SpO2: 95% 99% 99% 94%  Weight:      Height:       No intake or output data in the 24 hours ending 07/04/18 1820 Filed Weights   07/03/18 1825  Weight: 59 kg    Exam:  . General: 82 y.o. year-old female well developed well nourished ill appearing.  Alert and oriented x3. . Cardiovascular: Irregular rate and rhythm with no rubs or gallops.  No thyromegaly or JVD noted.   [name] Respiratory: Clear to auscultation with no wheezes or rales. Good inspiratory effort. . Abdomen: Soft nontender nondistended with normal bowel sounds x4 quadrants. . Musculoskeletal: Trace lower extremity edema. 2/4 pulses in all 4 extremities. . Skin: No  ulcerative lesions noted or rashes . Psychiatry: Mood is appropriate for condition and setting   Data Reviewed: CBC: Recent Labs  Lab 07/03/18 1833 07/04/18 0228  WBC 9.6 7.3  HGB 10.0* 9.0*  HCT 31.5* 28.1*  MCV 93.2 93.4  PLT 228 025   Basic Metabolic Panel: Recent Labs  Lab 07/03/18 1833 07/03/18 2149 07/04/18 0228  NA 135  --  135  K 3.2*  --  4.0  CL 103  --  107  CO2  20*  --  19*  GLUCOSE 149*  --  123*  BUN 26*  --  22  CREATININE 1.22*  --  1.21*  CALCIUM 8.6*  --  7.9*  MG  --  1.6*  --    GFR: Estimated Creatinine Clearance: 32.6 mL/min (A) (by C-G formula based on SCr of 1.21 mg/dL (H)). Liver Function Tests: Recent Labs  Lab 07/03/18 2031 07/04/18 0228  AST 46* 42*  ALT 48* 48*  ALKPHOS 65 71  BILITOT 0.7 0.7  PROT 5.4* 5.3*  ALBUMIN 3.0* 2.9*   Recent Labs  Lab 07/03/18 2031  LIPASE 46   No results for input(s): AMMONIA in the last 168 hours. Coagulation Profile: No results for input(s): INR, PROTIME in the last 168 hours. Cardiac Enzymes: Recent Labs  Lab 07/03/18 2149 07/04/18 0228 07/04/18 0905 07/04/18 1358  TROPONINI 0.61* 0.55* 0.42* 0.31*   BNP (last 3 results) No results for input(s): PROBNP in the last 8760 hours. HbA1C: No results for input(s): HGBA1C in the last 72 hours. CBG: No results for input(s): GLUCAP in the last 168 hours. Lipid Profile: No results for input(s): CHOL, HDL, LDLCALC, TRIG, CHOLHDL, LDLDIRECT in the last 72 hours. Thyroid Function Tests: Recent Labs    07/03/18 2051  TSH 0.867   Anemia Panel: No results for input(s): VITAMINB12, FOLATE, FERRITIN, TIBC, IRON, RETICCTPCT in the last 72 hours. Urine analysis:    Component Value Date/Time   COLORURINE AMBER (A) 07/03/2018 2009   APPEARANCEUR HAZY (A) 07/03/2018 2009   LABSPEC 1.021 07/03/2018 2009   PHURINE 5.0 07/03/2018 2009   GLUCOSEU NEGATIVE 07/03/2018 2009   HGBUR NEGATIVE 07/03/2018 2009   BILIRUBINUR NEGATIVE 07/03/2018 2009   KETONESUR 5 (A) 07/03/2018 2009   PROTEINUR 30 (A) 07/03/2018 2009   UROBILINOGEN 0.2 05/27/2014 1350   NITRITE NEGATIVE 07/03/2018 2009   LEUKOCYTESUR SMALL (A) 07/03/2018 2009   Sepsis Labs: @LABRCNTIP (procalcitonin:4,lacticidven:4)  )No results found for this or any previous visit (from the past 240 hour(s)).    Studies: Dg Chest 2 View  Result Date: 07/03/2018 CLINICAL DATA:   Tachycardia EXAM: CHEST - 2 VIEW COMPARISON:  07/22/2009 radiograph FINDINGS: UPPER limits normal heart size noted. Mild pulmonary vascular congestion identified with possible mild interstitial opacities. No pneumothorax or definite effusions. No airspace disease or pulmonary mass noted. No acute bony abnormalities are present. LEFT shoulder arthroplasty changes identified. IMPRESSION: Mild pulmonary vascular congestion with possible mild interstitial opacities which may represent very mild interstitial edema. Electronically Signed   By: Margarette Canada M.D.   On: 07/03/2018 21:26    Scheduled Meds: . aspirin EC  81 mg Oral Daily  . atorvastatin  5 mg Oral q1800  . brimonidine  1 drop Both Eyes TID  . brinzolamide  1 drop Both Eyes TID  . latanoprost  1 drop Both Eyes QHS  . metoprolol tartrate  12.5 mg Oral BID  . multivitamin  1 tablet Oral Daily  . sodium chloride flush  3 mL  Intravenous Q12H  . sodium chloride flush  3 mL Intravenous Q12H  . timolol  1 drop Both Eyes BID    Continuous Infusions: . sodium chloride    . heparin 1,100 Units/hr (07/04/18 1610)     LOS: 0 days     Kayleen Memos, MD [facility] Pager [phone]  If 7PM-7AM, please contact night-coverage [email or web address] Password [email or web address] 07/04/2018, 6:20 PM

## 2018-07-04 NOTE — Progress Notes (Signed)
Patient to unit from ED, daughter at bedside. CHG bath given and CCMD notified after telemetry applied. Oriented to room and call bell which is tied to bed rail as patient is legally blind and can see only light and shapes. She agrees to call for assist up to bathroom or any other needs. Heparin gtt at 11 and vitals stable.

## 2018-07-04 NOTE — Progress Notes (Signed)
  Echocardiogram 2D Echocardiogram has been performed.  Bobbye Charleston 07/04/2018, 11:38 AM

## 2018-07-04 NOTE — ED Notes (Signed)
Patient placed on hospital bed for comfort °

## 2018-07-05 DIAGNOSIS — I471 Supraventricular tachycardia: Secondary | ICD-10-CM | POA: Diagnosis not present

## 2018-07-05 LAB — MAGNESIUM: Magnesium: 1.9 mg/dL (ref 1.7–2.4)

## 2018-07-05 LAB — CBC
HCT: 25.8 % — ABNORMAL LOW (ref 36.0–46.0)
Hemoglobin: 8 g/dL — ABNORMAL LOW (ref 12.0–15.0)
MCH: 28.6 pg (ref 26.0–34.0)
MCHC: 31 g/dL (ref 30.0–36.0)
MCV: 92.1 fL (ref 80.0–100.0)
Platelets: 201 10*3/uL (ref 150–400)
RBC: 2.8 MIL/uL — ABNORMAL LOW (ref 3.87–5.11)
RDW: 14.5 % (ref 11.5–15.5)
WBC: 6.3 10*3/uL (ref 4.0–10.5)
nRBC: 0 % (ref 0.0–0.2)

## 2018-07-05 LAB — HEPATITIS PANEL, ACUTE
HCV Ab: <0.1 s/co ratio (ref 0.0–0.9)
HEP B C IGM: NEGATIVE
Hep A IgM: NEGATIVE
Hepatitis B Surface Ag: NEGATIVE

## 2018-07-05 LAB — BASIC METABOLIC PANEL
Anion gap: 9 (ref 5–15)
BUN: 16 mg/dL (ref 8–23)
CO2: 16 mmol/L — ABNORMAL LOW (ref 22–32)
Calcium: 7.6 mg/dL — ABNORMAL LOW (ref 8.9–10.3)
Chloride: 110 mmol/L (ref 98–111)
Creatinine, Ser: 0.84 mg/dL (ref 0.44–1.00)
GFR calc Af Amer: >60 mL/min (ref >60)
GFR calc non Af Amer: >60 mL/min (ref >60)
Glucose, Bld: 113 mg/dL — ABNORMAL HIGH (ref 70–99)
Potassium: 3.7 mmol/L (ref 3.5–5.1)
Sodium: 135 mmol/L (ref 135–145)

## 2018-07-05 LAB — HEPARIN LEVEL (UNFRACTIONATED): Heparin Unfractionated: 0.29 IU/mL — ABNORMAL LOW (ref 0.30–0.70)

## 2018-07-05 MED ORDER — METOPROLOL TARTRATE 25 MG PO TABS: 12.5000 mg | ORAL_TABLET | Freq: Two times a day (BID) | ORAL | 0 refills | Status: DC

## 2018-07-05 MED ORDER — MUSCLE RUB 10-15 % EX CREA: 1.0000 "application " | TOPICAL_CREAM | Freq: Two times a day (BID) | CUTANEOUS | 0 refills | Status: DC | PRN

## 2018-07-05 NOTE — Progress Notes (Signed)
ANTICOAGULATION CONSULT NOTE - Follow Up Consult  Pharmacy Consult for heparin Indication: PAF/ACS  Labs: Recent Labs    07/03/18 1833  07/04/18 0228 07/04/18 0905 07/04/18 1358 07/05/18 0003  HGB 10.0*  --  9.0*  --   --   --   HCT 31.5*  --  28.1*  --   --   --   PLT 228  --  193  --   --   --   HEPARINUNFRC  --   --  <0.10*  --  0.10* 0.29*  CREATININE 1.22*  --  1.21*  --   --   --   TROPONINI  --    < > 0.55* 0.42* 0.31*  --    < > = values in this interval not displayed.    Assessment: 82yo female remains subtherapeutic on heparin after rate changes though now close to goal; no gtt issues or signs of bleeding per RN.  Goal of Therapy:  Heparin level 0.3-0.7 units/ml   Plan:  Will rincrease heparin gtt slightly to 1200 units/hr and check level in 8 hours.    Wynona Neat, PharmD, BCPS  07/05/2018,1:00 AM

## 2018-07-05 NOTE — Discharge Instructions (Signed)
Aspirin and Your Heart Aspirin is a medicine that affects the way blood clots. Aspirin can be used to help reduce the risk of blood clots, heart attacks, and other heart-related problems. Should I take aspirin? Your health care provider will help you determine whether it is safe and beneficial for you to take aspirin daily. Taking aspirin daily may be beneficial if you:  Have had a heart attack or chest pain.  Have undergone open heart surgery such as coronary artery bypass surgery (CABG).  Have had coronary angioplasty.  Have experienced a stroke or transient ischemic attack (TIA).  Have peripheral vascular disease (PVD).  Have chronic heart rhythm problems such as atrial fibrillation.  Are there any risks of taking aspirin daily? Daily use of aspirin can increase your risk of side effects. Some of these include:  Bleeding. Bleeding problems can be minor or serious. An example of a minor problem is a cut that does not stop bleeding. An example of a more serious problem is stomach bleeding or bleeding into the brain. Your risk of bleeding is increased if you are also taking non-steroidal anti-inflammatory medicine (NSAIDs).  Increased bruising.  Upset stomach.  An allergic reaction. People who have nasal polyps have an increased risk of developing an aspirin allergy.  What are some guidelines I should follow when taking aspirin?  Take aspirin only as directed by your health care provider. Make sure you understand how much you should take and what form you should take. The two forms of aspirin are: ? Non-enteric-coated. This type of aspirin does not have a coating and is absorbed quickly. Non-enteric-coated aspirin is usually recommended for people with chest pain. This type of aspirin also comes in a chewable form. ? Enteric-coated. This type of aspirin has a special coating that releases the medicine very slowly. Enteric-coated aspirin causes less stomach upset than  non-enteric-coated aspirin. This type of aspirin should not be chewed or crushed.  Drink alcohol in moderation. Drinking alcohol increases your risk of bleeding. When should I seek medical care?  You have unusual bleeding or bruising.  You have stomach pain.  You have an allergic reaction. Symptoms of an allergic reaction include: ? Hives. ? Itchy skin. ? Swelling of the lips, tongue, or face.  You have ringing in your ears. When should I seek immediate medical care?  Your bowel movements are bloody, dark red, or black in color.  You vomit or cough up blood.  You have blood in your urine.  You cough, wheeze, or feel short of breath. If you have any of the following symptoms, this is an emergency. Do not wait to see if the pain will go away. Get medical help at once. Call your local emergency services (911 in the [place] Do not drive yourself to the hospital.  You have severe chest pain, especially if the pain is crushing or pressure-like and spreads to the arms, back, neck, or jaw.  You have stroke-like symptoms, such as: ? Loss of vision. ? Difficulty talking. ? Numbness or weakness on one side of your body. ? Numbness or weakness in your arm or leg. ? Not thinking clearly or feeling confused.  This information is not intended to replace advice given to you by your health care provider. Make sure you discuss any questions you have with your health care provider. Document Released: 06/30/2008 Document Revised: 11/25/2015 Document Reviewed: 10/23/2013 Elsevier Interactive Patient Education  2018 [employer].   Holter Monitoring A Holter monitor is a small  device that is used to detect abnormal heart rhythms. It clips to your clothing and is connected by wires to flat, sticky disks (electrodes) that attach to your chest. It is worn continuously for 24-48 hours. Follow these instructions at home:  Wear your Holter monitor at all times, even while exercising and sleeping, for  as long as directed by your health care provider.  Make sure that the Holter monitor is safely clipped to your clothing or close to your body as recommended by your health care provider.  Do not get the monitor or wires wet.  Do not put body lotion or moisturizer on your chest.  Keep your skin clean.  Keep a diary of your daily activities, such as walking and doing chores. If you feel that your heartbeat is abnormal or that your heart is fluttering or skipping a beat: ? Record what you are doing when it happens. ? Record what time of day the symptoms occur.  Return your Holter monitor as directed by your health care provider.  Keep all follow-up visits as directed by your health care provider. This is important. Get help right away if:  You feel lightheaded or you faint.  You have trouble breathing.  You feel pain in your chest, upper arm, or jaw.  You feel sick to your stomach and your skin is pale, cool, or damp.  You heartbeat feels unusual or abnormal. This information is not intended to replace advice given to you by your health care provider. Make sure you discuss any questions you have with your health care provider. Document Released: 04/15/2004 Document Revised: 12/24/2015 Document Reviewed: 02/24/2014 Elsevier Interactive Patient Education  [employer].

## 2018-07-05 NOTE — Progress Notes (Signed)
Patient discharged per MD orders. PIV and telemetry removed. Patient discharged home with all personal belongings. Daughter at the bedside

## 2018-07-05 NOTE — Care Management Note (Signed)
Case Management Note Marvetta Gibbons RN, BSN Transitions of Care Unit 4E- RN Case Manager [phone]  Patient Details  Name: Meredith Moore MRN: [id number] Date of Birth: 09/11/1935  Subjective/Objective:  Pt presented with elevated HR                  Action/Plan: PTA pt lived at home, pt legally blind, orders placed for HHRN/PT/OT/aide- CM spoke with pt at bedside for transition of care needs- per pt. She does not use any assist devices inside the home, only when she is out and about- she politely declined [place] list and also any HH services at this time- pt states that she feels that she is close to baseline and plans to take it easy and "keep doing what she has been doing as far as activity" - no further CM needs identified. Pt to transition home with family.   Expected Discharge Date:  07/05/18               Expected Discharge Plan:  [place]  In-House Referral:     Discharge planning Services  CM Consult  Post Acute Care Choice:  Home Health Choice offered to:  Patient  DME Arranged:    DME Agency:     HH Arranged:  RN, PT, OT, Nurse's Aide, Patient Refused [place] Agency:     Status of Service:  Completed, signed off  If discussed at [place] of Stay Meetings, dates discussed:    Discharge Disposition: home/self care   Additional Comments:  Dawayne Patricia, RN 07/05/2018, 12:23 PM

## 2018-07-05 NOTE — Discharge Summary (Signed)
Discharge Summary  Meredith Moore TMH:[id number] DOB: Jan 30, 1936  PCP: Dineen Kid, MD  Admit date: 07/03/2018 Discharge date: 07/05/2018  Time spent: 35 minutes  Recommendations for Outpatient Follow-up:  1. Follow-up with cardiology 2. Follow-up with your PCP 3. Take your medications as prescribed  Discharge Diagnoses:  Active Hospital Problems   Diagnosis Date Noted  . Paroxysmal atrial tachycardia ([place]) 07/03/2018  . Hypokalemia 07/03/2018  . Normocytic anemia 07/03/2018  . Mild renal insufficiency 07/03/2018  . Elevated troponin 07/03/2018  . Viral gastroenteritis 07/03/2018    Resolved Hospital Problems  No resolved problems to display.    Discharge Condition: Stable  Diet recommendation: Heart healthy diet  Vitals:   07/05/18 0846 07/05/18 1110  BP: 119/79 (!) 106/57  Pulse: 84 70  Resp: 13 (!) 21  Temp: 98.9 F (37.2 C)   SpO2: 97% 98%    History of present illness:  Meredith Moore a 82 y.o.femalewith medical history significant forblindness, chronic normocytic anemia, chronic shoulder pain, and hyperlipidemia, who presented to the emergency department for evaluation of tachycardia. Patient reports several days of chills, nausea, general malaise, and loose stools. She was seen briefly at an urgent care for evaluation of the symptoms, noted to have heart rate in the 150s with EKG concerning for possible atrial fibrillation.  She was sent to the ED for further evaluation. Admitted for paroxysmal atrial tachycardia and elevated troponin secondary to demand ischemia.  Cardiology was consulted and followed.  Troponins trending down on heparin drip which was stopped at the request of cardiology.  Patient was started on a new medication for her heart rate.  Metoprolol tartrate 12.5 mg twice daily.  She tolerated it well.  07/05/18: Patient seen and examined at her bedside.  No acute events overnight.  She has no new complaints.  She denies chest pain,  dyspnea, or palpitations.  Denies any abdominal pain, cramping or diarrhea.  Stated her last stool was formed more than 1 day ago.  On the day of discharge, the patient was hemodynamically stable.  She will need to follow-up with cardiology and her PCP posthospitalization.  Cardiology signed off and will follow-up outpatient within 7 to 10 days for possible stress test versus coronary CTA.  Hospital Course:  Principal Problem:   Paroxysmal atrial tachycardia (HCC) Active Problems:   Hypokalemia   Normocytic anemia   Mild renal insufficiency   Elevated troponin   Viral gastroenteritis   P. Atrial tachycardia Started on metoprolol tartrate 12.5 mg twice daily 2D echo done on 07/04/2018 revealed normal LVEF, grade 2 diastolic dysfunction with elevated PA systolic pressure and mild to moderate tricuspid regurgitation Heart rate is back to sinus rhythm Follow-up with cardiology outpatient  Elevated troponin 2/2 demand ischemia peaked at 0.61  Trended down on hep drip Follow-up with cardiology within 7 to 10 days for possible stress test versus coronary CTA Denies chest pain  Moderate pulmonary HTN Follow-up with cardiology outpatient  Chronic dCHF with grade 2 diastolic dysfunction Management per cardiology  Generalized weakness, persistent Continue physical therapy  AKI, resolved Baseline creatinine appears to be 0.8 with GFR greater than 60 Presented with creatinine of 1.21 and GFR of 43 Her creatinine/GFR are back to normal Avoid dehydration Follow-up with your PCP  HLD Continue Lipitor  Blindness Fall precautions Follow-up with ophthalmology as needed  Acute viral gastroenteritis, resolving  Chronic normocytic anemia No sign of overt bleeding Follow-up with your PCP  Code Status:Full  Consults called:Cardiology   Discharge Exam: BP [name])  106/57 (BP Location: Left Arm)   Pulse 70   Temp 98.9 F (37.2 C) (Oral)   Resp (!) 21   Ht 5\' 4"  (1.626 m)    Wt 59 kg   SpO2 98%   BMI 22.31 kg/m  . General: 82 y.o. year-old female well developed well nourished in no acute distress.  Alert and oriented x3. . Cardiovascular: Regular rate and rhythm with no rubs or gallops.  No thyromegaly or JVD noted.   [name] Respiratory: Clear to auscultation with no wheezes or rales. Good inspiratory effort. . Abdomen: Soft nontender nondistended with normal bowel sounds x4 quadrants. . Musculoskeletal: No lower extremity edema. 2/4 pulses in all 4 extremities. . Skin: No ulcerative lesions noted or rashes, . Psychiatry: Mood is appropriate for condition and setting  Discharge Instructions You were cared for by a hospitalist during your hospital stay. If you have any questions about your discharge medications or the care you received while you were in the hospital after you are discharged, you can call the unit and asked to speak with the hospitalist on call if the hospitalist that took care of you is not available. Once you are discharged, your primary care physician will handle any further medical issues. Please note that NO REFILLS for any discharge medications will be authorized once you are discharged, as it is imperative that you return to your primary care physician (or establish a relationship with a primary care physician if you do not have one) for your aftercare needs so that they can reassess your need for medications and monitor your lab values.   Allergies as of 07/05/2018      Reactions   Temazepam    Tramadol    Fine when taking it- did not sleep for 6 weeks after stopping medication   Formaldehyde Rash   Nickel Rash      Medication List    STOP taking these medications   trolamine salicylate 10 % cream Commonly known as:  ASPERCREME Replaced by:  MUSCLE RUB 10-15 % Crea     TAKE these medications   aspirin EC 81 MG tablet Take 81 mg daily by mouth.   atorvastatin 10 MG tablet Commonly known as:  LIPITOR Take 1/2 (one-half) tablet by  mouth at bedtime for cholesterol What changed:    how much to take  how to take this  when to take this   CALCIUM+D3 PO Take 1 tablet by mouth daily.   latanoprost 0.005 % ophthalmic solution Commonly known as:  XALATAN Place 1 drop into both eyes at bedtime. 2300   meclizine 12.5 MG tablet Commonly known as:  ANTIVERT Take one tablet by mouth up to four times daily as needed for vertigo What changed:    how much to take  how to take this  when to take this  reasons to take this  additional instructions   metoprolol tartrate 25 MG tablet Commonly known as:  LOPRESSOR Take 0.5 tablets (12.5 mg total) by mouth 2 (two) times daily.   MUSCLE RUB 10-15 % Crea Apply 1 application topically 2 (two) times daily as needed (for shoulder pain.). Replaces:  trolamine salicylate 10 % cream   PRESERVISION AREDS 2+MULTI VIT Caps Take 1 tablet by mouth 2 (two) times daily.   QUEtiapine 25 MG tablet Commonly known as:  SEROQUEL 1/2-1 tablet at night as needed for rest What changed:    how much to take  how to take this  when to take this  additional instructions   SIMBRINZA 1-0.2 % Susp Generic drug:  Brinzolamide-Brimonidine Place 1 drop into both eyes 3 (three) times daily with meals.   timolol 0.5 % ophthalmic solution Commonly known as:  TIMOPTIC Place 1 drop into both eyes 2 (two) times daily. Breakfast & dinner.   VITAMIN D3 PO Take 1 tablet by mouth daily.      Allergies  Allergen Reactions  . Temazepam   . Tramadol     Fine when taking it- did not sleep for 6 weeks after stopping medication  . Formaldehyde Rash  . Nickel Rash   Follow-up Information    Via, Lennette Bihari, MD. Call in 1 day(s).   Specialty:  Family Medicine Why:  Please call for a post hospital follow-up appointment. Contact information: [place] [address] [phone]        Jerline Pain, MD. Call in 1 day(s).   Specialty:  Cardiology Why:  Please call for  an appointment.  You will need to follow-up with cardiology within 7 to 10 days for possible stress test versus coronary CTA. Contact information: [address] [place]  [address] [phone]            The results of significant diagnostics from this hospitalization (including imaging, microbiology, ancillary and laboratory) are listed below for reference.    Significant Diagnostic Studies: Dg Chest 2 View  Result Date: 07/03/2018 CLINICAL DATA:  Tachycardia EXAM: CHEST - 2 VIEW COMPARISON:  07/22/2009 radiograph FINDINGS: UPPER limits normal heart size noted. Mild pulmonary vascular congestion identified with possible mild interstitial opacities. No pneumothorax or definite effusions. No airspace disease or pulmonary mass noted. No acute bony abnormalities are present. LEFT shoulder arthroplasty changes identified. IMPRESSION: Mild pulmonary vascular congestion with possible mild interstitial opacities which may represent very mild interstitial edema. Electronically Signed   By: Margarette Canada M.D.   On: 07/03/2018 21:26    Microbiology: No results found for this or any previous visit (from the past 240 hour(s)).   Labs: Basic Metabolic Panel: Recent Labs  Lab 07/03/18 1833 07/03/18 2149 07/04/18 0228 07/05/18 0316  NA 135  --  135 135  K 3.2*  --  4.0 3.7  CL 103  --  107 110  CO2 20*  --  19* 16*  GLUCOSE 149*  --  123* 113*  BUN 26*  --  22 16  CREATININE 1.22*  --  1.21* 0.84  CALCIUM 8.6*  --  7.9* 7.6*  MG  --  1.6*  --  1.9   Liver Function Tests: Recent Labs  Lab 07/03/18 2031 07/04/18 0228  AST 46* 42*  ALT 48* 48*  ALKPHOS 65 71  BILITOT 0.7 0.7  PROT 5.4* 5.3*  ALBUMIN 3.0* 2.9*   Recent Labs  Lab 07/03/18 2031  LIPASE 46   No results for input(s): AMMONIA in the last 168 hours. CBC: Recent Labs  Lab 07/03/18 1833 07/04/18 0228 07/05/18 0316  WBC 9.6 7.3 6.3  HGB 10.0* 9.0* 8.0*  HCT 31.5* 28.1* 25.8*  MCV 93.2 93.4  92.1  PLT 228 193 201   Cardiac Enzymes: Recent Labs  Lab 07/03/18 2149 07/04/18 0228 07/04/18 0905 07/04/18 1358  TROPONINI 0.61* 0.55* 0.42* 0.31*   BNP: BNP (last 3 results) No results for input(s): BNP in the last 8760 hours.  ProBNP (last 3 results) No results for input(s): PROBNP in the last 8760 hours.  CBG: No results for input(s): GLUCAP in the last  168 hours.     Signed:  Kayleen Memos, MD [facility] 07/05/2018, 11:17 AM

## 2018-07-05 NOTE — Plan of Care (Signed)

## 2018-07-05 NOTE — Evaluation (Signed)
Physical Therapy Evaluation Patient Details Name: Meredith Moore MRN: [id number] DOB: 04/07/1936 Today's Date: 07/05/2018   History of Present Illness  Pt is a 82 y.o. F with significant PMH of blindness, chronic normocytic anemia, chronic shoulder pain, who presents for evaluation of tachycardia.  Clinical Impression  Patient evaluated by Physical Therapy with no further acute PT needs identified. Pt ambulating 350 feet with cane without physical assistance. DOE 1/4 and HR up to 91 bpm. Pt with excellent compensatory techniques to accommodate for visual deficits. PT provided environmental cues. All education has been completed and the patient has no further questions. No follow-up Physical Therapy or equipment needs. PT is signing off. Thank you for this referral.     Follow Up Recommendations No PT follow up    Equipment Recommendations  None recommended by PT    Recommendations for Other Services       Precautions / Restrictions Precautions Precautions: Fall Precaution Comments: pt is blind Restrictions Weight Bearing Restrictions: No      Mobility  Bed Mobility Overal bed mobility: Independent                Transfers Overall transfer level: Independent Equipment used: None                Ambulation/Gait Ambulation/Gait assistance: Modified independent (Device/Increase time) Gait Distance (Feet): 350 Feet Assistive device: Straight cane Gait Pattern/deviations: Step-through pattern;Decreased stride length     General Gait Details: Pt using cane intermittently for guidance and PT provided environmental cues. no evidence of gross unsteadiness. DOE 1/4  Stairs            Wheelchair Mobility    Modified Rankin (Stroke Patients Only)       Balance Overall balance assessment: Mild deficits observed, not formally tested                                           Pertinent Vitals/Pain Pain Assessment: No/denies pain     Home Living Family/patient expects to be discharged to:: Private residence Living Arrangements: Spouse/significant other Available Help at Discharge: Family Type of Home: House Home Access: Stairs to enter Entrance Stairs-Rails: Psychiatric nurse of Steps: [place]: Two level;Bed/bath upstairs Home Equipment: Walker - 2 wheels;Cane - single point;Toilet riser      Prior Function Level of Independence: Independent         Comments: does use cane for community ambulation. denies history of falls     Hand Dominance   Dominant Hand: Right    Extremity/Trunk Assessment   Upper Extremity Assessment Upper Extremity Assessment: Overall WFL for tasks assessed    Lower Extremity Assessment Lower Extremity Assessment: Overall WFL for tasks assessed       Communication   Communication: No difficulties  Cognition Arousal/Alertness: Awake/alert Behavior During Therapy: WFL for tasks assessed/performed Overall Cognitive Status: Within Functional Limits for tasks assessed                                        General Comments      Exercises     Assessment/Plan    PT Assessment Patent does not need any further PT services  PT Problem List         PT Treatment Interventions  PT Goals (Current goals can be found in the Care Plan section)  Acute Rehab PT Goals Patient Stated Goal: "go home." PT Goal Formulation: All assessment and education complete, DC therapy    Frequency     Barriers to discharge        Co-evaluation               AM-PAC PT "6 Clicks" Mobility  Outcome Measure Help needed turning from your back to your side while in a flat bed without using bedrails?: None Help needed moving from lying on your back to sitting on the side of a flat bed without using bedrails?: None Help needed moving to and from a bed to a chair (including a wheelchair)?: None Help needed standing up from a chair using  your arms (e.g., wheelchair or bedside chair)?: None Help needed to walk in hospital room?: None Help needed climbing 3-5 steps with a railing? : A Little 6 Click Score: 23    End of Session   Activity Tolerance: Patient tolerated treatment well Patient left: in bed;with call bell/phone within reach;with family/visitor present   PT Visit Diagnosis: Difficulty in walking, not elsewhere classified (R26.2);Unsteadiness on feet (R26.81)    Time: 6440-3474 PT Time Calculation (min) (ACUTE ONLY): 11 min   Charges:   PT Evaluation $PT Eval Moderate Complexity: 1 Mod          Ellamae Sia, [place], DPT Acute Rehabilitation Services Pager [phone] Office [phone]   Willy Eddy 07/05/2018, 12:24 PM

## 2018-07-26 ENCOUNTER — Encounter: Payer: Self-pay | Admitting: Cardiology

## 2018-08-02 ENCOUNTER — Encounter (HOSPITAL_COMMUNITY): Payer: Self-pay | Admitting: Emergency Medicine

## 2018-08-02 ENCOUNTER — Emergency Department (HOSPITAL_COMMUNITY)
Admission: EM | Admit: 2018-08-02 | Discharge: 2018-08-03 | Disposition: A | Discharge status: Home / Self Care | Payer: Medicare Other | Attending: Emergency Medicine | Admitting: Emergency Medicine

## 2018-08-02 DIAGNOSIS — E860 Dehydration: Secondary | ICD-10-CM | POA: Diagnosis not present

## 2018-08-02 DIAGNOSIS — Z79899 Other long term (current) drug therapy: Secondary | ICD-10-CM | POA: Insufficient documentation

## 2018-08-02 DIAGNOSIS — Z87891 Personal history of nicotine dependence: Secondary | ICD-10-CM | POA: Diagnosis not present

## 2018-08-02 DIAGNOSIS — Z7982 Long term (current) use of aspirin: Secondary | ICD-10-CM | POA: Insufficient documentation

## 2018-08-02 DIAGNOSIS — N179 Acute kidney failure, unspecified: Secondary | ICD-10-CM | POA: Diagnosis not present

## 2018-08-02 DIAGNOSIS — R197 Diarrhea, unspecified: Secondary | ICD-10-CM | POA: Diagnosis not present

## 2018-08-02 DIAGNOSIS — R109 Unspecified abdominal pain: Secondary | ICD-10-CM | POA: Diagnosis present

## 2018-08-02 LAB — COMPREHENSIVE METABOLIC PANEL
ALT: 29 U/L (ref 0–44)
AST: 33 U/L (ref 15–41)
Albumin: 3.3 g/dL — ABNORMAL LOW (ref 3.5–5.0)
Alkaline Phosphatase: 102 U/L (ref 38–126)
Anion gap: 11 (ref 5–15)
BUN: 29 mg/dL — ABNORMAL HIGH (ref 8–23)
CO2: 24 mmol/L (ref 22–32)
Calcium: 11 mg/dL — ABNORMAL HIGH (ref 8.9–10.3)
Chloride: 97 mmol/L — ABNORMAL LOW (ref 98–111)
Creatinine, Ser: 1.72 mg/dL — ABNORMAL HIGH (ref 0.44–1.00)
GFR calc Af Amer: 32 mL/min — ABNORMAL LOW (ref >60)
GFR, EST NON AFRICAN AMERICAN: 28 mL/min — AB (ref >60)
Glucose, Bld: 119 mg/dL — ABNORMAL HIGH (ref 70–99)
Potassium: 3.9 mmol/L (ref 3.5–5.1)
Sodium: 132 mmol/L — ABNORMAL LOW (ref 135–145)
Total Bilirubin: 0.5 mg/dL (ref 0.3–1.2)
Total Protein: 6.4 g/dL — ABNORMAL LOW (ref 6.5–8.1)

## 2018-08-02 LAB — CBC
HCT: 33.2 % — ABNORMAL LOW (ref 36.0–46.0)
HEMOGLOBIN: 10.3 g/dL — AB (ref 12.0–15.0)
MCH: 28.8 pg (ref 26.0–34.0)
MCHC: 31 g/dL (ref 30.0–36.0)
MCV: 92.7 fL (ref 80.0–100.0)
Platelets: 276 10*3/uL (ref 150–400)
RBC: 3.58 MIL/uL — ABNORMAL LOW (ref 3.87–5.11)
RDW: 14.1 % (ref 11.5–15.5)
WBC: 4.6 10*3/uL (ref 4.0–10.5)
nRBC: 0 % (ref 0.0–0.2)

## 2018-08-02 LAB — LIPASE, BLOOD: Lipase: 27 U/L (ref 11–51)

## 2018-08-02 NOTE — ED Notes (Signed)
Pt had an episode of emesis and diarrhea on bathor[place] floor in waiting area.

## 2018-08-02 NOTE — ED Triage Notes (Signed)
Onset one month ago developed diarrhea and intermittent nausea. Was admitted to the hospital for tachycardia per patient but never addressed the diarrhea. Seen at [facility] today and sent to the ED for evaluation of diarrhea and low blood pressure.

## 2018-08-03 LAB — GASTROINTESTINAL PANEL BY PCR, STOOL (REPLACES STOOL CULTURE)

## 2018-08-03 LAB — C DIFFICILE QUICK SCREEN W PCR REFLEX
C DIFFICILE (CDIFF) TOXIN: NEGATIVE
C Diff antigen: POSITIVE — AB

## 2018-08-03 LAB — CLOSTRIDIUM DIFFICILE BY PCR, REFLEXED: Toxigenic C. Difficile by PCR: POSITIVE — AB

## 2018-08-03 MED ORDER — SODIUM CHLORIDE 0.9 % IV BOLUS
1000.0000 mL | Freq: Once | INTRAVENOUS | Status: AC
  Administered 2018-08-03: 1000 mL via INTRAVENOUS

## 2018-08-03 NOTE — ED Provider Notes (Signed)
[facility] EMERGENCY DEPARTMENT Provider Note  CSN: [id number] Arrival date & time: 08/02/18 1605  Chief Complaint(s) Abdominal Pain; Diarrhea; Nausea; and Hypotension  HPI Meredith Moore is a 83 y.o. female with a past medical history listed below who presents to the emergency department for evaluation of hypotension noted at primary care's office.  Patient was being evaluated for 1 month of persistent and profuse watery diarrhea.  Patient was evaluated several weeks ago and had negative GI panel and C. difficile.  She was given Cipro without relief.  She reports that the only thing that provides relief was the bland diet which she was on for several days however came off of it.  She has tried Imodium with mild relief.  Denies any fevers or chills.  She does endorse intermittent abdominal discomfort just prior to the diarrhea.  She reports one episode of emesis here in the waiting room.  Denies any suspicious food intake, recent travel.  No stream water or well water consumption.  No known sick contacts.  HPI  Past Medical History Past Medical History:  Diagnosis Date  . Anemia   . Arthritis   . Blind 10/12/2016   Has learned Braille  . Complication of anesthesia    "Blood pressure has gone too low before and effected vision" just with general  . Complication of anesthesia    "woke up in severe panic attack" just with general  . Glaucoma   . Headache    hx of   . Herpes zoster with unspecified complication   . Hip dysplasia    right side  . History of transfusion 2014   2 units, no reaction-after knee replacement  . Hyperlipidemia    under control  . Macular degeneration   . Osteoarthrosis, unspecified whether generalized or localized, unspecified site   . Right hip pain    hx    Patient Active Problem List   Diagnosis Date Noted  . Paroxysmal atrial tachycardia ([place]) 07/03/2018  . Hypokalemia 07/03/2018  . Normocytic anemia 07/03/2018  . Mild renal  insufficiency 07/03/2018  . Elevated troponin 07/03/2018  . Viral gastroenteritis 07/03/2018  . S/p reverse total shoulder arthroplasty 12/21/2017  . Blind 10/12/2016  . Medication management 10/12/2016  . Insomnia 10/07/2015  . Glaucoma 10/07/2015  . OA (osteoarthritis) of hip 06/04/2014  . Macular degeneration 09/18/2013  . Osteopenia 09/18/2013  . Hyperlipidemia 03/14/2013  . OA (osteoarthritis) of knee 09/10/2012   Home Medication(s) Prior to Admission medications   Medication Sig Start Date End Date Taking? Authorizing Provider  aspirin EC 81 MG tablet Take 81 mg daily by mouth.    [provider]  atorvastatin (LIPITOR) 10 MG tablet Take 1/2 (one-half) tablet by mouth at bedtime for cholesterol Patient taking differently: Take 5 mg by mouth at bedtime. Take 1/2 (one-half) tablet by mouth at bedtime for cholesterol 06/13/17   Lauree Chandler, NP  Calcium Carb-Cholecalciferol (CALCIUM+D3 PO) Take 1 tablet by mouth daily.    [provider]  Cholecalciferol (VITAMIN D3 PO) Take 1 tablet by mouth daily.    [provider]  latanoprost (XALATAN) 0.005 % ophthalmic solution Place 1 drop into both eyes at bedtime. 2300    [provider]  meclizine (ANTIVERT) 12.5 MG tablet Take one tablet by mouth up to four times daily as needed for vertigo Patient taking differently: Take 12.5 mg by mouth 4 (four) times daily as needed (for vertigo).  11/23/16   Estill Dooms, MD  Menthol-Methyl Salicylate (MUSCLE RUB) 10-15 % CREA Apply 1 application topically 2 (two) times daily as needed (for shoulder pain.). 07/05/18   Kayleen Memos, DO  metoprolol tartrate (LOPRESSOR) 25 MG tablet Take 0.5 tablets (12.5 mg total) by mouth 2 (two) times daily. 07/05/18   Kayleen Memos, DO  Multiple Vitamins-Minerals (PRESERVISION AREDS 2+MULTI VIT) CAPS Take 1 tablet by mouth 2 (two) times daily.    [provider]  QUEtiapine (SEROQUEL) 25 MG tablet 1/2-1 tablet at  night as needed for rest Patient taking differently: Take 12.5 mg by mouth at bedtime.  06/14/17   Lauree Chandler, NP  SIMBRINZA 1-0.2 % SUSP Place 1 drop into both eyes 3 (three) times daily with meals.  03/30/17   [provider]  timolol (TIMOPTIC) 0.5 % ophthalmic solution Place 1 drop into both eyes 2 (two) times daily. Breakfast & dinner.    [provider]                                                                                                                                    Past Surgical History Past Surgical History:  Procedure Laterality Date  . APPENDECTOMY  1979  . BELPHAROPTOSIS REPAIR     eye lid drooping repair  . Left hip arthroplasty     05/10/17 Dr. Wynelle Link  . ovarian tumor   1979   benign tumor removed   . REVERSE SHOULDER ARTHROPLASTY Left 12/21/2017   Procedure: LEFT REVERSE SHOULDER ARTHROPLASTY;  Surgeon: Justice Britain, MD;  Location: [facility];  Service: Orthopedics;  Laterality: Left;  . ROBOTIC ASSISTED TOTAL HYSTERECTOMY WITH BILATERAL SALPINGO OOPHERECTOMY Bilateral 03/25/2014   Procedure: ROBOTIC ASSISTED TOTAL HYSTERECTOMY WITH BILATERAL SALPINGO OOPHORECTOMY;  Surgeon: Everitt Amber, MD;  Location: [facility];  Service: Gynecology;  Laterality: Bilateral;  . TONSILLECTOMY     1945   . TOTAL HIP ARTHROPLASTY Right 06/04/2014   Procedure: RIGHT TOTAL HIP ARTHROPLASTY ANTERIOR APPROACH;  Surgeon: Gearlean Alf, MD;  Location: [facility];  Service: Orthopedics;  Laterality: Right;  . TOTAL HIP ARTHROPLASTY Left 05/10/2017   Procedure: LEFT TOTAL HIP ARTHROPLASTY ANTERIOR APPROACH;  Surgeon: Gaynelle Arabian, MD;  Location: [facility];  Service: Orthopedics;  Laterality: Left;  90 mins  . TOTAL KNEE ARTHROPLASTY Right 09/10/2012   Procedure: RIGHT TOTAL KNEE ARTHROPLASTY;  Surgeon: Gearlean Alf, MD;  Location: [facility];  Service: Orthopedics;  Laterality: Right;   Family History Family History  Problem Relation Age of Onset  . Heart disease Mother     . Heart disease Father     Social History Social History   Tobacco Use  . Smoking status: Former Smoker    Packs/day: 0.25    Years: 15.00    Pack years: 3.75    Last attempt to quit: 08/01/1988    Years since quitting: 30.0  . Smokeless tobacco: Never Used  Substance Use Topics  .  Alcohol use: No  . Drug use: No   Allergies Temazepam; Tramadol; Formaldehyde; and Nickel  Review of Systems Review of Systems All other systems are reviewed and are negative for acute change except as noted in the HPI  Physical Exam Vital Signs  I have reviewed the triage vital signs BP 105/63   Pulse 75   Temp 98.3 F (36.8 C) (Oral)   Resp 18   Ht 5\' 4"  (1.626 m)   Wt 58.1 kg   SpO2 98%   BMI 21.97 kg/m   Physical Exam Vitals signs reviewed.  Constitutional:      General: She is not in acute distress.    Appearance: She is well-developed. She is not diaphoretic.  HENT:     Head: Normocephalic and atraumatic.     Right Ear: External ear normal.     Left Ear: External ear normal.     Nose: Nose normal.  Eyes:     General: No scleral icterus.    Conjunctiva/sclera: Conjunctivae normal.  Neck:     Musculoskeletal: Normal range of motion.     Trachea: Phonation normal.  Cardiovascular:     Rate and Rhythm: Normal rate and regular rhythm.  Pulmonary:     Effort: Pulmonary effort is normal. No respiratory distress.     Breath sounds: No stridor.  Abdominal:     General: There is no distension.     Tenderness: There is no abdominal tenderness. There is no guarding or rebound.  Musculoskeletal: Normal range of motion.  Neurological:     Mental Status: She is alert and oriented to person, place, and time.  Psychiatric:        Behavior: Behavior normal.     ED Results and Treatments Labs (all labs ordered are listed, but only abnormal results are displayed) Labs Reviewed  COMPREHENSIVE METABOLIC PANEL - Abnormal; Notable for the following components:      Result Value    Sodium 132 (*)    Chloride 97 (*)    Glucose, Bld 119 (*)    BUN 29 (*)    Creatinine, Ser 1.72 (*)    Calcium 11.0 (*)    Total Protein 6.4 (*)    Albumin 3.3 (*)    GFR calc non Af Amer 28 (*)    GFR calc Af Amer 32 (*)    All other components within normal limits  CBC - Abnormal; Notable for the following components:   RBC 3.58 (*)    Hemoglobin 10.3 (*)    HCT 33.2 (*)    All other components within normal limits  GASTROINTESTINAL PANEL BY PCR, STOOL (REPLACES STOOL CULTURE)  C DIFFICILE QUICK SCREEN W PCR REFLEX  OVA + PARASITE EXAM  LIPASE, BLOOD                                                                                                                         EKG  EKG Interpretation  Date/Time:  Ventricular Rate:    PR Interval:    QRS Duration:   QT Interval:    QTC Calculation:   R Axis:     Text Interpretation:        Radiology No results found. Pertinent labs & imaging results that were available during my care of the patient were reviewed by me and considered in my medical decision making (see chart for details).  Medications Ordered in ED Medications  sodium chloride 0.9 % bolus 1,000 mL (1,000 mLs Intravenous New Bag/Given 08/03/18 0206)                                                                                                                                    Procedures Procedures  (including critical care time)  Medical Decision Making / ED Course I have reviewed the nursing notes for this encounter and the patient's prior records (if available in EHR or on provided paperwork).    Patient presents with 1 month of watery diarrhea.  Intermittent abdominal discomfort.  She is afebrile with stable vital signs currently.  Abdomen benign at this time.  Orthostatics reassuring.  On review of records I did notice the low blood pressures at her primary care provider's office with systolics in the 14N.   Labs are grossly reassuring without  leukocytosis.  She has mild hyponatremia and hypo-chloremia and mild renal insufficiency likely due to dehydration.  We will provide patient with IV fluids.  We will also repeat GI panel, C. difficile and add ova and parasite which were not performed per patient.  We will also provide patient with contact information to GI for further work-up and management.  The patient appears reasonably screened and/or stabilized for discharge and I doubt any other medical condition or other [facility] requiring further screening, evaluation, or treatment in the ED at this time prior to discharge.  The patient is safe for discharge with strict return precautions.   Final Clinical Impression(s) / ED Diagnoses Final diagnoses:  Diarrhea, unspecified type  Dehydration  AKI (acute kidney injury) ([place])   Disposition: Discharge  Condition: Good  I have discussed the results, Dx and Tx plan with the patient and family who expressed understanding and agree(s) with the plan. Discharge instructions discussed at great length. The patient and family were given strict return precautions who verbalized understanding of the instructions. No further questions at time of discharge.    ED Discharge Orders    None       Follow Up: Kristen Loader, FNP [place] [id number] [phone]  Schedule an appointment as soon as possible for a visit  If symptoms do not improve or  worsen  Gastroenterology, [address] [place] [address] [phone]         This chart was dictated using voice recognition software.  Despite best efforts to proofread,  errors can occur which can  change the documentation meaning.   Fatima Blank, MD 08/03/18 [phone]

## 2018-08-03 NOTE — ED Notes (Signed)
Pt coming in from [place] after being seen today with diarrhea and reported hypotension per [facility]. Pt has had multiple episodes of diarrhea and one episode of vomiting while being in the waiting area. Abd pain rated 4 out of 10 currently. BP 105/63

## 2018-08-07 LAB — OVA + PARASITE EXAM

## 2018-08-07 LAB — O&P RESULT

## 2018-08-22 ENCOUNTER — Encounter: Payer: Self-pay | Admitting: Cardiology

## 2018-08-22 ENCOUNTER — Ambulatory Visit: Payer: Medicare Other | Admitting: Cardiology

## 2018-08-22 VITALS — BP 126/72 | HR 70 | Ht 64.0 in | Wt 131.0 lb

## 2018-08-22 DIAGNOSIS — I248 Other forms of acute ischemic heart disease: Secondary | ICD-10-CM

## 2018-08-22 DIAGNOSIS — I471 Supraventricular tachycardia: Secondary | ICD-10-CM

## 2018-08-22 DIAGNOSIS — A0472 Enterocolitis due to Clostridium difficile, not specified as recurrent: Secondary | ICD-10-CM | POA: Diagnosis not present

## 2018-08-22 NOTE — Progress Notes (Signed)
Cardiology Office Note:    Date:  08/22/2018   ID:  Meredith Moore, DOB Apr 15, 1936, MRN [id number]  PCP:  Kristen Loader, FNP  Cardiologist:  Candee Furbish, MD  Electrophysiologist:  None   Referring MD: Dineen Kid, MD     History of Present Illness:    Meredith Moore is a 83 y.o. female here for follow-up of recent atrial tachycardia during the hospital 07/03/2018 with troponin was 0.31 this increased to 0.68.  Likely demand ischemia in the setting of A. fib RVR.  Beta-blocker was added.  Echocardiogram showed normal EF.  Throughout her hospitalization, C. difficile colitis was diagnosed and she was taking vancomycin.  She is feeling much better.  She has not had any chest pain shortness of breath syncope.  She denies any radiation history.  No prior thrombosis.  She is not very active because of her eyesight and knee and hip replacement but she does not have any difficulty with movement.  No shortness of breath in other words.  Her pulmonary pressures were moderately elevated on echocardiogram.  Other than mild pulmonary vascular congestion, nothing on chest x-ray.  Her husband and daughter were here with her today.  Past Medical History:  Diagnosis Date  . Anemia   . Arthritis   . Blind 10/12/2016   Has learned Braille  . Complication of anesthesia    "Blood pressure has gone too low before and effected vision" just with general  . Complication of anesthesia    "woke up in severe panic attack" just with general  . Glaucoma   . Headache    hx of   . Herpes zoster with unspecified complication   . Hip dysplasia    right side  . History of transfusion 2014   2 units, no reaction-after knee replacement  . Hyperlipidemia    under control  . Macular degeneration   . Osteoarthrosis, unspecified whether generalized or localized, unspecified site   . Right hip pain    hx     Past Surgical History:  Procedure Laterality Date  . APPENDECTOMY  1979  .  BELPHAROPTOSIS REPAIR     eye lid drooping repair  . Left hip arthroplasty     05/10/17 Dr. Wynelle Link  . ovarian tumor   1979   benign tumor removed   . REVERSE SHOULDER ARTHROPLASTY Left 12/21/2017   Procedure: LEFT REVERSE SHOULDER ARTHROPLASTY;  Surgeon: Justice Britain, MD;  Location: [facility];  Service: Orthopedics;  Laterality: Left;  . ROBOTIC ASSISTED TOTAL HYSTERECTOMY WITH BILATERAL SALPINGO OOPHERECTOMY Bilateral 03/25/2014   Procedure: ROBOTIC ASSISTED TOTAL HYSTERECTOMY WITH BILATERAL SALPINGO OOPHORECTOMY;  Surgeon: Everitt Amber, MD;  Location: [facility];  Service: Gynecology;  Laterality: Bilateral;  . TONSILLECTOMY     1945   . TOTAL HIP ARTHROPLASTY Right 06/04/2014   Procedure: RIGHT TOTAL HIP ARTHROPLASTY ANTERIOR APPROACH;  Surgeon: Gearlean Alf, MD;  Location: [facility];  Service: Orthopedics;  Laterality: Right;  . TOTAL HIP ARTHROPLASTY Left 05/10/2017   Procedure: LEFT TOTAL HIP ARTHROPLASTY ANTERIOR APPROACH;  Surgeon: Gaynelle Arabian, MD;  Location[facility];  Service: Orthopedics;  Laterality: Left;  90 mins  . TOTAL KNEE ARTHROPLASTY Right 09/10/2012   Procedure: RIGHT TOTAL KNEE ARTHROPLASTY;  Surgeon: Gearlean Alf, MD;  [facility];  Service: Orthopedics;  Laterality: Right;    Current Medications: Current Meds  Medication Sig  . aspirin EC 81 MG tablet Take 81 mg daily by mouth.  [name] atorvastatin (LIPITOR) 10 MG  tablet Take 1/2 (one-half) tablet by mouth at bedtime for cholesterol (Patient taking differently: Take 5 mg by mouth at bedtime. Take 1/2 (one-half) tablet by mouth at bedtime for cholesterol)  . Calcium Carb-Cholecalciferol (CALCIUM+D3 PO) Take 1 tablet by mouth daily.  . Cholecalciferol (VITAMIN D3 PO) Take 1 tablet by mouth daily.  [name] latanoprost (XALATAN) 0.005 % ophthalmic solution Place 1 drop into both eyes at bedtime. 2300  . meclizine (ANTIVERT) 12.5 MG tablet Take one tablet by mouth up to four times daily as needed for vertigo  . Multiple  Vitamins-Minerals (PRESERVISION AREDS 2+MULTI VIT) CAPS Take 1 tablet by mouth 2 (two) times daily.  . QUEtiapine (SEROQUEL) 25 MG tablet 1/2-1 tablet at night as needed for rest (Patient taking differently: Take 12.5 mg by mouth at bedtime. )  . SIMBRINZA 1-0.2 % SUSP Place 1 drop into both eyes 3 (three) times daily with meals.   . timolol (TIMOPTIC) 0.5 % ophthalmic solution Place 1 drop into both eyes 2 (two) times daily. Breakfast & dinner.  . [DISCONTINUED] metoprolol tartrate (LOPRESSOR) 25 MG tablet Take 0.5 tablets (12.5 mg total) by mouth 2 (two) times daily. (Patient taking differently: Take 12.5 mg by mouth daily. )     Allergies:   Temazepam; Tramadol; Formaldehyde; and Nickel   Social History   Socioeconomic History  . Marital status: Married    Spouse name: Not on file  . Number of children: Not on file  . Years of education: Not on file  . Highest education level: Not on file  Occupational History  . Not on file  Social Needs  . Financial resource strain: Not on file  . Food insecurity:    Worry: Not on file    Inability: Not on file  . Transportation needs:    Medical: Not on file    Non-medical: Not on file  Tobacco Use  . Smoking status: Former Smoker    Packs/day: 0.25    Years: 15.00    Pack years: 3.75    Last attempt to quit: 08/01/1988    Years since quitting: 30.0  . Smokeless tobacco: Never Used  Substance and Sexual Activity  . Alcohol use: No  . Drug use: No  . Sexual activity: Yes  Lifestyle  . Physical activity:    Days per week: Not on file    Minutes per session: Not on file  . Stress: Not on file  Relationships  . Social connections:    Talks on phone: Not on file    Gets together: Not on file    Attends religious service: Not on file    Active member of club or organization: Not on file    Attends meetings of clubs or organizations: Not on file    Relationship status: Not on file  Other Topics Concern  . Not on file  Social History  Narrative   Married   Former smoker 1990   Alcohol none   Exercise walk daily about one mile, gym     Family History: The patient's family history includes Heart disease in her father and mother.  ROS:   Please see the history of present illness.    Weakness improving all other systems reviewed and are negative.  EKGs/Labs/Other Studies Reviewed:    The following studies were reviewed today: Hospital records echocardiogram EKG lab work  ECHO 07/04/18: - Left ventricle: The cavity size was normal. Systolic function was   normal. The estimated ejection fraction was in the  range of 55%   to 60%. Wall motion was normal; there were no regional wall   motion abnormalities. Features are consistent with a pseudonormal   left ventricular filling pattern, with concomitant abnormal   relaxation and increased filling pressure (grade 2 diastolic   dysfunction). - Aortic valve: There was trivial regurgitation. - Left atrium: The atrium was mildly dilated. - Right atrium: The atrium was moderately dilated. - Atrial septum: No defect or patent foramen ovale was identified. - Tricuspid valve: There was mild-moderate regurgitation. - Pulmonary arteries: Systolic pressure was moderately increased.   PA peak pressure: 45 mm Hg (S).  Impressions:  - Normal LV EF, grade 2 diastolic dysfunction, elevated PA systolic   pressure. Mild-moderate TR.  EKG:  EKG is not ordered today.  Previous EKGs reviewed.  Not atrial fibrillation.  Recent Labs: 07/03/2018: TSH 0.867 07/05/2018: Magnesium 1.9 08/02/2018: ALT 29; BUN 29; Creatinine, Ser 1.72; Hemoglobin 10.3; Platelets 276; Potassium 3.9; Sodium 132  Recent Lipid Panel    Component Value Date/Time   CHOL 192 10/11/2016 1108   CHOL 185 10/05/2015 0950   TRIG 78 10/11/2016 1108   HDL 69 10/11/2016 1108   HDL 67 10/05/2015 0950   CHOLHDL 2.8 10/11/2016 1108   VLDL 16 10/11/2016 1108   LDLCALC 107 (H) 10/11/2016 1108   LDLCALC 100 (H)  10/05/2015 0950    Physical Exam:    VS:  BP 126/72   Pulse 70   Ht 5\' 4"  (1.626 m)   Wt 131 lb (59.4 kg)   SpO2 99%   BMI 22.49 kg/m     Wt Readings from Last 3 Encounters:  08/22/18 131 lb (59.4 kg)  08/02/18 128 lb (58.1 kg)  07/03/18 130 lb (59 kg)     GEN:  Well nourished, well developed in no acute distress HEENT: Normal NECK: No JVD; No carotid bruits LYMPHATICS: No lymphadenopathy CARDIAC: RRR, no murmurs, rubs, gallops RESPIRATORY:  Clear to auscultation without rales, wheezing or rhonchi  ABDOMEN: Soft, non-tender, non-distended MUSCULOSKELETAL:  No edema; No deformity  SKIN: Warm and dry NEUROLOGIC:  Alert and oriented x 3 PSYCHIATRIC:  Normal affect   ASSESSMENT:    1. Paroxysmal atrial tachycardia ([place])   2. Demand ischemia ([place])   3. C. difficile colitis    PLAN:    In order of problems listed above:  Paroxysmal atrial tachycardia - This has improved.  I am comfortable with her coming off of her metoprolol 12.5 mg twice a day at this point.  Blood pressure is excellent, pulse is excellent.  There was no evidence of atrial fibrillation on telemetry.  C. difficile colitis -Vancomycin.  Finishing 14-day course.  She is following up soon about this.  Mildly elevated pulmonary pressures -Estimated on echocardiogram, 45 mmHg.  We will likely recheck echocardiogram in 1 month.  Elevated troponin -Demand ischemia in the setting of her atrial tachycardia and underlying C. difficile colitis.  She is not having any symptoms, no ischemic changes on ECG.  We will continue to modify risk factors, her blood pressure is excellent.  She is on low-dose statin.  No further testing at this time unless symptoms occur.   Medication Adjustments/Labs and Tests Ordered: Current medicines are reviewed at length with the patient today.  Concerns regarding medicines are outlined above.  No orders of the defined types were placed in this encounter.  No orders of the defined  types were placed in this encounter.   Patient Instructions  Medication Instructions:  You may discontinue your Metoprolol.  Continue all other medication as listed.  If you need a refill on your cardiac medications before your next appointment, please call your pharmacy.   Follow-Up: At [facility], you and your health needs are our priority.  As part of our continuing mission to provide you with exceptional heart care, we have created designated Provider Care Teams.  These Care Teams include your primary Cardiologist (physician) and Advanced Practice Providers (APPs -  Physician Assistants and Nurse Practitioners) who all work together to provide you with the care you need, when you need it. You will need a follow up appointment in 12 months.  Please call our office 2 months in advance to schedule this appointment.  You may see Candee Furbish, MD or one of the following Advanced Practice Providers on your designated Care Team:   Truitt Merle, NP Cecilie Kicks, NP . Kathyrn Drown, NP  Thank you for choosing [facility]!!         Signed, Candee Furbish, MD  08/22/2018 5:28 PM    [place]

## 2018-08-22 NOTE — Patient Instructions (Signed)
Medication Instructions:  You may discontinue your Metoprolol.  Continue all other medication as listed.  If you need a refill on your cardiac medications before your next appointment, please call your pharmacy.   Follow-Up: At [facility], you and your health needs are our priority.  As part of our continuing mission to provide you with exceptional heart care, we have created designated Provider Care Teams.  These Care Teams include your primary Cardiologist (physician) and Advanced Practice Providers (APPs -  Physician Assistants and Nurse Practitioners) who all work together to provide you with the care you need, when you need it. You will need a follow up appointment in 12 months.  Please call our office 2 months in advance to schedule this appointment.  You may see Candee Furbish, MD or one of the following Advanced Practice Providers on your designated Care Team:   Truitt Merle, NP Cecilie Kicks, NP . Kathyrn Drown, NP  Thank you for choosing [facility]!!

## 2018-09-12 ENCOUNTER — Ambulatory Visit: Payer: Medicare Other | Admitting: Internal Medicine

## 2018-09-18 ENCOUNTER — Ambulatory Visit: Payer: Medicare Other | Admitting: Internal Medicine

## 2018-10-13 ENCOUNTER — Other Ambulatory Visit: Payer: Self-pay | Admitting: Nurse Practitioner

## 2018-10-13 DIAGNOSIS — F5104 Psychophysiologic insomnia: Secondary | ICD-10-CM

## 2018-11-29 ENCOUNTER — Telehealth: Payer: Self-pay

## 2018-11-29 ENCOUNTER — Telehealth: Payer: Self-pay | Admitting: Cardiology

## 2018-11-29 NOTE — Telephone Encounter (Signed)

## 2018-11-29 NOTE — Telephone Encounter (Signed)
Please schedule patient for VIDEO visit with her husband present with Dr. Marlou Porch or an APP on the team. Heart monitor can be mailed if needed.

## 2018-11-29 NOTE — Telephone Encounter (Signed)
Set up PHONE visit on 5/5 with [facility] per [facility] request.   Virtual Visit Pre-Appointment Phone Call  "(Name), I am calling you today to discuss your upcoming appointment. We are currently trying to limit exposure to the virus that causes COVID-19 by seeing patients at home rather than in the office."  1. "What is the BEST phone number to call the day of the visit?" - include this in appointment notes  2. Do you have or have access to (through a family member/friend) a smartphone with video capability that we can use for your visit?" a. If yes - list this number in appt notes as cell (if different from BEST phone #) and list the appointment type as a VIDEO visit in appointment notes b. If no - list the appointment type as a PHONE visit in appointment notes  3. Confirm consent - "In the setting of the current Covid19 crisis, you are scheduled for a PHONE) visit with your provider on 5/5 at 120pm.  Just as we do with many in-office visits, in order for you to participate in this visit, we must obtain consent.  If you'd like, I can send this to your mychart (if signed up) or email for you to review.  Otherwise, I can obtain your verbal consent now.  All virtual visits are billed to your insurance company just like a normal visit would be.  By agreeing to a virtual visit, we'd like you to understand that the technology does not allow for your provider to perform an examination, and thus may limit your provider's ability to fully assess your condition. If your provider identifies any concerns that need to be evaluated in person, we will make arrangements to do so.  Finally, though the technology is pretty good, we cannot assure that it will always work on either your or our end, and in the setting of a video visit, we may have to convert it to a phone-only visit.  In either situation, we cannot ensure that we have a secure connection.  Are you willing to proceed?" STAFF: Did the patient verbally  acknowledge consent to telehealth visit? Document YES/NO here: YES  4. Advise patient to be prepared - "Two hours prior to your appointment, go ahead and check your blood pressure, pulse, oxygen saturation, and your weight (if you have the equipment to check those) and write them all down. When your visit starts, your provider will ask you for this information. If you have an Apple Watch or Kardia device, please plan to have heart rate information ready on the day of your appointment. Please have a pen and paper handy nearby the day of the visit as well."  5. Give patient instructions for MyChart download to smartphone OR [email or web address] as below if video visit (depending on what platform provider is using)  6. Inform patient they will receive a phone call 15 minutes prior to their appointment time (may be from unknown caller ID) so they should be prepared to answer    TELEPHONE CALL NOTE  Meredith Moore has been deemed a candidate for a follow-up tele-health visit to limit community exposure during the Covid-19 pandemic. I spoke with the patient via phone to ensure availability of phone/video source, confirm preferred email & phone number, and discuss instructions and expectations.  I reminded Meredith Moore to be prepared with any vital sign and/or heart rhythm information that could potentially be obtained via home monitoring, at the time of her visit. I  reminded Meredith Moore to expect a phone call prior to her visit.  [name], RN 11/29/2018 4:10 PM

## 2018-11-29 NOTE — Telephone Encounter (Signed)
Pt reports that a week ago when she was taking her pulse she realized that every 30 beats or so it would "skip a beat"  Pt is asymptomatic-denies CP, lightheadedness, diaphoresis, dizziness, etc  Pt reports her HR has been between 70's-80's bmp in the past week as she has continued checking her pulse and feeling the "skips" for the past week  Pt is concerned about a medication that she is taking and wants Dr. Marlou Porch to advise if she should continue. Pt reports being prescribed Metoprolol by Dr. Nevada Crane in January. However, it is not on her med list. Pt is blind and unable to tell me the dosage she is taking at this time but assures me that her husband helps with her meds. She reports "halfing" the pills.   Will route to [employer] and [employer] for [employer].

## 2018-11-29 NOTE — Telephone Encounter (Signed)
New message   Patient c/o Palpitations:  High priority if patient c/o lightheadedness, shortness of breath, or chest pain  1) How long have you had palpitations/irregular HR/ Afib? Are you having the symptoms now? About a week, yes   2) Are you currently experiencing lightheadedness, SOB or CP?no   3) Do you have a history of afib (atrial fibrillation) or irregular heart rhythm? yes  4) Have you checked your BP or HR? (document readings if available): no  5) Are you experiencing any other symptoms? No

## 2018-12-04 ENCOUNTER — Telehealth: Payer: Medicare Other | Admitting: Cardiology

## 2018-12-10 ENCOUNTER — Telehealth (INDEPENDENT_AMBULATORY_CARE_PROVIDER_SITE_OTHER): Payer: Medicare Other | Admitting: Cardiology

## 2018-12-10 ENCOUNTER — Encounter: Payer: Self-pay | Admitting: Cardiology

## 2018-12-10 ENCOUNTER — Other Ambulatory Visit: Payer: Self-pay

## 2018-12-10 ENCOUNTER — Telehealth: Payer: Self-pay | Admitting: *Deleted

## 2018-12-10 VITALS — Ht 64.0 in | Wt 130.0 lb

## 2018-12-10 DIAGNOSIS — I2729 Other secondary pulmonary hypertension: Secondary | ICD-10-CM

## 2018-12-10 DIAGNOSIS — I480 Paroxysmal atrial fibrillation: Secondary | ICD-10-CM | POA: Diagnosis not present

## 2018-12-10 DIAGNOSIS — I248 Other forms of acute ischemic heart disease: Secondary | ICD-10-CM

## 2018-12-10 MED ORDER — METOPROLOL TARTRATE 25 MG PO TABS: 12.5000 mg | ORAL_TABLET | Freq: Two times a day (BID) | ORAL | 2 refills | Status: DC

## 2018-12-10 NOTE — Patient Instructions (Addendum)
Medication Instructions:  Your physician has recommended you make the following change in your medication:   RESTART: metoprolol tartrate (lopressor) 12.5 mg twice a day   Lab work: None Ordered  If you have labs (blood work) drawn today and your tests are completely normal, you will receive your results only by: [name] MyChart Message (if you have MyChart) OR . A paper copy in the mail If you have any lab test that is abnormal or we need to change your treatment, we will call you to review the results.  Testing/Procedures: Your physician has recommended that you wear a 14 day long term monitor. You will be contacted to arrange monitor. These monitors are medical devices that record the heart's electrical activity. Doctors most often use these monitors to diagnose arrhythmias. Arrhythmias are problems with the speed or rhythm of the heartbeat. The monitor is a small, portable device. You can wear one while you do your normal daily activities. This is usually used to diagnose what is causing palpitations/syncope (passing out).  Follow-Up: Follow up with Dr. Marlou Porch via TELEPHONE Visit on 02/13/19 at 9:40 AM  Any Other Special Instructions Will Be Listed Below (If Applicable).

## 2018-12-10 NOTE — Telephone Encounter (Signed)
[employer] to mail a 14 day ZIO XT long term holter monitor to her home.  Instructions reviewed briefly as they are included in the monitor kit.

## 2018-12-10 NOTE — Progress Notes (Signed)
Virtual Visit via Telephone Note   This visit type was conducted due to national recommendations for restrictions regarding the COVID-19 Pandemic (e.g. social distancing) in an effort to limit this patient's exposure and mitigate transmission in our community.  Due to her co-morbid illnesses, this patient is at least at moderate risk for complications without adequate follow up.  This format is felt to be most appropriate for this patient at this time.  The patient did not have access to video technology/had technical difficulties with video requiring transitioning to audio format only (telephone).  All issues noted in this document were discussed and addressed.  No physical exam could be performed with this format.  Please refer to the patient's chart for her  consent to telehealth for [facility].   Date:  12/10/2018   ID:  Meredith Moore, DOB 1936-07-24, MRN [id number]  Patient Location: Home Provider Location: Home  PCP:  Kristen Loader, FNP  Cardiologist:  Candee Furbish, MD  Electrophysiologist:  None   Evaluation Performed:  Follow-Up Visit  Chief Complaint: Follow-up PAT/PAF  History of Present Illness:    Meredith Moore is a 83 y.o. female with paroxysmal atrial fibrillation here for follow-up.  At hospitalization in December 2019 with demand ischemia 0.68 EF normal on echo C. difficile colitis vancomycin.  Last seen in follow-up on 08/22/2018.  Pulmonary pressures were noted to be moderately elevated on echo.  Nothing on chest x-ray.  She is blind.  Not very active because of this and knee and hip replacement but no difficulty with movement.  No shortness of breath.  Pulse was elevated after walk with Charlie, skipped a beat, hesitated. In need of knee replacement. HR 70-80, occasional skip. 10 beats after that double time, fast (see 07/04/18 EKG).  She is able to hear the clicking of her clock every 60 seconds and this is how she has approximated her pulse.  Otherwise  feels fine.  No fevers chills nausea vomiting shortness of breath.  The patient does not have symptoms concerning for COVID-19 infection (fever, chills, cough, or new shortness of breath).    Past Medical History:  Diagnosis Date  . Anemia   . Arthritis   . Blind 10/12/2016   Has learned Braille  . Complication of anesthesia    "Blood pressure has gone too low before and effected vision" just with general  . Complication of anesthesia    "woke up in severe panic attack" just with general  . Glaucoma   . Headache    hx of   . Herpes zoster with unspecified complication   . Hip dysplasia    right side  . History of transfusion 2014   2 units, no reaction-after knee replacement  . Hyperlipidemia    under control  . Macular degeneration   . Osteoarthrosis, unspecified whether generalized or localized, unspecified site   . Right hip pain    hx    Past Surgical History:  Procedure Laterality Date  . APPENDECTOMY  1979  . BELPHAROPTOSIS REPAIR     eye lid drooping repair  . Left hip arthroplasty     05/10/17 Dr. Wynelle Link  . ovarian tumor   1979   benign tumor removed   . REVERSE SHOULDER ARTHROPLASTY Left 12/21/2017   Procedure: LEFT REVERSE SHOULDER ARTHROPLASTY;  Surgeon: Justice Britain, MD;  Location: [facility];  Service: Orthopedics;  Laterality: Left;  . ROBOTIC ASSISTED TOTAL HYSTERECTOMY WITH BILATERAL SALPINGO OOPHERECTOMY Bilateral 03/25/2014   Procedure: ROBOTIC  ASSISTED TOTAL HYSTERECTOMY WITH BILATERAL SALPINGO OOPHORECTOMY;  Surgeon: Everitt Amber, MD;  Location: [facility];  Service: Gynecology;  Laterality: Bilateral;  . TONSILLECTOMY     1945   . TOTAL HIP ARTHROPLASTY Right 06/04/2014   Procedure: RIGHT TOTAL HIP ARTHROPLASTY ANTERIOR APPROACH;  Surgeon: Gearlean Alf, MD;  Location: [facility];  Service: Orthopedics;  Laterality: Right;  . TOTAL HIP ARTHROPLASTY Left 05/10/2017   Procedure: LEFT TOTAL HIP ARTHROPLASTY ANTERIOR APPROACH;  Surgeon: Gaynelle Arabian, MD;   Location: [facility];  Service: Orthopedics;  Laterality: Left;  90 mins  . TOTAL KNEE ARTHROPLASTY Right 09/10/2012   Procedure: RIGHT TOTAL KNEE ARTHROPLASTY;  Surgeon: Gearlean Alf, MD;  Location: [facility];  Service: Orthopedics;  Laterality: Right;     Current Meds  Medication Sig  . aspirin EC 81 MG tablet Take 81 mg daily by mouth.  [name] atorvastatin (LIPITOR) 10 MG tablet Take 5 mg by mouth daily.  . Calcium Carb-Cholecalciferol (CALCIUM+D3 PO) Take 1 tablet by mouth daily.  . Cholecalciferol (VITAMIN D3 PO) Take 1 tablet by mouth daily.  [name] latanoprost (XALATAN) 0.005 % ophthalmic solution Place 1 drop into both eyes at bedtime. 2300  . meclizine (ANTIVERT) 12.5 MG tablet Take one tablet by mouth up to four times daily as needed for vertigo  . metoprolol tartrate (LOPRESSOR) 25 MG tablet Take 0.5 tablets (12.5 mg total) by mouth 2 (two) times daily.  . Multiple Vitamins-Minerals (PRESERVISION AREDS 2+MULTI VIT) CAPS Take 1 tablet by mouth 2 (two) times daily.  . QUEtiapine (SEROQUEL) 25 MG tablet Take 12.5 mg by mouth at bedtime.  [name] SIMBRINZA 1-0.2 % SUSP Place 1 drop into both eyes 3 (three) times daily with meals.   . timolol (TIMOPTIC) 0.5 % ophthalmic solution Place 1 drop into both eyes 2 (two) times daily. Breakfast & dinner.  . [DISCONTINUED] metoprolol tartrate (LOPRESSOR) 25 MG tablet Take 12.5 mg by mouth 2 (two) times daily.     Allergies:   Temazepam; Tramadol; Formaldehyde; and Nickel   Social History   Tobacco Use  . Smoking status: Former Smoker    Packs/day: 0.25    Years: 15.00    Pack years: 3.75    Last attempt to quit: 08/01/1988    Years since quitting: 30.3  . Smokeless tobacco: Never Used  Substance Use Topics  . Alcohol use: No  . Drug use: No     Family Hx: The patient's family history includes Heart disease in her father and mother.  ROS:   Please see the history of present illness.    Denies any fevers chills nausea vomiting syncope bleeding All  other systems reviewed and are negative.   Prior CV studies:   The following studies were reviewed today:  Echocardiogram 07/04/2018-EF 64% grade 2 diastolic dysfunction trivial AI mildly dilated left atrium moderately dilated right atrium PA pressure 45 mmHg mild to moderate TR.  Labs/Other Tests and Data Reviewed:    EKG:  An ECG dated 07/03/2018 was personally reviewed today and demonstrated:  Atrial fibrillation 160  Recent Labs: 07/03/2018: TSH 0.867 07/05/2018: Magnesium 1.9 08/02/2018: ALT 29; BUN 29; Creatinine, Ser 1.72; Hemoglobin 10.3; Platelets 276; Potassium 3.9; Sodium 132   Recent Lipid Panel Lab Results  Component Value Date/Time   CHOL 192 10/11/2016 11:08 AM   CHOL 185 10/05/2015 09:50 AM   TRIG 78 10/11/2016 11:08 AM   HDL 69 10/11/2016 11:08 AM   HDL 67 10/05/2015 09:50 AM   CHOLHDL 2.8 10/11/2016  11:08 AM   LDLCALC 107 (H) 10/11/2016 11:08 AM   LDLCALC 100 (H) 10/05/2015 09:50 AM    Wt Readings from Last 3 Encounters:  12/10/18 130 lb (59 kg)  08/22/18 131 lb (59.4 kg)  08/02/18 128 lb (58.1 kg)     Objective:    Vital Signs:  Ht 5\' 4"  (1.626 m)   Wt 130 lb (59 kg)   BMI 22.31 kg/m    VITAL SIGNS:  reviewed Pleasant, comfortable, alert, no respiratory distress noted  ASSESSMENT & PLAN:    Paroxysmal atrial fibrillation/paroxysmal atrial tachycardia At prior visit we had stopped her metoprolol 12.5 mg twice a day.  Blood pressure was excellent.  There was no evidence of atrial fibrillation on further telemetry monitoring.  Her EKG, last one in our system, seems to demonstrate clearly sinus rhythm and then an increase in heart rate, likely paroxysmal atrial tachycardia.  Of course, if atrial fibrillation were to return, we would need to have discussion about anticoagulation to help prevent stroke.  -Start metoprolol 12.5mg  PO BID, restart this medication to see if this helps with her increased beats.  The "double time "that she is describing looks very  similar to her EKG as above.  Could be PAT.  - Women in family CVA.   - Zio patch. 14 day.  This could all be paroxysmal atrial tachycardia however if paroxysmal atrial fibrillation is present, given her age of 24, she should be on anticoagulation.  We will see.  Demand ischemia -Likely in the setting of her underlying medical illness and tachycardia.  This does portend a worsened prognosis.  Continue with low-dose statin.  Modify risk factors.  Secondary pulmonary hypertension - Pressures 45 mmHg.  Continue with daily exercise.  No apparent underlying lung condition.  COVID-19 Education: The signs and symptoms of COVID-19 were discussed with the patient and how to seek care for testing (follow up with PCP or arrange E-visit).  The importance of social distancing was discussed today.  Time:   Today, I have spent 15 minutes with the patient with telehealth technology discussing the above problems.     Medication Adjustments/Labs and Tests Ordered: Current medicines are reviewed at length with the patient today.  Concerns regarding medicines are outlined above.   Tests Ordered: Orders Placed This Encounter  Procedures  . LONG TERM MONITOR (3-14 DAYS)    Medication Changes: Meds ordered this encounter  Medications  . metoprolol tartrate (LOPRESSOR) 25 MG tablet    Sig: Take 0.5 tablets (12.5 mg total) by mouth 2 (two) times daily.    Dispense:  30 tablet    Refill:  2    Disposition:  Follow up in 2 month(s)  Signed, Candee Furbish, MD  12/10/2018 11:20 AM    [place]

## 2018-12-14 ENCOUNTER — Ambulatory Visit (INDEPENDENT_AMBULATORY_CARE_PROVIDER_SITE_OTHER): Payer: Medicare Other

## 2018-12-14 DIAGNOSIS — I480 Paroxysmal atrial fibrillation: Secondary | ICD-10-CM

## 2018-12-27 ENCOUNTER — Telehealth: Payer: Self-pay | Admitting: *Deleted

## 2018-12-27 NOTE — Telephone Encounter (Signed)
    [facility] Pre-operative Risk Assessment    Request for surgical clearance:  1. What type of surgery is being performed? LEFT TOTAL KNEE  2. When is this surgery scheduled? 02/25/19  3. What type of clearance is required (medical clearance vs. Pharmacy clearance to hold med vs. Both)? MEDICAL  4. Are there any medications that need to be held prior to surgery and how long? ASA   5. Practice name and name of physician performing surgery? [facility]; DR. Wynelle Link  6. What is your office phone number [phone]   7.   What is your office fax number [phone]  8.   Anesthesia type (None, local, MAC, general) ? CHOICE   Julaine Hua 12/27/2018, 1:37 PM  _________________________________________________________________   (provider comments below)

## 2018-12-27 NOTE — Telephone Encounter (Signed)
Dr. Marlou Porch, Looks like you saw this patient earlier this month and questioned atrial tachycardia vs Afib. She also had demand ischemia in 07/2018.  We are being asked to clear for knee surgery. Do your recommend waiting until after zio patch results? Does she need any further testing with her history of demand ischemia?   Thanks Angie

## 2019-01-03 NOTE — Telephone Encounter (Signed)
Spoke to Meredith Moore on the phone.  She just turned in her ZIO patch monitor last Friday.  Awaiting processing.  She states that she did not have any symptoms during the monitoring.  In fact she saw her primary care physician recently who did an EKG on her which was reportedly normal.  She has not had any chest discomfort no anginal symptoms with 4 METS of activity.  During hospitalization in December with mildly elevated troponin, subsequent echocardiogram showed normal pump function.  She is recently done well with prior surgical procedures.  Barring any significant abnormal a arrhythmias on the ZIO patch monitor, which I do not expect, she may proceed with her knee replacement by Dr. Wynelle Link with low to moderate risk based mainly upon age. Candee Furbish, MD

## 2019-01-03 NOTE — Telephone Encounter (Signed)
   Primary Cardiologist: Candee Furbish, MD  Chart reviewed. See Dr Marlou Porch comments from 01/03/2019.  Given past medical history and time since last visit, based on ACC/AHA guidelines, Meredith Moore would be at acceptable risk for the planned procedure without further cardiovascular testing.   OK to hold aspirin pre op if needed.  I will route this recommendation to the requesting party via Epic fax function and remove from pre-op pool.  Please call with questions.  Kerin Ransom, PA-C 01/03/2019, 2:27 PM

## 2019-01-07 ENCOUNTER — Other Ambulatory Visit: Payer: Self-pay

## 2019-01-11 ENCOUNTER — Telehealth: Payer: Self-pay | Admitting: Cardiology

## 2019-01-11 NOTE — Telephone Encounter (Signed)
New Message   Patient is returning call in reference to monitor results.

## 2019-01-11 NOTE — Telephone Encounter (Signed)
Spoke to the patient and informed her of monitor results and Dr Marlou Porch' recommendation.  She verbalized understanding.

## 2019-02-04 ENCOUNTER — Telehealth: Payer: Self-pay

## 2019-02-04 NOTE — Telephone Encounter (Signed)

## 2019-02-13 ENCOUNTER — Other Ambulatory Visit: Payer: Self-pay

## 2019-02-13 ENCOUNTER — Telehealth (INDEPENDENT_AMBULATORY_CARE_PROVIDER_SITE_OTHER): Payer: Medicare Other | Admitting: Cardiology

## 2019-02-13 ENCOUNTER — Encounter: Payer: Self-pay | Admitting: Cardiology

## 2019-02-13 VITALS — Wt 130.0 lb

## 2019-02-13 DIAGNOSIS — I248 Other forms of acute ischemic heart disease: Secondary | ICD-10-CM

## 2019-02-13 DIAGNOSIS — I480 Paroxysmal atrial fibrillation: Secondary | ICD-10-CM | POA: Diagnosis not present

## 2019-02-13 DIAGNOSIS — I2729 Other secondary pulmonary hypertension: Secondary | ICD-10-CM

## 2019-02-13 DIAGNOSIS — I471 Supraventricular tachycardia: Secondary | ICD-10-CM

## 2019-02-13 NOTE — H&P (Signed)
TOTAL KNEE ADMISSION H&P  Patient is being admitted for left total knee arthroplasty.  Subjective:  Chief Complaint:left knee pain.  HPI: Meredith Moore, 83 y.o. female, has a history of pain and functional disability in the left knee due to arthritis and has failed non-surgical conservative treatments for greater than 12 weeks to includecorticosteriod injections, use of assistive devices and activity modification.  Onset of symptoms was gradual, starting several years ago with gradually worsening course since that time. The patient noted no past surgery on the left knee(s).  Patient currently rates pain in the left knee(s) at 7 out of 10 with activity. Patient has worsening of pain with activity and weight bearing, pain that interferes with activities of daily living, crepitus and instability.  Patient has evidence of bone on bone OA in the lateral compartments with osteophyte formation along the lateral femoral condyle, valgus deformity by imaging studies. There is no active infection.  Patient Active Problem List   Diagnosis Date Noted  . Paroxysmal atrial tachycardia ([place]) 07/03/2018  . Hypokalemia 07/03/2018  . Normocytic anemia 07/03/2018  . Mild renal insufficiency 07/03/2018  . Elevated troponin 07/03/2018  . Viral gastroenteritis 07/03/2018  . S/p reverse total shoulder arthroplasty 12/21/2017  . Blind 10/12/2016  . Medication management 10/12/2016  . Insomnia 10/07/2015  . Glaucoma 10/07/2015  . OA (osteoarthritis) of hip 06/04/2014  . Macular degeneration 09/18/2013  . Osteopenia 09/18/2013  . Hyperlipidemia 03/14/2013  . OA (osteoarthritis) of knee 09/10/2012   Past Medical History:  Diagnosis Date  . Anemia   . Arthritis   . Blind 10/12/2016   Has learned Braille  . Complication of anesthesia    "Blood pressure has gone too low before and effected vision" just with general  . Complication of anesthesia    "woke up in severe panic attack" just with general  .  Glaucoma   . Headache    hx of   . Herpes zoster with unspecified complication   . Hip dysplasia    right side  . History of transfusion 2014   2 units, no reaction-after knee replacement  . Hyperlipidemia    under control  . Macular degeneration   . Osteoarthrosis, unspecified whether generalized or localized, unspecified site   . Right hip pain    hx     Past Surgical History:  Procedure Laterality Date  . APPENDECTOMY  1979  . BELPHAROPTOSIS REPAIR     eye lid drooping repair  . Left hip arthroplasty     05/10/17 Dr. Wynelle Link  . ovarian tumor   1979   benign tumor removed   . REVERSE SHOULDER ARTHROPLASTY Left 12/21/2017   Procedure: LEFT REVERSE SHOULDER ARTHROPLASTY;  Surgeon: Justice Britain, MD;  Location: [facility];  Service: Orthopedics;  Laterality: Left;  . ROBOTIC ASSISTED TOTAL HYSTERECTOMY WITH BILATERAL SALPINGO OOPHERECTOMY Bilateral 03/25/2014   Procedure: ROBOTIC ASSISTED TOTAL HYSTERECTOMY WITH BILATERAL SALPINGO OOPHORECTOMY;  Surgeon: Everitt Amber, MD;  Location: [facility];  Service: Gynecology;  Laterality: Bilateral;  . TONSILLECTOMY     1945   . TOTAL HIP ARTHROPLASTY Right 06/04/2014   Procedure: RIGHT TOTAL HIP ARTHROPLASTY ANTERIOR APPROACH;  Surgeon: Gearlean Alf, MD;  Location: [facility];  Service: Orthopedics;  Laterality: Right;  . TOTAL HIP ARTHROPLASTY Left 05/10/2017   Procedure: LEFT TOTAL HIP ARTHROPLASTY ANTERIOR APPROACH;  Surgeon: Gaynelle Arabian, MD;  Location: [facility];  Service: Orthopedics;  Laterality: Left;  90 mins  . TOTAL KNEE ARTHROPLASTY Right 09/10/2012   Procedure:  RIGHT TOTAL KNEE ARTHROPLASTY;  Surgeon: Gearlean Alf, MD;  Location: [facility];  Service: Orthopedics;  Laterality: Right;    No current facility-administered medications for this encounter.    Current Outpatient Medications  Medication Sig Dispense Refill Last Dose  . aspirin EC 81 MG tablet Take 81 mg daily by mouth.   Taking  . atorvastatin (LIPITOR) 10 MG tablet Take 5 mg by  mouth daily.   Taking  . Calcium Carb-Cholecalciferol (CALCIUM+D3 PO) Take 1 tablet by mouth daily.   Taking  . Cholecalciferol (VITAMIN D3 PO) Take 1 tablet by mouth daily.   Taking  . ferrous sulfate 324 MG TBEC Take 324 mg by mouth daily with breakfast.   Taking  . latanoprost (XALATAN) 0.005 % ophthalmic solution Place 1 drop into both eyes at bedtime. [place]  . meclizine (ANTIVERT) 12.5 MG tablet Take one tablet by mouth up to four times daily as needed for vertigo 30 tablet 5 Taking  . metoprolol tartrate (LOPRESSOR) 25 MG tablet Take 0.5 tablets (12.5 mg total) by mouth 2 (two) times daily. 30 tablet 2 Taking  . Multiple Vitamins-Minerals (PRESERVISION AREDS 2+MULTI VIT) CAPS Take 1 tablet by mouth 2 (two) times daily.   Taking  . QUEtiapine (SEROQUEL) 25 MG tablet Take 12.5 mg by mouth at bedtime.   Taking  . SIMBRINZA 1-0.2 % SUSP Place 1 drop into both eyes 3 (three) times daily with meals.    Taking  . timolol (TIMOPTIC) 0.5 % ophthalmic solution Place 1 drop into both eyes 2 (two) times daily. Breakfast & dinner.   Taking   Allergies  Allergen Reactions  . Temazepam   . Tramadol     Fine when taking it- did not sleep for 6 weeks after stopping medication  . Formaldehyde Rash  . Nickel Rash    Social History   Tobacco Use  . Smoking status: Former Smoker    Packs/day: 0.25    Years: 15.00    Pack years: 3.75    Quit date: 08/01/1988    Years since quitting: 30.5  . Smokeless tobacco: Never Used  Substance Use Topics  . Alcohol use: No    Family History  Problem Relation Age of Onset  . Heart disease Mother   . Heart disease Father      Review of Systems  Constitutional: Negative for chills and fever.  HENT: Negative for congestion, sore throat and tinnitus.   Eyes: Negative for double vision, photophobia and pain.  Respiratory: Negative for cough, shortness of breath and wheezing.   Cardiovascular: Negative for chest pain, palpitations and orthopnea.   Gastrointestinal: Negative for heartburn, nausea and vomiting.  Genitourinary: Negative for dysuria, frequency and urgency.  Musculoskeletal: Positive for joint pain.  Neurological: Negative for dizziness, weakness and headaches.    Objective:  Physical Exam  Well nourished and well developed.  General: Alert and oriented x3, cooperative and pleasant, no acute distress.  Head: normocephalic, atraumatic, neck supple.  Eyes: EOMI.  Respiratory: breath sounds clear in all fields, no wheezing, rales, or rhonchi. Cardiovascular: Regular rate and rhythm, no murmurs, gallops or rubs.  Abdomen: non-tender to palpation and soft, normoactive bowel sounds. Musculoskeletal:  Left Knee Exam:  Trace effusion No Swelling. Range of motion is 0-125 degrees.  Moderate crepitus on range of motion of the knee.  Positive lateral joint line tenderness. Valgus deformity. Stable knee.  Calves soft and nontender. Motor function intact in LE. Strength 5/5 LE bilaterally. Neuro: Distal  pulses 2+. Sensation to light touch intact in LE.  Vital signs in last 24 hours: Weight:  [59 kg] 59 kg (07/15 0907)  Blood pressure: 120/72 mmHg Pulse: 60 bpm   Labs:   Estimated body mass index is 22.31 kg/m as calculated from the following:   Height as of 12/10/18: 5\' 4"  (1.626 m).   Weight as of 02/13/19: 59 kg.   Imaging Review Plain radiographs demonstrate severe degenerative joint disease of the left knee(s). The overall alignment issignificant valgus. The bone quality appears to be adequate for age and reported activity level.   Assessment/Plan:  End stage arthritis, left knee   The patient history, physical examination, clinical judgment of the provider and imaging studies are consistent with end stage degenerative joint disease of the left knee(s) and total knee arthroplasty is deemed medically necessary. The treatment options including medical management, injection therapy arthroscopy and  arthroplasty were discussed at length. The risks and benefits of total knee arthroplasty were presented and reviewed. The risks due to aseptic loosening, infection, stiffness, patella tracking problems, thromboembolic complications and other imponderables were discussed. The patient acknowledged the explanation, agreed to proceed with the plan and consent was signed. Patient is being admitted for inpatient treatment for surgery, pain control, PT, OT, prophylactic antibiotics, VTE prophylaxis, progressive ambulation and ADL's and discharge planning. The patient is planning to be discharged home.  Anticipated LOS equal to or greater than 2 midnights due to - Age 64 and older with one or more of the following:  - Obesity  - Expected need for hospital services (PT, OT, Nursing) required for safe  discharge  - Anticipated need for postoperative skilled nursing care or inpatient rehab  - Active co-morbidities: None OR   - Unanticipated findings during/Post Surgery: None  - Patient is a high risk of re-admission due to: None  Therapy Plans: HHPT then outpatient therapy at [facility] Disposition: Home with husband Planned DVT Prophylaxis: Aspirin 325 mg BID DME needed: None PCP: Genelle Bal, FNP Cardiologist: Candee Furbish, MD TXA: IV Allergies: Tramadol (insomnia), nickel (rash) Anesthesia Concerns: None BMI: 22.3 Last HgbA1c: 5.9%  Other:  PT NEEDS TITANIUM PROSTHESIS DUE TO NICKEL ALLERGY Patient is legally blind. Will need to avoid hypotension intraoperatively due to severe glaucoma and macular degeneration. Hx C. diff infection, will only give one dose abx intraoperatively, hold postop infusions. Required transfusion after previous right TKA. Most recent hemoglobin 11.6, takes ferrous sulfate.  - Patient was instructed on what medications to stop prior to surgery. - Follow-up visit in 2 weeks with Dr. Wynelle Link - Begin physical therapy following surgery - Pre-operative lab work as  pre-surgical testing - Prescriptions will be provided in hospital at time of discharge  Theresa Duty, PA-C Orthopedic Surgery [facility] [place]

## 2019-02-13 NOTE — Patient Instructions (Signed)
Medication Instructions:   Your physician recommends that you continue on your current medications as directed. Please refer to the Current Medication list given to you today.  If you need a refill on your cardiac medications before your next appointment, please call your pharmacy.     Follow-Up: At [facility], you and your health needs are our priority.  As part of our continuing mission to provide you with exceptional heart care, we have created designated Provider Care Teams.  These Care Teams include your primary Cardiologist (physician) and Advanced Practice Providers (APPs -  Physician Assistants and Nurse Practitioners) who all work together to provide you with the care you need, when you need it.  Your physician wants you to follow-up in: [place] will receive a reminder letter in the mail two months in advance. If you don't receive a letter, please call our office to schedule the follow-up appointment.

## 2019-02-13 NOTE — Progress Notes (Signed)
Virtual Visit via Telephone Note   This visit type was conducted due to national recommendations for restrictions regarding the COVID-19 Pandemic (e.g. social distancing) in an effort to limit this patient's exposure and mitigate transmission in our community.  Due to her co-morbid illnesses, this patient is at least at moderate risk for complications without adequate follow up.  This format is felt to be most appropriate for this patient at this time.  The patient did not have access to video technology/had technical difficulties with video requiring transitioning to audio format only (telephone).  All issues noted in this document were discussed and addressed.  No physical exam could be performed with this format.  Please refer to the patient's chart for her  consent to telehealth for [facility].   Date:  02/13/2019   ID:  Meredith Moore, DOB 04/07/1936, MRN [id number]  Patient Location: Home Provider Location: Home  PCP:  Meredith Loader, FNP  Cardiologist:  Meredith Furbish, MD  Electrophysiologist:  None   Evaluation Performed:  Follow-Up Visit  Chief Complaint: Atrial fibrillation follow-up  History of Present Illness:    Meredith Moore is a 83 y.o. female with paroxysmal atrial fibrillation noted during hospitalization for C. difficile colitis in December 2019 with demand ischemia and normal ejection fraction.  Had felt an occasional skipped beat.  Monitor was reassuring with no evidence of atrial fibrillation.  Knee replacent   The patient does not have symptoms concerning for COVID-19 infection (fever, chills, cough, or new shortness of breath).    Past Medical History:  Diagnosis Date  . Anemia   . Arthritis   . Blind 10/12/2016   Has learned Braille  . Complication of anesthesia    "Blood pressure has gone too low before and effected vision" just with general  . Complication of anesthesia    "woke up in severe panic attack" just with general  . Glaucoma    . Headache    hx of   . Herpes zoster with unspecified complication   . Hip dysplasia    right side  . History of transfusion 2014   2 units, no reaction-after knee replacement  . Hyperlipidemia    under control  . Macular degeneration   . Osteoarthrosis, unspecified whether generalized or localized, unspecified site   . Right hip pain    hx    Past Surgical History:  Procedure Laterality Date  . APPENDECTOMY  1979  . BELPHAROPTOSIS REPAIR     eye lid drooping repair  . Left hip arthroplasty     05/10/17 Dr. Wynelle Link  . ovarian tumor   1979   benign tumor removed   . REVERSE SHOULDER ARTHROPLASTY Left 12/21/2017   Procedure: LEFT REVERSE SHOULDER ARTHROPLASTY;  Surgeon: Justice Britain, MD;  Location: [place];  Service: Orthopedics;  Laterality: Left;  . ROBOTIC ASSISTED TOTAL HYSTERECTOMY WITH BILATERAL SALPINGO OOPHERECTOMY Bilateral 03/25/2014   Procedure: ROBOTIC ASSISTED TOTAL HYSTERECTOMY WITH BILATERAL SALPINGO OOPHORECTOMY;  Surgeon: Everitt Amber, MD;  Location[facility];  Service: Gynecology;  Laterality: Bilateral;  . TONSILLECTOMY     1945   . TOTAL HIP ARTHROPLASTY Right 06/04/2014   Procedure: RIGHT TOTAL HIP ARTHROPLASTY ANTERIOR APPROACH;  Surgeon: Gearlean Alf, MD;  Location[facility];  Service: Orthopedics;  Laterality: Right;  . TOTAL HIP ARTHROPLASTY Left 05/10/2017   Procedure: LEFT TOTAL HIP ARTHROPLASTY ANTERIOR APPROACH;  Surgeon: Gaynelle Arabian, MD;  [facility];  Service: Orthopedics;  Laterality: Left;  90 mins  .  TOTAL KNEE ARTHROPLASTY Right 09/10/2012   Procedure: RIGHT TOTAL KNEE ARTHROPLASTY;  Surgeon: Gearlean Alf, MD;  [facility];  Service: Orthopedics;  Laterality: Right;     Current Meds  Medication Sig  . aspirin EC 81 MG tablet Take 81 mg daily by mouth.  [name] atorvastatin (LIPITOR) 10 MG tablet Take 5 mg by mouth daily.  . Calcium Carb-Cholecalciferol (CALCIUM+D3 PO) Take 1 tablet by mouth daily.  . Cholecalciferol (VITAMIN D3 PO) Take 1  tablet by mouth daily.  . ferrous sulfate 324 MG TBEC Take 324 mg by mouth daily with breakfast.  . latanoprost (XALATAN) 0.005 % ophthalmic solution Place 1 drop into both eyes at bedtime. 2300  . meclizine (ANTIVERT) 12.5 MG tablet Take one tablet by mouth up to four times daily as needed for vertigo  . metoprolol tartrate (LOPRESSOR) 25 MG tablet Take 0.5 tablets (12.5 mg total) by mouth 2 (two) times daily.  . Multiple Vitamins-Minerals (PRESERVISION AREDS 2+MULTI VIT) CAPS Take 1 tablet by mouth 2 (two) times daily.  . QUEtiapine (SEROQUEL) 25 MG tablet Take 12.5 mg by mouth at bedtime.  [name] SIMBRINZA 1-0.2 % SUSP Place 1 drop into both eyes 3 (three) times daily with meals.   . timolol (TIMOPTIC) 0.5 % ophthalmic solution Place 1 drop into both eyes 2 (two) times daily. Breakfast & dinner.     Allergies:   Temazepam, Tramadol, Formaldehyde, and Nickel   Social History   Tobacco Use  . Smoking status: Former Smoker    Packs/day: 0.25    Years: 15.00    Pack years: 3.75    Quit date: 08/01/1988    Years since quitting: 30.5  . Smokeless tobacco: Never Used  Substance Use Topics  . Alcohol use: No  . Drug use: No     Family Hx: The patient's family history includes Heart disease in her father and mother.  ROS:   Please see the history of present illness.    No fevers chills nausea vomiting cough syncope bleeding All other systems reviewed and are negative.   Prior CV studies:   The following studies were reviewed today:  Event monitor 01/07/2019:   Sinus rhythm predominant. No atrial fibrillation identified.   There are brief runs of paroxysmal atrial tachycardia which correlate with her symptoms.   Occasional PVCs, ventricular bigeminy, trigeminy.   One episode of second-degree heart block type I, [place]bach    Reassuring that we did not see any evidence of atrial fibrillation. Her symptomatology is consistent with her previously described paroxysmal atrial  tachycardia. We will continue with low-dose metoprolol.  Meredith Furbish, MD  Labs/Other Tests and Data Reviewed:    EKG:  Prior EKG on 07/03/2018 showed atrial fibrillation heart rate 123.  Subsequent monitor reports have shown sinus rhythm occasional PVCs  Recent Labs: 07/03/2018: TSH 0.867 07/05/2018: Magnesium 1.9 08/02/2018: ALT 29; BUN 29; Creatinine, Ser 1.72; Hemoglobin 10.3; Platelets 276; Potassium 3.9; Sodium 132   Recent Lipid Panel Lab Results  Component Value Date/Time   CHOL 192 10/11/2016 11:08 AM   CHOL 185 10/05/2015 09:50 AM   TRIG 78 10/11/2016 11:08 AM   HDL 69 10/11/2016 11:08 AM   HDL 67 10/05/2015 09:50 AM   CHOLHDL 2.8 10/11/2016 11:08 AM   LDLCALC 107 (H) 10/11/2016 11:08 AM   LDLCALC 100 (H) 10/05/2015 09:50 AM    Wt Readings from Last 3 Encounters:  02/13/19 130 lb (59 kg)  12/10/18 130 lb (59 kg)  08/22/18 131 lb (  59.4 kg)     Objective:    Vital Signs:  Wt 130 lb (59 kg)   BMI 22.31 kg/m    VITAL SIGNS:  reviewed blood pressure under good control, pleasant, able to complete full sentences without difficulty.  ASSESSMENT & PLAN:    Paroxysmal atrial fibrillation -Recent monitor demonstrated sinus rhythm.  Excellent.  Low-dose metoprolol helpful.  Preoperative risk stratification -She may proceed with knee replacement with low overall cardiac risk.  No high risk symptoms.  If atrial fibrillation were to develop please do not hesitate to let us know.  Demand ischemia -In the setting of C. difficile colitis.  Continuing with secondary risk factor modification.  Secondary pulmonary hypertension -Echocardiogram estimated pressures were 45 mmHg pulmonary artery.  Continue with exercising.  There are no apparent underlying lung conditions.  Good blood pressure control.  COVID-19 Education: The signs and symptoms of COVID-19 were discussed with the patient and how to seek care for testing (follow up with PCP or arrange E-visit).  The importance of  social distancing was discussed today.  Time:   Today, I have spent 11 minutes with the patient with telehealth technology discussing the above problems.     Medication Adjustments/Labs and Tests Ordered: Current medicines are reviewed at length with the patient today.  Concerns regarding medicines are outlined above.   Tests Ordered: No orders of the defined types were placed in this encounter.   Medication Changes: No orders of the defined types were placed in this encounter.   Follow Up:  Virtual Visit or In Person in 1 year(s)  Signed, Meredith Furbish, MD  02/13/2019 9:33 AM    [place]

## 2019-02-21 ENCOUNTER — Other Ambulatory Visit (HOSPITAL_COMMUNITY)
Admission: RE | Admit: 2019-02-21 | Discharge: 2019-02-21 | Disposition: A | Discharge status: Home / Self Care | Payer: Medicare Other | Source: Ambulatory Visit | Attending: Orthopedic Surgery | Admitting: Orthopedic Surgery

## 2019-02-21 DIAGNOSIS — Z1159 Encounter for screening for other viral diseases: Secondary | ICD-10-CM | POA: Insufficient documentation

## 2019-02-21 LAB — SARS CORONAVIRUS 2 (TAT 6-24 HRS): SARS Coronavirus 2: NEGATIVE

## 2019-02-21 NOTE — Patient Instructions (Addendum)
YOU HAD  A COVID 19 TEST ON 02-21-2019. ONCE YOUR COVID TEST IS COMPLETED, PLEASE BEGIN THE QUARANTINE INSTRUCTIONS AS OUTLINED IN YOUR HANDOUT.                ESHAL PROPPS    Your procedure is scheduled on: 02-25-2019   Report to [facility] Main  Entrance    Report to admitting at 800 AM    1 VISITOR IS ALLOWED TO WAIT IN WAITING ROOM  ONLY DAY OF YOUR SURGERY.    Call this number if you have problems the morning of surgery [phone]    Remember: [place], NO CHEWING GUM CANDY OR MINTS.   NO SOLID FOOD AFTER MIDNIGHT THE NIGHT PRIOR TO SURGERY.    NOTHING BY MOUTH EXCEPT CLEAR LIQUIDS UNTIL  730 AM.    PLEASE FINISH ENSURE DRINK PER SURGEON ORDER 3 HOURS PRIOR TO SCHEDULED SURGERY TIME WHICH NEEDS TO BE COMPLETED AT 730 AM.   CLEAR LIQUID DIET  Foods Allowed                                                                     Foods Excluded  Water, Black Coffee and tea, regular and decaf                             liquids that you cannot  Plain Jell-O any favor except red or purple                                           see through such as: Fruit ices (not with fruit pulp)                                     milk, soups, orange juice  Iced Popsicles                                    All solid food Carbonated beverages, regular and diet                                    Apple juices Sports drinks like Gatorade Lightly seasoned clear broth or consume(fat free) Sugar, honey syrup  Sample Menu Breakfast                                Lunch                                     Supper Cranberry juice                    Beef broth  Chicken broth Jell-O                                     Grape juice                           Apple juice Coffee or tea                        Jell-O                                      Popsicle                                                Coffee  or tea                        Coffee or tea  _____________________________________________________________________     Take these medicines the morning of surgery with A SIP OF WATER: Metoprolol   May use eye drops day of surgery                               You may not have any metal on your body including hair pins and              piercings  Do not wear jewelry, make-up, lotions, powders or perfumes, deodorant             Do not wear nail polish.  Do not shave  48 hours prior to surgery.              Do not bring valuables to the hospital. [place].  Contacts, dentures or bridgework may not be worn into surgery.  Leave suitcase in the car. After surgery it may be brought to your room.    _____________________________________________________________________             [facility] - Preparing for Surgery Before surgery, you can play an important role.  Because skin is not sterile, your skin needs to be as free of germs as possible.  You can reduce the number of germs on your skin by washing with CHG (chlorahexidine gluconate) soap before surgery.  CHG is an antiseptic cleaner which kills germs and bonds with the skin to continue killing germs even after washing. Please DO NOT use if you have an allergy to CHG or antibacterial soaps.  If your skin becomes reddened/irritated stop using the CHG and inform your nurse when you arrive at Short Stay. Do not shave (including legs and underarms) for at least 48 hours prior to the first CHG shower.  You may shave your face/neck. Please follow these instructions carefully:  1.  Shower with CHG Soap the night before surgery and the  morning of Surgery.  2.  If you choose to wash your hair, wash your hair first as usual with your  normal  shampoo.  3.  After you shampoo, rinse your hair  and body thoroughly to remove the  shampoo.                             4.  Use CHG as you would any other  liquid soap.  You can apply chg directly  to the skin and wash                       Gently with a scrungie or clean washcloth.  5.  Apply the CHG Soap to your body ONLY FROM THE NECK DOWN.   Do not use on face/ open                           Wound or open sores. Avoid contact with eyes, ears mouth and genitals (private parts).                       Wash face,  Genitals (private parts) with your normal soap.             6.  Wash thoroughly, paying special attention to the area where your surgery  will be performed.  7.  Thoroughly rinse your body with warm water from the neck down.  8.  DO NOT shower/wash with your normal soap after using and rinsing off  the CHG Soap.                9.  Pat yourself dry with a clean towel.            10.  Wear clean pajamas.            11.  Place clean sheets on your bed the night of your first shower and do not  sleep with pets. Day of Surgery : Do not apply any lotions/deodorants the morning of surgery.  Please wear clean clothes to the hospital/surgery center.  FAILURE TO FOLLOW THESE INSTRUCTIONS MAY RESULT IN THE CANCELLATION OF YOUR SURGERY PATIENT SIGNATURE_________________________________  NURSE SIGNATURE__________________________________  ________________________________________________________________________   Adam Phenix  An incentive spirometer is a tool that can help keep your lungs clear and active. This tool measures how well you are filling your lungs with each breath. Taking long deep breaths may help reverse or decrease the chance of developing breathing (pulmonary) problems (especially infection) following:  A long period of time when you are unable to move or be active. BEFORE THE PROCEDURE   If the spirometer includes an indicator to show your best effort, your nurse or respiratory therapist will set it to a desired goal.  If possible, sit up straight or lean slightly forward. Try not to slouch.  Hold the incentive  spirometer in an upright position. INSTRUCTIONS FOR USE  1. Sit on the edge of your bed if possible, or sit up as far as you can in bed or on a chair. 2. Hold the incentive spirometer in an upright position. 3. Breathe out normally. 4. Place the mouthpiece in your mouth and seal your lips tightly around it. 5. Breathe in slowly and as deeply as possible, raising the piston or the ball toward the top of the column. 6. Hold your breath for 3-5 seconds or for as long as possible. Allow the piston or ball to fall to the bottom of the column. 7. Remove the mouthpiece from your mouth and breathe out normally. 8. Rest for a few seconds and  repeat Steps 1 through 7 at least 10 times every 1-2 hours when you are awake. Take your time and take a few normal breaths between deep breaths. 9. The spirometer may include an indicator to show your best effort. Use the indicator as a goal to work toward during each repetition. 10. After each set of 10 deep breaths, practice coughing to be sure your lungs are clear. If you have an incision (the cut made at the time of surgery), support your incision when coughing by placing a pillow or rolled up towels firmly against it. Once you are able to get out of bed, walk around indoors and cough well. You may stop using the incentive spirometer when instructed by your caregiver.  RISKS AND COMPLICATIONS  Take your time so you do not get dizzy or light-headed.  If you are in pain, you may need to take or ask for pain medication before doing incentive spirometry. It is harder to take a deep breath if you are having pain. AFTER USE  Rest and breathe slowly and easily.  It can be helpful to keep track of a log of your progress. Your caregiver can provide you with a simple table to help with this. If you are using the spirometer at home, follow these instructions: [place] IF:   You are having difficultly using the spirometer.  You have trouble using the  spirometer as often as instructed.  Your pain medication is not giving enough relief while using the spirometer.  You develop fever of 100.5 F (38.1 C) or higher. SEEK IMMEDIATE MEDICAL CARE IF:   You cough up bloody sputum that had not been present before.  You develop fever of 102 F (38.9 C) or greater.  You develop worsening pain at or near the incision site. MAKE SURE YOU:   Understand these instructions.  Will watch your condition.  Will get help right away if you are not doing well or get worse. Document Released: 11/28/2006 Document Revised: 10/10/2011 Document Reviewed: 01/29/2007 ExitCare Patient Information 2014 [employer], [place].   ________________________________________________________________________  WHAT IS A BLOOD TRANSFUSION? Blood Transfusion Information  A transfusion is the replacement of blood or some of its parts. Blood is made up of multiple cells which provide different functions.  Red blood cells carry oxygen and are used for blood loss replacement.  White blood cells fight against infection.  Platelets control bleeding.  Plasma helps clot blood.  Other blood products are available for specialized needs, such as hemophilia or other clotting disorders. BEFORE THE TRANSFUSION  Who gives blood for transfusions?   Healthy volunteers who are fully evaluated to make sure their blood is safe. This is blood bank blood. Transfusion therapy is the safest it has ever been in the practice of medicine. Before blood is taken from a donor, a complete history is taken to make sure that person has no history of diseases nor engages in risky social behavior (examples are intravenous drug use or sexual activity with multiple partners). The donor's travel history is screened to minimize risk of transmitting infections, such as malaria. The donated blood is tested for signs of infectious diseases, such as HIV and hepatitis. The blood is then tested to be sure it is  compatible with you in order to minimize the chance of a transfusion reaction. If you or a relative donates blood, this is often done in anticipation of surgery and is not appropriate for emergency situations. It takes many days to process the donated  blood. RISKS AND COMPLICATIONS Although transfusion therapy is very safe and saves many lives, the main dangers of transfusion include:   Getting an infectious disease.  Developing a transfusion reaction. This is an allergic reaction to something in the blood you were given. Every precaution is taken to prevent this. The decision to have a blood transfusion has been considered carefully by your caregiver before blood is given. Blood is not given unless the benefits outweigh the risks. AFTER THE TRANSFUSION  Right after receiving a blood transfusion, you will usually feel much better and more energetic. This is especially true if your red blood cells have gotten low (anemic). The transfusion raises the level of the red blood cells which carry oxygen, and this usually causes an energy increase.  The nurse administering the transfusion will monitor you carefully for complications. HOME CARE INSTRUCTIONS  No special instructions are needed after a transfusion. You may find your energy is better. Speak with your caregiver about any limitations on activity for underlying diseases you may have. SEEK MEDICAL CARE IF:   Your condition is not improving after your transfusion.  You develop redness or irritation at the intravenous (IV) site. SEEK IMMEDIATE MEDICAL CARE IF:  Any of the following symptoms occur over the next 12 hours:  Shaking chills.  You have a temperature by mouth above 102 F (38.9 C), not controlled by medicine.  Chest, back, or muscle pain.  People around you feel you are not acting correctly or are confused.  Shortness of breath or difficulty breathing.  Dizziness and fainting.  You get a rash or develop hives.  You have  a decrease in urine output.  Your urine turns a dark color or changes to pink, red, or brown. Any of the following symptoms occur over the next 10 days:  You have a temperature by mouth above 102 F (38.9 C), not controlled by medicine.  Shortness of breath.  Weakness after normal activity.  The white part of the eye turns yellow (jaundice).  You have a decrease in the amount of urine or are urinating less often.  Your urine turns a dark color or changes to pink, red, or brown. Document Released: 07/15/2000 Document Revised: 10/10/2011 Document Reviewed: 03/03/2008 [facility] Patient Information 2014 [place], [place].  _______________________________________________________________________

## 2019-02-21 NOTE — Progress Notes (Signed)
EKG 07-03-18 Epic CHEST XRAY 07-03-18 Epic CARDIAC CLEARANCE DR Marlou Porch 02-13-19 Epic ECHO 07-04-18 EPIC

## 2019-02-22 ENCOUNTER — Encounter (HOSPITAL_COMMUNITY): Payer: Self-pay

## 2019-02-22 ENCOUNTER — Other Ambulatory Visit: Payer: Self-pay

## 2019-02-22 ENCOUNTER — Encounter (HOSPITAL_COMMUNITY)
Admission: RE | Admit: 2019-02-22 | Discharge: 2019-02-22 | Disposition: A | Discharge status: Home / Self Care | Payer: Medicare Other | Source: Ambulatory Visit | Attending: Orthopedic Surgery | Admitting: Orthopedic Surgery

## 2019-02-22 DIAGNOSIS — Z96612 Presence of left artificial shoulder joint: Secondary | ICD-10-CM | POA: Insufficient documentation

## 2019-02-22 DIAGNOSIS — Z01818 Encounter for other preprocedural examination: Secondary | ICD-10-CM | POA: Diagnosis present

## 2019-02-22 DIAGNOSIS — I272 Pulmonary hypertension, unspecified: Secondary | ICD-10-CM | POA: Insufficient documentation

## 2019-02-22 DIAGNOSIS — E785 Hyperlipidemia, unspecified: Secondary | ICD-10-CM | POA: Diagnosis not present

## 2019-02-22 DIAGNOSIS — Z79899 Other long term (current) drug therapy: Secondary | ICD-10-CM | POA: Diagnosis not present

## 2019-02-22 DIAGNOSIS — Z7982 Long term (current) use of aspirin: Secondary | ICD-10-CM | POA: Diagnosis not present

## 2019-02-22 DIAGNOSIS — H3530 Unspecified macular degeneration: Secondary | ICD-10-CM | POA: Diagnosis not present

## 2019-02-22 DIAGNOSIS — Z87891 Personal history of nicotine dependence: Secondary | ICD-10-CM | POA: Diagnosis not present

## 2019-02-22 DIAGNOSIS — H409 Unspecified glaucoma: Secondary | ICD-10-CM | POA: Diagnosis not present

## 2019-02-22 DIAGNOSIS — Z96643 Presence of artificial hip joint, bilateral: Secondary | ICD-10-CM | POA: Diagnosis not present

## 2019-02-22 DIAGNOSIS — M1712 Unilateral primary osteoarthritis, left knee: Secondary | ICD-10-CM | POA: Insufficient documentation

## 2019-02-22 DIAGNOSIS — H547 Unspecified visual loss: Secondary | ICD-10-CM | POA: Insufficient documentation

## 2019-02-22 DIAGNOSIS — Z96651 Presence of right artificial knee joint: Secondary | ICD-10-CM | POA: Insufficient documentation

## 2019-02-22 HISTORY — DX: Enterocolitis due to Clostridium difficile, not specified as recurrent: A04.72

## 2019-02-22 HISTORY — DX: Personal history of other diseases of the circulatory system: Z86.79

## 2019-02-22 HISTORY — DX: Migraine, unspecified, not intractable, without status migrainosus: G43.909

## 2019-02-22 HISTORY — DX: Iron deficiency anemia, unspecified: D50.9

## 2019-02-22 LAB — COMPREHENSIVE METABOLIC PANEL
ALT: 15 U/L (ref 0–44)
AST: 21 U/L (ref 15–41)
Albumin: 4.4 g/dL (ref 3.5–5.0)
Alkaline Phosphatase: 49 U/L (ref 38–126)
Anion gap: 8 (ref 5–15)
BUN: 25 mg/dL — ABNORMAL HIGH (ref 8–23)
CO2: 23 mmol/L (ref 22–32)
Calcium: 9.2 mg/dL (ref 8.9–10.3)
Chloride: 104 mmol/L (ref 98–111)
Creatinine, Ser: 1.05 mg/dL — ABNORMAL HIGH (ref 0.44–1.00)
GFR calc Af Amer: 58 mL/min — ABNORMAL LOW (ref >60)
GFR calc non Af Amer: 50 mL/min — ABNORMAL LOW (ref >60)
Glucose, Bld: 107 mg/dL — ABNORMAL HIGH (ref 70–99)
Potassium: 4.7 mmol/L (ref 3.5–5.1)
Sodium: 135 mmol/L (ref 135–145)
Total Bilirubin: 0.4 mg/dL (ref 0.3–1.2)
Total Protein: 7.1 g/dL (ref 6.5–8.1)

## 2019-02-22 LAB — CBC
HCT: 34.9 % — ABNORMAL LOW (ref 36.0–46.0)
Hemoglobin: 11 g/dL — ABNORMAL LOW (ref 12.0–15.0)
MCH: 31 pg (ref 26.0–34.0)
MCHC: 31.5 g/dL (ref 30.0–36.0)
MCV: 98.3 fL (ref 80.0–100.0)
Platelets: 257 10*3/uL (ref 150–400)
RBC: 3.55 MIL/uL — ABNORMAL LOW (ref 3.87–5.11)
RDW: 13 % (ref 11.5–15.5)
WBC: 6.1 10*3/uL (ref 4.0–10.5)
nRBC: 0 % (ref 0.0–0.2)

## 2019-02-22 LAB — SURGICAL PCR SCREEN
MRSA, PCR: NEGATIVE
Staphylococcus aureus: NEGATIVE

## 2019-02-22 LAB — APTT: aPTT: 33 seconds (ref 24–36)

## 2019-02-22 LAB — PROTIME-INR
INR: 1 (ref 0.8–1.2)
Prothrombin Time: 13.3 seconds (ref 11.4–15.2)

## 2019-02-22 NOTE — Progress Notes (Signed)
SPOKE W/  Toney     SCREENING SYMPTOMS OF COVID 19:   COUGH--NO  RUNNY NOSE---NO   SORE THROAT---NO  NASAL CONGESTION----NO  SNEEZING----NO  SHORTNESS OF BREATH---NO  DIFFICULTY BREATHING---NO  TEMP >100.0 -----NO  UNEXPLAINED BODY ACHES------NO  CHILLS -------- NO  HEADACHES ---------NO  LOSS OF SMELL/ TASTE --------NO    HAVE YOU OR ANY FAMILY MEMBER TRAVELLED PAST 14 DAYS OUT OF THE   COUNTY---NO STATE----NO COUNTRY----NO  HAVE YOU OR ANY FAMILY MEMBER BEEN EXPOSED TO ANYONE WITH COVID 19? NO

## 2019-02-22 NOTE — Progress Notes (Addendum)
Anesthesia Chart Review   Case: [id number] Date/Time: 02/25/19 1015   Procedure: TOTAL KNEE ARTHROPLASTY (Left ) - 35min   Anesthesia type: Choice   Pre-op diagnosis: left knee osteoarthritis   [facility]: [facility] ROOM 10 / [facility]   Surgeon: Gaynelle Arabian, MD      DISCUSSION:83 y.o. former smoker (3.75 pack years, quit 08/01/88) with h/o Afib (in setting of hospitalization for C. Diff colitis 07/2018), secondary pulmonary HTN (Echo 07/04/18 with PA peak pressure), HLD, migraines, left knee OA scheduled for above procedure 02/25/2019 with Dr. Gaynelle Arabian.   Pt last seen by cardiologist, Dr. Candee Furbish, via telemedicine 02/13/2019.  Per OV note, "She may proceed with knee replacement with low overall cardiac risk.  No high risk symptoms.  If atrial fibrillation were to develop please do not hesitate to let us know."  Anticipate pt can proceed with planned procedure barring acute status change.   VS: BP 138/81 (BP Location: Right Arm)   Pulse 62   Temp 37 C (Oral)   Resp 18   Ht 5\' 3"  (1.6 m)   Wt 59.9 kg   SpO2 100%   BMI 23.40 kg/m   PROVIDERS: Kristen Loader, FNP is PCP   Candee Furbish, MD is Cardiologist  LABS: Labs reviewed: Acceptable for surgery. (all labs ordered are listed, but only abnormal results are displayed)  Labs Reviewed  CBC - Abnormal; Notable for the following components:      Result Value   RBC 3.55 (*)    Hemoglobin 11.0 (*)    HCT 34.9 (*)    All other components within normal limits  COMPREHENSIVE METABOLIC PANEL - Abnormal; Notable for the following components:   Glucose, Bld 107 (*)    BUN 25 (*)    Creatinine, Ser 1.05 (*)    GFR calc non Af Amer 50 (*)    GFR calc Af Amer 58 (*)    All other components within normal limits  SURGICAL PCR SCREEN  APTT  PROTIME-INR  TYPE AND SCREEN     IMAGES:   EKG: 02/22/2019 Rate 62 bpm Normal sinus rhythm Low voltage QRS Borderline ECG Run of atrial fibrillation absent compared to previous  CV: Echo  07/04/18 Study Conclusions  - Left ventricle: The cavity size was normal. Systolic function was   normal. The estimated ejection fraction was in the range of 55%   to 60%. Wall motion was normal; there were no regional wall   motion abnormalities. Features are consistent with a pseudonormal   left ventricular filling pattern, with concomitant abnormal   relaxation and increased filling pressure (grade 2 diastolic   dysfunction). - Aortic valve: There was trivial regurgitation. - Left atrium: The atrium was mildly dilated. - Right atrium: The atrium was moderately dilated. - Atrial septum: No defect or patent foramen ovale was identified. - Tricuspid valve: There was mild-moderate regurgitation. - Pulmonary arteries: Systolic pressure was moderately increased.   PA peak pressure: 45 mm Hg (S).  Impressions:  - Normal LV EF, grade 2 diastolic dysfunction, elevated PA systolic   pressure. Mild-moderate TR.  Past Medical History:  Diagnosis Date  . Arthritis   . Blind 10/12/2016   Has learned Braille  . C. difficile colitis   . Complication of anesthesia    "Blood pressure has gone too low before and effected vision" just with general  . Complication of anesthesia    "woke up in severe panic attack" just with general  . Glaucoma   .  Herpes zoster with unspecified complication   . Hip dysplasia    right side  . History of atrial fibrillation    RVR  . History of sinus tachycardia   . History of transfusion 2014   2 units, no reaction-after knee replacement  . Hyperlipidemia    under control  . Iron deficiency anemia   . Macular degeneration   . Migraine    hx of   . Osteoarthrosis, unspecified whether generalized or localized, unspecified site   . Right hip pain    hx     Past Surgical History:  Procedure Laterality Date  . APPENDECTOMY  1979  . BELPHAROPTOSIS REPAIR     eye lid drooping repair  . COLONOSCOPY    . ovarian tumor   1979   benign tumor removed    . REVERSE SHOULDER ARTHROPLASTY Left 12/21/2017   Procedure: LEFT REVERSE SHOULDER ARTHROPLASTY;  Surgeon: Justice Britain, MD;  Location: [place];  Service: Orthopedics;  Laterality: Left;  . ROBOTIC ASSISTED TOTAL HYSTERECTOMY WITH BILATERAL SALPINGO OOPHERECTOMY Bilateral 03/25/2014   Procedure: ROBOTIC ASSISTED TOTAL HYSTERECTOMY WITH BILATERAL SALPINGO OOPHORECTOMY;  Surgeon: Everitt Amber, MD;  [facility];  Service: Gynecology;  Laterality: Bilateral;  . TONSILLECTOMY     1945   . TOTAL HIP ARTHROPLASTY Right 06/04/2014   Procedure: RIGHT TOTAL HIP ARTHROPLASTY ANTERIOR APPROACH;  Surgeon: Gearlean Alf, MD;  [facility];  Service: Orthopedics;  Laterality: Right;  . TOTAL HIP ARTHROPLASTY Left 05/10/2017   Procedure: LEFT TOTAL HIP ARTHROPLASTY ANTERIOR APPROACH;  Surgeon: Gaynelle Arabian, MD;  [facility];  Service: Orthopedics;  Laterality: Left;  90 mins  . TOTAL KNEE ARTHROPLASTY Right 09/10/2012   Procedure: RIGHT TOTAL KNEE ARTHROPLASTY;  Surgeon: Gearlean Alf, MD;  [facility];  Service: Orthopedics;  Laterality: Right;    MEDICATIONS: . acetaminophen (TYLENOL) 650 MG CR tablet  . aspirin EC 81 MG tablet  . atorvastatin (LIPITOR) 10 MG tablet  . Calcium Carb-Cholecalciferol (CALCIUM+D3 PO)  . Cholecalciferol (VITAMIN D3) 50 MCG (2000 UT) TABS  . ferrous sulfate 324 MG TBEC  . latanoprost (XALATAN) 0.005 % ophthalmic solution  . meclizine (ANTIVERT) 12.5 MG tablet  . metoprolol tartrate (LOPRESSOR) 25 MG tablet  . Multiple Vitamins-Minerals (PRESERVISION AREDS 2+MULTI VIT) CAPS  . QUEtiapine (SEROQUEL) 25 MG tablet  . SIMBRINZA 1-0.2 % SUSP  . timolol (TIMOPTIC) 0.5 % ophthalmic solution   No current facility-administered medications for this encounter.     Maia Plan [facility] Pre-Surgical Testing [phone] 02/22/19  4:40 PM

## 2019-02-22 NOTE — Anesthesia Preprocedure Evaluation (Addendum)
Anesthesia Evaluation  Patient identified by MRN, date of birth, ID band Patient awake    Reviewed: Allergy & Precautions, NPO status , Patient's Chart, lab work & pertinent test results  History of Anesthesia Complications (+) history of anesthetic complications  Airway Mallampati: I  TM Distance: >3 FB Neck ROM: Full    Dental no notable dental hx. (+) Teeth Intact   Pulmonary neg pulmonary ROS, former smoker,    Pulmonary exam normal breath sounds clear to auscultation       Cardiovascular Normal cardiovascular exam+ dysrhythmias Atrial Fibrillation  Rhythm:Regular Rate:Normal     Neuro/Psych  Headaches, Macular degeneration ou Glaucoma ou Legally blind - sees outlines, distinguishes light and dark negative psych ROS   GI/Hepatic negative GI ROS, Neg liver ROS,   Endo/Other  Hyperlipidemia  Renal/GU Renal disease  negative genitourinary   Musculoskeletal  (+) Arthritis , Osteoarthritis,  OA left knee   Abdominal   Peds  Hematology  (+) anemia ,   Anesthesia Other Findings   Reproductive/Obstetrics                            Anesthesia Physical Anesthesia Plan  ASA: II  Anesthesia Plan: Spinal   Post-op Pain Management:  Regional for Post-op pain   Induction: Intravenous  PONV Risk Score and Plan: 3 and Ondansetron, Propofol infusion and Treatment may vary due to age or medical condition  Airway Management Planned: Natural Airway and Nasal Cannula  Additional Equipment:   Intra-op Plan:   Post-operative Plan:   Informed Consent: I have reviewed the patients History and Physical, chart, labs and discussed the procedure including the risks, benefits and alternatives for the proposed anesthesia with the patient or authorized representative who has indicated his/her understanding and acceptance.     Dental advisory given  Plan Discussed with: CRNA and  Surgeon  Anesthesia Plan Comments: (See PAT note 02/22/2019, Konrad Felix, PA-C)       Anesthesia Quick Evaluation

## 2019-02-24 MED ORDER — BUPIVACAINE LIPOSOME 1.3 % IJ SUSP
20.0000 mL | INTRAMUSCULAR | Status: DC
  Filled 2019-02-24: qty 20

## 2019-02-25 ENCOUNTER — Ambulatory Visit (HOSPITAL_COMMUNITY): Payer: Medicare Other | Admitting: Anesthesiology

## 2019-02-25 ENCOUNTER — Encounter (HOSPITAL_COMMUNITY): Payer: Self-pay | Admitting: *Deleted

## 2019-02-25 ENCOUNTER — Ambulatory Visit (HOSPITAL_COMMUNITY): Payer: Medicare Other | Admitting: Physician Assistant

## 2019-02-25 ENCOUNTER — Encounter (HOSPITAL_COMMUNITY)
Admission: RE | Disposition: A | Discharge status: Home / Self Care | Payer: Self-pay | Source: Home / Self Care | Attending: Orthopedic Surgery

## 2019-02-25 ENCOUNTER — Telehealth (HOSPITAL_COMMUNITY): Payer: Self-pay | Admitting: *Deleted

## 2019-02-25 ENCOUNTER — Other Ambulatory Visit: Payer: Self-pay

## 2019-02-25 ENCOUNTER — Ambulatory Visit (HOSPITAL_COMMUNITY)
Admission: RE | Admit: 2019-02-25 | Discharge: 2019-02-27 | Disposition: A | Discharge status: Home / Self Care | Payer: Medicare Other | Attending: Orthopedic Surgery | Admitting: Orthopedic Surgery

## 2019-02-25 DIAGNOSIS — Z90710 Acquired absence of both cervix and uterus: Secondary | ICD-10-CM | POA: Diagnosis not present

## 2019-02-25 DIAGNOSIS — G4700 Insomnia, unspecified: Secondary | ICD-10-CM | POA: Insufficient documentation

## 2019-02-25 DIAGNOSIS — Z87891 Personal history of nicotine dependence: Secondary | ICD-10-CM | POA: Diagnosis not present

## 2019-02-25 DIAGNOSIS — Z79899 Other long term (current) drug therapy: Secondary | ICD-10-CM | POA: Diagnosis not present

## 2019-02-25 DIAGNOSIS — H409 Unspecified glaucoma: Secondary | ICD-10-CM | POA: Diagnosis not present

## 2019-02-25 DIAGNOSIS — Z96642 Presence of left artificial hip joint: Secondary | ICD-10-CM | POA: Insufficient documentation

## 2019-02-25 DIAGNOSIS — Z96651 Presence of right artificial knee joint: Secondary | ICD-10-CM | POA: Insufficient documentation

## 2019-02-25 DIAGNOSIS — D649 Anemia, unspecified: Secondary | ICD-10-CM | POA: Diagnosis not present

## 2019-02-25 DIAGNOSIS — E876 Hypokalemia: Secondary | ICD-10-CM | POA: Insufficient documentation

## 2019-02-25 DIAGNOSIS — Z91048 Other nonmedicinal substance allergy status: Secondary | ICD-10-CM | POA: Insufficient documentation

## 2019-02-25 DIAGNOSIS — Z96641 Presence of right artificial hip joint: Secondary | ICD-10-CM | POA: Insufficient documentation

## 2019-02-25 DIAGNOSIS — Z8249 Family history of ischemic heart disease and other diseases of the circulatory system: Secondary | ICD-10-CM | POA: Insufficient documentation

## 2019-02-25 DIAGNOSIS — Z96612 Presence of left artificial shoulder joint: Secondary | ICD-10-CM | POA: Diagnosis not present

## 2019-02-25 DIAGNOSIS — Z7982 Long term (current) use of aspirin: Secondary | ICD-10-CM | POA: Diagnosis not present

## 2019-02-25 DIAGNOSIS — I480 Paroxysmal atrial fibrillation: Secondary | ICD-10-CM | POA: Insufficient documentation

## 2019-02-25 DIAGNOSIS — M8580 Other specified disorders of bone density and structure, unspecified site: Secondary | ICD-10-CM | POA: Diagnosis not present

## 2019-02-25 DIAGNOSIS — Z885 Allergy status to narcotic agent status: Secondary | ICD-10-CM | POA: Insufficient documentation

## 2019-02-25 DIAGNOSIS — E785 Hyperlipidemia, unspecified: Secondary | ICD-10-CM | POA: Diagnosis not present

## 2019-02-25 DIAGNOSIS — Z888 Allergy status to other drugs, medicaments and biological substances status: Secondary | ICD-10-CM | POA: Insufficient documentation

## 2019-02-25 DIAGNOSIS — M179 Osteoarthritis of knee, unspecified: Secondary | ICD-10-CM | POA: Diagnosis present

## 2019-02-25 DIAGNOSIS — M1710 Unilateral primary osteoarthritis, unspecified knee: Secondary | ICD-10-CM | POA: Diagnosis present

## 2019-02-25 DIAGNOSIS — M1712 Unilateral primary osteoarthritis, left knee: Secondary | ICD-10-CM | POA: Insufficient documentation

## 2019-02-25 DIAGNOSIS — H3530 Unspecified macular degeneration: Secondary | ICD-10-CM | POA: Insufficient documentation

## 2019-02-25 DIAGNOSIS — H548 Legal blindness, as defined in USA: Secondary | ICD-10-CM | POA: Insufficient documentation

## 2019-02-25 HISTORY — PX: TOTAL KNEE ARTHROPLASTY: SHX125

## 2019-02-25 LAB — TYPE AND SCREEN
ABO/RH(D): A POS
Antibody Screen: NEGATIVE

## 2019-02-25 SURGERY — ARTHROPLASTY, KNEE, TOTAL
Anesthesia: Spinal | Laterality: Left

## 2019-02-25 MED ORDER — METHOCARBAMOL 500 MG IVPB - SIMPLE MED
500.0000 mg | Freq: Four times a day (QID) | INTRAVENOUS | Status: DC | PRN
  Filled 2019-02-25: qty 50

## 2019-02-25 MED ORDER — CEFAZOLIN SODIUM-DEXTROSE 2-4 GM/100ML-% IV SOLN
2.0000 g | INTRAVENOUS | Status: DC
  Filled 2019-02-25: qty 100

## 2019-02-25 MED ORDER — METHOCARBAMOL 500 MG PO TABS
500.0000 mg | ORAL_TABLET | Freq: Four times a day (QID) | ORAL | Status: DC | PRN
  Administered 2019-02-25 – 2019-02-27 (×4): 500 mg via ORAL
  Filled 2019-02-25 (×4): qty 1

## 2019-02-25 MED ORDER — PHENOL 1.4 % MT LIQD: 1.0000 | OROMUCOSAL | Status: DC | PRN

## 2019-02-25 MED ORDER — MORPHINE SULFATE (PF) 2 MG/ML IV SOLN: 0.5000 mg | INTRAVENOUS | Status: DC | PRN

## 2019-02-25 MED ORDER — CHLORHEXIDINE GLUCONATE 4 % EX LIQD: 60.0000 mL | Freq: Once | CUTANEOUS | Status: DC

## 2019-02-25 MED ORDER — POVIDONE-IODINE 10 % EX SWAB: 2.0000 "application " | Freq: Once | CUTANEOUS | Status: DC

## 2019-02-25 MED ORDER — ACETAMINOPHEN 10 MG/ML IV SOLN
1000.0000 mg | Freq: Four times a day (QID) | INTRAVENOUS | Status: DC
  Administered 2019-02-25: 11:00:00 1000 mg via INTRAVENOUS
  Filled 2019-02-25: qty 100

## 2019-02-25 MED ORDER — TRANEXAMIC ACID-NACL 1000-0.7 MG/100ML-% IV SOLN
1000.0000 mg | INTRAVENOUS | Status: AC
  Administered 2019-02-25: 11:00:00 1000 mg via INTRAVENOUS
  Filled 2019-02-25: qty 100

## 2019-02-25 MED ORDER — PROPOFOL 10 MG/ML IV BOLUS
INTRAVENOUS | Status: DC | PRN
  Administered 2019-02-25 (×2): 10 mg via INTRAVENOUS
  Administered 2019-02-25: 20 mg via INTRAVENOUS
  Administered 2019-02-25: 30 mg via INTRAVENOUS

## 2019-02-25 MED ORDER — DEXAMETHASONE SODIUM PHOSPHATE 10 MG/ML IJ SOLN: 8.0000 mg | Freq: Once | INTRAMUSCULAR | Status: DC

## 2019-02-25 MED ORDER — METOCLOPRAMIDE HCL 5 MG PO TABS: 5.0000 mg | ORAL_TABLET | Freq: Three times a day (TID) | ORAL | Status: DC | PRN

## 2019-02-25 MED ORDER — SODIUM CHLORIDE 0.9 % IR SOLN
Status: DC | PRN
  Administered 2019-02-25: 1000 mL

## 2019-02-25 MED ORDER — ONDANSETRON HCL 4 MG/2ML IJ SOLN: 4.0000 mg | Freq: Four times a day (QID) | INTRAMUSCULAR | Status: DC | PRN

## 2019-02-25 MED ORDER — ATORVASTATIN CALCIUM 10 MG PO TABS
5.0000 mg | ORAL_TABLET | Freq: Every day | ORAL | Status: DC
  Administered 2019-02-25 – 2019-02-26 (×2): 5 mg via ORAL
  Filled 2019-02-25 (×2): qty 1

## 2019-02-25 MED ORDER — ACETAMINOPHEN 500 MG PO TABS
1000.0000 mg | ORAL_TABLET | Freq: Four times a day (QID) | ORAL | Status: AC
  Administered 2019-02-25 – 2019-02-26 (×4): 1000 mg via ORAL
  Filled 2019-02-25 (×4): qty 2

## 2019-02-25 MED ORDER — BUPIVACAINE IN DEXTROSE 0.75-8.25 % IT SOLN
INTRATHECAL | Status: DC | PRN
  Administered 2019-02-25: 1.6 mL via INTRATHECAL

## 2019-02-25 MED ORDER — BISACODYL 10 MG RE SUPP: 10.0000 mg | Freq: Every day | RECTAL | Status: DC | PRN

## 2019-02-25 MED ORDER — DOCUSATE SODIUM 100 MG PO CAPS
100.0000 mg | ORAL_CAPSULE | Freq: Two times a day (BID) | ORAL | Status: DC
  Administered 2019-02-25 – 2019-02-27 (×4): 100 mg via ORAL
  Filled 2019-02-25 (×4): qty 1

## 2019-02-25 MED ORDER — LATANOPROST 0.005 % OP SOLN
1.0000 [drp] | Freq: Every day | OPHTHALMIC | Status: DC
  Administered 2019-02-25 – 2019-02-26 (×2): 1 [drp] via OPHTHALMIC
  Filled 2019-02-25: qty 2.5

## 2019-02-25 MED ORDER — MENTHOL 3 MG MT LOZG: 1.0000 | LOZENGE | OROMUCOSAL | Status: DC | PRN

## 2019-02-25 MED ORDER — CLONIDINE HCL (ANALGESIA) 100 MCG/ML EP SOLN
EPIDURAL | Status: DC | PRN
  Administered 2019-02-25: 50 ug

## 2019-02-25 MED ORDER — EPHEDRINE SULFATE-NACL 50-0.9 MG/10ML-% IV SOSY
PREFILLED_SYRINGE | INTRAVENOUS | Status: DC | PRN
  Administered 2019-02-25 (×2): 5 mg via INTRAVENOUS
  Administered 2019-02-25: 10 mg via INTRAVENOUS

## 2019-02-25 MED ORDER — MECLIZINE HCL 25 MG PO TABS: 12.5000 mg | ORAL_TABLET | Freq: Three times a day (TID) | ORAL | Status: DC | PRN

## 2019-02-25 MED ORDER — ONDANSETRON HCL 4 MG PO TABS: 4.0000 mg | ORAL_TABLET | Freq: Four times a day (QID) | ORAL | Status: DC | PRN

## 2019-02-25 MED ORDER — OXYCODONE HCL 5 MG PO TABS
5.0000 mg | ORAL_TABLET | ORAL | Status: DC | PRN
  Administered 2019-02-27 (×2): 5 mg via ORAL
  Filled 2019-02-25 (×2): qty 1

## 2019-02-25 MED ORDER — PROPOFOL 10 MG/ML IV BOLUS
INTRAVENOUS | Status: AC
  Filled 2019-02-25: qty 60

## 2019-02-25 MED ORDER — PROPOFOL 500 MG/50ML IV EMUL
INTRAVENOUS | Status: DC | PRN
  Administered 2019-02-25: 50 ug/kg/min via INTRAVENOUS

## 2019-02-25 MED ORDER — SODIUM CHLORIDE (PF) 0.9 % IJ SOLN
INTRAMUSCULAR | Status: AC
  Filled 2019-02-25: qty 10

## 2019-02-25 MED ORDER — ASPIRIN EC 325 MG PO TBEC
325.0000 mg | DELAYED_RELEASE_TABLET | Freq: Two times a day (BID) | ORAL | Status: DC
  Administered 2019-02-26 – 2019-02-27 (×3): 325 mg via ORAL
  Filled 2019-02-25 (×3): qty 1

## 2019-02-25 MED ORDER — SODIUM CHLORIDE 0.9 % IV SOLN
INTRAVENOUS | Status: DC
  Administered 2019-02-25 – 2019-02-26 (×2): via INTRAVENOUS

## 2019-02-25 MED ORDER — METOCLOPRAMIDE HCL 5 MG/ML IJ SOLN: 5.0000 mg | Freq: Three times a day (TID) | INTRAMUSCULAR | Status: DC | PRN

## 2019-02-25 MED ORDER — SODIUM CHLORIDE (PF) 0.9 % IJ SOLN
INTRAMUSCULAR | Status: DC | PRN
  Administered 2019-02-25: 60 mL

## 2019-02-25 MED ORDER — TIMOLOL MALEATE 0.5 % OP SOLN
1.0000 [drp] | Freq: Two times a day (BID) | OPHTHALMIC | Status: DC
  Administered 2019-02-25 – 2019-02-27 (×4): 1 [drp] via OPHTHALMIC
  Filled 2019-02-25: qty 5

## 2019-02-25 MED ORDER — DEXAMETHASONE SODIUM PHOSPHATE 10 MG/ML IJ SOLN
INTRAMUSCULAR | Status: AC
  Filled 2019-02-25: qty 1

## 2019-02-25 MED ORDER — ONDANSETRON HCL 4 MG/2ML IJ SOLN
INTRAMUSCULAR | Status: DC | PRN
  Administered 2019-02-25: 4 mg via INTRAVENOUS

## 2019-02-25 MED ORDER — OXYCODONE HCL 5 MG PO TABS
10.0000 mg | ORAL_TABLET | ORAL | Status: DC | PRN
  Administered 2019-02-26 (×2): 10 mg via ORAL
  Filled 2019-02-25 (×2): qty 2

## 2019-02-25 MED ORDER — TRANEXAMIC ACID-NACL 1000-0.7 MG/100ML-% IV SOLN
1000.0000 mg | Freq: Once | INTRAVENOUS | Status: AC
  Administered 2019-02-25: 1000 mg via INTRAVENOUS
  Filled 2019-02-25: qty 100

## 2019-02-25 MED ORDER — FENTANYL CITRATE (PF) 100 MCG/2ML IJ SOLN: 25.0000 ug | INTRAMUSCULAR | Status: DC | PRN

## 2019-02-25 MED ORDER — ONDANSETRON HCL 4 MG/2ML IJ SOLN: 4.0000 mg | Freq: Once | INTRAMUSCULAR | Status: DC | PRN

## 2019-02-25 MED ORDER — DEXAMETHASONE SODIUM PHOSPHATE 10 MG/ML IJ SOLN
INTRAMUSCULAR | Status: DC | PRN
  Administered 2019-02-25: 6 mg via INTRAVENOUS

## 2019-02-25 MED ORDER — POLYETHYLENE GLYCOL 3350 17 G PO PACK: 17.0000 g | PACK | Freq: Every day | ORAL | Status: DC | PRN

## 2019-02-25 MED ORDER — EPHEDRINE 5 MG/ML INJ
INTRAVENOUS | Status: AC
  Filled 2019-02-25: qty 10

## 2019-02-25 MED ORDER — DIPHENHYDRAMINE HCL 12.5 MG/5ML PO ELIX: 12.5000 mg | ORAL_SOLUTION | ORAL | Status: DC | PRN

## 2019-02-25 MED ORDER — QUETIAPINE FUMARATE 25 MG PO TABS
12.5000 mg | ORAL_TABLET | Freq: Every day | ORAL | Status: DC
  Administered 2019-02-25 – 2019-02-26 (×2): 12.5 mg via ORAL
  Filled 2019-02-25 (×2): qty 1

## 2019-02-25 MED ORDER — ONDANSETRON HCL 4 MG/2ML IJ SOLN
INTRAMUSCULAR | Status: AC
  Filled 2019-02-25: qty 2

## 2019-02-25 MED ORDER — SODIUM CHLORIDE (PF) 0.9 % IJ SOLN
INTRAMUSCULAR | Status: AC
  Filled 2019-02-25: qty 50

## 2019-02-25 MED ORDER — DEXAMETHASONE SODIUM PHOSPHATE 10 MG/ML IJ SOLN
10.0000 mg | Freq: Once | INTRAMUSCULAR | Status: AC
  Administered 2019-02-26: 10 mg via INTRAVENOUS
  Filled 2019-02-25: qty 1

## 2019-02-25 MED ORDER — BUPIVACAINE-EPINEPHRINE (PF) 0.5% -1:200000 IJ SOLN
INTRAMUSCULAR | Status: DC | PRN
  Administered 2019-02-25: 30 mL via PERINEURAL

## 2019-02-25 MED ORDER — LACTATED RINGERS IV SOLN
INTRAVENOUS | Status: DC
  Administered 2019-02-25: 09:00:00 via INTRAVENOUS

## 2019-02-25 MED ORDER — BUPIVACAINE LIPOSOME 1.3 % IJ SUSP
INTRAMUSCULAR | Status: DC | PRN
  Administered 2019-02-25: 20 mL

## 2019-02-25 MED ORDER — FLEET ENEMA 7-19 GM/118ML RE ENEM: 1.0000 | ENEMA | Freq: Once | RECTAL | Status: DC | PRN

## 2019-02-25 MED ORDER — FENTANYL CITRATE (PF) 100 MCG/2ML IJ SOLN
50.0000 ug | INTRAMUSCULAR | Status: DC
  Administered 2019-02-25: 10:00:00 50 ug via INTRAVENOUS
  Filled 2019-02-25 (×2): qty 2

## 2019-02-25 MED ORDER — METOPROLOL TARTRATE 12.5 MG HALF TABLET
12.5000 mg | ORAL_TABLET | Freq: Two times a day (BID) | ORAL | Status: DC
  Administered 2019-02-25 – 2019-02-27 (×4): 12.5 mg via ORAL
  Filled 2019-02-25 (×5): qty 1

## 2019-02-25 SURGICAL SUPPLY — 63 items
BAG SPEC THK2 15X12 ZIP CLS (MISCELLANEOUS) ×1
BAG ZIPLOCK 12X15 (MISCELLANEOUS) ×3 IMPLANT
BASEPLATE TIBIAL LT SZ3 (Knees) ×2 IMPLANT
BLADE SAG 18X100X1.27 (BLADE) ×3 IMPLANT
BLADE SAW SGTL 11.0X1.19X90.0M (BLADE) ×3 IMPLANT
BLADE SURG SZ10 CARB STEEL (BLADE) ×6 IMPLANT
BNDG ELASTIC 6X10 VLCR STRL LF (GAUZE/BANDAGES/DRESSINGS) ×2 IMPLANT
BNDG ELASTIC 6X5.8 VLCR STR LF (GAUZE/BANDAGES/DRESSINGS) ×3 IMPLANT
BONE CEMENT GENTAMICIN (Cement) ×6 IMPLANT
BOWL SMART MIX CTS (DISPOSABLE) ×3 IMPLANT
BSPLAT TIB 3 CMNT M TPR KN LT (Knees) ×1 IMPLANT
CEMENT BONE GENTAMICIN 40 (Cement) IMPLANT
CLOSURE WOUND 1/2 X4 (GAUZE/BANDAGES/DRESSINGS) ×1
COVER SURGICAL LIGHT HANDLE (MISCELLANEOUS) ×3 IMPLANT
COVER WAND RF STERILE (DRAPES) IMPLANT
CUFF TOURN SGL QUICK 34 (TOURNIQUET CUFF) ×3
CUFF TRNQT CYL 34X4.125X (TOURNIQUET CUFF) ×1 IMPLANT
DECANTER SPIKE VIAL GLASS SM (MISCELLANEOUS) ×3 IMPLANT
DRAPE IMP U-DRAPE 54X76 (DRAPES) ×2 IMPLANT
DRAPE U-SHAPE 47X51 STRL (DRAPES) ×3 IMPLANT
DRSG ADAPTIC 3X8 NADH LF (GAUZE/BANDAGES/DRESSINGS) ×3 IMPLANT
DRSG EMULSION OIL 3X16 NADH (GAUZE/BANDAGES/DRESSINGS) ×2 IMPLANT
DRSG PAD ABDOMINAL 8X10 ST (GAUZE/BANDAGES/DRESSINGS) ×3 IMPLANT
DURAPREP 26ML APPLICATOR (WOUND CARE) ×3 IMPLANT
ELECT REM PT RETURN 15FT ADLT (MISCELLANEOUS) ×3 IMPLANT
EVACUATOR 1/8 PVC DRAIN (DRAIN) ×3 IMPLANT
FEM COMP 4 LEFT KNEE (Knees) ×2 IMPLANT
GAUZE SPONGE 4X4 12PLY STRL (GAUZE/BANDAGES/DRESSINGS) ×3 IMPLANT
GLOVE BIO SURGEON STRL SZ7 (GLOVE) ×3 IMPLANT
GLOVE BIO SURGEON STRL SZ8 (GLOVE) ×3 IMPLANT
GLOVE BIOGEL PI IND STRL 6.5 (GLOVE) ×1 IMPLANT
GLOVE BIOGEL PI IND STRL 7.0 (GLOVE) ×1 IMPLANT
GLOVE BIOGEL PI IND STRL 8 (GLOVE) ×1 IMPLANT
GLOVE BIOGEL PI INDICATOR 6.5 (GLOVE) ×2
GLOVE BIOGEL PI INDICATOR 7.0 (GLOVE) ×2
GLOVE BIOGEL PI INDICATOR 8 (GLOVE) ×2
GLOVE SURG SS PI 6.5 STRL IVOR (GLOVE) ×3 IMPLANT
GOWN STRL REUS W/TWL LRG LVL3 (GOWN DISPOSABLE) ×9 IMPLANT
HANDPIECE INTERPULSE COAX TIP (DISPOSABLE) ×3
HOLDER FOLEY CATH W/STRAP (MISCELLANEOUS) IMPLANT
IMMOBILIZER KNEE 20 (SOFTGOODS) ×6
IMMOBILIZER KNEE 20 THIGH 36 (SOFTGOODS) ×1 IMPLANT
INSERT TIB PS SZ3-4 12 (Miscellaneous) ×2 IMPLANT
KIT TURNOVER KIT A (KITS) ×2 IMPLANT
MANIFOLD NEPTUNE II (INSTRUMENTS) ×3 IMPLANT
NS IRRIG 1000ML POUR BTL (IV SOLUTION) ×3 IMPLANT
PACK TOTAL KNEE CUSTOM (KITS) ×3 IMPLANT
PADDING CAST COTTON 6X4 STRL (CAST SUPPLIES) ×5 IMPLANT
PATELLA RESURF GEN II 32MM (Orthopedic Implant) ×2 IMPLANT
PROTECTOR NERVE ULNAR (MISCELLANEOUS) ×3 IMPLANT
SET HNDPC FAN SPRY TIP SCT (DISPOSABLE) ×1 IMPLANT
STOCKINETTE 6  STRL (DRAPES) ×2
STOCKINETTE 6 STRL (DRAPES) IMPLANT
STRIP CLOSURE SKIN 1/2X4 (GAUZE/BANDAGES/DRESSINGS) ×3 IMPLANT
SUT MNCRL AB 4-0 PS2 18 (SUTURE) ×3 IMPLANT
SUT STRATAFIX 0 PDS 27 VIOLET (SUTURE) ×3
SUT VIC AB 2-0 CT1 27 (SUTURE) ×6
SUT VIC AB 2-0 CT1 TAPERPNT 27 (SUTURE) ×3 IMPLANT
SUTURE STRATFX 0 PDS 27 VIOLET (SUTURE) ×1 IMPLANT
TRAY FOLEY MTR SLVR 16FR STAT (SET/KITS/TRAYS/PACK) ×3 IMPLANT
WATER STERILE IRR 1000ML POUR (IV SOLUTION) ×6 IMPLANT
WRAP KNEE MAXI GEL POST OP (GAUZE/BANDAGES/DRESSINGS) ×3 IMPLANT
YANKAUER SUCT BULB TIP 10FT TU (MISCELLANEOUS) ×3 IMPLANT

## 2019-02-25 NOTE — Discharge Instructions (Signed)
° °Dr. Frank Aluisio °Total Joint Specialist °[facility] °[address] °[place], [place] [address] °[phone] ° °TOTAL KNEE REPLACEMENT POSTOPERATIVE DIRECTIONS ° °Knee Rehabilitation, Guidelines Following Surgery  °Results after knee surgery are often greatly improved when you follow the exercise, range of motion and muscle strengthening exercises prescribed by your doctor. Safety measures are also important to protect the knee from further injury. Any time any of these exercises cause you to have increased pain or swelling in your knee joint, decrease the amount until you are comfortable again and slowly increase them. If you have problems or questions, call your caregiver or physical therapist for advice.  ° °HOME CARE INSTRUCTIONS  °• Remove items at home which could result in a fall. This includes throw rugs or furniture in walking pathways.  °· ICE to the affected knee every three hours for 30 minutes at a time and then as needed for pain and swelling.  Continue to use ice on the knee for pain and swelling from surgery. You may notice swelling that will progress down to the foot and ankle.  This is normal after surgery.  Elevate the leg when you are not up walking on it.   °· Continue to use the breathing machine which will help keep your temperature down.  It is common for your temperature to cycle up and down following surgery, especially at night when you are not up moving around and exerting yourself.  The breathing machine keeps your lungs expanded and your temperature down. °· Do not place pillow under knee, focus on keeping the knee straight while resting ° °DIET °You may resume your previous home diet once your are discharged from the hospital. ° °DRESSING / WOUND CARE / SHOWERING °You may change your dressing 3-5 days after surgery.  Then change the dressing every day with sterile gauze.  Please use good hand washing techniques before changing the dressing.  Do not use any lotions  or creams on the incision until instructed by your surgeon. °You may start showering once you are discharged home but do not submerge the incision under water. Just pat the incision dry and apply a dry gauze dressing on daily. °Change the surgical dressing daily and reapply a dry dressing each time. ° °ACTIVITY °Walk with your walker as instructed. °Use walker as long as suggested by your caregivers. °Avoid periods of inactivity such as sitting longer than an hour when not asleep. This helps prevent blood clots.  °You may resume a sexual relationship in one month or when given the OK by your doctor.  °You may return to work once you are cleared by your doctor.  °Do not drive a car for 6 weeks or until released by you surgeon.  °Do not drive while taking narcotics. ° °WEIGHT BEARING °Weight bearing as tolerated with assist device (walker, cane, etc) as directed, use it as long as suggested by your surgeon or therapist, typically at least 4-6 weeks. ° °POSTOPERATIVE CONSTIPATION PROTOCOL °Constipation - defined medically as fewer than three stools per week and severe constipation as less than one stool per week. ° °One of the most common issues patients have following surgery is constipation.  Even if you have a regular bowel pattern at home, your normal regimen is likely to be disrupted due to multiple reasons following surgery.  Combination of anesthesia, postoperative narcotics, change in appetite and fluid intake all can affect your bowels.  In order to avoid complications following surgery, here are some   recommendations in order to help you during your recovery period. ° °Colace (docusate) - Pick up an over-the-counter form of Colace or another stool softener and take twice a day as long as you are requiring postoperative pain medications.  Take with a full glass of water daily.  If you experience loose stools or diarrhea, hold the colace until you stool forms back up.  If your symptoms do not get better within 1  week or if they get worse, check with your doctor. ° °Dulcolax (bisacodyl) - Pick up over-the-counter and take as directed by the product packaging as needed to assist with the movement of your bowels.  Take with a full glass of water.  Use this product as needed if not relieved by Colace only.  ° °MiraLax (polyethylene glycol) - Pick up over-the-counter to have on hand.  MiraLax is a solution that will increase the amount of water in your bowels to assist with bowel movements.  Take as directed and can mix with a glass of water, juice, soda, coffee, or tea.  Take if you go more than two days without a movement. °Do not use MiraLax more than once per day. Call your doctor if you are still constipated or irregular after using this medication for 7 days in a row. ° °If you continue to have problems with postoperative constipation, please contact the office for further assistance and recommendations.  If you experience "the worst abdominal pain ever" or develop nausea or vomiting, please contact the office immediatly for further recommendations for treatment. ° °ITCHING °If you experience itching with your medications, try taking only a single pain pill, or even half a pain pill at a time.  You can also use Benadryl over the counter for itching or also to help with sleep.  ° °TED HOSE STOCKINGS °Wear the elastic stockings on both legs for three weeks following surgery during the day but you may remove then at night for sleeping. ° °MEDICATIONS °See your medication summary on the “After Visit Summary” that the nursing staff will review with you prior to discharge.  You may have some home medications which will be placed on hold until you complete the course of blood thinner medication.  It is important for you to complete the blood thinner medication as prescribed by your surgeon.  Continue your approved medications as instructed at time of discharge. ° °PRECAUTIONS °If you experience chest pain or shortness of breath -  call 911 immediately for transfer to the hospital emergency department.  °If you develop a fever greater that 101 F, purulent drainage from wound, increased redness or drainage from wound, foul odor from the wound/dressing, or calf pain - CONTACT YOUR SURGEON.   °                                                °FOLLOW-UP APPOINTMENTS °Make sure you keep all of your appointments after your operation with your surgeon and caregivers. You should call the office at the above phone number and make an appointment for approximately two weeks after the date of your surgery or on the date instructed by your surgeon outlined in the "After Visit Summary". ° °RANGE OF MOTION AND STRENGTHENING EXERCISES  °Rehabilitation of the knee is important following a knee injury or an operation. After just a few days of immobilization, the muscles of   the thigh which control the knee become weakened and shrink (atrophy). Knee exercises are designed to build up the tone and strength of the thigh muscles and to improve knee motion. Often times heat used for twenty to thirty minutes before working out will loosen up your tissues and help with improving the range of motion but do not use heat for the first two weeks following surgery. These exercises can be done on a training (exercise) mat, on the floor, on a table or on a bed. Use what ever works the best and is most comfortable for you Knee exercises include:  °• Leg Lifts - While your knee is still immobilized in a splint or cast, you can do straight leg raises. Lift the leg to 60 degrees, hold for 3 sec, and slowly lower the leg. Repeat 10-20 times 2-3 times daily. Perform this exercise against resistance later as your knee gets better.  °• Quad and Hamstring Sets - Tighten up the muscle on the front of the thigh (Quad) and hold for 5-10 sec. Repeat this 10-20 times hourly. Hamstring sets are done by pushing the foot backward against an object and holding for 5-10 sec. Repeat as with quad  sets.  °· Leg Slides: Lying on your back, slowly slide your foot toward your buttocks, bending your knee up off the floor (only go as far as is comfortable). Then slowly slide your foot back down until your leg is flat on the floor again. °· Angel Wings: Lying on your back spread your legs to the side as far apart as you can without causing discomfort.  °A rehabilitation program following serious knee injuries can speed recovery and prevent re-injury in the future due to weakened muscles. Contact your doctor or a physical therapist for more information on knee rehabilitation.  ° °IF YOU ARE TRANSFERRED TO A SKILLED REHAB FACILITY °If the patient is transferred to a skilled rehab facility following release from the hospital, a list of the current medications will be sent to the facility for the patient to continue.  When discharged from the skilled rehab facility, please have the facility set up the patient's Home Health Physical Therapy prior to being released. Also, the skilled facility will be responsible for providing the patient with their medications at time of release from the facility to include their pain medication, the muscle relaxants, and their blood thinner medication. If the patient is still at the rehab facility at time of the two week follow up appointment, the skilled rehab facility will also need to assist the patient in arranging follow up appointment in our office and any transportation needs. ° °MAKE SURE YOU:  °• Understand these instructions.  °• Get help right away if you are not doing well or get worse.  ° ° °Pick up stool softner and laxative for home use following surgery while on pain medications. °Do not submerge incision under water. °Please use good hand washing techniques while changing dressing each day. °May shower starting three days after surgery. °Please use a clean towel to pat the incision dry following showers. °Continue to use ice for pain and swelling after surgery. °Do not  use any lotions or creams on the incision until instructed by your surgeon. ° °

## 2019-02-25 NOTE — Evaluation (Signed)
Physical Therapy Evaluation Patient Details Name: Meredith Moore MRN: [id number] DOB: [birth year] Today's Date: 02/25/2019   History of Present Illness  83 yo female s/p L TKR on 02/25/19. PMH includes L and R THA, R TKR, afib, blindness, osteopenia, HLD.  Clinical Impression  Pt presents with L knee pain, decreased L knee ROM, increased time and effort to perform mobility tasks, and decreased activity tolerance. Pt to benefit from acute PT to address deficits. Pt ambulated 50 ft with RW with min guard assist, verbal cuing for form and safety provided throughout. Pt educated on ankle pumps (20/hour) to perform this afternoon/evening to increase circulation, to pt's tolerance and limited by pain. PT to progress mobility as tolerated, and will continue to follow acutely.        Follow Up Recommendations Follow surgeon's recommendation for DC plan and follow-up therapies;Supervision for mobility/OOB(HHPT-->OPPT)    Equipment Recommendations  None recommended by PT    Recommendations for Other Services       Precautions / Restrictions Precautions Precautions: Fall Required Braces or Orthoses: Knee Immobilizer - Left Knee Immobilizer - Left: On when out of bed or walking;Discontinue once straight leg raise with < 10 degree lag Restrictions Weight Bearing Restrictions: No Other Position/Activity Restrictions: WBAT      Mobility  Bed Mobility Overal bed mobility: Needs Assistance Bed Mobility: Supine to Sit     Supine to sit: Min guard;HOB elevated     General bed mobility comments: Min guard for safety, verbal cuing for sequencing to EOB.  Transfers Overall transfer level: Needs assistance Equipment used: Rolling walker (2 wheeled) Transfers: Sit to/from Stand Sit to Stand: Min guard;From elevated surface         General transfer comment: Min guard for safety, verbal cuing for hand placement when rising.  Ambulation/Gait Ambulation/Gait assistance: Min guard Gait  Distance (Feet): 50 Feet Assistive device: Rolling walker (2 wheeled) Gait Pattern/deviations: Step-to pattern;Decreased step length - right;Decreased step length - left;Decreased weight shift to left;Trunk flexed Gait velocity: decr   General Gait Details: Min guard for safety, verbal cuing for sequencing, placement in RW, turning with RW.  Stairs            Wheelchair Mobility    Modified Rankin (Stroke Patients Only)       Balance                                             Pertinent Vitals/Pain Pain Assessment: 0-10 Pain Score: 4  Pain Location: L knee Pain Descriptors / Indicators: Sore Pain Intervention(s): Limited activity within patient's tolerance;Monitored during session;Repositioned;Premedicated before session;Ice applied    Home Living Family/patient expects to be discharged to:: Private residence Living Arrangements: Spouse/significant other Available Help at Discharge: Family;Available 24 hours/day Type of Home: House Home Access: Stairs to enter Entrance Stairs-Rails: Psychiatric nurse of Steps: 4 Home Layout: Two level;Bed/bath upstairs Home Equipment: Walker - 2 wheels;Cane - single point;Toilet riser      Prior Function Level of Independence: Independent with assistive device(s)         Comments: pt reports using cane for ambulation, and partially for sight. Pt has multiple audio-assist devices at home due to legal blindness.     Hand Dominance   Dominant Hand: Right    Extremity/Trunk Assessment   Upper Extremity Assessment Upper Extremity Assessment: Overall WFL for tasks assessed  Lower Extremity Assessment Lower Extremity Assessment: Overall WFL for tasks assessed;LLE deficits/detail LLE Deficits / Details: suspected post-operative weakness; able to perform ankle pumps, heel slides to 90*, quad set, SLR without lift assist or quad lag LLE Sensation: WNL    Cervical / Trunk  Assessment Cervical / Trunk Assessment: Normal  Communication   Communication: No difficulties  Cognition Arousal/Alertness: Awake/alert Behavior During Therapy: WFL for tasks assessed/performed Overall Cognitive Status: Within Functional Limits for tasks assessed                                        General Comments      Exercises     Assessment/Plan    PT Assessment Patient needs continued PT services  PT Problem List Decreased mobility;Decreased strength;Decreased range of motion;Decreased activity tolerance;Decreased balance;Decreased knowledge of use of DME;Pain       PT Treatment Interventions DME instruction;Therapeutic activities;Gait training;Therapeutic exercise;Patient/family education;Balance training;Stair training;Functional mobility training    PT Goals (Current goals can be found in the Care Plan section)  Acute Rehab PT Goals Patient Stated Goal: go home with husband PT Goal Formulation: With patient Time For Goal Achievement: 03/04/19 Potential to Achieve Goals: Good    Frequency 7X/week   Barriers to discharge        Co-evaluation               AM-PAC PT "6 Clicks" Mobility  Outcome Measure Help needed turning from your back to your side while in a flat bed without using bedrails?: A Little Help needed moving from lying on your back to sitting on the side of a flat bed without using bedrails?: A Little Help needed moving to and from a bed to a chair (including a wheelchair)?: A Little Help needed standing up from a chair using your arms (e.g., wheelchair or bedside chair)?: A Little Help needed to walk in hospital room?: A Little Help needed climbing 3-5 steps with a railing? : A Little 6 Click Score: 18    End of Session Equipment Utilized During Treatment: Gait belt;Left knee immobilizer Activity Tolerance: Patient tolerated treatment well Patient left: in chair;with chair alarm set;with call bell/phone within  reach;with SCD's reapplied Nurse Communication: Mobility status PT Visit Diagnosis: Other abnormalities of gait and mobility (R26.89);Difficulty in walking, not elsewhere classified (R26.2)    Time: 1836-1900 PT Time Calculation (min) (ACUTE ONLY): 24 min   Charges:   PT Evaluation $PT Eval Low Complexity: 1 Low PT Treatments $Gait Training: 8-22 mins        Julien Girt, PT Acute Rehabilitation Services Pager [phone]  Office [phone]   Roxine Caddy D Elonda Husky 02/25/2019, 7:28 PM

## 2019-02-25 NOTE — Transfer of Care (Signed)
Immediate Anesthesia Transfer of Care Note  Patient: Meredith Moore  Procedure(s) Performed: TOTAL KNEE ARTHROPLASTY (Left )  Patient Location: PACU  Anesthesia Type:Spinal  Level of Consciousness: awake, alert  and oriented  Airway & Oxygen Therapy: Patient Spontanous Breathing and Patient connected to face mask oxygen  Post-op Assessment: Report given to RN and Post -op Vital signs reviewed and stable  Post vital signs: Reviewed and stable  Last Vitals:  Vitals Value Taken Time  BP    Temp    Pulse    Resp    SpO2      Last Pain:  Vitals:   02/25/19 0955  TempSrc:   PainSc: 0-No pain      Patients Stated Pain Goal: 5 (69/22/30 0979)  Complications: No apparent anesthesia complications

## 2019-02-25 NOTE — Anesthesia Procedure Notes (Signed)
Anesthesia Regional Block: Adductor canal block   Pre-Anesthetic Checklist: ,, timeout performed, Correct Patient, Correct Site, Correct Laterality, Correct Procedure, Correct Position, site marked, Risks and benefits discussed,  Surgical consent,  Pre-op evaluation,  At surgeon's request and post-op pain management  Laterality: Left  Prep: chloraprep       Needles:  Injection technique: Single-shot  Needle Type: Echogenic Stimulator Needle     Needle Length: 9cm  Needle Gauge: 21   Needle insertion depth: 7 cm   Additional Needles:   Narrative:  Start time: 02/25/2019 9:55 AM End time: 02/25/2019 9:59 AM Injection made incrementally with aspirations every 5 mL.  Performed by: Personally  Anesthesiologist: Josephine Igo, MD  Additional Notes: Timeout performed. Patient sedated. Relevant anatomy ID'd using [place]. Incremental 2-47ml injection of LA with frequent aspiration. Patient tolerated procedure well.        Left Adductor Canal Block

## 2019-02-25 NOTE — Anesthesia Procedure Notes (Signed)
Date/Time: 02/25/2019 10:42 AM Performed by: Sharlette Dense, CRNA Oxygen Delivery Method: Simple face mask

## 2019-02-25 NOTE — Op Note (Signed)
OPERATIVE REPORT-TOTAL KNEE ARTHROPLASTY   Pre-operative diagnosis- Osteoarthritis  Left knee(s)  Post-operative diagnosis- Osteoarthritis Left knee(s)  Procedure-  Left  Total Knee Arthroplasty [facility] [employer] Legion knee)  Surgeon- Dione Plover. Ladine Kiper, MD  Assistant- Ardeen Jourdain, PA-C   Anesthesia-  Adductor canal block and spinal  EBL-50 mL   Drains Hemovac  Tourniquet time-  Total Tourniquet Time Documented: Thigh (Left) - 36 minutes Total: Thigh (Left) - 36 minutes     Complications- None  Condition-PACU - hemodynamically stable.   Brief Clinical Note  Meredith Moore is a 83 y.o. year old female with end stage OA of her left knee with progressively worsening pain and dysfunction. She has constant pain, with activity and at rest and significant functional deficits with difficulties even with ADLs. She has had extensive non-op management including analgesics, injections of cortisone and viscosupplements, and home exercise program, but remains in significant pain with significant dysfunction. Radiographs show bone on bone arthritis lateral and patellofemoral with valgus deformity.. She presents now for left Total Knee Arthroplasty.    Procedure in detail---   The patient is brought into the operating room and positioned supine on the operating table. After successful administration of  Adductor canal block and spinal,   a tourniquet is placed high on the  Left thigh(s) and the lower extremity is prepped and draped in the usual sterile fashion. Time out is performed by the operating team and then the  Left lower extremity is wrapped in Esmarch, knee flexed and the tourniquet inflated to 300 mmHg.       A midline incision is made with a ten blade through the subcutaneous tissue to the level of the extensor mechanism. A fresh blade is used to make a medial parapatellar arthrotomy. Soft tissue over the proximal medial tibia is subperiosteally elevated to the joint  line with a knife and into the semimembranosus bursa with a Cobb elevator. Soft tissue over the proximal lateral tibia is elevated with attention being paid to avoiding the patellar tendon on the tibial tubercle. The patella is everted, knee flexed 90 degrees and the ACL and PCL are removed. Findings are bone on bone lateral and patellofemoral with large global osteophytes        The drill is used to create a starting hole in the distal femur and the canal is thoroughly irrigated with sterile saline to remove the fatty contents. The 5 degree Left  valgus alignment guide is placed into the femoral canal and the distal femoral cutting block is pinned to remove 9 mm off the distal femur. Resection is made with an oscillating saw.      The tibia is subluxed forward and the menisci are removed. The extramedullary alignment guide is placed referencing proximally at the medial aspect of the tibial tubercle and distally along the second metatarsal axis and tibial crest. The block is pinned to remove 47mm off the more deficient lateral  side. Resection is made with an oscillating saw. Size 3is the most appropriate size for the tibia and the proximal tibia is prepared with the modular drill and keel punch for that size.      The femoral sizing guide is placed and size 4 is most appropriate. Rotation is marked off the epicondylar axis and confirmed by creating a rectangular flexion gap at 90 degrees. The size 4 cutting block is pinned in this rotation and the anterior, posterior and chamfer cuts are made with the oscillating saw. The intercondylar block  is then placed and that cut is made.      Trial size 3 tibial component, trial size 4 posterior stabilized femur and a 12  mm posterior stabilized fixed bearing insert trial is placed. Full extension is achieved with excellent varus/valgus and anterior/posterior balance throughout full range of motion. The patella is everted and thickness measured to be 22  mm. Free hand  resection is taken to 12 mm, a 32 template is placed, lug holes are drilled, trial patella is placed, and it tracks normally. Osteophytes are removed off the posterior femur with the trial in place. All trials are removed and the cut bone surfaces prepared with pulsatile lavage. Cement is mixed and once ready for implantation, the size 3 tibial implant, size  4 posterior stabilized femoral component, and the size 32 patella are cemented in place and the patella is held with the clamp. The trial insert is placed and the knee held in full extension. The Exparel (20 ml mixed with 60 ml saline) is injected into the extensor mechanism, posterior capsule, medial and lateral gutters and subcutaneous tissues.  All extruded cement is removed and once the cement is hard the permanent 12 mm posterior stabilized fixed bearing insert is placed into the tibial tray.      The wound is copiously irrigated with saline solution and the extensor mechanism closed over a hemovac drain with #1 V-loc suture. The tourniquet is released for a total tourniquet time of 36  minutes. Flexion against gravity is 140 degrees and the patella tracks normally. Subcutaneous tissue is closed with 2.0 vicryl and subcuticular with running 4.0 Monocryl. The incision is cleaned and dried and steri-strips and a bulky sterile dressing are applied. The limb is placed into a knee immobilizer and the patient is awakened and transported to recovery in stable condition.      Please note that a surgical assistant was a medical necessity for this procedure in order to perform it in a safe and expeditious manner. Surgical assistant was necessary to retract the ligaments and vital neurovascular structures to prevent injury to them and also necessary for proper positioning of the limb to allow for anatomic placement of the prosthesis.   Dione Plover Abbegayle Denault, MD    02/25/2019, 11:47 AM

## 2019-02-25 NOTE — Interval H&P Note (Signed)
History and Physical Interval Note:  02/25/2019 9:47 AM  Meredith Moore  has presented today for surgery, with the diagnosis of left knee osteoarthritis.  The various methods of treatment have been discussed with the patient and family. After consideration of risks, benefits and other options for treatment, the patient has consented to  Procedure(s) with comments: TOTAL KNEE ARTHROPLASTY (Left) - 27min as a surgical intervention.  The patient's history has been reviewed, patient examined, no change in status, stable for surgery.  I have reviewed the patient's chart and labs.  Questions were answered to the patient's satisfaction.     Pilar Plate Fannye Myer

## 2019-02-25 NOTE — Anesthesia Postprocedure Evaluation (Signed)
Anesthesia Post Note  Patient: Meredith Moore  Procedure(s) Performed: TOTAL KNEE ARTHROPLASTY (Left )     Patient location during evaluation: PACU Anesthesia Type: Spinal Level of consciousness: oriented and awake and alert Pain management: pain level controlled Vital Signs Assessment: post-procedure vital signs reviewed and stable Respiratory status: spontaneous breathing, respiratory function stable, patient connected to nasal cannula oxygen and nonlabored ventilation Cardiovascular status: blood pressure returned to baseline and stable Postop Assessment: no headache, no backache, no apparent nausea or vomiting, spinal receding and patient able to bend at knees Anesthetic complications: no    Last Vitals:  Vitals:   02/25/19 1345 02/25/19 1425  BP:  (!) 150/63  Pulse:  (!) 55  Resp:  14  Temp: 36.7 C 36.4 C  SpO2:  100%    Last Pain:  Vitals:   02/25/19 1425  TempSrc: Oral  PainSc:                  Marissia Blackham A.

## 2019-02-25 NOTE — Progress Notes (Signed)
AssistedDr. Foster with left, ultrasound guided, adductor canal block. Side rails up, monitors on throughout procedure. See vital signs in flow sheet. Tolerated Procedure well.  

## 2019-02-25 NOTE — Anesthesia Procedure Notes (Signed)
Spinal  Patient location during procedure: OR Start time: 02/25/2019 10:45 AM End time: 02/25/2019 10:49 AM Staffing Resident/CRNA: Sharlette Dense, CRNA Performed: resident/CRNA  Preanesthetic Checklist Completed: patient identified, site marked, surgical consent, pre-op evaluation, timeout performed, IV checked, risks and benefits discussed and monitors and equipment checked Spinal Block Patient position: sitting Prep: site prepped and draped and DuraPrep Patient monitoring: heart rate, continuous pulse ox and blood pressure Approach: midline Location: L3-4 Injection technique: single-shot Needle Needle type: Sprotte  Needle gauge: 24 G Needle length: 9 cm Additional Notes Kit expiration date 06/01/2019 and lot #[id number] Clear free flow CSF, negative heme, negative paresthesia Tolerated well and returned to supine position

## 2019-02-26 DIAGNOSIS — M1712 Unilateral primary osteoarthritis, left knee: Secondary | ICD-10-CM | POA: Diagnosis not present

## 2019-02-26 LAB — BASIC METABOLIC PANEL
Anion gap: 4 — ABNORMAL LOW (ref 5–15)
BUN: 19 mg/dL (ref 8–23)
CO2: 21 mmol/L — ABNORMAL LOW (ref 22–32)
Calcium: 8.1 mg/dL — ABNORMAL LOW (ref 8.9–10.3)
Chloride: 108 mmol/L (ref 98–111)
Creatinine, Ser: 0.89 mg/dL (ref 0.44–1.00)
GFR calc Af Amer: >60 mL/min (ref >60)
GFR calc non Af Amer: >60 mL/min (ref >60)
Glucose, Bld: 110 mg/dL — ABNORMAL HIGH (ref 70–99)
Potassium: 4.1 mmol/L (ref 3.5–5.1)
Sodium: 133 mmol/L — ABNORMAL LOW (ref 135–145)

## 2019-02-26 LAB — CBC
HCT: 27.9 % — ABNORMAL LOW (ref 36.0–46.0)
Hemoglobin: 8.6 g/dL — ABNORMAL LOW (ref 12.0–15.0)
MCH: 30.5 pg (ref 26.0–34.0)
MCHC: 30.8 g/dL (ref 30.0–36.0)
MCV: 98.9 fL (ref 80.0–100.0)
Platelets: 184 10*3/uL (ref 150–400)
RBC: 2.82 MIL/uL — ABNORMAL LOW (ref 3.87–5.11)
RDW: 12.8 % (ref 11.5–15.5)
WBC: 7.7 10*3/uL (ref 4.0–10.5)
nRBC: 0 % (ref 0.0–0.2)

## 2019-02-26 NOTE — Progress Notes (Signed)
Physical Therapy Treatment Patient Details Name: Meredith Moore MRN: [id number] DOB: 07-05-1936 Today's Date: 02/26/2019    History of Present Illness 83 yo female s/p L TKR on 02/25/19. PMH includes L and R THA, R TKR, afib, blindness, osteopenia, HLD.    PT Comments    POD # 1 pm session Assisted with amb a greater distance.  General Gait Details: Min guard for safety, verbal cuing for sequencing, placement in RW, turning with RW.  Assisted to bathroom.  General transfer comment: Min guard for safety, verbal cuing for hand placement when rising.  Assisted back to bed for CPM.   Pt plans to D/C to home tomorrow.    Follow Up Recommendations  Follow surgeon's recommendation for DC plan and follow-up therapies;Supervision for mobility/OOB     Equipment Recommendations  None recommended by PT    Recommendations for Other Services       Precautions / Restrictions Precautions Precautions: Fall Precaution Comments: did not use KI pt able SLR Restrictions Weight Bearing Restrictions: No Other Position/Activity Restrictions: WBAT    Mobility  Bed Mobility Overal bed mobility: Needs Assistance Bed Mobility: Sit to Supine     Supine to sit: Supervision;Min guard Sit to supine: Min guard;Min assist   General bed mobility comments: assisted back to bed for CPM  Transfers Overall transfer level: Needs assistance Equipment used: Rolling walker (2 wheeled) Transfers: Sit to/from [employer] Sit to Stand: Supervision;Min guard Stand pivot transfers: Min guard;Min assist       General transfer comment: Min guard for safety, verbal cuing for hand placement when rising.   Safety with turns  Ambulation/Gait Ambulation/Gait assistance: Supervision;Min guard Gait Distance (Feet): 84 Feet   Gait Pattern/deviations: Step-to pattern;Decreased step length - right;Decreased step length - left;Decreased weight shift to left;Trunk flexed Gait velocity: decr    General Gait Details: Min guard for safety, verbal cuing for sequencing, placement in RW, turning with RW.   Stairs             Wheelchair Mobility    Modified Rankin (Stroke Patients Only)       Balance                                            Cognition Arousal/Alertness: Awake/alert Behavior During Therapy: WFL for tasks assessed/performed Overall Cognitive Status: Within Functional Limits for tasks assessed                                 General Comments: pleasant and motivated      Exercises      General Comments        Pertinent Vitals/Pain Pain Assessment: 0-10 Pain Score: 3  Pain Location: L knee Pain Descriptors / Indicators: Sore;Operative site guarding Pain Intervention(s): Premedicated before session;Repositioned;Monitored during session;Ice applied    Home Living                      Prior Function            PT Goals (current goals can now be found in the care plan section) Progress towards PT goals: Progressing toward goals    Frequency    7X/week      PT Plan Current plan remains appropriate    Co-evaluation  AM-PAC PT "6 Clicks" Mobility   Outcome Measure  Help needed turning from your back to your side while in a flat bed without using bedrails?: A Little Help needed moving from lying on your back to sitting on the side of a flat bed without using bedrails?: A Little Help needed moving to and from a bed to a chair (including a wheelchair)?: A Little Help needed standing up from a chair using your arms (e.g., wheelchair or bedside chair)?: A Little Help needed to walk in hospital room?: A Little Help needed climbing 3-5 steps with a railing? : A Little 6 Click Score: 18    End of Session Equipment Utilized During Treatment: Gait belt Activity Tolerance: Patient tolerated treatment well Patient left: in chair;with chair alarm set;with call bell/phone within  reach;with SCD's reapplied Nurse Communication: Mobility status PT Visit Diagnosis: Other abnormalities of gait and mobility (R26.89);Difficulty in walking, not elsewhere classified (R26.2)     Time: 1425-1450 PT Time Calculation (min) (ACUTE ONLY): 25 min  Charges:  $Gait Training: 8-22 mins $Therapeutic Activity: 8-22 mins                     Rica Koyanagi  PTA Acute  Rehabilitation Services Pager      [phone] Office      [phone]

## 2019-02-26 NOTE — Plan of Care (Signed)
  Problem: Education: Goal: Knowledge of the prescribed therapeutic regimen will improve Outcome: Progressing   Problem: Activity: Goal: Range of joint motion will improve Outcome: Progressing   Problem: Pain Management: Goal: Pain level will decrease with appropriate interventions Outcome: Progressing   

## 2019-02-26 NOTE — Progress Notes (Signed)
Subjective: 1 Day Post-Op Procedure(s) (LRB): TOTAL KNEE ARTHROPLASTY (Left) Patient reports pain as moderate.   Patient seen in rounds by Dr. Wynelle Link. Patient is well, and has had no acute complaints or problems other than pain in the left knee. Did well ambulating with therapy yesterday, will continue working with them today. No issues overnight, denies chest pain, SOB, or calf pain. Foley catheter removed this AM.  Objective: Vital signs in last 24 hours: Temp:  [97.4 F (36.3 C)-98.5 F (36.9 C)] 97.9 F (36.6 C) (07/28 0511) Pulse Rate:  [34-76] 56 (07/28 0511) Resp:  [13-20] 16 (07/28 0511) BP: (106-154)/(49-76) 125/49 (07/28 0511) SpO2:  [98 %-100 %] 99 % (07/28 0511) Weight:  [59.9 kg] 59.9 kg (07/27 0839)  Intake/Output from previous day:  Intake/Output Summary (Last 24 hours) at 02/26/2019 0720 Last data filed at 02/26/2019 0610 Gross per 24 hour  Intake 2959.01 ml  Output 2610 ml  Net 349.01 ml    Labs: Recent Labs    02/26/19 0309  HGB 8.6*   Recent Labs    02/26/19 0309  WBC 7.7  RBC 2.82*  HCT 27.9*  PLT 184   Recent Labs    02/26/19 0309  NA 133*  K 4.1  CL 108  CO2 21*  BUN 19  CREATININE 0.89  GLUCOSE 110*  CALCIUM 8.1*   Exam: General - Patient is Alert and Oriented Extremity - Neurologically intact Neurovascular intact Sensation intact distally Dorsiflexion/Plantar flexion intact Dressing - dressing C/D/I Motor Function - intact, moving foot and toes well on exam.   Past Medical History:  Diagnosis Date  . Arthritis   . Blind 10/12/2016   Has learned Braille  . C. difficile colitis   . Complication of anesthesia    "Blood pressure has gone too low before and effected vision" just with general  . Complication of anesthesia    "woke up in severe panic attack" just with general  . Glaucoma   . Herpes zoster with unspecified complication   . Hip dysplasia    right side  . History of atrial fibrillation    RVR  . History  of sinus tachycardia   . History of transfusion 2014   2 units, no reaction-after knee replacement  . Hyperlipidemia    under control  . Iron deficiency anemia   . Macular degeneration   . Migraine    hx of   . Osteoarthrosis, unspecified whether generalized or localized, unspecified site   . Right hip pain    hx     Assessment/Plan: 1 Day Post-Op Procedure(s) (LRB): TOTAL KNEE ARTHROPLASTY (Left) Principal Problem:   OA (osteoarthritis) of knee Active Problems:   Osteoarthritis of left knee  Estimated body mass index is 23.41 kg/m as calculated from the following:   Height as of this encounter: 5\' 3"  (1.6 m).   Weight as of this encounter: 59.9 kg. Advance diet Up with therapy  Anticipated LOS equal to or greater than 2 midnights due to - Age 35 and older with one or more of the following:  - Obesity  - Expected need for hospital services (PT, OT, Nursing) required for safe  discharge  - Anticipated need for postoperative skilled nursing care or inpatient rehab  - Active co-morbidities: None OR   - Unanticipated findings during/Post Surgery: None  - Patient is a high risk of re-admission due to: None    DVT Prophylaxis - Aspirin Weight bearing as tolerated. D/C O2 and pulse ox and  try on room air. Hemovac pulled without difficulty, will continue therapy today.  Patient adamantly refused intra and postoperative antibiotic regimen due to history of C. diff infection. Risks discussed with patient and she vocalized understanding.   Hemoglobin dropped from 11.0 preoperatively to 8.6. Will continue to monitor with repeat CBC in AM.  Plan is to go Home after hospital stay. Plan for discharge tomorrow pending progress with physical therapy.  Theresa Duty, PA-C Orthopedic Surgery 02/26/2019, 7:20 AM

## 2019-02-26 NOTE — Progress Notes (Signed)
Physical Therapy Treatment Patient Details Name: Meredith Moore MRN: [id number] DOB: [birth year] Today's Date: 02/26/2019    History of Present Illness 83 yo female s/p L TKR on 02/25/19. PMH includes L and R THA, R TKR, afib, blindness, osteopenia, HLD.    PT Comments    POD # 1 am session Assisted OOB to amb.  Demonstrated and instructed pt how to use belt to self assist LE as a leg lifter General bed mobility comments: Min guard for safety, verbal cuing for sequencing to EOB. General transfer comment: Min guard for safety, verbal cuing for hand placement when rising.   Safety with turns.  General Gait Details: Min guard for safety, verbal cuing for sequencing, placement in RW, turning with RW. Then returned to room to perform some TE's following HEP handout.  Instructed on proper tech, freq as well as use of ICE.     Follow Up Recommendations  Follow surgeon's recommendation for DC plan and follow-up therapies;Supervision for mobility/OOB     Equipment Recommendations  None recommended by PT    Recommendations for Other Services       Precautions / Restrictions Precautions Precautions: Fall Precaution Comments: did not use KI pt able SLR Restrictions Weight Bearing Restrictions: No Other Position/Activity Restrictions: WBAT    Mobility  Bed Mobility Overal bed mobility: Needs Assistance Bed Mobility: Supine to Sit     Supine to sit: Supervision;Min guard     General bed mobility comments: Min guard for safety, verbal cuing for sequencing to EOB.  Transfers Overall transfer level: Needs assistance Equipment used: Rolling walker (2 wheeled) Transfers: Sit to/from [employer] Sit to Stand: Supervision;Min guard Stand pivot transfers: Min guard;Min assist       General transfer comment: Min guard for safety, verbal cuing for hand placement when rising.   Safety with turns  Ambulation/Gait Ambulation/Gait assistance: Supervision;Min guard Gait  Distance (Feet): 75 Feet   Gait Pattern/deviations: Step-to pattern;Decreased step length - right;Decreased step length - left;Decreased weight shift to left;Trunk flexed Gait velocity: decr   General Gait Details: Min guard for safety, verbal cuing for sequencing, placement in RW, turning with RW.   Stairs             Wheelchair Mobility    Modified Rankin (Stroke Patients Only)       Balance                                            Cognition Arousal/Alertness: Awake/alert Behavior During Therapy: WFL for tasks assessed/performed Overall Cognitive Status: Within Functional Limits for tasks assessed                                 General Comments: pleasant and motivated      Exercises   Total Knee Replacement TE's 10 reps B LE ankle pumps 10 reps towel squeezes 10 reps knee presses 10 reps heel slides  10 reps SAQ's 10 reps SLR's 10 reps ABD Followed by ICE     General Comments        Pertinent Vitals/Pain Pain Assessment: 0-10 Pain Score: 3  Pain Location: L knee Pain Descriptors / Indicators: Sore;Operative site guarding Pain Intervention(s): Premedicated before session;Repositioned;Monitored during session;Ice applied    Home Living  Prior Function            PT Goals (current goals can now be found in the care plan section) Progress towards PT goals: Progressing toward goals    Frequency    7X/week      PT Plan Current plan remains appropriate    Co-evaluation              AM-PAC PT "6 Clicks" Mobility   Outcome Measure  Help needed turning from your back to your side while in a flat bed without using bedrails?: A Little Help needed moving from lying on your back to sitting on the side of a flat bed without using bedrails?: A Little Help needed moving to and from a bed to a chair (including a wheelchair)?: A Little Help needed standing up from a chair using  your arms (e.g., wheelchair or bedside chair)?: A Little Help needed to walk in hospital room?: A Little Help needed climbing 3-5 steps with a railing? : A Little 6 Click Score: 18    End of Session Equipment Utilized During Treatment: Gait belt Activity Tolerance: Patient tolerated treatment well Patient left: in chair;with chair alarm set;with call bell/phone within reach;with SCD's reapplied Nurse Communication: Mobility status PT Visit Diagnosis: Other abnormalities of gait and mobility (R26.89);Difficulty in walking, not elsewhere classified (R26.2)     Time: 7482-7078 PT Time Calculation (min) (ACUTE ONLY): 25 min  Charges:  $Gait Training: 8-22 mins $Therapeutic Exercise: 8-22 mins                     Rica Koyanagi  PTA Acute  Rehabilitation Services Pager      [phone] Office      [phone]

## 2019-02-27 ENCOUNTER — Encounter (HOSPITAL_COMMUNITY): Payer: Self-pay | Admitting: Orthopedic Surgery

## 2019-02-27 DIAGNOSIS — M1712 Unilateral primary osteoarthritis, left knee: Secondary | ICD-10-CM | POA: Diagnosis not present

## 2019-02-27 LAB — BASIC METABOLIC PANEL
Anion gap: 8 (ref 5–15)
BUN: 18 mg/dL (ref 8–23)
CO2: 20 mmol/L — ABNORMAL LOW (ref 22–32)
Calcium: 8.2 mg/dL — ABNORMAL LOW (ref 8.9–10.3)
Chloride: 108 mmol/L (ref 98–111)
Creatinine, Ser: 0.89 mg/dL (ref 0.44–1.00)
GFR calc Af Amer: >60 mL/min (ref >60)
GFR calc non Af Amer: >60 mL/min (ref >60)
Glucose, Bld: 111 mg/dL — ABNORMAL HIGH (ref 70–99)
Potassium: 4.1 mmol/L (ref 3.5–5.1)
Sodium: 136 mmol/L (ref 135–145)

## 2019-02-27 LAB — CBC
HCT: 28 % — ABNORMAL LOW (ref 36.0–46.0)
Hemoglobin: 8.5 g/dL — ABNORMAL LOW (ref 12.0–15.0)
MCH: 30.1 pg (ref 26.0–34.0)
MCHC: 30.4 g/dL (ref 30.0–36.0)
MCV: 99.3 fL (ref 80.0–100.0)
Platelets: 186 10*3/uL (ref 150–400)
RBC: 2.82 MIL/uL — ABNORMAL LOW (ref 3.87–5.11)
RDW: 13.2 % (ref 11.5–15.5)
WBC: 8.6 10*3/uL (ref 4.0–10.5)
nRBC: 0 % (ref 0.0–0.2)

## 2019-02-27 MED ORDER — ASPIRIN 325 MG PO TBEC: 325.0000 mg | DELAYED_RELEASE_TABLET | Freq: Two times a day (BID) | ORAL | 0 refills | Status: AC

## 2019-02-27 MED ORDER — OXYCODONE HCL 5 MG PO TABS: 5.0000 mg | ORAL_TABLET | Freq: Four times a day (QID) | ORAL | 0 refills | Status: DC | PRN

## 2019-02-27 MED ORDER — METHOCARBAMOL 500 MG PO TABS: 500.0000 mg | ORAL_TABLET | Freq: Four times a day (QID) | ORAL | 0 refills | Status: DC | PRN

## 2019-02-27 NOTE — Progress Notes (Signed)
Physical Therapy Treatment Patient Details Name: Meredith Moore MRN: [id number] DOB: [birth year] Today's Date: 02/27/2019    History of Present Illness 83 yo female s/p L TKR on 02/25/19. PMH includes L and R THA, R TKR, afib, blindness, osteopenia, HLD.    PT Comments    POD # 2 Assisted OOB to bathroom, then amb in hallway to practice stairs.  Then returned to room to perform some TE's following HEP handout.  Instructed on proper tech, freq as well as use of ICE.   Addressed all mobility questions, discussed appropriate activity, educated on use of ICE.  Pt ready for D/C to home.   Follow Up Recommendations  Follow surgeon's recommendation for DC plan and follow-up therapies;Supervision for mobility/OOB     Equipment Recommendations  None recommended by PT    Recommendations for Other Services       Precautions / Restrictions Precautions Precautions: Fall Precaution Comments: did not use KI pt able SLR Restrictions Weight Bearing Restrictions: No Other Position/Activity Restrictions: WBAT    Mobility  Bed Mobility Overal bed mobility: Modified Independent             General bed mobility comments: self able  Transfers Overall transfer level: Needs assistance Equipment used: Rolling walker (2 wheeled) Transfers: Sit to/from [employer] Transfers Sit to Stand: Supervision;Modified independent (Device/Increase time) Stand pivot transfers: Supervision       General transfer comment: good safety cognition and use of hands  Ambulation/Gait Ambulation/Gait assistance: Supervision;Min guard Gait Distance (Feet): 95 Feet Assistive device: Rolling walker (2 wheeled) Gait Pattern/deviations: Step-to pattern;Decreased step length - right;Decreased step length - left;Decreased weight shift to left;Trunk flexed     General Gait Details: tolerated an increased distance   Stairs Stairs: Yes Stairs assistance: Min guard Stair Management: One rail  Right;Forwards;With cane Number of Stairs: 2 General stair comments: performed well.  Pt likes to use her the right rail and cane on left to scan steps due to low vision   Wheelchair Mobility    Modified Rankin (Stroke Patients Only)       Balance                                            Cognition Arousal/Alertness: Awake/alert Behavior During Therapy: WFL for tasks assessed/performed Overall Cognitive Status: Within Functional Limits for tasks assessed                                 General Comments: pleasant and motivated      Exercises   Total Knee Replacement TE's 10 reps B LE ankle pumps 10 reps towel squeezes 10 reps knee presses 10 reps heel slides  10 reps SAQ's 10 reps SLR's 10 reps ABD Followed by ICE     General Comments        Pertinent Vitals/Pain Pain Assessment: 0-10 Pain Score: 4  Pain Location: L knee Pain Descriptors / Indicators: Sore;Operative site guarding Pain Intervention(s): Monitored during session;Premedicated before session;Repositioned;Ice applied    Home Living                      Prior Function            PT Goals (current goals can now be found in the care plan section) Progress towards PT goals: Progressing  toward goals    Frequency    7X/week      PT Plan Current plan remains appropriate    Co-evaluation              AM-PAC PT "6 Clicks" Mobility   Outcome Measure  Help needed turning from your back to your side while in a flat bed without using bedrails?: A Little Help needed moving from lying on your back to sitting on the side of a flat bed without using bedrails?: A Little Help needed moving to and from a bed to a chair (including a wheelchair)?: A Little Help needed standing up from a chair using your arms (e.g., wheelchair or bedside chair)?: A Little Help needed to walk in hospital room?: A Little Help needed climbing 3-5 steps with a railing? : A  Little 6 Click Score: 18    End of Session Equipment Utilized During Treatment: Gait belt Activity Tolerance: Patient tolerated treatment well Patient left: in chair;with chair alarm set;with call bell/phone within reach;with SCD's reapplied Nurse Communication: Mobility status(ready for D/C) PT Visit Diagnosis: Other abnormalities of gait and mobility (R26.89);Difficulty in walking, not elsewhere classified (R26.2)     Time: 0935-1000 PT Time Calculation (min) (ACUTE ONLY): 25 min  Charges:  $Gait Training: 8-22 mins $Therapeutic Exercise: 8-22 mins                     Rica Koyanagi  PTA Acute  Rehabilitation Services Pager      [phone] Office      [phone]

## 2019-02-27 NOTE — TOC Progression Note (Signed)
Transition of Care [facility]) - Progression Note    Patient Details  Name: Meredith Moore MRN: [id number] Date of Birth: Apr 30, 1936  [facility]) CM/SW Contact  Leeroy Cha, RN Phone Number: 02/27/2019, 10:07 AM  Clinical Narrative:    dcd to home with hhc through [place] and equip   Expected Discharge Plan: [place] Barriers to Discharge: No Barriers Identified  Expected Discharge Plan and Services Expected Discharge Plan: [place]   Discharge Planning Services: CM Consult Post Acute Care Choice: Durable Medical Equipment, Home Health Living arrangements for the past 2 months: Single Family Home Expected Discharge Date: 02/27/19               DME Arranged: Gilford Rile rolling, 3-N-1 DME Agency: [employer] Date DME Agency Contacted: 02/27/19 Time DME Agency Contacted: 0900 Representative spoke with at DME Agency: nathan HH Arranged: PT [place]: [facility] [employer]) Date [place]: 02/27/19 Time [place]: 0900 Representative spoke with at [place]: [place] ([place]) Interventions    Readmission Risk Interventions No flowsheet data found.

## 2019-02-27 NOTE — Progress Notes (Signed)
   Subjective: 2 Days Post-Op Procedure(s) (LRB): TOTAL KNEE ARTHROPLASTY (Left) Patient reports pain as mild.   Patient seen in rounds by Dr. Wynelle Link. Patient is well, and has had no acute complaints or problems. States she is ready to go home. Denies chest pain, SOB, or calf pain. Voiding without difficulty and positive flatus. No issues overnight.  Plan is to go Home after hospital stay.  Objective: Vital signs in last 24 hours: Temp:  [97.6 F (36.4 C)-98.3 F (36.8 C)] 98.3 F (36.8 C) (07/29 0526) Pulse Rate:  [52-59] 54 (07/29 0526) Resp:  [16] 16 (07/29 0526) BP: (100-121)/(55-67) 121/67 (07/29 0526) SpO2:  [97 %-100 %] 100 % (07/29 0526)  Intake/Output from previous day:  Intake/Output Summary (Last 24 hours) at 02/27/2019 0702 Last data filed at 02/27/2019 0600 Gross per 24 hour  Intake 1359.37 ml  Output 1400 ml  Net -40.63 ml    Labs: Recent Labs    02/26/19 0309 02/27/19 0318  HGB 8.6* 8.5*   Recent Labs    02/26/19 0309 02/27/19 0318  WBC 7.7 8.6  RBC 2.82* 2.82*  HCT 27.9* 28.0*  PLT 184 186   Recent Labs    02/26/19 0309 02/27/19 0318  NA 133* 136  K 4.1 4.1  CL 108 108  CO2 21* 20*  BUN 19 18  CREATININE 0.89 0.89  GLUCOSE 110* 111*  CALCIUM 8.1* 8.2*   Exam: General - Patient is Alert and Oriented Extremity - Neurologically intact Neurovascular intact Sensation intact distally Dorsiflexion/Plantar flexion intact Dressing/Incision - clean, dry, no drainage Motor Function - intact, moving foot and toes well on exam.   Past Medical History:  Diagnosis Date  . Arthritis   . Blind 10/12/2016   Has learned Braille  . C. difficile colitis   . Complication of anesthesia    "Blood pressure has gone too low before and effected vision" just with general  . Complication of anesthesia    "woke up in severe panic attack" just with general  . Glaucoma   . Herpes zoster with unspecified complication   . Hip dysplasia    right side  .  History of atrial fibrillation    RVR  . History of sinus tachycardia   . History of transfusion 2014   2 units, no reaction-after knee replacement  . Hyperlipidemia    under control  . Iron deficiency anemia   . Macular degeneration   . Migraine    hx of   . Osteoarthrosis, unspecified whether generalized or localized, unspecified site   . Right hip pain    hx     Assessment/Plan: 2 Days Post-Op Procedure(s) (LRB): TOTAL KNEE ARTHROPLASTY (Left) Principal Problem:   OA (osteoarthritis) of knee Active Problems:   Osteoarthritis of left knee  Estimated body mass index is 23.41 kg/m as calculated from the following:   Height as of this encounter: 5\' 3"  (1.6 m).   Weight as of this encounter: 59.9 kg. Up with therapy D/C IV fluids  DVT Prophylaxis - Aspirin Weight-bearing as tolerated  Hemoglobin stable at 8.5. Takes ferrous sulfate at home.  Plan for discharge today once meeting goals. Scheduled for outpatient physical therapy at [facility]. Follow-up in the office in 2 weeks.   Theresa Duty, PA-C Orthopedic Surgery 02/27/2019, 7:02 AM

## 2019-03-26 ENCOUNTER — Other Ambulatory Visit: Payer: Self-pay | Admitting: Cardiology

## 2019-08-12 ENCOUNTER — Other Ambulatory Visit: Payer: Medicare Other

## 2019-09-25 ENCOUNTER — Ambulatory Visit: Payer: Medicare Other

## 2019-10-05 IMAGING — DX DG PORTABLE PELVIS
1 series · 1 of 1 positions shown · non-contrast
Comparison: Radiograph dated 06/04/2014

CLINICAL DATA: Osteoarthritis of the left hip.

EXAM:
PORTABLE PELVIS 1-2 VIEWS

[pelvis ap]
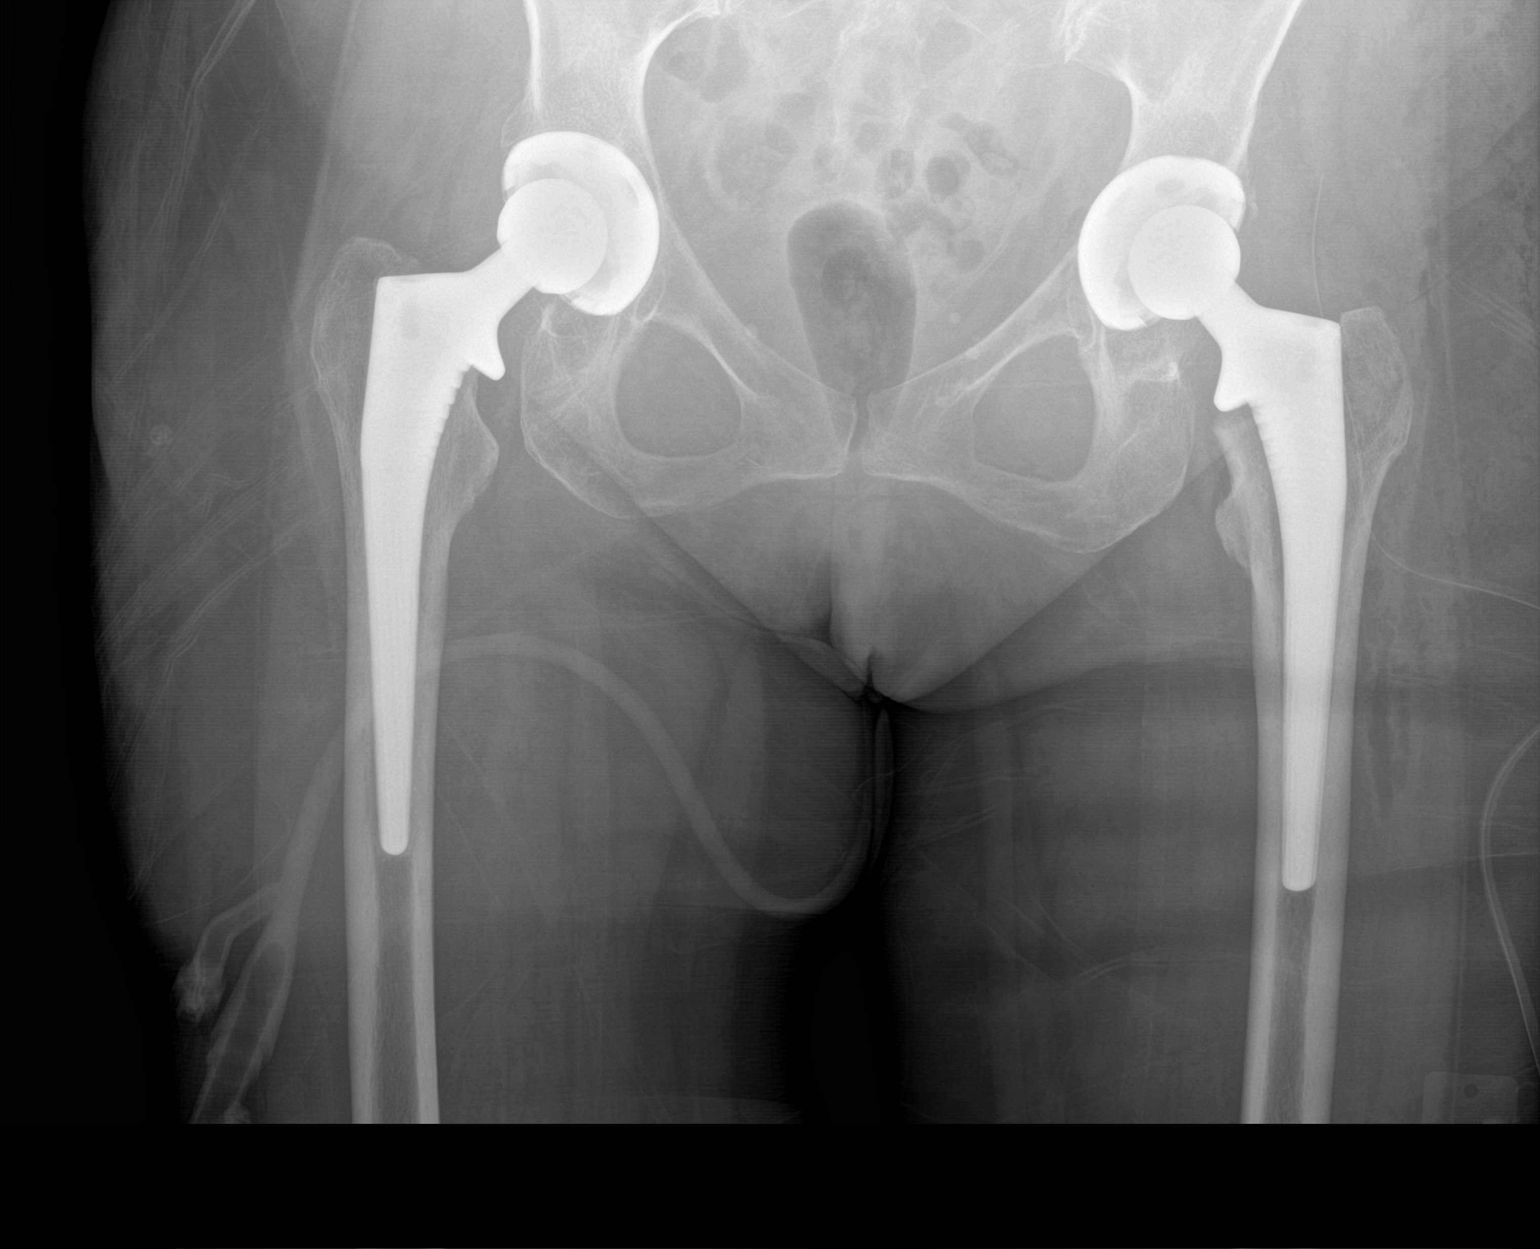

[1 of 1 positions shown; findings below may reference images not displayed]

FINDINGS: The components of the left total hip prosthesis appear in excellent
position in the AP projection. Soft tissue drain in place. Slight
protrusio. No visible fractures.

Right total hip prosthesis appears in good position as well.
IMPRESSION: Satisfactory appearance of the left hip in the AP projection after
total hip prosthesis insertion.

## 2020-02-26 ENCOUNTER — Ambulatory Visit: Payer: Medicare Other | Admitting: Cardiology

## 2020-02-26 ENCOUNTER — Encounter: Payer: Self-pay | Admitting: Cardiology

## 2020-02-26 ENCOUNTER — Other Ambulatory Visit: Payer: Self-pay

## 2020-02-26 VITALS — BP 130/80 | HR 62 | Ht 63.0 in | Wt 130.4 lb

## 2020-02-26 DIAGNOSIS — I480 Paroxysmal atrial fibrillation: Secondary | ICD-10-CM | POA: Diagnosis not present

## 2020-02-26 DIAGNOSIS — I471 Supraventricular tachycardia: Secondary | ICD-10-CM

## 2020-02-26 DIAGNOSIS — I2729 Other secondary pulmonary hypertension: Secondary | ICD-10-CM | POA: Diagnosis not present

## 2020-02-26 NOTE — Patient Instructions (Signed)
Medication Instructions:  The current medical regimen is effective;  continue present plan and medications.  *If you need a refill on your cardiac medications before your next appointment, please call your pharmacy*  Follow-Up: At [facility], you and your health needs are our priority.  As part of our continuing mission to provide you with exceptional heart care, we have created designated Provider Care Teams.  These Care Teams include your primary Cardiologist (physician) and Advanced Practice Providers (APPs -  Physician Assistants and Nurse Practitioners) who all work together to provide you with the care you need, when you need it.  We recommend signing up for the patient portal called "MyChart".  Sign up information is provided on this After Visit Summary.  MyChart is used to connect with patients for Virtual Visits (Telemedicine).  Patients are able to view lab/test results, encounter notes, upcoming appointments, etc.  Non-urgent messages can be sent to your provider as well.   To learn more about what you can do with MyChart, go to [email or web address].    Your next appointment:   12 month(s)  The format for your next appointment:   In Person  Provider:   Mark Skains, MD   Thank you for choosing [facility]!!      

## 2020-02-26 NOTE — Progress Notes (Signed)
Cardiology Office Note:    Date:  02/26/2020   ID:  Meredith Moore, DOB 1935-09-13, MRN [id number]  PCP:  Kristen Loader, FNP  Cardiologist:  Candee Furbish, MD  Electrophysiologist:  None   Referring MD: Kristen Loader, FNP     History of Present Illness:    Meredith Moore is a 84 y.o. female here for follow-up of tachycardia.    Prior office note: recent atrial tachycardia during the hospital 07/03/2018 with troponin was 0.31 this increased to 0.68.  Likely demand ischemia in the setting of A. fib RVR.  Beta-blocker was added.  Echocardiogram showed normal EF.  Throughout her hospitalization, C. difficile colitis was diagnosed and she was taking vancomycin.  She is feeling much better.  She has not had any chest pain shortness of breath syncope.  She denies any radiation history.  No prior thrombosis.  She is not very active because of her eyesight and knee and hip replacement but she does not have any difficulty with movement.  No shortness of breath in other words.  Her pulmonary pressures were moderately elevated on echocardiogram.  Other than mild pulmonary vascular congestion, nothing on chest x-ray.  Her husband here with her today.  02/26/2020-here for the follow-up of atrial tachycardia, had knee replacement 02/25/2019.  Prior monitor with no evidence of atrial fibrillation.  C. difficile in 2019.  Currently feels well.  No racing heart rates.  Her blindness has worsened but she has an Programmer, systems.  Lots of tools around the house to help her.  She also has a talking jigsaw puzzle of the [place].  Past Medical History:  Diagnosis Date   Arthritis    Blind 10/12/2016   Has learned Braille   C. difficile colitis    Complication of anesthesia    "Blood pressure has gone too low before and effected vision" just with general   Complication of anesthesia    "woke up in severe panic attack" just with general   Glaucoma    Herpes zoster with  unspecified complication    Hip dysplasia    right side   History of atrial fibrillation    RVR   History of sinus tachycardia    History of transfusion 2014   2 units, no reaction-after knee replacement   Hyperlipidemia    under control   Iron deficiency anemia    Macular degeneration    Migraine    hx of    Osteoarthrosis, unspecified whether generalized or localized, unspecified site    Right hip pain    hx     Past Surgical History:  Procedure Laterality Date   APPENDECTOMY  1979   BELPHAROPTOSIS REPAIR     eye lid drooping repair   COLONOSCOPY     ovarian tumor   1979   benign tumor removed    REVERSE SHOULDER ARTHROPLASTY Left 12/21/2017   Procedure: LEFT REVERSE SHOULDER ARTHROPLASTY;  Surgeon: Justice Britain, MD;  Location: [facility];  Service: Orthopedics;  Laterality: Left;   ROBOTIC ASSISTED TOTAL HYSTERECTOMY WITH BILATERAL SALPINGO OOPHERECTOMY Bilateral 03/25/2014   Procedure: ROBOTIC ASSISTED TOTAL HYSTERECTOMY WITH BILATERAL SALPINGO OOPHORECTOMY;  Surgeon: Everitt Amber, MD;  Location: [facility];  Service: Gynecology;  Laterality: Bilateral;   TONSILLECTOMY     1945    TOTAL HIP ARTHROPLASTY Right 06/04/2014   Procedure: RIGHT TOTAL HIP ARTHROPLASTY ANTERIOR APPROACH;  Surgeon: Gearlean Alf, MD;  Location: [facility];  Service: Orthopedics;  Laterality: Right;  TOTAL HIP ARTHROPLASTY Left 05/10/2017   Procedure: LEFT TOTAL HIP ARTHROPLASTY ANTERIOR APPROACH;  Surgeon: Gaynelle Arabian, MD;  Location: [facility];  Service: Orthopedics;  Laterality: Left;  90 mins   TOTAL KNEE ARTHROPLASTY Right 09/10/2012   Procedure: RIGHT TOTAL KNEE ARTHROPLASTY;  Surgeon: Gearlean Alf, MD;  Location: [facility];  Service: Orthopedics;  Laterality: Right;   TOTAL KNEE ARTHROPLASTY Left 02/25/2019   Procedure: TOTAL KNEE ARTHROPLASTY;  Surgeon: Gaynelle Arabian, MD;  Location: [facility];  Service: Orthopedics;  Laterality: Left;  5min    Current Medications: Current Meds   Medication Sig   acetaminophen (TYLENOL) 650 MG CR tablet Take 1,300 mg by mouth every 8 (eight) hours as needed for pain.   atorvastatin (LIPITOR) 10 MG tablet Take 5 mg by mouth at bedtime.    Calcium Carb-Cholecalciferol (CALCIUM+D3 PO) Take 1 tablet by mouth daily.   Cholecalciferol (VITAMIN D3) 50 MCG (2000 UT) TABS Take 2,000 Units by mouth daily.    ferrous sulfate 324 MG TBEC Take 324 mg by mouth daily with breakfast.   latanoprost (XALATAN) 0.005 % ophthalmic solution Place 1 drop into both eyes at bedtime.    meclizine (ANTIVERT) 12.5 MG tablet Take one tablet by mouth up to four times daily as needed for vertigo   methocarbamol (ROBAXIN) 500 MG tablet Take 1 tablet (500 mg total) by mouth every 6 (six) hours as needed for muscle spasms.   metoprolol tartrate (LOPRESSOR) 25 MG tablet Take 1/2 (one-half) tablet by mouth twice daily   Multiple Vitamins-Minerals (PRESERVISION AREDS 2+MULTI VIT) CAPS Take 1 tablet by mouth 2 (two) times daily.   oxyCODONE (OXY IR/ROXICODONE) 5 MG immediate release tablet Take 1-2 tablets (5-10 mg total) by mouth every 6 (six) hours as needed.   QUEtiapine (SEROQUEL) 25 MG tablet Take 12.5 mg by mouth at bedtime.   SIMBRINZA 1-0.2 % SUSP Place 1 drop into both eyes 3 (three) times daily with meals.    timolol (TIMOPTIC) 0.5 % ophthalmic solution Place 1 drop into both eyes 2 (two) times daily.      Allergies:   Temazepam, Tramadol, Formaldehyde, and Nickel   Social History   Socioeconomic History   Marital status: Married    Spouse name: Not on file   Number of children: Not on file   Years of education: Not on file   Highest education level: Not on file  Occupational History   Not on file  Tobacco Use   Smoking status: Former Smoker    Packs/day: 0.25    Years: 15.00    Pack years: 3.75    Quit date: 08/01/1988    Years since quitting: 31.5   Smokeless tobacco: Never Used  Scientific laboratory technician Use: Never used   Substance and Sexual Activity   Alcohol use: No   Drug use: No   Sexual activity: Yes    Birth control/protection: Surgical  Other Topics Concern   Not on file  Social History Narrative   Married   Former smoker 1990   Alcohol none   Exercise walk daily about one mile, gym   Social Determinants of Health   Financial Resource Strain:    Difficulty of Paying Living Expenses:   Food Insecurity:    Worried About Charity fundraiser in the Last Year:    Arboriculturist in the Last Year:   Transportation Needs:    Film/video editor (Medical):    Lack of Transportation (Non-Medical):  Physical Activity:    Days of Exercise per Week:    Minutes of Exercise per Session:   Stress:    Feeling of Stress :   Social Connections:    Frequency of Communication with Friends and Family:    Frequency of Social Gatherings with Friends and Family:    Attends Religious Services:    Active Member of Clubs or Organizations:    Attends Archivist Meetings:    Marital Status:      Family History: The patient's family history includes Heart disease in her father and mother.  ROS:   Please see the history of present illness.    Weakness improving all other systems reviewed and are negative.  EKGs/Labs/Other Studies Reviewed:    The following studies were reviewed today: Hospital records echocardiogram EKG lab work  ECHO 07/04/18: - Left ventricle: The cavity size was normal. Systolic function was   normal. The estimated ejection fraction was in the range of 55%   to 60%. Wall motion was normal; there were no regional wall   motion abnormalities. Features are consistent with a pseudonormal   left ventricular filling pattern, with concomitant abnormal   relaxation and increased filling pressure (grade 2 diastolic   dysfunction). - Aortic valve: There was trivial regurgitation. - Left atrium: The atrium was mildly dilated. - Right atrium: The atrium was  moderately dilated. - Atrial septum: No defect or patent foramen ovale was identified. - Tricuspid valve: There was mild-moderate regurgitation. - Pulmonary arteries: Systolic pressure was moderately increased.   PA peak pressure: 45 mm Hg (S).  Impressions:  - Normal LV EF, grade 2 diastolic dysfunction, elevated PA systolic   pressure. Mild-moderate TR.  Event monitor 01/07/2019:  Sinus rhythm predominant. No atrial fibrillation identified.   There are brief runs of paroxysmal atrial tachycardia which correlate with her symptoms.   Occasional PVCs, ventricular bigeminy, trigeminy.   One episode of second-degree heart block type I, Wenkebach    Reassuring that we did not see any evidence of atrial fibrillation. Her symptomatology is consistent with her previously described paroxysmal atrial tachycardia. We will continue with low-dose metoprolol.  Candee Furbish, MD  EKG:  EKG is not ordered today.  Previous EKGs reviewed.  Not atrial fibrillation .Prior EKG on 07/03/2018 showed atrial fibrillation heart rate 123.  Subsequent monitor reports have shown sinus rhythm occasional PVCs   Recent Labs: 02/27/2019: BUN 18; Creatinine, Ser 0.89; Hemoglobin 8.5; Platelets 186; Potassium 4.1; Sodium 136  Recent Lipid Panel    Component Value Date/Time   CHOL 192 10/11/2016 1108   CHOL 185 10/05/2015 0950   TRIG 78 10/11/2016 1108   HDL 69 10/11/2016 1108   HDL 67 10/05/2015 0950   CHOLHDL 2.8 10/11/2016 1108   VLDL 16 10/11/2016 1108   LDLCALC 107 (H) 10/11/2016 1108   LDLCALC 100 (H) 10/05/2015 0950    Physical Exam:    VS:  BP (!) 130/80    Pulse 62    Ht 5\' 3"  (1.6 m)    Wt 130 lb 6.4 oz (59.1 kg)    SpO2 98%    BMI 23.10 kg/m     Wt Readings from Last 3 Encounters:  02/26/20 130 lb 6.4 oz (59.1 kg)  02/25/19 132 lb 2 oz (59.9 kg)  02/22/19 132 lb 2 oz (59.9 kg)     GEN: Well nourished, well developed, in no acute distress  HEENT: blind Neck: no JVD, carotid bruits, or  masses Cardiac: RRR; no  murmurs, rubs, or gallops,no edema  Respiratory:  clear to auscultation bilaterally, normal work of breathing GI: soft, nontender, nondistended, + BS MS: no deformity or atrophy  Skin: warm and dry, no rash Neuro:  Alert and Oriented x 3, Strength and sensation are intact Psych: euthymic mood, full affect   ASSESSMENT:    1. Paroxysmal atrial fibrillation (HCC)   2. Other secondary pulmonary hypertension (HCC)   3. Paroxysmal atrial tachycardia (HCC)    PLAN:    In order of problems listed above:  Paroxysmal atrial tachycardia - This has improved.  I am comfortable with her coming off of her metoprolol 12.5 mg twice a day at this point.  Blood pressure is excellent, pulse is excellent.  There was no evidence of atrial fibrillation on telemetry.  Mildly elevated pulmonary pressures -Estimated on echocardiogram, 45 mmHg.  Likely secondary.  Overall doing well.  No lower extremity edema.  Elevated troponin -Demand ischemia in the setting of her atrial tachycardia and underlying C. difficile colitis.  She is not having any symptoms, no ischemic changes on ECG.  We will continue to modify risk factors, her blood pressure is excellent.  She is on low-dose statin.  No further testing at this time unless symptoms occur.  No changes today.  Continue.  Excellent.   Medication Adjustments/Labs and Tests Ordered: Current medicines are reviewed at length with the patient today.  Concerns regarding medicines are outlined above.  Orders Placed This Encounter  Procedures   EKG 12-Lead   No orders of the defined types were placed in this encounter.   Patient Instructions  Medication Instructions:  The current medical regimen is effective;  continue present plan and medications.  *If you need a refill on your cardiac medications before your next appointment, please call your pharmacy*  Follow-Up: At [facility], you and your health needs are our priority.  As  part of our continuing mission to provide you with exceptional heart care, we have created designated Provider Care Teams.  These Care Teams include your primary Cardiologist (physician) and Advanced Practice Providers (APPs -  Physician Assistants and Nurse Practitioners) who all work together to provide you with the care you need, when you need it.  We recommend signing up for the patient portal called "MyChart".  Sign up information is provided on this After Visit Summary.  MyChart is used to connect with patients for Virtual Visits (Telemedicine).  Patients are able to view lab/test results, encounter notes, upcoming appointments, etc.  Non-urgent messages can be sent to your provider as well.   To learn more about what you can do with MyChart, go to [email or web address].    Your next appointment:   12 month(s)  The format for your next appointment:   In Person  Provider:   Candee Furbish, MD  Thank you for choosing [facility]!!        Signed, Candee Furbish, MD  02/26/2020 5:16 PM    [place]

## 2020-04-07 ENCOUNTER — Other Ambulatory Visit: Payer: Self-pay | Admitting: Cardiology

## 2020-05-27 NOTE — Patient Instructions (Addendum)
DUE TO COVID-19 ONLY ONE VISITOR IS ALLOWED TO COME WITH YOU AND STAY IN THE WAITING ROOM ONLY DURING PRE OP AND PROCEDURE.   IF YOU WILL BE ADMITTED INTO THE HOSPITAL YOU ARE ALLOWED ONE SUPPORT PERSON DURING VISITATION HOURS ONLY (10AM -8PM)   . The support person may change daily. . The support person must pass our screening, gel in and out, and wear a mask at all times, including in the patient's room. . Patients must also wear a mask when staff or their support person are in the room.   COVID SWAB TESTING MUST BE COMPLETED ON:  Monday, 06-01-20 @ 1:05 PM  [address] [place], [place] [address]  (Must self quarantine after testing. Follow instructions on handout.)   Your procedure is scheduled on:  Thursday, 06-04-20   Report to [facility] Main  Entrance   Report to Short Stay at 5:30 AM   [facility])   Call this number if you have problems the morning of surgery [phone]   Do not eat food :After Midnight.   May have liquids until 4:30 AM  day of surgery  CLEAR LIQUID DIET  Foods Allowed                                                                     Foods Excluded  Water, Black Coffee and tea, regular and decaf               liquids that you cannot  Plain Jell-O in any flavor  (No red)                                     see through such as: Fruit ices (not with fruit pulp)                                      milk, soups, orange juice              Iced Popsicles (No red)                                      All solid food                                   Apple juices Sports drinks like Gatorade (No red) Lightly seasoned clear broth or consume(fat free) Sugar, honey syrup     Complete one Ensure drink the morning of surgery at  4:30 AM  the day of surgery.    Oral Hygiene is also important to reduce your risk of infection.                                    Remember - BRUSH YOUR TEETH THE MORNING OF SURGERY WITH YOUR REGULAR TOOTHPASTE   Do NOT  smoke after Midnight   Take these  medicines the morning of surgery with A SIP OF WATER:  Metoprolol, okay to use eyedrops                                You may not have any metal on your body including hair pins, jewelry, and body piercings             Do not wear make-up, lotions, powders, perfumes/cologne, or deodorant             Do not wear nail polish.  Do not shave  48 hours prior to surgery.                Do not bring valuables to the hospital. [place].   Contacts, dentures or bridgework may not be worn into surgery.     Patients discharged the day of surgery will not be allowed to drive home.               Please read over the following fact sheets you were given: IF YOU HAVE QUESTIONS ABOUT YOUR PRE OP INSTRUCTIONS PLEASE CALL [facility]- Preparing for Total Shoulder Arthroplasty    Before surgery, you can play an important role. Because skin is not sterile, your skin needs to be as free of germs as possible. You can reduce the number of germs on your skin by using the following products. . Benzoyl Peroxide Gel o Reduces the number of germs present on the skin o Applied twice a day to shoulder area starting two days before surgery    ==================================================================  Please follow these instructions carefully:  BENZOYL PEROXIDE 5% GEL  Please do not use if you have an allergy to benzoyl peroxide.   If your skin becomes reddened/irritated stop using the benzoyl peroxide.  Starting two days before surgery, apply as follows: 1. Apply benzoyl peroxide in the morning and at night. Apply after taking a shower. If you are not taking a shower clean entire shoulder front, back, and side along with the armpit with a clean wet washcloth.  2. Place a quarter-sized dollop on your shoulder and rub in thoroughly, making sure to cover the front, back, and side of your shoulder, along with the  armpit.   2 days before ____ AM   ____ PM              1 day before ____ AM   ____ PM                         3. Do this twice a day for two days.  (Last application is the night before surgery, AFTER using the CHG soap as described below).  4. Do NOT apply benzoyl peroxide gel on the day of surgery.  [facility] - Preparing for Surgery Before surgery, you can play an important role.  Because skin is not sterile, your skin needs to be as free of germs as possible.  You can reduce the number of germs on your skin by washing with CHG (chlorahexidine gluconate) soap before surgery.  CHG is an antiseptic cleaner which kills germs and bonds with the skin to continue killing germs even after washing. Please DO NOT use if you have an allergy to CHG or antibacterial soaps.  If your skin becomes reddened/irritated stop using the CHG and inform your nurse when  you arrive at Short Stay. Do not shave (including legs and underarms) for at least 48 hours prior to the first CHG shower.  You may shave your face/neck.  Please follow these instructions carefully:  1.  Shower with CHG Soap the night before surgery and the  morning of surgery.  2.  If you choose to wash your hair, wash your hair first as usual with your normal  shampoo.  3.  After you shampoo, rinse your hair and body thoroughly to remove the shampoo.                             4.  Use CHG as you would any other liquid soap.  You can apply chg directly to the skin and wash.  Gently with a scrungie or clean washcloth.  5.  Apply the CHG Soap to your body ONLY FROM THE NECK DOWN.   Do   not use on face/ open                           Wound or open sores. Avoid contact with eyes, ears mouth and   genitals (private parts).                       Wash face,  Genitals (private parts) with your normal soap.             6.  Wash thoroughly, paying special attention to the area where your    surgery  will be performed.  7.  Thoroughly rinse your body with  warm water from the neck down.  8.  DO NOT shower/wash with your normal soap after using and rinsing off the CHG Soap.                9.  Pat yourself dry with a clean towel.            10.  Wear clean pajamas.            11.  Place clean sheets on your bed the night of your first shower and do not  sleep with pets. Day of Surgery : Do not apply any lotions/deodorants the morning of surgery.  Please wear clean clothes to the hospital/surgery center.  FAILURE TO FOLLOW THESE INSTRUCTIONS MAY RESULT IN THE CANCELLATION OF YOUR SURGERY  PATIENT SIGNATURE_________________________________  NURSE SIGNATURE__________________________________  ________________________________________________________________________   Adam Phenix  An incentive spirometer is a tool that can help keep your lungs clear and active. This tool measures how well you are filling your lungs with each breath. Taking long deep breaths may help reverse or decrease the chance of developing breathing (pulmonary) problems (especially infection) following:  A long period of time when you are unable to move or be active. BEFORE THE PROCEDURE   If the spirometer includes an indicator to show your best effort, your nurse or respiratory therapist will set it to a desired goal.  If possible, sit up straight or lean slightly forward. Try not to slouch.  Hold the incentive spirometer in an upright position. INSTRUCTIONS FOR USE  1. Sit on the edge of your bed if possible, or sit up as far as you can in bed or on a chair. 2. Hold the incentive spirometer in an upright position. 3. Breathe out normally. 4. Place the mouthpiece in your mouth and seal your lips tightly around it.  5. Breathe in slowly and as deeply as possible, raising the piston or the ball toward the top of the column. 6. Hold your breath for 3-5 seconds or for as long as possible. Allow the piston or ball to fall to the bottom of the column. 7. Remove the  mouthpiece from your mouth and breathe out normally. 8. Rest for a few seconds and repeat Steps 1 through 7 at least 10 times every 1-2 hours when you are awake. Take your time and take a few normal breaths between deep breaths. 9. The spirometer may include an indicator to show your best effort. Use the indicator as a goal to work toward during each repetition. 10. After each set of 10 deep breaths, practice coughing to be sure your lungs are clear. If you have an incision (the cut made at the time of surgery), support your incision when coughing by placing a pillow or rolled up towels firmly against it. Once you are able to get out of bed, walk around indoors and cough well. You may stop using the incentive spirometer when instructed by your caregiver.  RISKS AND COMPLICATIONS  Take your time so you do not get dizzy or light-headed.  If you are in pain, you may need to take or ask for pain medication before doing incentive spirometry. It is harder to take a deep breath if you are having pain. AFTER USE  Rest and breathe slowly and easily.  It can be helpful to keep track of a log of your progress. Your caregiver can provide you with a simple table to help with this. If you are using the spirometer at home, follow these instructions: [place] IF:   You are having difficultly using the spirometer.  You have trouble using the spirometer as often as instructed.  Your pain medication is not giving enough relief while using the spirometer.  You develop fever of 100.5 F (38.1 C) or higher. SEEK IMMEDIATE MEDICAL CARE IF:   You cough up bloody sputum that had not been present before.  You develop fever of 102 F (38.9 C) or greater.  You develop worsening pain at or near the incision site. MAKE SURE YOU:   Understand these instructions.  Will watch your condition.  Will get help right away if you are not doing well or get worse. Document Released: 11/28/2006 Document  Revised: 10/10/2011 Document Reviewed: 01/29/2007 [facility] Patient Information 2014 [place], [place].   ________________________________________________________________________

## 2020-05-27 NOTE — Progress Notes (Addendum)
COVID Vaccine Completed:  x2 Date COVID Vaccine completed: 1-19-09-09-08-13 COVID vaccine manufacturer: [place]   PCP - Jillyn Ledger, FNP Cardiologist - Candee Furbish, MD.  Last OV 02-26-20  Chest x-ray -  EKG - 02-06-20 in Epic Stress Test -  ECHO - 07-04-18 in Epic Cardiac Cath -  Pacemaker/ICD device last checked: Long Term Monitor - 12-14-18 in Epic   Sleep Study -  CPAP -   Fasting Blood Sugar -  Checks Blood Sugar _____ times a day  Blood Thinner Instructions: Aspirin Instructions:  ASA 81 mg  Last Dose:  Stopped a week ago  Anesthesia review: Atrial tachycardia, hx of Afib,  Pulmonary hypertension  Takes Metoprolol to regulate pulse, not for HTN  Patient denies shortness of breath, fever, cough and chest pain at PAT appointment   Patient verbalized understanding of instructions that were given to them at the PAT appointment. Patient was also instructed that they will need to review over the PAT instructions again at home before surgery.

## 2020-05-28 ENCOUNTER — Encounter (HOSPITAL_COMMUNITY): Payer: Self-pay

## 2020-05-28 ENCOUNTER — Encounter (HOSPITAL_COMMUNITY)
Admission: RE | Admit: 2020-05-28 | Discharge: 2020-05-28 | Disposition: A | Discharge status: Home / Self Care | Payer: Medicare Other | Source: Ambulatory Visit | Attending: Orthopedic Surgery | Admitting: Orthopedic Surgery

## 2020-05-28 ENCOUNTER — Other Ambulatory Visit: Payer: Self-pay

## 2020-05-28 DIAGNOSIS — Z01812 Encounter for preprocedural laboratory examination: Secondary | ICD-10-CM | POA: Insufficient documentation

## 2020-05-28 LAB — CBC
HCT: 34 % — ABNORMAL LOW (ref 36.0–46.0)
Hemoglobin: 10.7 g/dL — ABNORMAL LOW (ref 12.0–15.0)
MCH: 30.6 pg (ref 26.0–34.0)
MCHC: 31.5 g/dL (ref 30.0–36.0)
MCV: 97.1 fL (ref 80.0–100.0)
Platelets: 279 10*3/uL (ref 150–400)
RBC: 3.5 MIL/uL — ABNORMAL LOW (ref 3.87–5.11)
RDW: 13 % (ref 11.5–15.5)
WBC: 6.5 10*3/uL (ref 4.0–10.5)
nRBC: 0 % (ref 0.0–0.2)

## 2020-05-28 LAB — BASIC METABOLIC PANEL
Anion gap: 13 (ref 5–15)
BUN: 25 mg/dL — ABNORMAL HIGH (ref 8–23)
CO2: 24 mmol/L (ref 22–32)
Calcium: 9.8 mg/dL (ref 8.9–10.3)
Chloride: 103 mmol/L (ref 98–111)
Creatinine, Ser: 1.04 mg/dL — ABNORMAL HIGH (ref 0.44–1.00)
GFR, Estimated: 54 mL/min — ABNORMAL LOW (ref >60)
Glucose, Bld: 111 mg/dL — ABNORMAL HIGH (ref 70–99)
Potassium: 5.4 mmol/L — ABNORMAL HIGH (ref 3.5–5.1)
Sodium: 140 mmol/L (ref 135–145)

## 2020-05-28 LAB — SURGICAL PCR SCREEN
MRSA, PCR: NEGATIVE
Staphylococcus aureus: NEGATIVE

## 2020-05-29 NOTE — Anesthesia Preprocedure Evaluation (Addendum)
Anesthesia Evaluation  Patient identified by MRN, date of birth, ID band Patient awake    Reviewed: Allergy & Precautions, NPO status , Patient's Chart, lab work & pertinent test results  History of Anesthesia Complications (+) history of anesthetic complications (hypotension, panic attack)  Airway Mallampati: II  TM Distance: >3 FB Neck ROM: Full    Dental no notable dental hx. (+) Teeth Intact, Dental Advisory Given   Pulmonary former smoker,  No inhalers   Pulmonary exam normal breath sounds clear to auscultation       Cardiovascular Normal cardiovascular exam+ dysrhythmias (metoprolol ) Atrial Fibrillation + Valvular Problems/Murmurs (mild-mod TR)  Rhythm:Regular Rate:Normal  echo 2019: Event monitor 01/07/2019:  Sinus rhythm predominant. No atrial fibrillation identified.   There are brief runs of paroxysmal atrial tachycardia which correlate with her symptoms.   Occasional PVCs, ventricular bigeminy, trigeminy.   One episode of second-degree heart block type I, Wenkebach    ECHO 07/04/18: - Left ventricle: The cavity size was normal. Systolic function was normal. The estimated ejection fraction was in the range of 55%to 60%. Wall motion was normal; there were no regional wallmotion abnormalities. Features are consistent with a pseudonormalleft ventricular filling pattern, with concomitant abnormalrelaxation and increased filling pressure (grade 2 diastolic dysfunction). - Aortic valve: There was trivial regurgitation. - Left atrium: The atrium was mildly dilated. - Right atrium: The atrium was moderately dilated. - Atrial septum: No defect or patent foramen ovale was identified. - Tricuspid valve: There was mild-moderate regurgitation. - Pulmonary arteries: Systolic pressure was moderately increased. PA peak pressure: 45 mm Hg (S). - Impressions: Normal LV EF, grade 2 diastolic dysfunction, elevated PA systolicpressure.  Mild-moderate TR.    Neuro/Psych  Headaches, negative psych ROS   GI/Hepatic negative GI ROS, Neg liver ROS,   Endo/Other  negative endocrine ROS  Renal/GU negative Renal ROS  negative genitourinary   Musculoskeletal  (+) Arthritis , Osteoarthritis,  Right rotator cuff tear    Abdominal   Peds  Hematology  (+) Blood dyscrasia, anemia , hct 34   Anesthesia Other Findings Legally blind  Reproductive/Obstetrics negative OB ROS                          Anesthesia Physical Anesthesia Plan  ASA: III  Anesthesia Plan: General and Regional   Post-op Pain Management: GA combined w/ Regional for post-op pain   Induction: Intravenous  PONV Risk Score and Plan: 3 and Ondansetron, Dexamethasone and Treatment may vary due to age or medical condition  Airway Management Planned: Oral ETT  Additional Equipment: None  Intra-op Plan:   Post-operative Plan:   Informed Consent: I have reviewed the patients History and Physical, chart, labs and discussed the procedure including the risks, benefits and alternatives for the proposed anesthesia with the patient or authorized representative who has indicated his/her understanding and acceptance.       Plan Discussed with: CRNA  Anesthesia Plan Comments:        Anesthesia Quick Evaluation

## 2020-05-29 NOTE — Progress Notes (Signed)
Anesthesia Chart Review:   Case: [id number] Date/Time: 06/04/20 0715   Procedure: REVERSE SHOULDER ARTHROPLASTY (Right Shoulder) - 18min   Anesthesia type: General   Pre-op diagnosis: Right shoulder rotator cuff tear athropathy   [facility] ROOM 06 / [facility]   Surgeons: Justice Britain, MD      DISCUSSION:  Pt is an 84 year old with hx atrial tachycardia, hx afib with RVR (in the setting of cdiff colitis 2019), pulmonary HTN, iron deficiency anemia   VS: BP 132/71    Pulse 64    Temp 36.7 C (Oral)    Resp 18    Ht 5\' 4"  (1.626 m)    SpO2 100%    BMI 22.38 kg/m    PROVIDERS: - PCP is Kristen Loader, FNP - Cardiologist is Candee Furbish, MD. Last office visit 02/26/20   LABS: Labs reviewed: Acceptable for surgery. (all labs ordered are listed, but only abnormal results are displayed)  Labs Reviewed  BASIC METABOLIC PANEL - Abnormal; Notable for the following components:      Result Value   Potassium 5.4 (*)    Glucose, Bld 111 (*)    BUN 25 (*)    Creatinine, Ser 1.04 (*)    GFR, Estimated 54 (*)    All other components within normal limits  CBC - Abnormal; Notable for the following components:   RBC 3.50 (*)    Hemoglobin 10.7 (*)    HCT 34.0 (*)    All other components within normal limits  SURGICAL PCR SCREEN     IMAGES:   EKG 02/26/20:  SR   CV: Event monitor 01/07/2019:  Sinus rhythm predominant. No atrial fibrillation identified.   There are brief runs of paroxysmal atrial tachycardia which correlate with her symptoms.   Occasional PVCs, ventricular bigeminy, trigeminy.   One episode of second-degree heart block type I, Wenkebach    ECHO 07/04/18: - Left ventricle: The cavity size was normal. Systolic function was normal. The estimated ejection fraction was in the range of 55%to 60%. Wall motion was normal; there were no regional wallmotion abnormalities. Features are consistent with a pseudonormalleft ventricular filling pattern, with concomitant  abnormalrelaxation and increased filling pressure (grade 2 diastolic dysfunction). - Aortic valve: There was trivial regurgitation. - Left atrium: The atrium was mildly dilated. - Right atrium: The atrium was moderately dilated. - Atrial septum: No defect or patent foramen ovale was identified. - Tricuspid valve: There was mild-moderate regurgitation. - Pulmonary arteries: Systolic pressure was moderately increased. PA peak pressure: 45 mm Hg (S). - Impressions: Normal LV EF, grade 2 diastolic dysfunction, elevated PA systolicpressure. Mild-moderate TR.    Past Medical History:  Diagnosis Date   Arthritis    Blind 10/12/2016   Has learned Braille   C. difficile colitis    Complication of anesthesia    "Blood pressure has gone too low before and effected vision" just with general   Complication of anesthesia    "woke up in severe panic attack" just with general   Glaucoma    Herpes zoster with unspecified complication    Hip dysplasia    right side   History of atrial fibrillation    RVR   History of sinus tachycardia    History of transfusion 2014   2 units, no reaction-after knee replacement   Hyperlipidemia    under control   Iron deficiency anemia    Macular degeneration    Migraine    hx of  Osteoarthrosis, unspecified whether generalized or localized, unspecified site    Right hip pain    hx     Past Surgical History:  Procedure Laterality Date   APPENDECTOMY  1979   BELPHAROPTOSIS REPAIR     eye lid drooping repair   COLONOSCOPY     ovarian tumor   1979   benign tumor removed    REVERSE SHOULDER ARTHROPLASTY Left 12/21/2017   Procedure: LEFT REVERSE SHOULDER ARTHROPLASTY;  Surgeon: Justice Britain, MD;  Location: [facility];  Service: Orthopedics;  Laterality: Left;   ROBOTIC ASSISTED TOTAL HYSTERECTOMY WITH BILATERAL SALPINGO OOPHERECTOMY Bilateral 03/25/2014   Procedure: ROBOTIC ASSISTED TOTAL HYSTERECTOMY WITH BILATERAL SALPINGO  OOPHORECTOMY;  Surgeon: Everitt Amber, MD;  [facility];  Service: Gynecology;  Laterality: Bilateral;   TONSILLECTOMY     1945    TOTAL HIP ARTHROPLASTY Right 06/04/2014   Procedure: RIGHT TOTAL HIP ARTHROPLASTY ANTERIOR APPROACH;  Surgeon: Gearlean Alf, MD;  [facility];  Service: Orthopedics;  Laterality: Right;   TOTAL HIP ARTHROPLASTY Left 05/10/2017   Procedure: LEFT TOTAL HIP ARTHROPLASTY ANTERIOR APPROACH;  Surgeon: Gaynelle Arabian, MD;  [facility];  Service: Orthopedics;  Laterality: Left;  90 mins   TOTAL KNEE ARTHROPLASTY Right 09/10/2012   Procedure: RIGHT TOTAL KNEE ARTHROPLASTY;  Surgeon: Gearlean Alf, MD;  [facility];  Service: Orthopedics;  Laterality: Right;   TOTAL KNEE ARTHROPLASTY Left 02/25/2019   Procedure: TOTAL KNEE ARTHROPLASTY;  Surgeon: Gaynelle Arabian, MD;  [facility];  Service: Orthopedics;  Laterality: Left;  70min    MEDICATIONS:  acetaminophen (TYLENOL) 650 MG CR tablet   aspirin EC 81 MG tablet   atorvastatin (LIPITOR) 10 MG tablet   Calcium Carb-Cholecalciferol (CALCIUM+D3 PO)   Cholecalciferol (VITAMIN D3) 50 MCG (2000 UT) TABS   ferrous sulfate 324 MG TBEC   latanoprost (XALATAN) 0.005 % ophthalmic solution   LUTEIN PO   meclizine (ANTIVERT) 12.5 MG tablet   methocarbamol (ROBAXIN) 500 MG tablet   metoprolol tartrate (LOPRESSOR) 25 MG tablet   Multiple Vitamins-Minerals (PRESERVISION AREDS 2+MULTI VIT) CAPS   oxyCODONE (OXY IR/ROXICODONE) 5 MG immediate release tablet   QUEtiapine (SEROQUEL) 25 MG tablet   SIMBRINZA 1-0.2 % SUSP   timolol (TIMOPTIC) 0.5 % ophthalmic solution   No current facility-administered medications for this encounter.    If no changes, I anticipate pt can proceed with surgery as scheduled.   Willeen Cass, PhD, FNP-BC [facility] Short Stay Surgical Center/Anesthesiology Phone: [phone] 05/29/2020 12:24 PM

## 2020-06-01 ENCOUNTER — Other Ambulatory Visit (HOSPITAL_COMMUNITY)
Admission: RE | Admit: 2020-06-01 | Discharge: 2020-06-01 | Disposition: A | Discharge status: Home / Self Care | Payer: Medicare Other | Source: Ambulatory Visit | Attending: Orthopedic Surgery | Admitting: Orthopedic Surgery

## 2020-06-01 DIAGNOSIS — Z20822 Contact with and (suspected) exposure to covid-19: Secondary | ICD-10-CM | POA: Insufficient documentation

## 2020-06-01 DIAGNOSIS — Z01818 Encounter for other preprocedural examination: Secondary | ICD-10-CM | POA: Diagnosis present

## 2020-06-02 LAB — SARS CORONAVIRUS 2 (TAT 6-24 HRS): SARS Coronavirus 2: NEGATIVE

## 2020-06-04 ENCOUNTER — Encounter (HOSPITAL_COMMUNITY): Payer: Self-pay | Admitting: Orthopedic Surgery

## 2020-06-04 ENCOUNTER — Ambulatory Visit (HOSPITAL_COMMUNITY): Payer: Medicare Other | Admitting: Emergency Medicine

## 2020-06-04 ENCOUNTER — Ambulatory Visit (HOSPITAL_COMMUNITY): Payer: Medicare Other | Admitting: Certified Registered Nurse Anesthetist

## 2020-06-04 ENCOUNTER — Ambulatory Visit (HOSPITAL_COMMUNITY)
Admission: RE | Admit: 2020-06-04 | Discharge: 2020-06-04 | Disposition: A | Discharge status: Home / Self Care | Payer: Medicare Other | Attending: Orthopedic Surgery | Admitting: Orthopedic Surgery

## 2020-06-04 ENCOUNTER — Encounter (HOSPITAL_COMMUNITY)
Admission: RE | Disposition: A | Discharge status: Home / Self Care | Payer: Self-pay | Source: Home / Self Care | Attending: Orthopedic Surgery

## 2020-06-04 DIAGNOSIS — Z87891 Personal history of nicotine dependence: Secondary | ICD-10-CM | POA: Diagnosis not present

## 2020-06-04 DIAGNOSIS — M75101 Unspecified rotator cuff tear or rupture of right shoulder, not specified as traumatic: Secondary | ICD-10-CM | POA: Insufficient documentation

## 2020-06-04 DIAGNOSIS — Z96653 Presence of artificial knee joint, bilateral: Secondary | ICD-10-CM | POA: Insufficient documentation

## 2020-06-04 DIAGNOSIS — Z96643 Presence of artificial hip joint, bilateral: Secondary | ICD-10-CM | POA: Insufficient documentation

## 2020-06-04 HISTORY — PX: REVERSE SHOULDER ARTHROPLASTY: SHX5054

## 2020-06-04 SURGERY — ARTHROPLASTY, SHOULDER, TOTAL, REVERSE
Anesthesia: Regional | Site: Shoulder | Laterality: Right

## 2020-06-04 MED ORDER — EPHEDRINE SULFATE 50 MG/ML IJ SOLN: INTRAMUSCULAR | Status: DC | PRN

## 2020-06-04 MED ORDER — FENTANYL CITRATE (PF) 100 MCG/2ML IJ SOLN
INTRAMUSCULAR | Status: AC
  Filled 2020-06-04: qty 2

## 2020-06-04 MED ORDER — ONDANSETRON HCL 4 MG/2ML IJ SOLN: 4.0000 mg | Freq: Four times a day (QID) | INTRAMUSCULAR | Status: DC | PRN

## 2020-06-04 MED ORDER — SODIUM CHLORIDE 0.9 % IR SOLN
Status: DC | PRN
  Administered 2020-06-04: 1000 mL

## 2020-06-04 MED ORDER — HYDROMORPHONE HCL 1 MG/ML IJ SOLN: 0.2500 mg | INTRAMUSCULAR | Status: DC | PRN

## 2020-06-04 MED ORDER — PHENYLEPHRINE HCL-NACL 10-0.9 MG/250ML-% IV SOLN
INTRAVENOUS | Status: DC | PRN
  Administered 2020-06-04: 25 ug/min via INTRAVENOUS

## 2020-06-04 MED ORDER — LIDOCAINE 2% (20 MG/ML) 5 ML SYRINGE
INTRAMUSCULAR | Status: AC
  Filled 2020-06-04: qty 5

## 2020-06-04 MED ORDER — PHENYLEPHRINE HCL (PRESSORS) 10 MG/ML IV SOLN
INTRAVENOUS | Status: AC
  Filled 2020-06-04: qty 1

## 2020-06-04 MED ORDER — BUPIVACAINE HCL (PF) 0.5 % IJ SOLN
INTRAMUSCULAR | Status: DC | PRN
  Administered 2020-06-04: 15 mL

## 2020-06-04 MED ORDER — DEXAMETHASONE SODIUM PHOSPHATE 4 MG/ML IJ SOLN
INTRAMUSCULAR | Status: DC | PRN
  Administered 2020-06-04: 5 mg via INTRAVENOUS

## 2020-06-04 MED ORDER — LIDOCAINE 2% (20 MG/ML) 5 ML SYRINGE
INTRAMUSCULAR | Status: DC | PRN
  Administered 2020-06-04: 60 mg via INTRAVENOUS

## 2020-06-04 MED ORDER — CHLORHEXIDINE GLUCONATE 0.12 % MT SOLN: 15.0000 mL | Freq: Once | OROMUCOSAL | Status: AC

## 2020-06-04 MED ORDER — STERILE WATER FOR IRRIGATION IR SOLN
Status: DC | PRN
  Administered 2020-06-04: 2000 mL

## 2020-06-04 MED ORDER — ONDANSETRON HCL 4 MG PO TABS
4.0000 mg | ORAL_TABLET | Freq: Four times a day (QID) | ORAL | Status: DC | PRN
  Filled 2020-06-04: qty 1

## 2020-06-04 MED ORDER — PROMETHAZINE HCL 25 MG/ML IJ SOLN: 6.2500 mg | INTRAMUSCULAR | Status: DC | PRN

## 2020-06-04 MED ORDER — ROCURONIUM BROMIDE 10 MG/ML (PF) SYRINGE
PREFILLED_SYRINGE | INTRAVENOUS | Status: AC
  Filled 2020-06-04: qty 10

## 2020-06-04 MED ORDER — OXYCODONE HCL 5 MG PO TABS: 5.0000 mg | ORAL_TABLET | Freq: Once | ORAL | Status: DC | PRN

## 2020-06-04 MED ORDER — PROPOFOL 10 MG/ML IV BOLUS
INTRAVENOUS | Status: AC
  Filled 2020-06-04: qty 20

## 2020-06-04 MED ORDER — ORAL CARE MOUTH RINSE
15.0000 mL | Freq: Once | OROMUCOSAL | Status: AC
  Administered 2020-06-04: 15 mL via OROMUCOSAL

## 2020-06-04 MED ORDER — BUPIVACAINE LIPOSOME 1.3 % IJ SUSP
INTRAMUSCULAR | Status: DC | PRN
  Administered 2020-06-04: 10 mL via PERINEURAL

## 2020-06-04 MED ORDER — LACTATED RINGERS IV SOLN: INTRAVENOUS | Status: DC

## 2020-06-04 MED ORDER — OXYCODONE-ACETAMINOPHEN 5-325 MG PO TABS
1.0000 | ORAL_TABLET | ORAL | 0 refills | Status: DC | PRN
Start: 2020-06-04 — End: 2021-05-12

## 2020-06-04 MED ORDER — LACTATED RINGERS IV BOLUS: 500.0000 mL | Freq: Once | INTRAVENOUS | Status: DC

## 2020-06-04 MED ORDER — OXYCODONE HCL 5 MG/5ML PO SOLN: 5.0000 mg | Freq: Once | ORAL | Status: DC | PRN

## 2020-06-04 MED ORDER — METOCLOPRAMIDE HCL 5 MG/ML IJ SOLN: 5.0000 mg | Freq: Three times a day (TID) | INTRAMUSCULAR | Status: DC | PRN

## 2020-06-04 MED ORDER — METOCLOPRAMIDE HCL 5 MG PO TABS
5.0000 mg | ORAL_TABLET | Freq: Three times a day (TID) | ORAL | Status: DC | PRN
  Filled 2020-06-04: qty 2

## 2020-06-04 MED ORDER — METHOCARBAMOL 500 MG PO TABS: 500.0000 mg | ORAL_TABLET | Freq: Three times a day (TID) | ORAL | 1 refills | Status: DC | PRN

## 2020-06-04 MED ORDER — LACTATED RINGERS IV BOLUS: 250.0000 mL | Freq: Once | INTRAVENOUS | Status: DC

## 2020-06-04 MED ORDER — FENTANYL CITRATE (PF) 100 MCG/2ML IJ SOLN
INTRAMUSCULAR | Status: DC | PRN
  Administered 2020-06-04 (×3): 50 ug via INTRAVENOUS

## 2020-06-04 MED ORDER — PROPOFOL 10 MG/ML IV BOLUS
INTRAVENOUS | Status: DC | PRN
  Administered 2020-06-04: 120 mg via INTRAVENOUS

## 2020-06-04 MED ORDER — ONDANSETRON HCL 4 MG PO TABS: 4.0000 mg | ORAL_TABLET | Freq: Three times a day (TID) | ORAL | 0 refills | Status: DC | PRN

## 2020-06-04 MED ORDER — DEXAMETHASONE SODIUM PHOSPHATE 10 MG/ML IJ SOLN
INTRAMUSCULAR | Status: AC
  Filled 2020-06-04: qty 1

## 2020-06-04 MED ORDER — SUGAMMADEX SODIUM 200 MG/2ML IV SOLN
INTRAVENOUS | Status: DC | PRN
  Administered 2020-06-04: 200 mg via INTRAVENOUS

## 2020-06-04 MED ORDER — ROCURONIUM BROMIDE 10 MG/ML (PF) SYRINGE
PREFILLED_SYRINGE | INTRAVENOUS | Status: DC | PRN
  Administered 2020-06-04: 70 mg via INTRAVENOUS

## 2020-06-04 MED ORDER — ONDANSETRON HCL 4 MG/2ML IJ SOLN
INTRAMUSCULAR | Status: AC
  Filled 2020-06-04: qty 2

## 2020-06-04 MED ORDER — ACETAMINOPHEN 500 MG PO TABS
1000.0000 mg | ORAL_TABLET | Freq: Once | ORAL | Status: AC
  Administered 2020-06-04: 1000 mg via ORAL
  Filled 2020-06-04: qty 2

## 2020-06-04 MED ORDER — CEFAZOLIN SODIUM-DEXTROSE 2-4 GM/100ML-% IV SOLN
2.0000 g | INTRAVENOUS | Status: AC
  Administered 2020-06-04: 2 g via INTRAVENOUS
  Filled 2020-06-04: qty 100

## 2020-06-04 MED ORDER — TRANEXAMIC ACID-NACL 1000-0.7 MG/100ML-% IV SOLN
1000.0000 mg | INTRAVENOUS | Status: AC
  Administered 2020-06-04: 1000 mg via INTRAVENOUS
  Filled 2020-06-04: qty 100

## 2020-06-04 MED ORDER — EPHEDRINE SULFATE-NACL 50-0.9 MG/10ML-% IV SOSY
PREFILLED_SYRINGE | INTRAVENOUS | Status: DC | PRN
  Administered 2020-06-04: 5 mg via INTRAVENOUS
  Administered 2020-06-04: 10 mg via INTRAVENOUS

## 2020-06-04 SURGICAL SUPPLY — 76 items
ADH SKN CLS APL DERMABOND .7 (GAUZE/BANDAGES/DRESSINGS) ×1
BAG SPEC THK2 15X12 ZIP CLS (MISCELLANEOUS) ×1
BAG ZIPLOCK 12X15 (MISCELLANEOUS) ×3 IMPLANT
BASEPLATE GLENOSPHERE 25 (Plate) ×2 IMPLANT
BASEPLATE GLENOSPHERE 25MM (Plate) ×1 IMPLANT
BEARING HUMERAL SHLDER 36M STD (Shoulder) ×1 IMPLANT
BIT DRILL F/CENTRAL SCRW 3.2 (BIT) ×1
BIT DRILL F/CENTRAL SCRW 3.2MM (BIT) ×1 IMPLANT
BIT DRILL TWIST 2.7 (BIT) ×2 IMPLANT
BIT DRILL TWIST 2.7MM (BIT) ×1
BLADE SAW SGTL 83.5X18.5 (BLADE) ×3 IMPLANT
BRNG HUM STD 36 RVRS SHLDR (Shoulder) ×1 IMPLANT
COOLER ICEMAN CLASSIC (MISCELLANEOUS) IMPLANT
COVER BACK TABLE 60X90IN (DRAPES) ×3 IMPLANT
COVER SURGICAL LIGHT HANDLE (MISCELLANEOUS) ×3 IMPLANT
COVER WAND RF STERILE (DRAPES) IMPLANT
DERMABOND ADVANCED (GAUZE/BANDAGES/DRESSINGS) ×2
DERMABOND ADVANCED .7 DNX12 (GAUZE/BANDAGES/DRESSINGS) ×1 IMPLANT
DRAPE INCISE IOBAN 66X45 STRL (DRAPES) IMPLANT
DRAPE ORTHO SPLIT 77X108 STRL (DRAPES) ×6
DRAPE SHEET LG 3/4 BI-LAMINATE (DRAPES) ×3 IMPLANT
DRAPE SURG 17X11 SM STRL (DRAPES) ×3 IMPLANT
DRAPE SURG ORHT 6 SPLT 77X108 (DRAPES) ×2 IMPLANT
DRAPE U-SHAPE 47X51 STRL (DRAPES) ×3 IMPLANT
DRILL BIT F/CENTRAL SCRW 3.2MM (BIT) ×3
DRSG AQUACEL AG ADV 3.5X10 (GAUZE/BANDAGES/DRESSINGS) ×3 IMPLANT
DURAPREP 26ML APPLICATOR (WOUND CARE) ×3 IMPLANT
ELECT BLADE TIP CTD 4 INCH (ELECTRODE) ×3 IMPLANT
ELECT REM PT RETURN 15FT ADLT (MISCELLANEOUS) ×3 IMPLANT
FACESHIELD WRAPAROUND (MASK) ×12 IMPLANT
GLOVE BIO SURGEON STRL SZ7.5 (GLOVE) ×3 IMPLANT
GLOVE BIO SURGEON STRL SZ8 (GLOVE) ×3 IMPLANT
GLOVE SS BIOGEL STRL SZ 7 (GLOVE) ×1 IMPLANT
GLOVE SS BIOGEL STRL SZ 7.5 (GLOVE) ×1 IMPLANT
GLOVE SUPERSENSE BIOGEL SZ 7 (GLOVE) ×2
GLOVE SUPERSENSE BIOGEL SZ 7.5 (GLOVE) ×2
GLOVE SURG SYN 7.0 (GLOVE) IMPLANT
GLOVE SURG SYN 7.5  E (GLOVE)
GLOVE SURG SYN 7.5 E (GLOVE) IMPLANT
GLOVE SURG SYN 8.0 (GLOVE) IMPLANT
GOWN STRL REUS W/TWL LRG LVL3 (GOWN DISPOSABLE) ×6 IMPLANT
KIT BASIN OR (CUSTOM PROCEDURE TRAY) ×3 IMPLANT
KIT TURNOVER KIT A (KITS) ×3 IMPLANT
MANIFOLD NEPTUNE II (INSTRUMENTS) ×3 IMPLANT
NEEDLE TAPERED W/ NITINOL LOOP (MISCELLANEOUS) ×3 IMPLANT
NS IRRIG 1000ML POUR BTL (IV SOLUTION) ×3 IMPLANT
PACK SHOULDER (CUSTOM PROCEDURE TRAY) ×3 IMPLANT
PAD ARMBOARD 7.5X6 YLW CONV (MISCELLANEOUS) ×3 IMPLANT
PAD COLD SHLDR WRAP-ON (PAD) IMPLANT
PIN NITINOL TARGETER 2.8 (PIN) IMPLANT
PIN SET MODULAR GLENOID SYSTEM (PIN) IMPLANT
PIN STEINMANN THREADED TIP (PIN) ×3 IMPLANT
PIN THREADED REVERSE (PIN) ×3 IMPLANT
RESTRAINT HEAD UNIVERSAL NS (MISCELLANEOUS) ×3 IMPLANT
SCREW BONE LOCKING 4.75X30X3.5 (Screw) ×6 IMPLANT
SCREW BONE STRL 6.5MMX25MM (Screw) ×3 IMPLANT
SCREW LOCKING 4.75MMX15MM (Screw) ×6 IMPLANT
SHOULDER HUMERAL BEAR 36M STD (Shoulder) ×3 IMPLANT
SLING ARM FOAM STRAP LRG (SOFTGOODS) IMPLANT
SLING ARM FOAM STRAP MED (SOFTGOODS) ×3 IMPLANT
SPHERE GLENOID +3 36 DIA TI (Joint) ×3 IMPLANT
SPONGE LAP 18X18 RF (DISPOSABLE) IMPLANT
STEM HUMERAL STRL 11MMX55MM (Stem) ×3 IMPLANT
SUCTION FRAZIER HANDLE 12FR (TUBING) ×3
SUCTION TUBE FRAZIER 12FR DISP (TUBING) ×1 IMPLANT
SUT FIBERWIRE #2 38 T-5 BLUE (SUTURE) ×3
SUT MNCRL AB 3-0 PS2 18 (SUTURE) ×3 IMPLANT
SUT MON AB 2-0 CT1 36 (SUTURE) ×3 IMPLANT
SUT VIC AB 1 CT1 36 (SUTURE) ×3 IMPLANT
SUTURE FIBERWR #2 38 T-5 BLUE (SUTURE) ×1 IMPLANT
SUTURE TAPE 1.3 40 TPR END (SUTURE) ×2 IMPLANT
SUTURETAPE 1.3 40 TPR END (SUTURE) ×6
TOWEL OR 17X26 10 PK STRL BLUE (TOWEL DISPOSABLE) ×3 IMPLANT
TOWEL OR NON WOVEN STRL DISP B (DISPOSABLE) ×3 IMPLANT
TRAY HUM SHOULDER STD +6 (Shoulder) ×3 IMPLANT
WATER STERILE IRR 1000ML POUR (IV SOLUTION) ×6 IMPLANT

## 2020-06-04 NOTE — Transfer of Care (Signed)
Immediate Anesthesia Transfer of Care Note  Patient: Meredith Moore  Procedure(s) Performed: REVERSE SHOULDER ARTHROPLASTY (Right Shoulder)  Patient Location: PACU  Anesthesia Type:GA combined with regional for post-op pain  Level of Consciousness: awake, alert , oriented and patient cooperative  Airway & Oxygen Therapy: Patient Spontanous Breathing and Patient connected to face mask  Post-op Assessment: Report given to RN and Post -op Vital signs reviewed and stable  Post vital signs: Reviewed and stable  Last Vitals:  Vitals Value Taken Time  BP    Temp    Pulse 62 06/04/20 0945  Resp 10 06/04/20 0945  SpO2 100 % 06/04/20 0945  Vitals shown include unvalidated device data.  Last Pain:  Vitals:   06/04/20 0643  TempSrc: Oral  PainSc:          Complications: No complications documented.

## 2020-06-04 NOTE — Anesthesia Procedure Notes (Signed)
Procedure Name: Intubation Date/Time: 06/04/2020 7:47 AM Performed by: Claudia Desanctis, CRNA Pre-anesthesia Checklist: Patient identified, Emergency Drugs available, Suction available and Patient being monitored Patient Re-evaluated:Patient Re-evaluated prior to induction Oxygen Delivery Method: Circle system utilized Preoxygenation: Pre-oxygenation with 100% oxygen Induction Type: IV induction Ventilation: Mask ventilation without difficulty Laryngoscope Size: 2 and Miller Grade View: Grade I Tube type: Oral Tube size: 7.0 mm Number of attempts: 1 Airway Equipment and Method: Stylet Placement Confirmation: ETT inserted through vocal cords under direct vision,  positive ETCO2 and breath sounds checked- equal and bilateral Secured at: 21 cm Tube secured with: Tape Dental Injury: Teeth and Oropharynx as per pre-operative assessment

## 2020-06-04 NOTE — H&P (Signed)
Meredith Moore    Chief Complaint: Right shoulder rotator cuff tear athropathy HPI: The patient is a 84 y.o. female with chronic and progressively increasing right shoulder pain related to advanced rotator cuff tear arthropathy.  Due to her increasing functional limitations and failure to respond to conservative management, she is brought to the operating room this time for planned right shoulder reverse arthroplasty  Past Medical History:  Diagnosis Date  . Arthritis   . Blind 10/12/2016   Has learned Braille  . C. difficile colitis   . Complication of anesthesia    "Blood pressure has gone too low before and effected vision" just with general  . Complication of anesthesia    "woke up in severe panic attack" just with general  . Glaucoma   . Herpes zoster with unspecified complication   . Hip dysplasia    right side  . History of atrial fibrillation    RVR  . History of sinus tachycardia   . History of transfusion 2014   2 units, no reaction-after knee replacement  . Hyperlipidemia    under control  . Iron deficiency anemia   . Macular degeneration   . Migraine    hx of   . Osteoarthrosis, unspecified whether generalized or localized, unspecified site   . Right hip pain    hx     Past Surgical History:  Procedure Laterality Date  . APPENDECTOMY  1979  . BELPHAROPTOSIS REPAIR     eye lid drooping repair  . COLONOSCOPY    . ovarian tumor   1979   benign tumor removed   . REVERSE SHOULDER ARTHROPLASTY Left 12/21/2017   Procedure: LEFT REVERSE SHOULDER ARTHROPLASTY;  Surgeon: Justice Britain, MD;  Location: [place];  Service: Orthopedics;  Laterality: Left;  . ROBOTIC ASSISTED TOTAL HYSTERECTOMY WITH BILATERAL SALPINGO OOPHERECTOMY Bilateral 03/25/2014   Procedure: ROBOTIC ASSISTED TOTAL HYSTERECTOMY WITH BILATERAL SALPINGO OOPHORECTOMY;  Surgeon: Everitt Amber, MD;  Location: [facility];  Service: Gynecology;  Laterality: Bilateral;  . TONSILLECTOMY     1945   . TOTAL HIP  ARTHROPLASTY Right 06/04/2014   Procedure: RIGHT TOTAL HIP ARTHROPLASTY ANTERIOR APPROACH;  Surgeon: Gearlean Alf, MD;  Location: [facility];  Service: Orthopedics;  Laterality: Right;  . TOTAL HIP ARTHROPLASTY Left 05/10/2017   Procedure: LEFT TOTAL HIP ARTHROPLASTY ANTERIOR APPROACH;  Surgeon: Gaynelle Arabian, MD;  Location: [facility];  Service: Orthopedics;  Laterality: Left;  90 mins  . TOTAL KNEE ARTHROPLASTY Right 09/10/2012   Procedure: RIGHT TOTAL KNEE ARTHROPLASTY;  Surgeon: Gearlean Alf, MD;  Location: [facility];  Service: Orthopedics;  Laterality: Right;  . TOTAL KNEE ARTHROPLASTY Left 02/25/2019   Procedure: TOTAL KNEE ARTHROPLASTY;  Surgeon: Gaynelle Arabian, MD;  Location: [facility];  Service: Orthopedics;  Laterality: Left;  85min    Family History  Problem Relation Age of Onset  . Heart disease Mother   . Heart disease Father     Social History:  reports that she quit smoking about 31 years ago. She has a 3.75 pack-year smoking history. She has never used smokeless tobacco. She reports that she does not drink alcohol and does not use drugs.   Medications Prior to Admission  Medication Sig Dispense Refill  . acetaminophen (TYLENOL) 650 MG CR tablet Take 1,300 mg by mouth every 8 (eight) hours as needed for pain.    [name] aspirin EC 81 MG tablet Take 81 mg by mouth daily. Swallow whole.    [name] atorvastatin (LIPITOR) 10 MG  tablet Take 5 mg by mouth at bedtime.     . Calcium Carb-Cholecalciferol (CALCIUM+D3 PO) Take 1 tablet by mouth daily.    . Cholecalciferol (VITAMIN D3) 50 MCG (2000 UT) TABS Take 2,000 Units by mouth daily.     . ferrous sulfate 324 MG TBEC Take 324 mg by mouth daily with breakfast.    . latanoprost (XALATAN) 0.005 % ophthalmic solution Place 1 drop into both eyes at bedtime.     . LUTEIN PO Take 1 tablet by mouth daily.    . metoprolol tartrate (LOPRESSOR) 25 MG tablet Take 1/2 (one-half) tablet by mouth twice daily (Patient taking differently: Take 12.5 mg by mouth 2  (two) times daily at 10 AM and 5 PM. ) 90 tablet 3  . Multiple Vitamins-Minerals (PRESERVISION AREDS 2+MULTI VIT) CAPS Take 1 tablet by mouth 2 (two) times daily.    . QUEtiapine (SEROQUEL) 25 MG tablet Take 12.5 mg by mouth at bedtime.    [name] SIMBRINZA 1-0.2 % SUSP Place 1 drop into both eyes 3 (three) times daily with meals.     . timolol (TIMOPTIC) 0.5 % ophthalmic solution Place 1 drop into both eyes 2 (two) times daily.     . meclizine (ANTIVERT) 12.5 MG tablet Take one tablet by mouth up to four times daily as needed for vertigo (Patient taking differently: Take 12.5 mg by mouth 4 (four) times daily as needed (vertigo). ) 30 tablet 5  . methocarbamol (ROBAXIN) 500 MG tablet Take 1 tablet (500 mg total) by mouth every 6 (six) hours as needed for muscle spasms. (Patient not taking: Reported on 05/21/2020) 40 tablet 0  . oxyCODONE (OXY IR/ROXICODONE) 5 MG immediate release tablet Take 1-2 tablets (5-10 mg total) by mouth every 6 (six) hours as needed. (Patient not taking: Reported on 05/21/2020) 56 tablet 0     Physical Exam: Right shoulder demonstrates painful and guarded motion with global weakness as noted at her previous office visits.  Plain radiographs confirm changes consistent with chronic rotator cuff tear arthropathy.  Vitals  Temp:  [98.3 F (36.8 C)] 98.3 F (36.8 C) (11/04 0643) Pulse Rate:  [61] 61 (11/04 0643) Resp:  [14] 14 (11/04 0643) BP: (141)/(69) 141/69 (11/04 0643) SpO2:  [100 %] 100 % (11/04 0643) Weight:  [59.9 kg] 59.9 kg (11/04 0605)  Assessment/Plan  Impression: Right shoulder rotator cuff tear athropathy  Plan of Action: Procedure(s): REVERSE SHOULDER ARTHROPLASTY  Toma Erichsen M Margo Lama 06/04/2020, 7:10 AM Contact # [phone]

## 2020-06-04 NOTE — Anesthesia Postprocedure Evaluation (Signed)
Anesthesia Post Note  Patient: Meredith Moore  Procedure(s) Performed: REVERSE SHOULDER ARTHROPLASTY (Right Shoulder)     Patient location during evaluation: PACU Anesthesia Type: Regional and General Level of consciousness: awake and alert, oriented and patient cooperative Pain management: pain level controlled Vital Signs Assessment: post-procedure vital signs reviewed and stable Respiratory status: spontaneous breathing, nonlabored ventilation and respiratory function stable Cardiovascular status: blood pressure returned to baseline and stable Postop Assessment: no apparent nausea or vomiting Anesthetic complications: no   No complications documented.  Last Vitals:  Vitals:   06/04/20 1045 06/04/20 1100  BP: 130/69 131/67  Pulse: (!) 53 (!) 52  Resp: 12 16  Temp:  36.4 C  SpO2: 95% 96%    Last Pain:  Vitals:   06/04/20 1100  TempSrc:   PainSc: 0-No pain                 Pervis Hocking

## 2020-06-04 NOTE — Op Note (Signed)
06/04/2020  9:32 AM  PATIENT:   Meredith Moore  84 y.o. female  PRE-OPERATIVE DIAGNOSIS:  Right shoulder rotator cuff tear athropathy  POST-OPERATIVE DIAGNOSIS: Same  PROCEDURE: Right shoulder reverse arthroplasty utilizing a Biomet size 11 micro stem with a +3 polyethylene insert on a standard tray with a 36 glenosphere on a standard baseplate.  SURGEON:  Marin Shutter M.D.  ASSISTANTS: Jenetta Loges, PA-C  ANESTHESIA:   General endotracheal and interscalene block with Exparel  EBL: 150 cc  SPECIMEN: None  Drains: None   PATIENT DISPOSITION:  PACU - hemodynamically stable.    PLAN OF CARE: Discharge to home after PACU  Brief history:  Meredith Moore is an 84 year old female with a long history of right shoulder pain related to severe rotator cuff tear arthropathy.  We had performed a previous left shoulder reverse arthroplasty several years ago and she has done extremely well.  She is brought to the operating this time for planned right shoulder reverse arthroplasty.  Preoperatively, I counseled the patient regarding treatment options and risks versus benefits thereof.  Possible surgical complications were all reviewed including potential for bleeding, infection, neurovascular injury, persistent pain, loss of motion, anesthetic complication, failure of the implant, and possible need for additional surgery. They understand and accept and agrees with our planned procedure.   Procedure in detail:  After undergoing routine preop evaluation the patient received prophylactic antibiotics and interscalene block with Exparel was established in the holding area by the anesthesia department.  Patient was subsequently placed supine on the operating table and underwent the smooth induction of a general endotracheal anesthesia.  Placed into the beachchair position and appropriate padding protected.  The right shoulder girdle region was sterilely prepped and draped in standard fashion.   Timeout was called.  An anterior deltopectoral approach to the right shoulder was made through an approximate centimeter incision.  Skin flaps were elevated dissection carried deeply and the deltopectoral interval was then developed from proximal to distal with the vein taken laterally.  The conjoined tendon was then mobilized and retracted medially in the upper centimeter the pectoralis major was tenotomized for improved exposure and adhesions were then divided from beneath the deltoid.  The remnant of the subscapularis was separated from the lesser tuberosity and tagged and reflected medially and capsular attachments from the anterior infra margins of the humeral neck were then divided in a subperiosteal fashion.  The humeral head was then delivered through the wound and extra medullary guide was then used to outline a proposed humeral head resection which was performed at approximately 30 degrees of retroversion.  A metal cap was then placed over the cut proximal humeral surface.  At this point we exposed the glenoid with appropriate retractors and performed a circumferential labral resection.  A guidepin was then directed into the center of the glenoid with an approximately 10 degree inferior tilt and the glenoid was then reamed with the standard reamer.  Our baseplate was then impacted in the central lag screw had a 25 mm length was inserted with excellent fit fixation.  The peripheral locking screws were all then placed with good fit and fixation.  A 36 glenosphere was then impacted onto the baseplate and stability was confirmed.  We then returned our attention back to the proximal humerus where the canal was opened hand reaming initially to a size 12 but ultimately a size 12 stem did not see completely.  We went back to a size 11 and on initial sizing  we were unable to get a reduction and so we went back and removed an additional 2 mm of bone from the cortical margin of the metaphysis and then rereamed and  broached for size 11 and with this we were able to achieve excellent reduction and stability and soft tissue balance.  Our final stem was then impacted into the canal.  We did pass 2 sutures through cortical drill holes to allow repair of the subscapularis.  The stem was then inserted we then again performed a trial reduction which showed good stability good soft tissue balance good motion.  Our final tray and polyethylene insert was then assembled and impacted onto the stem.  Final reduction was performed with good soft tissue balance good motion good stability.  The wound was copiously irrigated.  Final hemostasis was obtained.  The subscapularis was mobilized and repaired back using the sutures placed through the bone tunnels on the humeral metaphysis.  The deltopectoral interval was reapproximated with a series of figure-of-eight and 1 Vicryl sutures.  2-0 Vicryl used for subcu layer and intracuticular 3-0 Monocryl for the skin followed by Dermabond and Aquacel dressing.  Right arm was then placed into a sling and the patient was awakened, extubated, and taken to the recovery room in stable condition.  Jenetta Loges, PA-C was utilized as an Environmental consultant throughout this case, essential for help with positioning the patient, positioning extremity, tissue manipulation, implantation of the prosthesis, suture management, wound closure, and intraoperative decision-making.  Marin Shutter MD   Contact # [phone]

## 2020-06-04 NOTE — Evaluation (Addendum)
Occupational Therapy Evaluation Patient Details Name: Meredith Moore MRN: [id number] DOB: June 02, 1936 Today's Date: 06/04/2020    History of Present Illness S/p R TSA.   Clinical Impression   [name] is an 84 year old woman s/p shoulder replacement without functional use of right dominant upper extremity secondary to effects of surgery, interscalene block and shoulder precautions. Therapist provided education and instruction to patient and spouse in regards to exercises, precautions, positioning, donning upper extremity clothing and bathing while maintaining shoulder precautions, ice and edema management and donning/doffing sling. Patient and spouse verbalized understanding and demonstrated as needed. Patient needed assistance to donn shirt and sling and provided with instruction. Patient to follow up with MD for further therapy needs.      Follow Up Recommendations  Follow surgeon's recommendation for DC plan and follow-up therapies    Equipment Recommendations  None recommended by OT    Recommendations for Other Services       Precautions / Restrictions Precautions Precaution Comments: If sitting in controlled environment, ok to come out of sling to give neck a break. Please sleep in it to protect until follow up in office. OK to use operative arm for feeding, hygiene and ADLs. Ok to instruct Pendulums and lap slides as exercises. Ok to use operative arm within the following parameters for ADL purposes New ROM (8/18) Ok for PROM, AAROM, AROM within pain tolerance and within the following ROM ER 20 ABD 45 FE 60 Required Braces or Orthoses: Sling Restrictions Weight Bearing Restrictions: Yes RUE Weight Bearing: Non weight bearing      Mobility Bed Mobility                    Transfers Overall transfer level: Needs assistance   Transfers: Sit to/from Stand;Stand Pivot Transfers Sit to Stand: Min guard Stand pivot transfers: Min guard             Balance Overall balance assessment: No apparent balance deficits (not formally assessed)                                         ADL either performed or assessed with clinical judgement   ADL Overall ADL's : Needs assistance/impaired Eating/Feeding: Set up   Grooming: Modified independent   Upper Body Bathing: Minimal assistance;Adhering to UE precautions   Lower Body Bathing: Set up;Sit to/from stand   Upper Body Dressing : Moderate assistance;Adhering to UE precautions   Lower Body Dressing: Set up;Sit to/from stand   Toilet Transfer: Designer, fashion/clothing and Hygiene: Min guard               Vision Baseline Vision/History: Glaucoma Patient Visual Report: No change from baseline       Perception     Praxis      Pertinent Vitals/Pain Pain Assessment: No/denies pain     Hand Dominance Right   Extremity/Trunk Assessment Upper Extremity Assessment Upper Extremity Assessment: LUE deficits/detail;RUE deficits/detail RUE Deficits / Details: Non functional use of RUE due to block, no active ROM or sensation at this time. LUE Deficits / Details: WFL ROM/Strength LUE Sensation: WNL LUE Coordination: WNL           Communication Communication Communication: No difficulties   Cognition Arousal/Alertness: Awake/alert Behavior During Therapy: WFL for tasks assessed/performed Overall Cognitive Status: Within Functional Limits for tasks assessed  General Comments       Exercises     Shoulder Instructions Shoulder Instructions Donning/doffing shirt without moving shoulder: Moderate assistance;Patient able to independently direct caregiver;Caregiver independent with task Method for sponge bathing under operated UE: Independent Donning/doffing sling/immobilizer: Independent;Patient able to independently direct caregiver Correct positioning of sling/immobilizer:  Independent;Patient able to independently direct caregiver Pendulum exercises (written home exercise program): Caregiver independent with task ROM for elbow, wrist and digits of operated UE: Caregiver independent with task Sling wearing schedule (on at all times/off for ADL's): Independent Proper positioning of operated UE when showering: Independent Dressing change: Independent Positioning of UE while sleeping: [place] expects to be discharged to:: Private residence Living Arrangements: Spouse/significant other Available Help at Discharge: Available 24 hours/day;Family Type of Home: House Home Access: Stairs to enter Technical brewer of Steps: 4   Home Layout: Two level Alternate Level Stairs-Number of Steps: flight Alternate Level Stairs-Rails: Right Bathroom Shower/Tub: Occupational psychologist: Handicapped height                Prior Functioning/Environment Level of Independence: Independent                 OT Problem List: Impaired UE functional use      OT Treatment/Interventions:      OT Goals(Current goals can be found in the care plan section) Acute Rehab OT Goals Patient Stated Goal: To go home OT Goal Formulation: All assessment and education complete, DC therapy  OT Frequency:     Barriers to D/C:            Co-evaluation              AM-PAC OT "6 Clicks" Daily Activity     Outcome Measure Help from another person eating meals?: A Little Help from another person taking care of personal grooming?: A Little Help from another person toileting, which includes using toliet, bedpan, or urinal?: A Little Help from another person bathing (including washing, rinsing, drying)?: A Little Help from another person to put on and taking off regular upper body clothing?: A Lot Help from another person to put on and taking off regular lower body clothing?: A Little 6 Click Score: 17   End of Session  Equipment Utilized During Treatment: Other (comment) (sling) Nurse Communication:  (OT education complete)  Activity Tolerance: Patient tolerated treatment well Patient left: in chair;with family/visitor present;with call bell/phone within reach  OT Visit Diagnosis: Muscle weakness (generalized) (M62.81)                Time: 6734-1937 OT Time Calculation (min): 35 min Charges:  OT General Charges $OT Visit: 1 Visit OT Evaluation $OT Eval Low Complexity: 1 Low OT Treatments $Self Care/Home Management : 8-22 mins  Meredith Moore, OTR/L Acute Care Rehab Services  Office [phone] Pager: [phone]   Lenward Chancellor 06/04/2020, 1:12 PM

## 2020-06-04 NOTE — Anesthesia Procedure Notes (Signed)
Anesthesia Regional Block: Interscalene brachial plexus block   Pre-Anesthetic Checklist: ,, timeout performed, Correct Patient, Correct Site, Correct Laterality, Correct Procedure, Correct Position, site marked, Risks and benefits discussed,  Surgical consent,  Pre-op evaluation,  At surgeon's request and post-op pain management  Laterality: Right  Prep: Maximum Sterile Barrier Precautions used, chloraprep       Needles:  Injection technique: Single-shot  Needle Type: Echogenic Stimulator Needle     Needle Length: 9cm  Needle Gauge: 22     Additional Needles:   Procedures:,,,, ultrasound used (permanent image in chart),,,,  Narrative:  Start time: 06/04/2020 7:10 AM End time: 06/04/2020 7:15 AM Injection made incrementally with aspirations every 5 mL.  Performed by: Personally  Anesthesiologist: Pervis Hocking, DO  Additional Notes: Monitors applied. No increased pain on injection. No increased resistance to injection. Injection made in 5cc increments. Good needle visualization. Patient tolerated procedure well.

## 2020-06-04 NOTE — Discharge Instructions (Signed)
 Meredith Moore, M.D., F.A.A.O.S. Orthopaedic Surgery Specializing in Arthroscopic and Reconstructive Surgery of the Shoulder [phone] [address] - [place], [place] [address] - Fax [phone]   POST-OP TOTAL SHOULDER REPLACEMENT INSTRUCTIONS  1. Follow up in the office for your first post-op appointment 10-14 days from the date of your surgery. If you do not already have a scheduled appointment, our office will contact you to schedule.  2. The bandage over your incision is waterproof. You may begin showering with this dressing on. You may leave this dressing on until first follow up appointment within 2 weeks. We prefer you leave this dressing in place until follow up however after 5-7 days if you are having itching or skin irritation and would like to remove it you may do so. Go slow and tug at the borders gently to break the bond the dressing has with the skin. At this point if there is no drainage it is okay to go without a bandage or you may cover it with a light guaze and tape. You can also expect significant bruising around your shoulder that will drift down your arm and into your chest wall. This is very normal and should resolve over several days.   3. Wear your sling/immobilizer at all times except to perform the exercises below or to occasionally let your arm dangle by your side to stretch your elbow. You also need to sleep in your sling immobilizer until instructed otherwise. It is ok to remove your sling if you are sitting in a controlled environment and allow your arm to rest in a position of comfort by your side or on your lap with pillows to give your neck and skin a break from the sling. You may remove it to allow arm to dangle by side to shower. If you are up walking around and when you go to sleep at night you need to wear it.  4. Range of motion to your elbow, wrist, and hand are encouraged 3-5 times daily. Exercise to your hand and fingers helps to reduce  swelling you may experience.   5. Prescriptions for a pain medication and a muscle relaxant are provided for you. It is recommended that if you are experiencing pain that you pain medication alone is not controlling, add the muscle relaxant along with the pain medication which can give additional pain relief. The first 1-2 days is generally the most severe of your pain and then should gradually decrease. As your pain lessens it is recommended that you decrease your use of the pain medications to an "as needed basis'" only and to always comply with the recommended dosages of the pain medications.  6. Pain medications can produce constipation along with their use. If you experience this, the use of an over the counter stool softener or laxative daily is recommended.   7. For additional questions or concerns, please do not hesitate to call the office. If after hours there is an answering service to forward your concerns to the physician on call.  8.Pain control following an exparel block  To help control your post-operative pain you received a nerve block  performed with Exparel which is a long acting anesthetic (numbing agent) which can provide pain relief and sensations of numbness (and relief of pain) in the operative shoulder and arm for up to 3 days. Sometimes it provides mixed relief, meaning you may still have numbness in certain areas of the arm but can still be able to   move  parts of that arm, hand, and fingers. We recommend that your prescribed pain medications  be used as needed. We do not feel it is necessary to "pre medicate" and "stay ahead" of pain.  Taking narcotic pain medications when you are not having any pain can lead to unnecessary and potentially dangerous side effects.    9. Use the ice machine as much as possible in the first 5-7 days from surgery, then you can wean its use to as needed. The ice typically needs to be replaced every 6 hours, instead of ice you can actually freeze  water bottles to put in the cooler and then fill water around them to avoid having to purchase ice. You can have spare water bottles freezing to allow you to rotate them once they have melted. Try to have a thin shirt or light cloth or towel under the ice wrap to protect your skin.   10.  We recommend that you avoid any dental work or cleaning in the first 3 months following your joint replacement. This is to help minimize the possibility of infection from the bacteria in your mouth that enters your bloodstream during dental work. We also recommend that you take an antibiotic prior to your dental work for the first year after your shoulder replacement to further help reduce that risk. Please simply contact our office for antibiotics to be sent to your pharmacy prior to dental work.  11. Dental Antibiotics:  In most cases prophylactic antibiotics for Dental procdeures after total joint surgery are not necessary.  Exceptions are as follows:  1. History of prior total joint infection  2. Severely immunocompromised (Organ Transplant, cancer chemotherapy, Rheumatoid biologic meds such as Humera)  3. Poorly controlled diabetes (A1C &gt; 8.0, blood glucose over 200)  If you have one of these conditions, contact your surgeon for an antibiotic prescription, prior to your dental procedure.   POST-OP EXERCISES  Pendulum Exercises  Perform pendulum exercises while standing and bending at the waist. Support your uninvolved arm on a table or chair and allow your operated arm to hang freely. Make sure to do these exercises passively - not using you shoulder muscles. These exercises can be performed once your nerve block effects have worn off.  Repeat 20 times. Do 3 sessions per day.     

## 2020-06-05 ENCOUNTER — Encounter (HOSPITAL_COMMUNITY): Payer: Self-pay | Admitting: Orthopedic Surgery

## 2020-08-06 ENCOUNTER — Ambulatory Visit (INDEPENDENT_AMBULATORY_CARE_PROVIDER_SITE_OTHER): Payer: Medicare Other | Admitting: Ophthalmology

## 2020-08-06 ENCOUNTER — Encounter (INDEPENDENT_AMBULATORY_CARE_PROVIDER_SITE_OTHER): Payer: Self-pay | Admitting: Ophthalmology

## 2020-08-06 ENCOUNTER — Other Ambulatory Visit: Payer: Self-pay

## 2020-08-06 DIAGNOSIS — H401133 Primary open-angle glaucoma, bilateral, severe stage: Secondary | ICD-10-CM | POA: Insufficient documentation

## 2020-08-06 DIAGNOSIS — H35363 Drusen (degenerative) of macula, bilateral: Secondary | ICD-10-CM | POA: Insufficient documentation

## 2020-08-06 DIAGNOSIS — H353134 Nonexudative age-related macular degeneration, bilateral, advanced atrophic with subfoveal involvement: Secondary | ICD-10-CM

## 2020-08-06 DIAGNOSIS — H4720 Unspecified optic atrophy: Secondary | ICD-10-CM | POA: Insufficient documentation

## 2020-08-06 NOTE — Progress Notes (Signed)
08/06/2020     CHIEF COMPLAINT Patient presents for Retina Follow Up (1 Year AMD F/U OU//Pt sts [facility] OU is getting foggier. No other new symptoms reported OU.)   HISTORY OF PRESENT ILLNESS: Meredith Moore is a 85 y.o. female who presents to the clinic today for:   HPI    Retina Follow Up    Patient presents with  Dry AMD.  In both eyes.  This started 1 year ago.  Severity is moderate.  Duration of 1 year.  Since onset it is gradually worsening. Additional comments: 1 Year AMD F/U OU  Pt sts [facility] OU is getting foggier. No other new symptoms reported OU.       Last edited by Ileana Roup, COA on 08/06/2020  1:32 PM. (History)      Referring physician: Soundra Pilon, FNP [address] [place],  [place] [address]  HISTORICAL INFORMATION:   Selected notes from the MEDICAL RECORD NUMBER       CURRENT MEDICATIONS: Current Outpatient Medications (Ophthalmic Drugs)  Medication Sig  . latanoprost (XALATAN) 0.005 % ophthalmic solution Place 1 drop into both eyes at bedtime.   [name] SIMBRINZA 1-0.2 % SUSP Place 1 drop into both eyes 3 (three) times daily with meals.   . timolol (TIMOPTIC) 0.5 % ophthalmic solution Place 1 drop into both eyes 2 (two) times daily.    No current facility-administered medications for this visit. (Ophthalmic Drugs)   Current Outpatient Medications (Other)  Medication Sig  . acetaminophen (TYLENOL) 650 MG CR tablet Take 1,300 mg by mouth every 8 (eight) hours as needed for pain.  [name] aspirin EC 81 MG tablet Take 81 mg by mouth daily. Swallow whole.  [name] atorvastatin (LIPITOR) 10 MG tablet Take 5 mg by mouth at bedtime.   . Calcium Carb-Cholecalciferol (CALCIUM+D3 PO) Take 1 tablet by mouth daily.  . Cholecalciferol (VITAMIN D3) 50 MCG (2000 UT) TABS Take 2,000 Units by mouth daily.   . ferrous sulfate 324 MG TBEC Take 324 mg by mouth daily with breakfast.  . LUTEIN PO Take 1 tablet by mouth daily.  . meclizine (ANTIVERT) 12.5 MG tablet Take one tablet by  mouth up to four times daily as needed for vertigo (Patient taking differently: Take 12.5 mg by mouth 4 (four) times daily as needed (vertigo). )  . methocarbamol (ROBAXIN) 500 MG tablet Take 1 tablet (500 mg total) by mouth 3 (three) times daily as needed for muscle spasms.  . metoprolol tartrate (LOPRESSOR) 25 MG tablet Take 1/2 (one-half) tablet by mouth twice daily (Patient taking differently: Take 12.5 mg by mouth 2 (two) times daily at 10 AM and 5 PM. )  . Multiple Vitamins-Minerals (PRESERVISION AREDS 2+MULTI VIT) CAPS Take 1 tablet by mouth 2 (two) times daily.  . ondansetron (ZOFRAN) 4 MG tablet Take 1 tablet (4 mg total) by mouth every 8 (eight) hours as needed for nausea or vomiting.  [name] oxyCODONE-acetaminophen (PERCOCET) 5-325 MG tablet Take 1 tablet by mouth every 4 (four) hours as needed (max 6 q).  . QUEtiapine (SEROQUEL) 25 MG tablet Take 12.5 mg by mouth at bedtime.   No current facility-administered medications for this visit. (Other)      REVIEW OF SYSTEMS:    ALLERGIES Allergies  Allergen Reactions  . Temazepam   . Tramadol     Fine when taking it- did not sleep for 6 weeks after stopping medication  . Formaldehyde Rash  . Nickel Rash    PAST  MEDICAL HISTORY Past Medical History:  Diagnosis Date  . Arthritis   . Blind 10/12/2016   Has learned Braille  . C. difficile colitis   . Complication of anesthesia    "Blood pressure has gone too low before and effected vision" just with general  . Complication of anesthesia    "woke up in severe panic attack" just with general  . Glaucoma   . Herpes zoster with unspecified complication   . Hip dysplasia    right side  . History of atrial fibrillation    RVR  . History of sinus tachycardia   . History of transfusion 2014   2 units, no reaction-after knee replacement  . Hyperlipidemia    under control  . Iron deficiency anemia   . Macular degeneration   . Migraine    hx of   . Osteoarthrosis, unspecified  whether generalized or localized, unspecified site   . Right hip pain    hx    Past Surgical History:  Procedure Laterality Date  . APPENDECTOMY  1979  . BELPHAROPTOSIS REPAIR     eye lid drooping repair  . COLONOSCOPY    . ovarian tumor   1979   benign tumor removed   . REVERSE SHOULDER ARTHROPLASTY Left 12/21/2017   Procedure: LEFT REVERSE SHOULDER ARTHROPLASTY;  Surgeon: Justice Britain, MD;  Location: [facility];  Service: Orthopedics;  Laterality: Left;  . REVERSE SHOULDER ARTHROPLASTY Right 06/04/2020   Procedure: REVERSE SHOULDER ARTHROPLASTY;  Surgeon: Justice Britain, MD;  Location: [facility];  Service: Orthopedics;  Laterality: Right;  111min  . ROBOTIC ASSISTED TOTAL HYSTERECTOMY WITH BILATERAL SALPINGO OOPHERECTOMY Bilateral 03/25/2014   Procedure: ROBOTIC ASSISTED TOTAL HYSTERECTOMY WITH BILATERAL SALPINGO OOPHORECTOMY;  Surgeon: Everitt Amber, MD;  Location: [facility];  Service: Gynecology;  Laterality: Bilateral;  . TONSILLECTOMY     1945   . TOTAL HIP ARTHROPLASTY Right 06/04/2014   Procedure: RIGHT TOTAL HIP ARTHROPLASTY ANTERIOR APPROACH;  Surgeon: Gearlean Alf, MD;  Location: [facility];  Service: Orthopedics;  Laterality: Right;  . TOTAL HIP ARTHROPLASTY Left 05/10/2017   Procedure: LEFT TOTAL HIP ARTHROPLASTY ANTERIOR APPROACH;  Surgeon: Gaynelle Arabian, MD;  Location: [facility];  Service: Orthopedics;  Laterality: Left;  90 mins  . TOTAL KNEE ARTHROPLASTY Right 09/10/2012   Procedure: RIGHT TOTAL KNEE ARTHROPLASTY;  Surgeon: Gearlean Alf, MD;  Location: [facility];  Service: Orthopedics;  Laterality: Right;  . TOTAL KNEE ARTHROPLASTY Left 02/25/2019   Procedure: TOTAL KNEE ARTHROPLASTY;  Surgeon: Gaynelle Arabian, MD;  Location: [facility];  Service: Orthopedics;  Laterality: Left;  55min    FAMILY HISTORY Family History  Problem Relation Age of Onset  . Heart disease Mother   . Heart disease Father     SOCIAL HISTORY Social History   Tobacco Use  . Smoking status: Former Smoker     Packs/day: 0.25    Years: 15.00    Pack years: 3.75    Quit date: 08/01/1988    Years since quitting: 32.0  . Smokeless tobacco: Never Used  Vaping Use  . Vaping Use: Never used  Substance Use Topics  . Alcohol use: No  . Drug use: No         OPHTHALMIC EXAM: Base Eye Exam    Visual Acuity (ETDRS)      Right Left   Dist [place] CF at 3' CF at 3'   Dist ph [place] NI NI       Tonometry (Tonopen, 1:32 PM)  Right Left   Pressure 10 13       Pupils      Pupils Dark Light Shape React APD   Right PERRL 5 4 Round Slow Trace   Left PERRL 5 4 Round Slow Trace       Visual Fields (Counting fingers)      Left Right   Restrictions Total superior temporal, superior nasal, inferior nasal deficiencies Total superior temporal, superior nasal, inferior nasal deficiencies       Extraocular Movement      Right Left    Full Full       Neuro/Psych    Oriented x3: Yes   Mood/Affect: Normal       Dilation    Both eyes: 1.0% Mydriacyl, 2.5% Phenylephrine @ 1:36 PM        Slit Lamp and Fundus Exam    External Exam      Right Left   External Normal Normal       Slit Lamp Exam      Right Left   Lids/Lashes Normal Normal   Conjunctiva/Sclera White and quiet White and quiet   Cornea Clear Clear   Anterior Chamber Deep and quiet Deep and quiet   [place]ris Round and reactive Round and reactive   Lens Centered posterior chamber intraocular lens Centered posterior chamber intraocular lens   Anterior Vitreous Normal Normal       Fundus Exam      Right Left   Posterior Vitreous Posterior vitreous detachment Posterior vitreous detachment   Disc Peripapillary atrophy Peripapillary atrophy   C/D Ratio 0.95 0.98   Macula Geographic atrophy 10 da size Geographic atrophy 8 da size   Vessels Normal Normal   Periphery Normal Normal          IMAGING AND PROCEDURES  Imaging and Procedures for 08/06/20  Color Fundus Photography Optos - OU - Both Eyes       Right Eye Progression has  been stable. Disc findings include pallor, increased cup to disc ratio. Macula : geographic atrophy. Vessels : normal observations. Periphery : normal observations.   Left Eye Progression has been stable. Disc findings include increased cup to disc ratio. Macula : geographic atrophy. Vessels : normal observations. Periphery : normal observations.   Notes Geographic dry atrophic ARMD OU, no active edges, no CNVM, media clear.                ASSESSMENT/PLAN:  No problem-specific Assessment & Plan notes found for this encounter.      ICD-10-CM   1. Advanced nonexudative age-related macular degeneration of both eyes with subfoveal involvement  H35.3134 Color Fundus Photography Optos - OU - Both Eyes  2. Primary open angle glaucoma of both eyes, severe stage  H40.1133 Color Fundus Photography Optos - OU - Both Eyes  3. Degenerative retinal drusen of both eyes  H35.363   4. Optic nerve atrophy  H47.20 Color Fundus Photography Optos - OU - Both Eyes    1.  Dry age-related macular degeneration, no active lesions, observe  2.  Glaucoma, under the care of Dr. Maryagnes Amos, Dr. [facility] follow-up as scheduled  3.  Ophthalmic Meds Ordered this visit:  No orders of the defined types were placed in this encounter.      Return in about 1 year (around 08/06/2021) for COLOR FP, OCT, DILATE OU.  There are no Patient Instructions on file for this visit.   Explained the diagnoses, plan, and follow up with  the patient and they expressed understanding.  Patient expressed understanding of the importance of proper follow up care.   Clent Demark Endi Lagman M.D. Diseases & Surgery of the Retina and Vitreous [facility] [place] 08/06/20     Abbreviations: M myopia (nearsighted); A astigmatism; H hyperopia (farsighted); P presbyopia; Mrx spectacle prescription;  CTL contact lenses; OD right eye; OS left eye; OU both eyes  XT exotropia; ET esotropia; PEK punctate epithelial  keratitis; PEE punctate epithelial erosions; DES dry eye syndrome; MGD meibomian gland dysfunction; ATs artificial tears; PFAT's preservative free artificial tears; [place] nuclear sclerotic cataract; PSC posterior subcapsular cataract; ERM epi-retinal membrane; PVD posterior vitreous detachment; RD retinal detachment; DM diabetes mellitus; DR diabetic retinopathy; NPDR non-proliferative diabetic retinopathy; PDR proliferative diabetic retinopathy; CSME clinically significant macular edema; DME diabetic macular edema; dbh dot blot hemorrhages; CWS cotton wool spot; POAG primary open angle glaucoma; C/D cup-to-disc ratio; HVF humphrey visual field; GVF goldmann visual field; OCT optical coherence tomography; IOP intraocular pressure; BRVO Branch retinal vein occlusion; CRVO central retinal vein occlusion; CRAO central retinal artery occlusion; BRAO branch retinal artery occlusion; RT retinal tear; SB scleral buckle; PPV pars plana vitrectomy; VH Vitreous hemorrhage; PRP panretinal laser photocoagulation; IVK intravitreal kenalog; VMT vitreomacular traction; MH Macular hole;  NVD neovascularization of the disc; NVE neovascularization elsewhere; AREDS age related eye disease study; ARMD age related macular degeneration; POAG primary open angle glaucoma; EBMD epithelial/anterior basement membrane dystrophy; ACIOL anterior chamber intraocular lens; IOL intraocular lens; PCIOL posterior chamber intraocular lens; Phaco/IOL phacoemulsification with intraocular lens placement; [place] photorefractive keratectomy; LASIK laser assisted in situ keratomileusis; HTN hypertension; DM diabetes mellitus; COPD chronic obstructive pulmonary disease

## 2020-11-27 IMAGING — CR DG CHEST 2V
2 series · 2 of 2 positions shown · non-contrast
Comparison: 07/22/2009 radiograph

CLINICAL DATA: Tachycardia

EXAM:
CHEST - 2 VIEW

[chest lat]
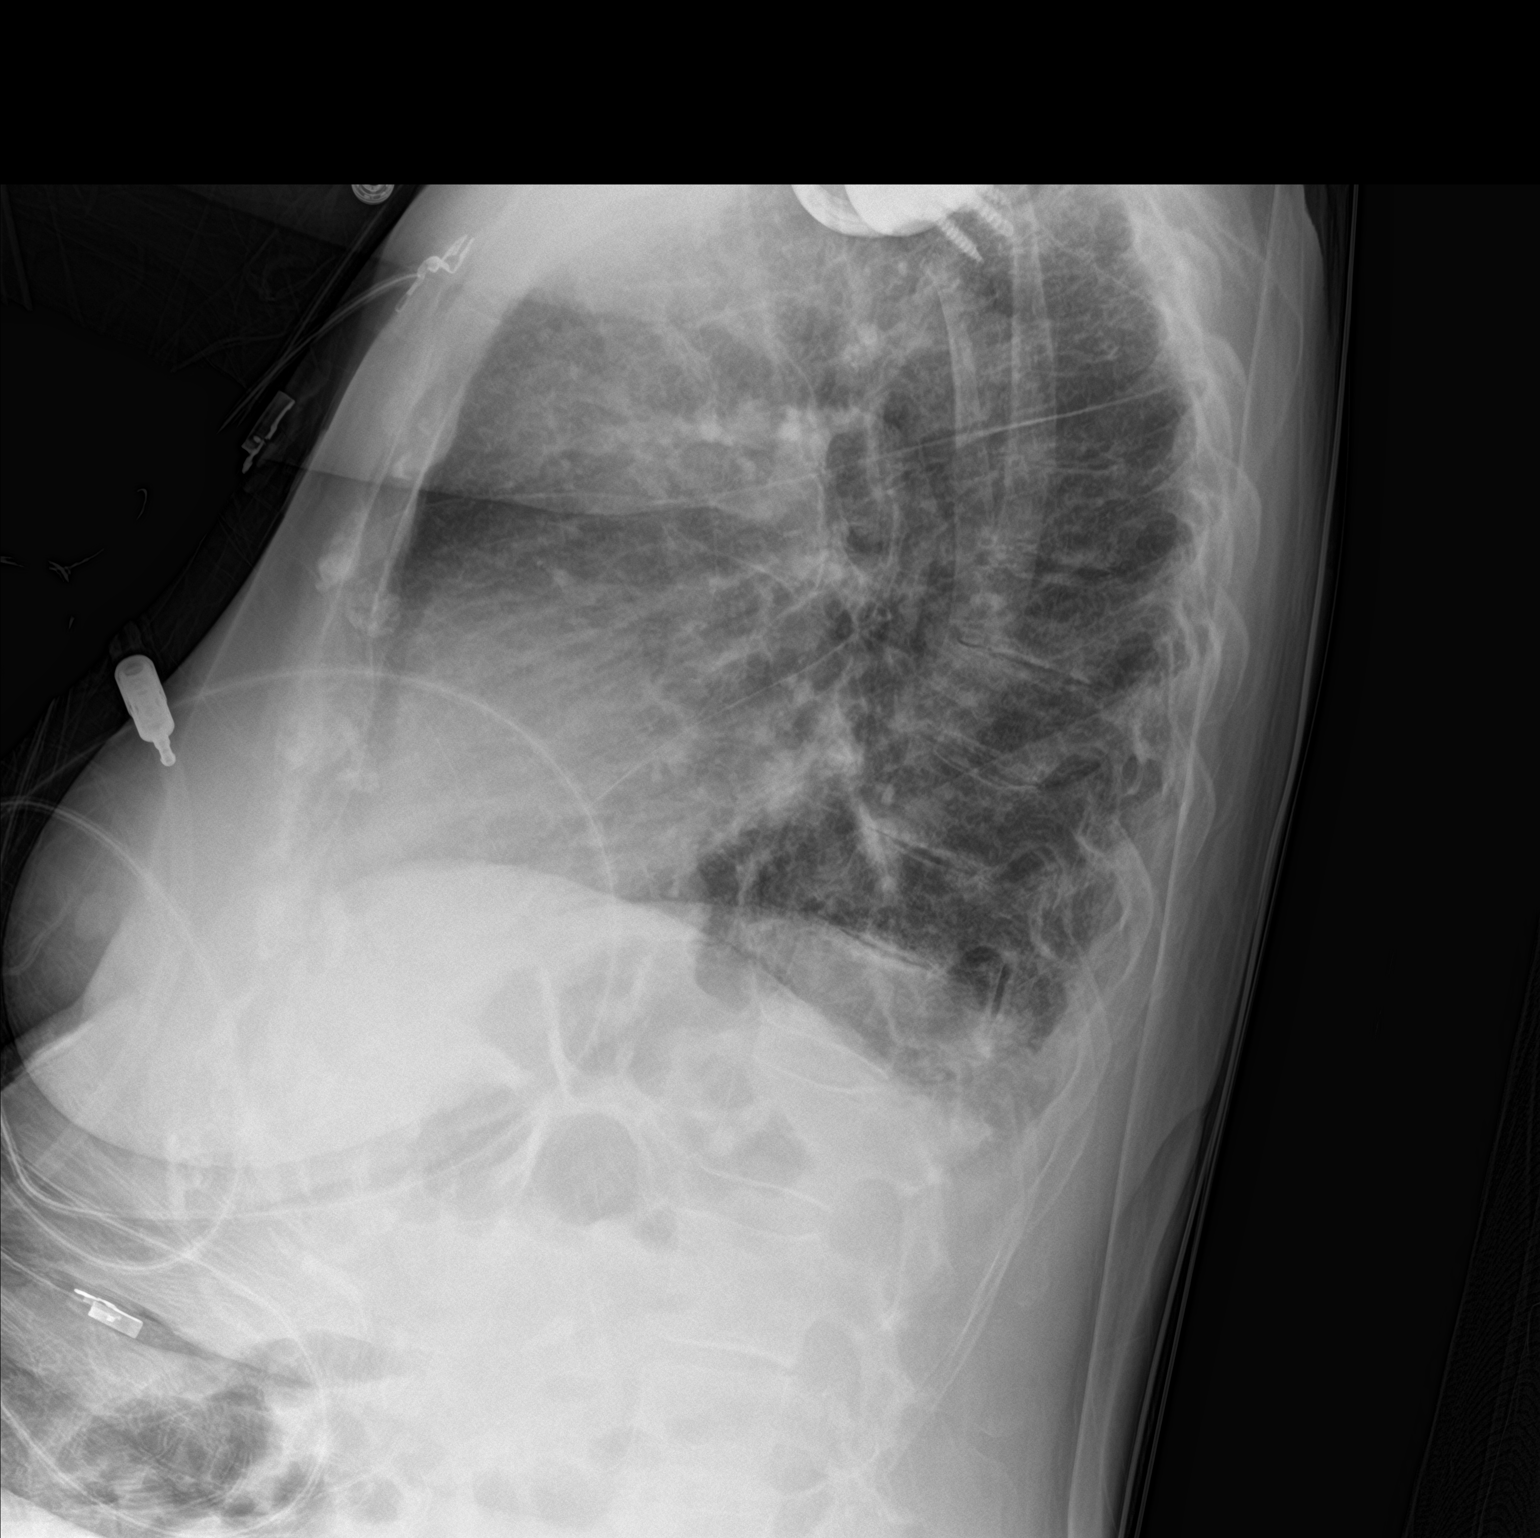

[chest ap]
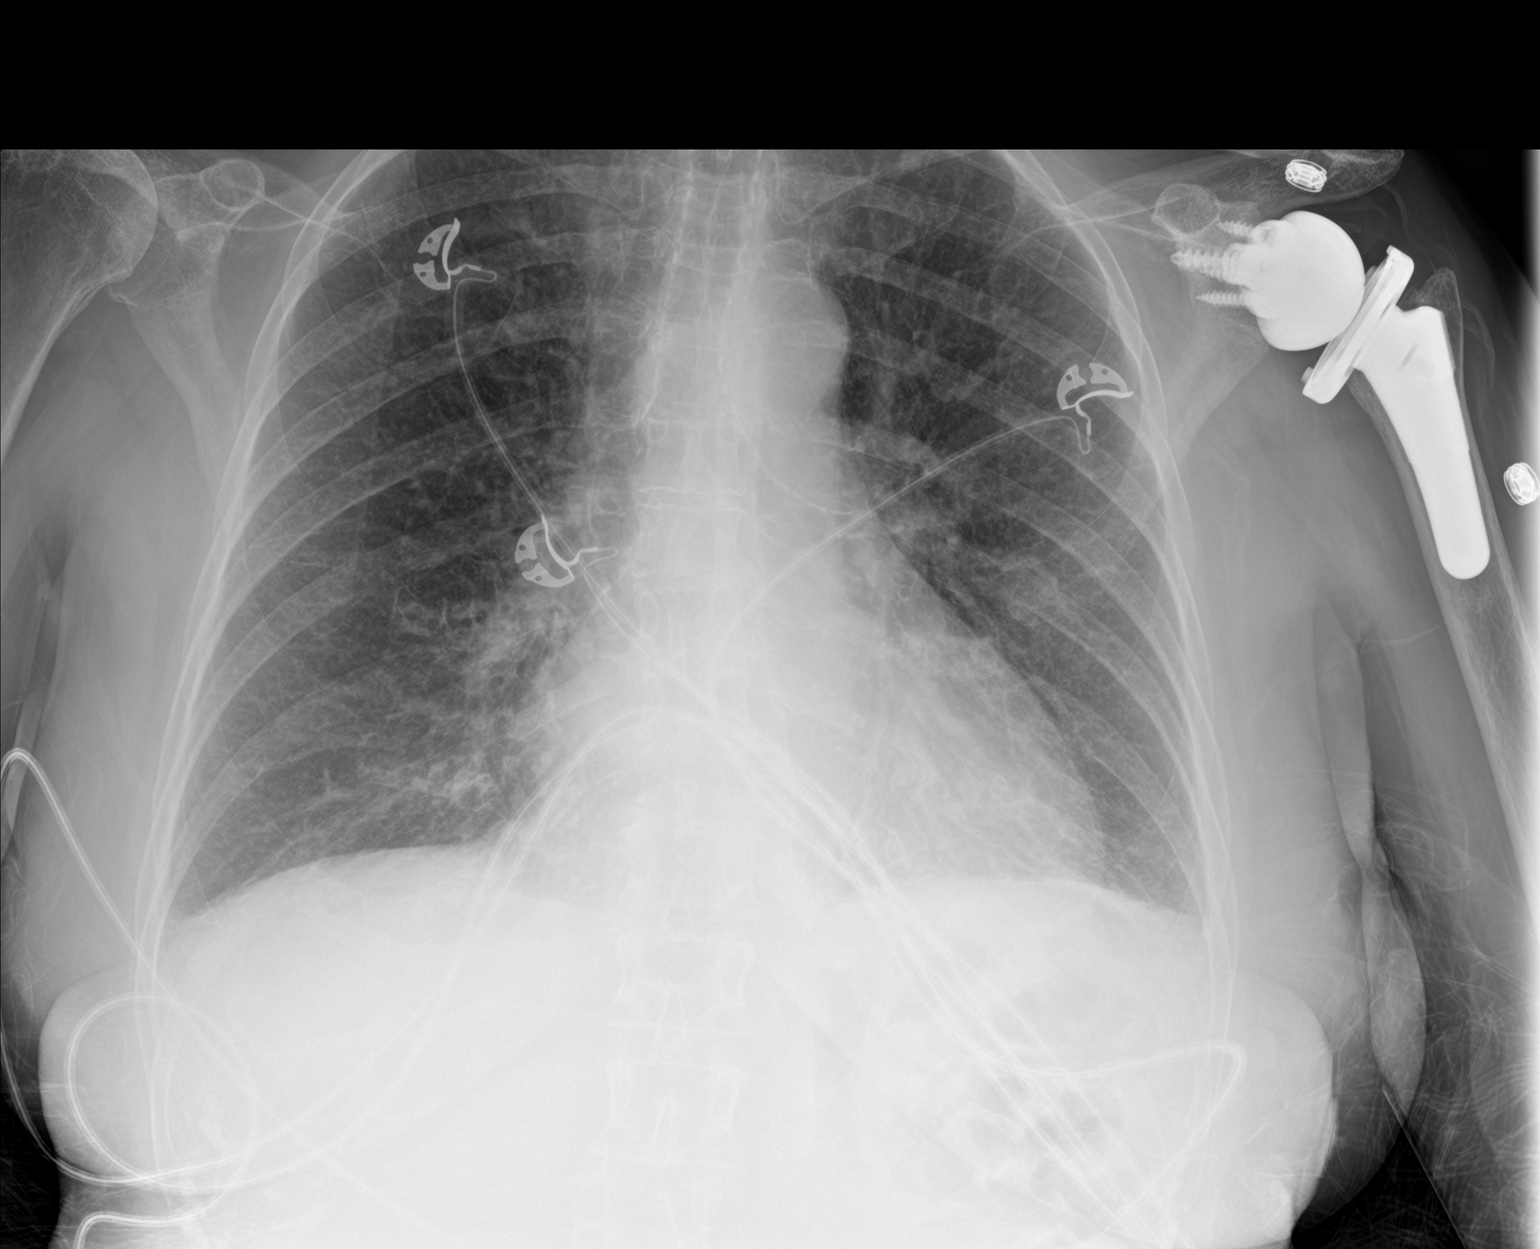

[2 of 2 positions shown; findings below may reference images not displayed]

FINDINGS: UPPER limits normal heart size noted.

Mild pulmonary vascular congestion identified with possible mild
interstitial opacities.

No pneumothorax or definite effusions.

No airspace disease or pulmonary mass noted.

No acute bony abnormalities are present.

LEFT shoulder arthroplasty changes identified.
IMPRESSION: Mild pulmonary vascular congestion with possible mild interstitial
opacities which may represent very mild interstitial edema.

## 2021-04-05 ENCOUNTER — Other Ambulatory Visit: Payer: Self-pay | Admitting: Cardiology

## 2021-05-12 ENCOUNTER — Encounter: Payer: Self-pay | Admitting: Cardiology

## 2021-05-12 ENCOUNTER — Other Ambulatory Visit: Payer: Self-pay

## 2021-05-12 ENCOUNTER — Ambulatory Visit: Payer: Medicare Other | Admitting: Cardiology

## 2021-05-12 DIAGNOSIS — I471 Supraventricular tachycardia: Secondary | ICD-10-CM | POA: Diagnosis not present

## 2021-05-12 DIAGNOSIS — I2729 Other secondary pulmonary hypertension: Secondary | ICD-10-CM | POA: Insufficient documentation

## 2021-05-12 DIAGNOSIS — H4720 Unspecified optic atrophy: Secondary | ICD-10-CM

## 2021-05-12 NOTE — Assessment & Plan Note (Addendum)
Estimated on echocardiogram, 45 mmHg.  Likely secondary.  Overall doing well.  No lower extremity edema.  No shortness of breath.  Feeling well.

## 2021-05-12 NOTE — Progress Notes (Signed)
Cardiology Office Note:    Date:  05/12/2021   ID:  DAVIN Moore, DOB 01/18/1936, MRN [id number]  PCP:  Meredith Loader, FNP   [facility] Providers Cardiologist:  Meredith Furbish, MD     Referring MD: Meredith Loader, FNP    History of Present Illness:    Meredith Moore is a 85 y.o. female here for the follow-up of paroxysmal atrial tachycardia.  She was monitored and this showed no evidence of atrial fibrillation.  Doing well. Goes by [employer]   Prior office note: recent atrial tachycardia during the hospital 07/03/2018 with troponin was 0.31 this increased to 0.68.  Likely demand ischemia in the setting of A. fib RVR.  Beta-blocker was added.  Echocardiogram showed normal EF.   Throughout her hospitalization, C. difficile colitis was diagnosed and she was taking vancomycin.  She is feeling much better.   She has not had any chest pain shortness of breath syncope.  She denies any radiation history.  No prior thrombosis.  She is not very active because of her eyesight and knee and hip replacement but she does not have any difficulty with movement.  No shortness of breath in other words.   Her pulmonary pressures were moderately elevated on echocardiogram.  Other than mild pulmonary vascular congestion, nothing on chest x-ray.  She and her husband Meredith Moore act as a team.  He unfortunately has dementia, she has poor eyesight and they help each other out.    Past Medical History:  Diagnosis Date   Arthritis    Blind 10/12/2016   Has learned Braille   C. difficile colitis    Complication of anesthesia    "Blood pressure has gone too low before and effected vision" just with general   Complication of anesthesia    "woke up in severe panic attack" just with general   Glaucoma    Herpes zoster with unspecified complication    Hip dysplasia    right side   History of atrial fibrillation    RVR   History of sinus tachycardia    History of transfusion 2014   2 units, no  reaction-after knee replacement   Hyperlipidemia    under control   Iron deficiency anemia    Macular degeneration    Migraine    hx of    Osteoarthrosis, unspecified whether generalized or localized, unspecified site    Right hip pain    hx     Past Surgical History:  Procedure Laterality Date   APPENDECTOMY  1979   BELPHAROPTOSIS REPAIR     eye lid drooping repair   COLONOSCOPY     ovarian tumor   1979   benign tumor removed    REVERSE SHOULDER ARTHROPLASTY Left 12/21/2017   Procedure: LEFT REVERSE SHOULDER ARTHROPLASTY;  Surgeon: Meredith Britain, MD;  Location: [place];  Service: Orthopedics;  Laterality: Left;   REVERSE SHOULDER ARTHROPLASTY Right 06/04/2020   Procedure: REVERSE SHOULDER ARTHROPLASTY;  Surgeon: Meredith Britain, MD;  [facility];  Service: Orthopedics;  Laterality: Right;  158min   ROBOTIC ASSISTED TOTAL HYSTERECTOMY WITH BILATERAL SALPINGO OOPHERECTOMY Bilateral 03/25/2014   Procedure: ROBOTIC ASSISTED TOTAL HYSTERECTOMY WITH BILATERAL SALPINGO OOPHORECTOMY;  Surgeon: Meredith Amber, MD;  [facility];  Service: Gynecology;  Laterality: Bilateral;   TONSILLECTOMY     1945    TOTAL HIP ARTHROPLASTY Right 06/04/2014   Procedure: RIGHT TOTAL HIP ARTHROPLASTY ANTERIOR APPROACH;  Surgeon: Meredith Alf, MD;  [facility];  Service:  Orthopedics;  Laterality: Right;   TOTAL HIP ARTHROPLASTY Left 05/10/2017   Procedure: LEFT TOTAL HIP ARTHROPLASTY ANTERIOR APPROACH;  Surgeon: Meredith Arabian, MD;  [facility];  Service: Orthopedics;  Laterality: Left;  90 mins   TOTAL KNEE ARTHROPLASTY Right 09/10/2012   Procedure: RIGHT TOTAL KNEE ARTHROPLASTY;  Surgeon: Meredith Alf, MD;  [facility];  Service: Orthopedics;  Laterality: Right;   TOTAL KNEE ARTHROPLASTY Left 02/25/2019   Procedure: TOTAL KNEE ARTHROPLASTY;  Surgeon: Meredith Arabian, MD;  [facility];  Service: Orthopedics;  Laterality: Left;  37min    Current Medications: Current Meds  Medication  Sig   acetaminophen (TYLENOL) 650 MG CR tablet Take 1,300 mg by mouth every 8 (eight) hours as needed for pain.   atorvastatin (LIPITOR) 10 MG tablet Take 5 mg by mouth at bedtime.    Calcium Carb-Cholecalciferol (CALCIUM+D3 PO) Take 1 tablet by mouth daily.   Cholecalciferol (VITAMIN D3) 50 MCG (2000 UT) TABS Take 2,000 Units by mouth daily.    ferrous sulfate 324 MG TBEC Take 324 mg by mouth daily with breakfast.   latanoprost (XALATAN) 0.005 % ophthalmic solution Place 1 drop into both eyes at bedtime.    LUTEIN PO Take 1 tablet by mouth daily.   meclizine (ANTIVERT) 12.5 MG tablet Take one tablet by mouth up to four times daily as needed for vertigo   methocarbamol (ROBAXIN) 500 MG tablet Take 1 tablet (500 mg total) by mouth 3 (three) times daily as needed for muscle spasms.   metoprolol tartrate (LOPRESSOR) 25 MG tablet Take 1 tablet (25 mg total) by mouth 2 (two) times daily. Please keep upcoming appt in Oct. To receive future refills. Thank you (Patient taking differently: Take 25 mg by mouth daily.)   Multiple Vitamins-Minerals (PRESERVISION AREDS 2+MULTI VIT) CAPS Take 1 tablet by mouth 2 (two) times daily.   ondansetron (ZOFRAN) 4 MG tablet Take 1 tablet (4 mg total) by mouth every 8 (eight) hours as needed for nausea or vomiting.   QUEtiapine (SEROQUEL) 25 MG tablet Take 12.5 mg by mouth at bedtime.   SIMBRINZA 1-0.2 % SUSP Place 1 drop into both eyes 3 (three) times daily with meals.    timolol (TIMOPTIC) 0.5 % ophthalmic solution Place 1 drop into both eyes 2 (two) times daily.      Allergies:   Temazepam, Tramadol, Formaldehyde, and Nickel   Social History   Socioeconomic History   Marital status: Married    Spouse name: Not on file   Number of children: Not on file   Years of education: Not on file   Highest education level: Not on file  Occupational History   Not on file  Tobacco Use   Smoking status: Former    Packs/day: 0.25    Years: 15.00    Pack years: 3.75     Types: Cigarettes    Quit date: 08/01/1988    Years since quitting: 32.8   Smokeless tobacco: Never  Vaping Use   Vaping Use: Never used  Substance and Sexual Activity   Alcohol use: No   Drug use: No   Sexual activity: Yes    Birth control/protection: Surgical    Comment: Hysterectomy  Other Topics Concern   Not on file  Social History Narrative   Married   Former smoker 1990   Alcohol none   Exercise walk daily about one mile, gym   Social Determinants of Health   Financial Resource Strain: Not on [employer]  Insecurity: Not on file  Transportation Needs: Not on file  Physical Activity: Not on file  Stress: Not on file  Social Connections: Not on file     Family History: The patient's family history includes Heart disease in her father and mother.  ROS:   Please see the history of present illness.     All other systems reviewed and are negative.  EKGs/Labs/Other Studies Reviewed:    The following studies were reviewed today:  Event monitor 01/07/2019:  Sinus rhythm predominant. No atrial fibrillation identified.   There are brief runs of paroxysmal atrial tachycardia which correlate with her symptoms.   Occasional PVCs, ventricular bigeminy, trigeminy.   One episode of second-degree heart block type I, Wenkebach   ECHO 07/04/18: - Left ventricle: The cavity size was normal. Systolic function was   normal. The estimated ejection fraction was in the range of 55%   to 60%. Wall motion was normal; there were no regional wall   motion abnormalities. Features are consistent with a pseudonormal   left ventricular filling pattern, with concomitant abnormal   relaxation and increased filling pressure (grade 2 diastolic   dysfunction). - Aortic valve: There was trivial regurgitation. - Left atrium: The atrium was mildly dilated. - Right atrium: The atrium was moderately dilated. - Atrial septum: No defect or patent foramen ovale was identified. - Tricuspid valve:  There was mild-moderate regurgitation. - Pulmonary arteries: Systolic pressure was moderately increased.   PA peak pressure: 45 mm Hg (S).    EKG:  EKG is  ordered today.  The ekg ordered today demonstrates SR 67  Recent Labs: 05/28/2020: BUN 25; Creatinine, Ser 1.04; Hemoglobin 10.7; Platelets 279; Potassium 5.4; Sodium 140  Recent Lipid Panel    Component Value Date/Time   CHOL 192 10/11/2016 1108   CHOL 185 10/05/2015 0950   TRIG 78 10/11/2016 1108   HDL 69 10/11/2016 1108   HDL 67 10/05/2015 0950   CHOLHDL 2.8 10/11/2016 1108   VLDL 16 10/11/2016 1108   LDLCALC 107 (H) 10/11/2016 1108   LDLCALC 100 (H) 10/05/2015 0950     Risk Assessment/Calculations:          Physical Exam:    VS:  BP 112/80 (BP Location: Left Arm, Patient Position: Sitting, Cuff Size: Normal)   Pulse 67   Ht 5\' 4"  (1.626 m)   Wt 137 lb (62.1 kg)   SpO2 97%   BMI 23.52 kg/m     Wt Readings from Last 3 Encounters:  05/12/21 137 lb (62.1 kg)  06/04/20 132 lb (59.9 kg)  02/26/20 130 lb 6.4 oz (59.1 kg)     GEN:  Well nourished, well developed in no acute distress HEENT: poor eye sight NECK: No JVD; No carotid bruits LYMPHATICS: No lymphadenopathy CARDIAC: RRR, no murmurs, rubs, gallops RESPIRATORY:  Clear to auscultation without rales, wheezing or rhonchi  ABDOMEN: Soft, non-tender, non-distended MUSCULOSKELETAL:  No edema; No deformity  SKIN: Warm and dry NEUROLOGIC:  Alert and oriented x 3 PSYCHIATRIC:  Normal affect   ASSESSMENT:    1. Paroxysmal atrial tachycardia (HCC)   2. Other secondary pulmonary hypertension ([place])   3. Optic nerve atrophy    PLAN:    In order of problems listed above:  Paroxysmal atrial tachycardia ([place]) Doing well.  She is off of metoprolol 12.5 mg twice a day.  No further evidence.  Other secondary pulmonary hypertension ([place]) Estimated on echocardiogram, 45 mmHg.  Likely secondary.  Overall doing well.  No lower  extremity edema.  No shortness of  breath.  Feeling well.  Optic nerve atrophy Eyesight worsening has progressed.         Medication Adjustments/Labs and Tests Ordered: Current medicines are reviewed at length with the patient today.  Concerns regarding medicines are outlined above.  Orders Placed This Encounter  Procedures   EKG 12-Lead   No orders of the defined types were placed in this encounter.   Patient Instructions  Medication Instructions:  The current medical regimen is effective;  continue present plan and medications.  *If you need a refill on your cardiac medications before your next appointment, please call your pharmacy*  Follow-Up: At [facility], you and your health needs are our priority.  As part of our continuing mission to provide you with exceptional heart care, we have created designated Provider Care Teams.  These Care Teams include your primary Cardiologist (physician) and Advanced Practice Providers (APPs -  Physician Assistants and Nurse Practitioners) who all work together to provide you with the care you need, when you need it.  We recommend signing up for the patient portal called "MyChart".  Sign up information is provided on this After Visit Summary.  MyChart is used to connect with patients for Virtual Visits (Telemedicine).  Patients are able to view lab/test results, encounter notes, upcoming appointments, etc.  Non-urgent messages can be sent to your provider as well.   To learn more about what you can do with MyChart, go to [email or web address].    Your next appointment:   1 year(s)  The format for your next appointment:   In Person  Provider:   Candee Furbish, MD   Thank you for choosing [facility]!!     Signed, Meredith Furbish, MD  05/12/2021 5:09 PM    [place]

## 2021-05-12 NOTE — Assessment & Plan Note (Signed)
Eyesight worsening has progressed.

## 2021-05-12 NOTE — Patient Instructions (Signed)
Medication Instructions:  The current medical regimen is effective;  continue present plan and medications.  *If you need a refill on your cardiac medications before your next appointment, please call your pharmacy*  Follow-Up: At [facility], you and your health needs are our priority.  As part of our continuing mission to provide you with exceptional heart care, we have created designated Provider Care Teams.  These Care Teams include your primary Cardiologist (physician) and Advanced Practice Providers (APPs -  Physician Assistants and Nurse Practitioners) who all work together to provide you with the care you need, when you need it.  We recommend signing up for the patient portal called "MyChart".  Sign up information is provided on this After Visit Summary.  MyChart is used to connect with patients for Virtual Visits (Telemedicine).  Patients are able to view lab/test results, encounter notes, upcoming appointments, etc.  Non-urgent messages can be sent to your provider as well.   To learn more about what you can do with MyChart, go to [email or web address].    Your next appointment:   1 year(s)  The format for your next appointment:   In Person  Provider:   Mark Skains, MD   Thank you for choosing [facility]!!    

## 2021-05-12 NOTE — Assessment & Plan Note (Signed)
Doing well.  She is off of metoprolol 12.5 mg twice a day.  No further evidence.

## 2021-05-29 ENCOUNTER — Other Ambulatory Visit: Payer: Self-pay | Admitting: Cardiology

## 2021-08-11 ENCOUNTER — Encounter (INDEPENDENT_AMBULATORY_CARE_PROVIDER_SITE_OTHER): Payer: Medicare Other | Admitting: Ophthalmology

## 2021-08-11 ENCOUNTER — Encounter (INDEPENDENT_AMBULATORY_CARE_PROVIDER_SITE_OTHER): Payer: Self-pay | Admitting: Ophthalmology

## 2021-08-11 ENCOUNTER — Ambulatory Visit (INDEPENDENT_AMBULATORY_CARE_PROVIDER_SITE_OTHER): Payer: Medicare Other | Admitting: Ophthalmology

## 2021-08-11 ENCOUNTER — Other Ambulatory Visit: Payer: Self-pay

## 2021-08-11 DIAGNOSIS — H401133 Primary open-angle glaucoma, bilateral, severe stage: Secondary | ICD-10-CM

## 2021-08-11 DIAGNOSIS — H353134 Nonexudative age-related macular degeneration, bilateral, advanced atrophic with subfoveal involvement: Secondary | ICD-10-CM | POA: Diagnosis not present

## 2021-08-11 NOTE — Progress Notes (Signed)
08/11/2021     CHIEF COMPLAINT Patient presents for  Chief Complaint  Patient presents with   Retina Follow Up    1 Year AMD F/U OU  Pt sts VA OU is getting foggier. No other new symptoms reported OU.    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  HISTORY OF PRESENT ILLNESS: Meredith Moore is a 86 y.o. female who presents to the clinic today for:   HPI     Retina Follow Up           Diagnosis: Dry AMD   Laterality: both eyes   Onset: 1 year ago   Severity: moderate   Duration: 1 year   Course: gradually worsening   Comments: 1 Year AMD F/U OU  Pt sts VA OU is getting foggier. No other new symptoms reported OU.         Comments   1 yr fu OU OCT FP. Patient states vision is stable and unchanged since last visit. Denies any new floaters or FOL. Pt uses simbrina OU TID, Latanoprost QHS OU, Timolol BID OU.      Last edited by Laurin Coder on 08/11/2021  1:43 PM.      Referring physician: Lonia Skinner, MD [id number] [place],  [place] [address]  HISTORICAL INFORMATION:   Selected notes from the MEDICAL RECORD NUMBER       CURRENT MEDICATIONS: Current Outpatient Medications (Ophthalmic Drugs)  Medication Sig   latanoprost (XALATAN) 0.005 % ophthalmic solution Place 1 drop into both eyes at bedtime.    SIMBRINZA 1-0.2 % SUSP Place 1 drop into both eyes 3 (three) times daily with meals.    timolol (TIMOPTIC) 0.5 % ophthalmic solution Place 1 drop into both eyes 2 (two) times daily.    No current facility-administered medications for this visit. (Ophthalmic Drugs)   Current Outpatient Medications (Other)  Medication Sig   acetaminophen (TYLENOL) 650 MG CR tablet Take 1,300 mg by mouth every 8 (eight) hours as needed for pain.   atorvastatin (LIPITOR) 10 MG tablet Take 5 mg by mouth at bedtime.    Calcium Carb-Cholecalciferol (CALCIUM+D3 PO) Take 1 tablet by mouth daily.   Cholecalciferol (VITAMIN D3) 50 MCG (2000 UT) TABS Take 2,000  Units by mouth daily.    ferrous sulfate 324 MG TBEC Take 324 mg by mouth daily with breakfast.   LUTEIN PO Take 1 tablet by mouth daily.   meclizine (ANTIVERT) 12.5 MG tablet Take one tablet by mouth up to four times daily as needed for vertigo   methocarbamol (ROBAXIN) 500 MG tablet Take 1 tablet (500 mg total) by mouth 3 (three) times daily as needed for muscle spasms.   metoprolol tartrate (LOPRESSOR) 25 MG tablet Take 1 tablet (25 mg total) by mouth 2 (two) times daily.   Multiple Vitamins-Minerals (PRESERVISION AREDS 2+MULTI VIT) CAPS Take 1 tablet by mouth 2 (two) times daily.   ondansetron (ZOFRAN) 4 MG tablet Take 1 tablet (4 mg total) by mouth every 8 (eight) hours as needed for nausea or vomiting.   QUEtiapine (SEROQUEL) 25 MG tablet Take 12.5 mg by mouth at bedtime.   No current facility-administered medications for this visit. (Other)      REVIEW OF SYSTEMS:    ALLERGIES Allergies  Allergen Reactions   Temazepam    Tramadol     Fine when taking it- did not sleep for 6 weeks after stopping medication   Formaldehyde Rash  Nickel Rash    PAST MEDICAL HISTORY Past Medical History:  Diagnosis Date   Arthritis    Blind 10/12/2016   Has learned Braille   C. difficile colitis    Complication of anesthesia    "Blood pressure has gone too low before and effected vision" just with general   Complication of anesthesia    "woke up in severe panic attack" just with general   Glaucoma    Herpes zoster with unspecified complication    Hip dysplasia    right side   History of atrial fibrillation    RVR   History of sinus tachycardia    History of transfusion 2014   2 units, no reaction-after knee replacement   Hyperlipidemia    under control   Iron deficiency anemia    Macular degeneration    Migraine    hx of    Osteoarthrosis, unspecified whether generalized or localized, unspecified site    Right hip pain    hx    Past Surgical History:  Procedure  Laterality Date   APPENDECTOMY  1979   BELPHAROPTOSIS REPAIR     eye lid drooping repair   COLONOSCOPY     ovarian tumor   1979   benign tumor removed    REVERSE SHOULDER ARTHROPLASTY Left 12/21/2017   Procedure: LEFT REVERSE SHOULDER ARTHROPLASTY;  Surgeon: Justice Britain, MD;  Location: [facility];  Service: Orthopedics;  Laterality: Left;   REVERSE SHOULDER ARTHROPLASTY Right 06/04/2020   Procedure: REVERSE SHOULDER ARTHROPLASTY;  Surgeon: Justice Britain, MD;  [facility];  Service: Orthopedics;  Laterality: Right;  187min   ROBOTIC ASSISTED TOTAL HYSTERECTOMY WITH BILATERAL SALPINGO OOPHERECTOMY Bilateral 03/25/2014   Procedure: ROBOTIC ASSISTED TOTAL HYSTERECTOMY WITH BILATERAL SALPINGO OOPHORECTOMY;  Surgeon: Everitt Amber, MD;  [facility];  Service: Gynecology;  Laterality: Bilateral;   TONSILLECTOMY     1945    TOTAL HIP ARTHROPLASTY Right 06/04/2014   Procedure: RIGHT TOTAL HIP ARTHROPLASTY ANTERIOR APPROACH;  Surgeon: Gearlean Alf, MD;  [facility];  Service: Orthopedics;  Laterality: Right;   TOTAL HIP ARTHROPLASTY Left 05/10/2017   Procedure: LEFT TOTAL HIP ARTHROPLASTY ANTERIOR APPROACH;  Surgeon: Gaynelle Arabian, MD;  [facility];  Service: Orthopedics;  Laterality: Left;  90 mins   TOTAL KNEE ARTHROPLASTY Right 09/10/2012   Procedure: RIGHT TOTAL KNEE ARTHROPLASTY;  Surgeon: Gearlean Alf, MD;  [facility];  Service: Orthopedics;  Laterality: Right;   TOTAL KNEE ARTHROPLASTY Left 02/25/2019   Procedure: TOTAL KNEE ARTHROPLASTY;  Surgeon: Gaynelle Arabian, MD;  [facility];  Service: Orthopedics;  Laterality: Left;  43min    FAMILY HISTORY Family History  Problem Relation Age of Onset   Heart disease Mother    Heart disease Father     SOCIAL HISTORY Social History   Tobacco Use   Smoking status: Former    Packs/day: 0.25    Years: 15.00    Pack years: 3.75    Types: Cigarettes    Quit date: 08/01/1988    Years since quitting: 33.0   Smokeless  tobacco: Never  Vaping Use   Vaping Use: Never used  Substance Use Topics   Alcohol use: No   Drug use: No         OPHTHALMIC EXAM:  Base Eye Exam     Visual Acuity (ETDRS)       Right Left   Dist [place] HM CF at face         Tonometry (Tonopen,  1:43 PM)       Right Left   Pressure 14 17         Pupils       Pupils Dark Light React APD   Right PERRL 5 5 Minimal None   Left PERRL 5 5 Minimal None         Visual Fields (Counting fingers)       Left Right   Restrictions Total inferior nasal deficiency Total superior nasal deficiency  HM OU, not CF        Extraocular Movement       Right Left    Full Full         Neuro/Psych     Oriented x3: Yes   Mood/Affect: Normal         Dilation     Both eyes: 1.0% Mydriacyl, 2.5% Phenylephrine @ 1:40 PM           Slit Lamp and Fundus Exam     External Exam       Right Left   External Normal Normal         Slit Lamp Exam       Right Left   Lids/Lashes Normal Normal   Conjunctiva/Sclera White and quiet White and quiet   Cornea Clear Clear   Anterior Chamber Deep and quiet Deep and quiet   Iris Round and reactive Round and reactive   Lens Centered posterior chamber intraocular lens Centered posterior chamber intraocular lens   Anterior Vitreous Normal Normal         Fundus Exam       Right Left   Posterior Vitreous Posterior vitreous detachment Posterior vitreous detachment   Disc Peripapillary atrophy Peripapillary atrophy   C/D Ratio 0.95 0.98   Macula Geographic atrophy 10 da size Geographic atrophy 8 da size   Vessels Normal Normal   Periphery Normal Normal            IMAGING AND PROCEDURES  Imaging and Procedures for 08/11/21  OCT, Retina - OU - Both Eyes       Right Eye Quality was good. Scan locations included subfoveal. Progression has been stable. Findings include abnormal foveal contour, outer retinal atrophy, inner retinal atrophy.   Left Eye Quality was  good. Scan locations included subfoveal. Progression has been stable. Findings include abnormal foveal contour, outer retinal atrophy, inner retinal atrophy.              ASSESSMENT/PLAN:  Primary open angle glaucoma of both eyes, severe stage Follow-up [facility] Dr. Dennison Mascot as scheduled  Advanced nonexudative age-related macular degeneration of both eyes with subfoveal involvement Advanced macular geographic atrophy, with no signs of active CNVM at the edges of the lesion     ICD-10-CM   1. Advanced nonexudative age-related macular degeneration of both eyes with subfoveal involvement  H35.3134 OCT, Retina - OU - Both Eyes    2. Primary open angle glaucoma of both eyes, severe stage  H40.1133       1.  OU, dry atrophic AMD, we are waiting clinical trials to be cleaned closed so that we can potentially offer injectable medicines for geographic atrophy progression in the future.  2.  Currently no sign of wet AMD OU  3.  Ophthalmic Meds Ordered this visit:  No orders of the defined types were placed in this encounter.      Return in about 14 months (around 10/10/2022) for DILATE OU, COLOR FP, OCT.  There  are no Patient Instructions on file for this visit.   Explained the diagnoses, plan, and follow up with the patient and they expressed understanding.  Patient expressed understanding of the importance of proper follow up care.   Clent Demark Hurman Ketelsen M.D. Diseases & Surgery of the Retina and Vitreous Retina [facility] Diabetic [place] 08/11/21     Abbreviations: M myopia (nearsighted); A astigmatism; H hyperopia (farsighted); P presbyopia; Mrx spectacle prescription;  CTL contact lenses; OD right eye; OS left eye; OU both eyes  XT exotropia; ET esotropia; PEK punctate epithelial keratitis; PEE punctate epithelial erosions; DES dry eye syndrome; MGD meibomian gland dysfunction; ATs artificial tears; PFAT's preservative free artificial tears; Dahmir Epperly nuclear sclerotic  cataract; PSC posterior subcapsular cataract; ERM epi-retinal membrane; PVD posterior vitreous detachment; RD retinal detachment; DM diabetes mellitus; DR diabetic retinopathy; NPDR non-proliferative diabetic retinopathy; PDR proliferative diabetic retinopathy; CSME clinically significant macular edema; DME diabetic macular edema; dbh dot blot hemorrhages; CWS cotton wool spot; POAG primary open angle glaucoma; C/D cup-to-disc ratio; HVF humphrey visual field; GVF goldmann visual field; OCT optical coherence tomography; IOP intraocular pressure; BRVO Branch retinal vein occlusion; CRVO central retinal vein occlusion; CRAO central retinal artery occlusion; BRAO branch retinal artery occlusion; RT retinal tear; SB scleral buckle; PPV pars plana vitrectomy; VH Vitreous hemorrhage; PRP panretinal laser photocoagulation; IVK intravitreal kenalog; VMT vitreomacular traction; MH Macular hole;  NVD neovascularization of the disc; NVE neovascularization elsewhere; AREDS age related eye disease study; ARMD age related macular degeneration; POAG primary open angle glaucoma; EBMD epithelial/anterior basement membrane dystrophy; ACIOL anterior chamber intraocular lens; IOL intraocular lens; PCIOL posterior chamber intraocular lens; Phaco/IOL phacoemulsification with intraocular lens placement; [place] photorefractive keratectomy; LASIK laser assisted in situ keratomileusis; HTN hypertension; DM diabetes mellitus; COPD chronic obstructive pulmonary disease

## 2021-08-11 NOTE — Assessment & Plan Note (Signed)
Advanced macular geographic atrophy, with no signs of active CNVM at the edges of the lesion

## 2021-08-11 NOTE — Assessment & Plan Note (Signed)
Follow-up [facility] Dr. Dennison Mascot as scheduled

## 2021-08-12 ENCOUNTER — Encounter (INDEPENDENT_AMBULATORY_CARE_PROVIDER_SITE_OTHER): Payer: Medicare Other | Admitting: Ophthalmology

## 2021-08-30 DIAGNOSIS — H5319 Other subjective visual disturbances: Secondary | ICD-10-CM | POA: Diagnosis not present

## 2021-08-30 DIAGNOSIS — H401133 Primary open-angle glaucoma, bilateral, severe stage: Secondary | ICD-10-CM | POA: Diagnosis not present

## 2021-08-30 DIAGNOSIS — H353134 Nonexudative age-related macular degeneration, bilateral, advanced atrophic with subfoveal involvement: Secondary | ICD-10-CM | POA: Diagnosis not present

## 2021-12-07 DIAGNOSIS — H16101 Unspecified superficial keratitis, right eye: Secondary | ICD-10-CM | POA: Diagnosis not present

## 2022-02-28 DIAGNOSIS — H401133 Primary open-angle glaucoma, bilateral, severe stage: Secondary | ICD-10-CM | POA: Diagnosis not present

## 2022-02-28 DIAGNOSIS — H5319 Other subjective visual disturbances: Secondary | ICD-10-CM | POA: Diagnosis not present

## 2022-02-28 DIAGNOSIS — H353134 Nonexudative age-related macular degeneration, bilateral, advanced atrophic with subfoveal involvement: Secondary | ICD-10-CM | POA: Diagnosis not present

## 2022-03-09 DIAGNOSIS — E782 Mixed hyperlipidemia: Secondary | ICD-10-CM | POA: Diagnosis not present

## 2022-03-09 DIAGNOSIS — R7303 Prediabetes: Secondary | ICD-10-CM | POA: Diagnosis not present

## 2022-03-09 DIAGNOSIS — Z0000 Encounter for general adult medical examination without abnormal findings: Secondary | ICD-10-CM | POA: Diagnosis not present

## 2022-03-15 DIAGNOSIS — M810 Age-related osteoporosis without current pathological fracture: Secondary | ICD-10-CM | POA: Diagnosis not present

## 2022-03-15 DIAGNOSIS — Z0000 Encounter for general adult medical examination without abnormal findings: Secondary | ICD-10-CM | POA: Diagnosis not present

## 2022-03-15 DIAGNOSIS — E782 Mixed hyperlipidemia: Secondary | ICD-10-CM | POA: Diagnosis not present

## 2022-03-15 DIAGNOSIS — H409 Unspecified glaucoma: Secondary | ICD-10-CM | POA: Diagnosis not present

## 2022-03-15 DIAGNOSIS — N1831 Chronic kidney disease, stage 3a: Secondary | ICD-10-CM | POA: Diagnosis not present

## 2022-03-15 DIAGNOSIS — D649 Anemia, unspecified: Secondary | ICD-10-CM | POA: Diagnosis not present

## 2022-03-15 DIAGNOSIS — R7303 Prediabetes: Secondary | ICD-10-CM | POA: Diagnosis not present

## 2022-05-10 DIAGNOSIS — Z1231 Encounter for screening mammogram for malignant neoplasm of breast: Secondary | ICD-10-CM | POA: Diagnosis not present

## 2022-05-24 DIAGNOSIS — Z780 Asymptomatic menopausal state: Secondary | ICD-10-CM | POA: Diagnosis not present

## 2022-05-24 DIAGNOSIS — M810 Age-related osteoporosis without current pathological fracture: Secondary | ICD-10-CM | POA: Diagnosis not present

## 2022-05-24 DIAGNOSIS — M8588 Other specified disorders of bone density and structure, other site: Secondary | ICD-10-CM | POA: Diagnosis not present

## 2022-05-26 ENCOUNTER — Telehealth: Payer: Self-pay | Admitting: Cardiology

## 2022-05-26 NOTE — Telephone Encounter (Signed)
Pt c/o medication issue:  1. Name of Medication: metoprolol tartrate (LOPRESSOR) 25 MG tablet  2. How are you currently taking this medication (dosage and times per day)? 1/2 tab 1/2 tab night   3. Are you having a reaction (difficulty breathing--STAT)?   4. What is your medication issue?  Pt is requesting call back to get clarification on how this medication is suppose to be taken. She says her instructions are different from how she's been taking it. Please advise.

## 2022-05-26 NOTE — Telephone Encounter (Signed)
Reviewed with Dr Marlou Porch who states he is OK for pt to be off of it all metoprolol.  Pt has been taking 25 mg - 1/2 tablet twice daily for sometime.  She is comfortable and feels well taking this.  pt questioned the dose since when she received her last refill the instructions state 25 mg one tablet twice daily.  Pt will continue as taking 1/2 tab twice a day.  She will call back if any further questions/concerns.

## 2022-06-07 ENCOUNTER — Encounter: Payer: Self-pay | Admitting: Physician Assistant

## 2022-06-07 NOTE — Progress Notes (Unsigned)
Cardiology Office Note    Date:  06/08/2022   ID:  Meredith Moore, DOB Oct 18, 1935, MRN [id number]  PCP:  Kristen Loader, FNP  Cardiologist:  Candee Furbish, MD  Electrophysiologist:  None   Chief Complaint: palpitations  History of Present Illness:   Meredith Gula "[name]" is a 86 y.o. female with history of paroxysmal atrial tachycardia, PACs/PVCs, Mobitz 1 AVB, mod pulm HTN, blindness, glaucoma, macular degeneration, HLD, IDA, migraine who is seen for follow-up.  She was remotely hospitalized in 2019 with viral gastroenteritis at which time cardiology saw for PAT (reported as NOT afib in notes), and elevated troponin (0.61) felt due to demand ischemia from tachycardia and illness. 2D echo 07/2018 EF 55-60%, g2DD, mild LAE, mod RAE, moderate pulm HTN. Monitor 12/2018 showed NSR with brief runs of PAT, 5.1% PACs, 1.1% PVCs, one episode of 2nd degree type 1 AVB.   She is seen for follow-up today with her daughter. Overall she feels she is doing well but had 2 episodes of palpitations about a month ago after the passing of her husband of over 30 years. He suffered from dementia and she has been dealing with a lot of grief. On two separate occasions she had episode of rapid HR lasting <30 seconds resolved without intervention. It has not recurred since that time. She otherwise has not had any concerning cardiac symptoms. She confirms she's been on metoprolol 12.'5mg'$  BID for many years, not '25mg'$  BID as intermittently listed. She shares with me that her husband used to like to feed the squirrels.   Labwork independently reviewed: 05/2020 Hgb 10.7, plt wnl, K 5.4, Cr 1.04 2020 LFTs ok   Cardiology Studies:   Studies reviewed are outlined and summarized above. Reports included below if pertinent.   Monitor 12/2018  Sinus rhythm predominant. No atrial fibrillation identified. There are brief runs of paroxysmal atrial tachycardia which correlate with her symptoms. Occasional PVCs,  ventricular bigeminy, trigeminy. One episode of second-degree heart block type I, Wenkebach   Reassuring that we did not see any evidence of atrial fibrillation.  Her symptomatology is consistent with her previously described paroxysmal atrial tachycardia.  We will continue with low-dose metoprolol. Candee Furbish, MD  2D echo 07/2018  - Left ventricle: The cavity size was normal. Systolic function was    normal. The estimated ejection fraction was in the range of 55%    to 60%. Wall motion was normal; there were no regional wall    motion abnormalities. Features are consistent with a pseudonormal    left ventricular filling pattern, with concomitant abnormal    relaxation and increased filling pressure (grade 2 diastolic    dysfunction).  - Aortic valve: There was trivial regurgitation.  - Left atrium: The atrium was mildly dilated.  - Right atrium: The atrium was moderately dilated.  - Atrial septum: No defect or patent foramen ovale was identified.  - Tricuspid valve: There was mild-moderate regurgitation.  - Pulmonary arteries: Systolic pressure was moderately increased.    PA peak pressure: 45 mm Hg (S).   Impressions:   - Normal LV EF, grade 2 diastolic dysfunction, elevated PA systolic    pressure. Mild-moderate TR.     Past Medical History:  Diagnosis Date   Arthritis    AV block, Mobitz 1    Blind 10/12/2016   Has learned Braille   C. difficile colitis    Complication of anesthesia    "Blood pressure has gone too low before and effected vision"  just with general   Complication of anesthesia    "woke up in severe panic attack" just with general   Glaucoma    Herpes zoster with unspecified complication    Hip dysplasia    right side   History of sinus tachycardia    History of transfusion 2014   2 units, no reaction-after knee replacement   Hyperlipidemia    under control   Iron deficiency anemia    Macular degeneration    Migraine    hx of    Osteoarthrosis,  unspecified whether generalized or localized, unspecified site    PAT (paroxysmal atrial tachycardia)    Premature atrial contractions    PVC's (premature ventricular contractions)    Right hip pain    hx     Past Surgical History:  Procedure Laterality Date   APPENDECTOMY  1979   BELPHAROPTOSIS REPAIR     eye lid drooping repair   COLONOSCOPY     ovarian tumor   1979   benign tumor removed    REVERSE SHOULDER ARTHROPLASTY Left 12/21/2017   Procedure: LEFT REVERSE SHOULDER ARTHROPLASTY;  Surgeon: Justice Britain, MD;  Location: [facility];  Service: Orthopedics;  Laterality: Left;   REVERSE SHOULDER ARTHROPLASTY Right 06/04/2020   Procedure: REVERSE SHOULDER ARTHROPLASTY;  Surgeon: Justice Britain, MD;  Location: [facility];  Service: Orthopedics;  Laterality: Right;  163mn   ROBOTIC ASSISTED TOTAL HYSTERECTOMY WITH BILATERAL SALPINGO OOPHERECTOMY Bilateral 03/25/2014   Procedure: ROBOTIC ASSISTED TOTAL HYSTERECTOMY WITH BILATERAL SALPINGO OOPHORECTOMY;  Surgeon: EEveritt Amber MD;  Location: [facility];  Service: Gynecology;  Laterality: Bilateral;   TONSILLECTOMY     1945    TOTAL HIP ARTHROPLASTY Right 06/04/2014   Procedure: RIGHT TOTAL HIP ARTHROPLASTY ANTERIOR APPROACH;  Surgeon: FGearlean Alf MD;  Location: [facility];  Service: Orthopedics;  Laterality: Right;   TOTAL HIP ARTHROPLASTY Left 05/10/2017   Procedure: LEFT TOTAL HIP ARTHROPLASTY ANTERIOR APPROACH;  Surgeon: AGaynelle Arabian MD;  Location: [facility];  Service: Orthopedics;  Laterality: Left;  90 mins   TOTAL KNEE ARTHROPLASTY Right 09/10/2012   Procedure: RIGHT TOTAL KNEE ARTHROPLASTY;  Surgeon: FGearlean Alf MD;  Location: [facility];  Service: Orthopedics;  Laterality: Right;   TOTAL KNEE ARTHROPLASTY Left 02/25/2019   Procedure: TOTAL KNEE ARTHROPLASTY;  Surgeon: AGaynelle Arabian MD;  Location: [facility];  Service: Orthopedics;  Laterality: Left;  55m    Current Medications: Current Meds  Medication Sig   acetaminophen (TYLENOL) 650 MG CR  tablet Take 1,300 mg by mouth every 8 (eight) hours as needed for pain.   atorvastatin (LIPITOR) 10 MG tablet Take 5 mg by mouth at bedtime.    Calcium Carb-Cholecalciferol (CALCIUM+D3 PO) Take 1 tablet by mouth daily.   Cholecalciferol (VITAMIN D3) 50 MCG (2000 UT) TABS Take 2,000 Units by mouth daily.    ferrous sulfate 324 MG TBEC Take 324 mg by mouth daily with breakfast.   latanoprost (XALATAN) 0.005 % ophthalmic solution Place 1 drop into both eyes at bedtime.    LUTEIN PO Take 1 tablet by mouth daily.   meclizine (ANTIVERT) 12.5 MG tablet Take one tablet by mouth up to four times daily as needed for vertigo   metoprolol tartrate (LOPRESSOR) 25 MG tablet Take 12.5 mg by mouth 2 (two) times daily.   Multiple Vitamins-Minerals (PRESERVISION AREDS 2+MULTI VIT) CAPS Take 1 tablet by mouth 2 (two) times daily.   QUEtiapine (SEROQUEL) 25 MG tablet Take 12.5 mg by mouth at bedtime.   [facility]  1-0.2 % SUSP Place 1 drop into both eyes 3 (three) times daily with meals.    timolol (TIMOPTIC) 0.5 % ophthalmic solution Place 1 drop into both eyes 2 (two) times daily.       Allergies:   Temazepam, Tramadol, Formaldehyde, and Nickel   Social History   Socioeconomic History   Marital status: Married    Spouse name: Not on file   Number of children: Not on file   Years of education: Not on file   Highest education level: Not on file  Occupational History   Not on file  Tobacco Use   Smoking status: Former    Packs/day: 0.25    Years: 15.00    Total pack years: 3.75    Types: Cigarettes    Quit date: 08/01/1988    Years since quitting: 33.8   Smokeless tobacco: Never  Vaping Use   Vaping Use: Never used  Substance and Sexual Activity   Alcohol use: No   Drug use: No   Sexual activity: Yes    Birth control/protection: Surgical    Comment: Hysterectomy  Other Topics Concern   Not on file  Social History Narrative   Married   Former smoker 1990   Alcohol none   Exercise walk daily  about one mile, gym   Social Determinants of Health   Financial Resource Strain: Not on file  Food Insecurity: Not on file  Transportation Needs: Not on file  Physical Activity: Not on file  Stress: Not on file  Social Connections: Not on file     Family History:  The patient's family history includes Heart disease in her father and mother.  ROS:   Please see the history of present illness. All other systems are reviewed and otherwise negative.    EKG(s)/Additional Labs   EKG:  EKG is ordered today, personally reviewed, demonstrating NSR 65bpm, no acute STT changes.  Recent Labs: No results found for requested labs within last 365 days.  Recent Lipid Panel    Component Value Date/Time   CHOL 192 10/11/2016 1108   CHOL 185 10/05/2015 0950   TRIG 78 10/11/2016 1108   HDL 69 10/11/2016 1108   HDL 67 10/05/2015 0950   CHOLHDL 2.8 10/11/2016 1108   VLDL 16 10/11/2016 1108   LDLCALC 107 (H) 10/11/2016 1108   LDLCALC 100 (H) 10/05/2015 0950    PHYSICAL EXAM:    VS:  BP 128/80   Pulse 65   Ht '5\' 4"'$  (1.626 m)   Wt 127 lb 12.8 oz (58 kg)   SpO2 96%   BMI 21.94 kg/m   BMI: Body mass index is 21.94 kg/m.  GEN: Well nourished, well developed female in no acute distress HEENT: normocephalic, atraumatic Neck: no JVD, carotid bruits, or masses Cardiac: RRR; no murmurs, rubs, or gallops, no edema  Respiratory:  clear to auscultation bilaterally, normal work of breathing GI: soft, nontender, nondistended, + BS MS: no deformity or atrophy Skin: warm and dry, no rash Neuro:  Alert and Oriented x 3, Strength and sensation are intact, follows commands Psych: euthymic mood, full affect  Wt Readings from Last 3 Encounters:  06/08/22 127 lb 12.8 oz (58 kg)  05/12/21 137 lb (62.1 kg)  06/04/20 132 lb (59.9 kg)     ASSESSMENT & PLAN:   1. Palpitations with hx of PAT - brief recurrence last month in context of significant emotional grief, the first time this has happened in  years. Given the circumstances and lack  of recurrence (with known hx of Mobitz 1 as well), I would be hesitant to adjust her regimen unless symptoms recurred. I did instruct her that if this recurs in the future she may take an extra 1/2 tablet of metoprolol and to seek care if symptoms persist. We'll update her basic lytes/TSH and echo as well to ensure no Takotsubo component.  2. PACs/PVCs - plan as above. Check lytes. Continue BB.  3. Mobitz type 1 AVB  4. Moderate pulm HTN - this was noted during echo in 2019 hospitalization but having fluid shifts at that time in setting of gastroenteritis. She otherwise does not describe any acute worsening of SOB. We'll see what repeat study shows.     Disposition: Per shared decision making, plan f/u with Dr. Marlou Porch in 1 year, sooner if symptoms recur.   Medication Adjustments/Labs and Tests Ordered: Current medicines are reviewed at length with the patient today.  Concerns regarding medicines are outlined above. Medication changes, Labs and Tests ordered today are summarized above and listed in the Patient Instructions accessible in Encounters.   Signed, Charlie Pitter, PA-C  06/08/2022 4:02 PM    [place] Phone: [phone]; Fax: [phone]

## 2022-06-08 ENCOUNTER — Encounter: Payer: Self-pay | Admitting: Physician Assistant

## 2022-06-08 ENCOUNTER — Ambulatory Visit: Payer: Medicare Other | Attending: Physician Assistant | Admitting: Physician Assistant

## 2022-06-08 VITALS — BP 128/80 | HR 65 | Ht 64.0 in | Wt 127.8 lb

## 2022-06-08 DIAGNOSIS — I272 Pulmonary hypertension, unspecified: Secondary | ICD-10-CM

## 2022-06-08 DIAGNOSIS — I4719 Other supraventricular tachycardia: Secondary | ICD-10-CM

## 2022-06-08 DIAGNOSIS — I441 Atrioventricular block, second degree: Secondary | ICD-10-CM | POA: Diagnosis not present

## 2022-06-08 DIAGNOSIS — I491 Atrial premature depolarization: Secondary | ICD-10-CM | POA: Diagnosis not present

## 2022-06-08 NOTE — Patient Instructions (Signed)
Medication Instructions:   Your physician recommends that you continue on your current medications as directed. Please refer to the Current Medication list given to you today.   *If you need a refill on your cardiac medications before your next appointment, please call your pharmacy*   Lab Work: BMET MAG CBC AND TSH TODAY    If you have labs (blood work) drawn today and your tests are completely normal, you will receive your results only by: [place] (if you have MyChart) OR A paper copy in the mail If you have any lab test that is abnormal or we need to change your treatment, we will call you to review the results.   Testing/Procedures: Your physician has requested that you have an echocardiogram. Echocardiography is a painless test that uses sound waves to create images of your heart. It provides your doctor with information about the size and shape of your heart and how well your heart's chambers and valves are working. This procedure takes approximately one hour. There are no restrictions for this procedure. Please do NOT wear cologne, perfume, aftershave, or lotions (deodorant is allowed). Please arrive 15 minutes prior to your appointment time.   Follow-Up: At [facility], you and your health needs are our priority.  As part of our continuing mission to provide you with exceptional heart care, we have created designated Provider Care Teams.  These Care Teams include your primary Cardiologist (physician) and Advanced Practice Providers (APPs -  Physician Assistants and Nurse Practitioners) who all work together to provide you with the care you need, when you need it.  We recommend signing up for the patient portal called "MyChart".  Sign up information is provided on this After Visit Summary.  MyChart is used to connect with patients for Virtual Visits (Telemedicine).  Patients are able to view lab/test results, encounter notes, upcoming appointments, etc.  Non-urgent  messages can be sent to your provider as well.   To learn more about what you can do with MyChart, go to [email or web address].    Your next appointment:   1 year(s)  The format for your next appointment:   In Person  Provider:   Candee Furbish, MD      Other Instructions   Important Information About Sugar

## 2022-06-09 LAB — CBC
Hematocrit: 35.1 % (ref 34.0–46.6)
Hemoglobin: 11.5 g/dL (ref 11.1–15.9)
MCH: 30.3 pg (ref 26.6–33.0)
MCHC: 32.8 g/dL (ref 31.5–35.7)
MCV: 92 fL (ref 79–97)
Platelets: 259 10*3/uL (ref 150–450)
RBC: 3.8 x10E6/uL (ref 3.77–5.28)
RDW: 12.2 % (ref 11.7–15.4)
WBC: 7.6 10*3/uL (ref 3.4–10.8)

## 2022-06-09 LAB — BASIC METABOLIC PANEL
BUN/Creatinine Ratio: 22 (ref 12–28)
BUN: 20 mg/dL (ref 8–27)
CO2: 20 mmol/L (ref 20–29)
Calcium: 10.1 mg/dL (ref 8.7–10.3)
Chloride: 105 mmol/L (ref 96–106)
Creatinine, Ser: 0.92 mg/dL (ref 0.57–1.00)
Glucose: 103 mg/dL — ABNORMAL HIGH (ref 70–99)
Potassium: 5.2 mmol/L (ref 3.5–5.2)
Sodium: 141 mmol/L (ref 134–144)
eGFR: 62 mL/min/{1.73_m2} (ref >59)

## 2022-06-09 LAB — MAGNESIUM: Magnesium: 2.3 mg/dL (ref 1.6–2.3)

## 2022-06-09 LAB — TSH: TSH: 2.58 u[IU]/mL (ref 0.450–4.500)

## 2022-06-15 DIAGNOSIS — I480 Paroxysmal atrial fibrillation: Secondary | ICD-10-CM | POA: Diagnosis not present

## 2022-06-15 DIAGNOSIS — G4700 Insomnia, unspecified: Secondary | ICD-10-CM | POA: Diagnosis not present

## 2022-06-15 DIAGNOSIS — R7303 Prediabetes: Secondary | ICD-10-CM | POA: Diagnosis not present

## 2022-06-15 DIAGNOSIS — R42 Dizziness and giddiness: Secondary | ICD-10-CM | POA: Diagnosis not present

## 2022-06-15 DIAGNOSIS — I5032 Chronic diastolic (congestive) heart failure: Secondary | ICD-10-CM | POA: Diagnosis not present

## 2022-06-15 DIAGNOSIS — N1831 Chronic kidney disease, stage 3a: Secondary | ICD-10-CM | POA: Diagnosis not present

## 2022-06-28 ENCOUNTER — Ambulatory Visit (HOSPITAL_COMMUNITY): Payer: Medicare Other | Attending: Physician Assistant

## 2022-06-28 DIAGNOSIS — I493 Ventricular premature depolarization: Secondary | ICD-10-CM | POA: Insufficient documentation

## 2022-06-28 DIAGNOSIS — I517 Cardiomegaly: Secondary | ICD-10-CM

## 2022-06-28 DIAGNOSIS — E785 Hyperlipidemia, unspecified: Secondary | ICD-10-CM | POA: Diagnosis not present

## 2022-06-28 DIAGNOSIS — I272 Pulmonary hypertension, unspecified: Secondary | ICD-10-CM | POA: Diagnosis not present

## 2022-06-28 DIAGNOSIS — I4719 Other supraventricular tachycardia: Secondary | ICD-10-CM | POA: Diagnosis not present

## 2022-06-28 DIAGNOSIS — Z87891 Personal history of nicotine dependence: Secondary | ICD-10-CM | POA: Insufficient documentation

## 2022-06-28 DIAGNOSIS — Z8249 Family history of ischemic heart disease and other diseases of the circulatory system: Secondary | ICD-10-CM | POA: Insufficient documentation

## 2022-06-28 DIAGNOSIS — I351 Nonrheumatic aortic (valve) insufficiency: Secondary | ICD-10-CM | POA: Insufficient documentation

## 2022-06-28 DIAGNOSIS — I5030 Unspecified diastolic (congestive) heart failure: Secondary | ICD-10-CM | POA: Diagnosis not present

## 2022-06-29 LAB — ECHOCARDIOGRAM COMPLETE
Area-P 1/2: 3.06 cm2
S' Lateral: 2.1 cm

## 2022-08-23 DIAGNOSIS — H6992 Unspecified Eustachian tube disorder, left ear: Secondary | ICD-10-CM | POA: Diagnosis not present

## 2022-08-23 DIAGNOSIS — H9312 Tinnitus, left ear: Secondary | ICD-10-CM | POA: Diagnosis not present

## 2022-08-23 DIAGNOSIS — H903 Sensorineural hearing loss, bilateral: Secondary | ICD-10-CM | POA: Diagnosis not present

## 2022-09-28 DIAGNOSIS — H353134 Nonexudative age-related macular degeneration, bilateral, advanced atrophic with subfoveal involvement: Secondary | ICD-10-CM | POA: Diagnosis not present

## 2022-09-28 DIAGNOSIS — H401133 Primary open-angle glaucoma, bilateral, severe stage: Secondary | ICD-10-CM | POA: Diagnosis not present

## 2022-10-10 ENCOUNTER — Encounter (INDEPENDENT_AMBULATORY_CARE_PROVIDER_SITE_OTHER): Payer: Medicare Other | Admitting: Ophthalmology

## 2022-10-14 DIAGNOSIS — H43812 Vitreous degeneration, left eye: Secondary | ICD-10-CM | POA: Diagnosis not present

## 2022-10-14 DIAGNOSIS — H401133 Primary open-angle glaucoma, bilateral, severe stage: Secondary | ICD-10-CM | POA: Diagnosis not present

## 2022-10-14 DIAGNOSIS — H353134 Nonexudative age-related macular degeneration, bilateral, advanced atrophic with subfoveal involvement: Secondary | ICD-10-CM | POA: Diagnosis not present

## 2022-11-19 ENCOUNTER — Other Ambulatory Visit: Payer: Self-pay | Admitting: Cardiology

## 2022-11-22 ENCOUNTER — Encounter: Payer: Self-pay | Admitting: Neurology

## 2022-11-22 ENCOUNTER — Ambulatory Visit: Payer: Medicare Other | Admitting: Neurology

## 2022-11-22 VITALS — BP 147/72 | HR 68 | Ht 64.0 in | Wt 123.0 lb

## 2022-11-22 DIAGNOSIS — F4321 Adjustment disorder with depressed mood: Secondary | ICD-10-CM | POA: Diagnosis not present

## 2022-11-22 DIAGNOSIS — F4320 Adjustment disorder, unspecified: Secondary | ICD-10-CM

## 2022-11-22 DIAGNOSIS — F5104 Psychophysiologic insomnia: Secondary | ICD-10-CM | POA: Diagnosis not present

## 2022-11-22 DIAGNOSIS — I480 Paroxysmal atrial fibrillation: Secondary | ICD-10-CM | POA: Diagnosis not present

## 2022-11-22 DIAGNOSIS — I272 Pulmonary hypertension, unspecified: Secondary | ICD-10-CM | POA: Diagnosis not present

## 2022-11-22 DIAGNOSIS — R269 Unspecified abnormalities of gait and mobility: Secondary | ICD-10-CM | POA: Diagnosis not present

## 2022-11-22 DIAGNOSIS — H402233 Chronic angle-closure glaucoma, bilateral, severe stage: Secondary | ICD-10-CM | POA: Diagnosis not present

## 2022-11-22 MED ORDER — TRAZODONE HCL 50 MG PO TABS: 25.0000 mg | ORAL_TABLET | Freq: Every evening | ORAL | 1 refills | Status: DC | PRN

## 2022-11-22 MED ORDER — GUAIFENESIN ER 600 MG PO TB12: 600.0000 mg | ORAL_TABLET | Freq: Two times a day (BID) | ORAL | 2 refills | Status: DC

## 2022-11-22 NOTE — Progress Notes (Signed)
SLEEP MEDICINE CLINIC    Provider:  Melvyn Novas, MD  Primary Care Physician:  Soundra Pilon, FNP [address] [place] [address]     Referring Provider: Soundra Pilon, Fnp [address] [place],  [place] [address]          Chief Complaint according to patient   Patient presents with:     New Patient (Initial Visit)     Patient in room #1 with her daughter. Patient states she has trouble with her balance. Patient states she has knot on her head and left ear pain.       HISTORY OF PRESENT ILLNESS:  Meredith Moore is a 87 y.o. female patient who is seen upon referral on 11/22/2022 from NP BRAKE at [facility],  for a evaluation of ear  pressure, and balance disturbance. The patient is nearly blind. She denies Vertigo. Chronic Tinnitus. Left ear- trouble started with her husbands death.  Her balance declined with the last weeks of her husbands life.  ENT found no abnormality. NP PCP has written for a non steroid nasal spray. There is no Sinus pressure, no Vertigo-  yet she has tried meclizine for treatment. Wakes up without ear pressure -  By 5.30 PM she has the most pressure in the left ear. Not weather dependent.    The patient wants to speak about chronic Insomnia, a condition we do not address here. She has been taking Seroquel 25 mg for 8 years. Her husband died in May 30, 2022. Since then no much sleep at all. Husband  had dementia, apraxia and was walking without a cane.  Chief concern according to patient : see above .    I have the pleasure of seeing Meredith Moore 11/22/22 a right -handed female with a possible sleep disorder.      Family medical /sleep history: no other family member on CPAP with OSA, insomnia, sleep walkers.    Social history:  Patient is widowed ,  lives in a household alone. Adult children near by.   Tobacco use; 1990.  ETOH use ; none ,  Caffeine intake in form of Coffee( before 3 Pm- 3-6 cups. Soda( /) Tea ( /) or energy  drinks Exercise in form of walking .  Hobbies :audio-books.     Sleep habits are as follows: The patient's dinner time is between 6.30 PM. The patient goes to bed at 10 PM and she is asleep at 11- and she wakes by 11.30, by 2.30, by 5 . 30 AM she feels as if her day could start.  continues to sleep for 1-2  hours between.   The preferred sleep position is left sided , with the support of 1 pillow.  Dreams are reportedly rare since [place] died. .   The patient wakes up spontaneously but has an audio-alarm. 6.30  AM is the usual rise time. He/ She reports not feeling refreshed or restored in AM, with symptoms such as dry mouth, and residual fatigue. No Naps .    Review of Systems: Out of a complete 14 system review, the patient complains of only the following symptoms, and all other reviewed systems are negative.:  Fatigue, sleepiness , snoring, fragmented sleep, Insomnia, RLS,  Grief - onset of balance problem with death of her husband- takes her own medication.       Social History   Socioeconomic History   Marital status: Married    Spouse name: Not on file  Number of children: Not on file   Years of education: Not on file   Highest education level: Not on file  Occupational History   Not on file  Tobacco Use   Smoking status: Former    Packs/day: 0.25    Years: 15.00    Additional pack years: 0.00    Total pack years: 3.75    Types: Cigarettes    Quit date: 08/01/1988    Years since quitting: 34.3   Smokeless tobacco: Never  Vaping Use   Vaping Use: Never used  Substance and Sexual Activity   Alcohol use: No   Drug use: No   Sexual activity: Yes    Birth control/protection: Surgical    Comment: Hysterectomy  Other Topics Concern   Not on file  Social History Narrative   Married   Former smoker 1990   Alcohol none   Exercise walk daily about one mile, gym   Social Determinants of Health   Financial Resource Strain: Not on file  Food Insecurity: Not on file   Transportation Needs: Not on file  Physical Activity: Not on file  Stress: Not on file  Social Connections: Not on file    Family History  Problem Relation Age of Onset   Heart disease Mother    Heart disease Father     Past Medical History:  Diagnosis Date   Arthritis    AV block, Mob[place] 1    Blind 10/12/2016   Has learned Braille   C. difficile colitis    Complication of anesthesia    "Blood pressure has gone too low before and effected vision" just with general   Complication of anesthesia    "woke up in severe panic attack" just with general   Glaucoma    Herpes zoster with unspecified complication    Hip dysplasia    right side   History of sinus tachycardia    History of transfusion 2014   2 units, no reaction-after knee replacement   Hyperlipidemia    under control   Iron deficiency anemia    Macular degeneration    Migraine    hx of    Osteoarthrosis, unspecified whether generalized or localized, unspecified site    PAT (paroxysmal atrial tachycardia)    Premature atrial contractions    PVC's (premature ventricular contractions)    Right hip pain    hx     Past Surgical History:  Procedure Laterality Date   APPENDECTOMY  1979   BELPHAROPTOSIS REPAIR     eye lid drooping repair   COLONOSCOPY     ovarian tumor   1979   benign tumor removed    REVERSE SHOULDER ARTHROPLASTY Left 12/21/2017   Procedure: LEFT REVERSE SHOULDER ARTHROPLASTY;  Surgeon: Francena Hanly, MD;  Location: [facility];  Service: Orthopedics;  Laterality: Left;   REVERSE SHOULDER ARTHROPLASTY Right 06/04/2020   Procedure: REVERSE SHOULDER ARTHROPLASTY;  Surgeon: Francena Hanly, MD;  Location: [facility];  Service: Orthopedics;  Laterality: Right;    ROBOTIC ASSISTED TOTAL HYSTERECTOMY WITH BILATERAL SALPINGO OOPHERECTOMY Bilateral 03/25/2014   Procedure: ROBOTIC ASSISTED TOTAL HYSTERECTOMY WITH BILATERAL SALPINGO OOPHORECTOMY;  Surgeon: Adolphus Birchwood, MD;  Location: [facility];  Service:  Gynecology;  Laterality: Bilateral;   TONSILLECTOMY     1945    TOTAL HIP ARTHROPLASTY Right 06/04/2014   Procedure: RIGHT TOTAL HIP ARTHROPLASTY ANTERIOR APPROACH;  Surgeon: Loanne Drilling, MD;  Location: [facility];  Service: Orthopedics;  Laterality: Right;   TOTAL HIP ARTHROPLASTY Left  05/10/2017   Procedure: LEFT TOTAL HIP ARTHROPLASTY ANTERIOR APPROACH;  Surgeon: Ollen Gross, MD;  Location: [facility];  Service: Orthopedics;  Laterality: Left;  90 mins   TOTAL KNEE ARTHROPLASTY Right 09/10/2012   Procedure: RIGHT TOTAL KNEE ARTHROPLASTY;  Surgeon: Loanne Drilling, MD;  Location: [facility];  Service: Orthopedics;  Laterality: Right;   TOTAL KNEE ARTHROPLASTY Left 02/25/2019   Procedure: TOTAL KNEE ARTHROPLASTY;  Surgeon: Ollen Gross, MD;  Location: [facility];  Service: Orthopedics;  Laterality: Left;      Current Outpatient Medications on File Prior to Visit  Medication Sig Dispense Refill   acetaminophen (TYLENOL) 650 MG CR tablet Take 1,300 mg by mouth every 8 (eight) hours as needed for pain.     alendronate (FOSAMAX) 70 MG tablet Take 70 mg by mouth once a week.     atorvastatin (LIPITOR) 10 MG tablet Take 5 mg by mouth at bedtime.      Calcium Carb-Cholecalciferol (CALCIUM+D3 PO) Take 1 tablet by mouth daily.     Cholecalciferol (VITAMIN D3) 50 MCG (2000 UT) TABS Take 2,000 Units by mouth daily.      ferrous sulfate 324 MG TBEC Take 324 mg by mouth daily with breakfast.     fluticasone (FLONASE) 50 MCG/ACT nasal spray Place 2 sprays into both nostrils daily.     latanoprost (XALATAN) 0.005 % ophthalmic solution Place 1 drop into both eyes at bedtime.      LUTEIN PO Take 1 tablet by mouth daily.     meclizine (ANTIVERT) 12.5 MG tablet Take one tablet by mouth up to four times daily as needed for vertigo 30 tablet 5   metoprolol tartrate (LOPRESSOR) 25 MG tablet Take 0.5 tablets (12.5 mg total) by mouth 2 (two) times daily. 90 tablet 2   Multiple Vitamins-Minerals (PRESERVISION AREDS  2+MULTI VIT) CAPS Take 1 tablet by mouth 2 (two) times daily.     QUEtiapine (SEROQUEL) 25 MG tablet Take 12.5 mg by mouth at bedtime.     SIMBRINZA 1-0.2 % SUSP Place 1 drop into both eyes 3 (three) times daily with meals.      timolol (TIMOPTIC) 0.5 % ophthalmic solution Place 1 drop into both eyes 2 (two) times daily.      methocarbamol (ROBAXIN) 500 MG tablet Take 1 tablet (500 mg total) by mouth 3 (three) times daily as needed for muscle spasms. (Patient not taking: Reported on 06/08/2022) 30 tablet 1   ondansetron (ZOFRAN) 4 MG tablet Take 1 tablet (4 mg total) by mouth every 8 (eight) hours as needed for nausea or vomiting. (Patient not taking: Reported on 06/08/2022) 10 tablet 0   No current facility-administered medications on file prior to visit.    Allergies  Allergen Reactions   Temazepam    Tramadol     Fine when taking it- did not sleep for 6 weeks after stopping medication   Formaldehyde Rash   Nickel Rash     DIAGNOSTIC DATA (LABS, IMAGING, TESTING) - I reviewed patient records, labs, notes, testing and imaging myself where available.  Lab Results  Component Value Date   WBC 7.6 06/08/2022   HGB 11.5 06/08/2022   HCT 35.1 06/08/2022   MCV 92 06/08/2022   PLT 259 06/08/2022      Component Value Date/Time   NA 141 06/08/2022 1633   K 5.2 06/08/2022 1633   CL 105 06/08/2022 1633   CO2 20 06/08/2022 1633   GLUCOSE 103 (H) 06/08/2022 1633   GLUCOSE 111 (  H) 05/28/2020 1431   BUN 20 06/08/2022 1633   CREATININE 0.92 06/08/2022 1633   CREATININE 0.90 06/06/2017 1417   CALCIUM 10.1 06/08/2022 1633   PROT 7.1 02/22/2019 1545   PROT 6.6 10/05/2015 0950   ALBUMIN 4.4 02/22/2019 1545   ALBUMIN 4.2 10/05/2015 0950   AST 21 02/22/2019 1545   ALT 15 02/22/2019 1545   ALKPHOS 49 02/22/2019 1545   BILITOT 0.4 02/22/2019 1545   BILITOT 0.4 10/05/2015 0950   GFRNONAA 54 (L) 05/28/2020 1431   GFRNONAA 61 06/06/2017 1417   GFRAA >60 02/27/2019 0318   GFRAA 71 06/06/2017  1417   Lab Results  Component Value Date   CHOL 192 10/11/2016   HDL 69 10/11/2016   LDLCALC 107 (H) 10/11/2016   TRIG 78 10/11/2016   CHOLHDL 2.8 10/11/2016   No results found for: "HGBA1C" No results found for: "VITAMINB12" Lab Results  Component Value Date   TSH 2.580 06/08/2022    PHYSICAL EXAM:  Today's Vitals   11/22/22 1411  BP: (!) 147/72  Pulse: 68  Weight: 123 lb (55.8 kg)  Height:  (1.626 m)   Body mass index is 21.11 kg/m.   Wt Readings from Last 3 Encounters:  11/22/22 123 lb (55.8 kg)  06/08/22 127 lb 12.8 oz (58 kg)  05/12/21 137 lb (62.1 kg)     Ht Readings from Last 3 Encounters:  11/22/22  (1.626 m)  06/08/22  (1.626 m)  05/12/21  (1.626 m)      General: The patient is awake, alert and appears not in acute distress. The patient is well groomed. Head: Normocephalic, atraumatic. Neck is supple. Mallampati 2,  neck circumference:13" inches . Nasal airflow  patent.    Cardiovascular:  Regular rate and cardiac rhythm by pulse,  without distended neck veins. Respiratory: Lungs are clear to auscultation.  Skin:  Without evidence of ankle edema, or rash. Trunk: The patient's posture is erect.   NEUROLOGIC EXAM: The patient is awake and alert, oriented to place and time.   Memory subjective described as intact.  Attention span & concentration ability appears normal.  Speech is fluent,  without  dysarthria, dysphonia or aphasia.  Mood and affect are appropriate.   Cranial nerves: no loss of smell or taste reported  Pupils are equal in size, not fully rounded,  and not- reactive to light.  Funduscopic exam def- .  Extraocular movements in vertical and horizontal planes were intact and without nystagmus. No Diplopia. Visual fields by finger perimetry are intact. Hearing was intact to soft voice and finger rubbing.    Facial sensation intact to fine touch.  Facial motor strength is symmetric and tongue and uvula move midline.   Neck ROM : rotation, tilt and flexion extension were normal for age and shoulder shrug was symmetrical.    Motor exam:  Symmetric bulk, tone and ROM.   Normal tone without cog- wheeling, symmetric grip strength .   Sensory:  Fine touch and vibration were normal.  Proprioception tested in the upper extremities was normal.   Coordination: Rapid alternating movements in the fingers/hands were of normal speed.  The Finger-to-nose maneuver was intact without evidence of ataxia, dysmetria or tremor.    Gait and station: Patient could rise unassisted from a seated position, walked with a cane - as  assistive device.  Stance is of small width/ base and the patient turned with 4 steps. Not imbalanced at all, did not hold on to the  wall or me.  Toe and heel walk were deferred.  Deep tendon reflexes: in the  upper and lower extremities are symmetric and intact.    Both knees, hips and shoulders replaced !!!! Babinski response was deferred.     ASSESSMENT AND PLAN 87 y.o. year old female  here with:    1) unclear origin of balance, dizziness, tinnitus and ear pressure, daily after 4 Pm , not before. All this started with the death of her spouse, Billey Gosling, 04-26-22.   There is no focal motor weakness, no ataxia , just a cautious gait in a blind person.   2) Grief has exacerbated her Insomnia, which was treated with Seroquel 12.5 mg , no long term effects  d/c  Seroquel. Try trazodone ? [name]  3) Plan start MUCINEX without decongestant, hydration and netty pot.  May help her ear pressure and balance.    I plan to follow up prn either personally or through our NP if symptoms not improve.  CC Soundra Pilon, FNP  After spending a total time of  45  minutes face to face and additional time for physical and neurologic examination, review of laboratory studies,  personal review of imaging studies, reports and results of other testing and review of referral information / records as far as provided in  visit,   Electronically signed by: Melvyn Novas, MD 11/22/2022 2:24 PM  [facility] and [facility] Board certified by The [employer] of Sleep Medicine and Diplomate of the [employer] of Sleep Medicine. Board certified In Neurology through the ABPN, Fellow of the [employer] of Neu[employer]. Medical Director of [facility].

## 2022-11-22 NOTE — Patient Instructions (Addendum)
Trazodone Tablets What is this medication? TRAZODONE (TRAZ oh done) treats depression. It increases the amount of serotonin in the brain, a hormone that helps regulate mood. This medicine may be used for other purposes; ask your health care provider or pharmacist if you have questions. COMMON BRAND NAME(S): Desyrel What should I tell my care team before I take this medication? They need to know if you have any of these conditions: Attempted suicide or thinking about it Bipolar disorder Bleeding problems Glaucoma Heart disease, or previous heart attack Irregular heart beat Kidney or liver disease Low levels of sodium in the blood An unusual or allergic reaction to trazodone, other medications, foods, dyes or preservatives Pregnant or trying to get pregnant Breast-feeding How should I use this medication? Take this medication by mouth with a glass of water. Follow the directions on the prescription label. Take this medication shortly after a meal or a light snack. Take your medication at regular intervals. Do not take your medication more often than directed. Do not stop taking this medication suddenly except upon the advice of your care team. Stopping this medication too quickly may cause serious side effects or your condition may worsen. A special MedGuide will be given to you by the pharmacist with each prescription and refill. Be sure to read this information carefully each time. Talk to your care team regarding the use of this medication in children. Special care may be needed. Overdosage: If you think you have taken too much of this medicine contact a poison control center or emergency room at once. NOTE: This medicine is only for you. Do not share this medicine with others. What if I miss a dose? If you miss a dose, take it as soon as you can. If it is almost time for your next dose, take only that dose. Do not take double or extra doses. What may interact with this medication? Do not  take this medication with any of the following: Certain medications for fungal infections like fluconazole, itraconazole, ketoconazole, posaconazole, voriconazole Cisapride Dronedarone Linezolid MAOIs like Carbex, Eldepryl, Marplan, Nardil, and Parnate Mesoridazine Methylene blue (injected into a vein) Pimozide Saquinavir Thioridazine This medication may also interact with the following: Alcohol Antiviral medications for HIV or AIDS Aspirin and aspirin-like medications Barbiturates like phenobarbital Certain medications for blood pressure, heart disease. Certain medications for depression, anxiety, or psychotic disturbances Certain medications for migraine headache like almotriptan, eletriptan, frovatriptan, naratriptan, rizatriptan, sumatriptan, zolmitriptan Certain medications for seizures like carbamazepine and phenytoin Certain medications for sleep Certain medications that treat or prevent blood clots like dalteparin, enoxaparin, warfarin Digoxin Fentanyl Lithium NSAIDS, medications for pain and inflammation, like ibuprofen or naproxen Other medications that prolong the QT interval (cause an abnormal heart rhythm) like dofetilide Rasagiline Supplements like St. John's wort, kava kava, valerian Tramadol Tryptophan This list may not describe all possible interactions. Give your health care provider a list of all the medicines, herbs, non-prescription drugs, or dietary supplements you use. Also tell them if you smoke, drink alcohol, or use illegal drugs. Some items may interact with your medicine. What should I watch for while using this medication? Tell your care team if your symptoms do not get better or if they get worse. Visit your care team for regular checks on your progress. Because it may take several weeks to see the full effects of this medication, it is important to continue your treatment as prescribed by your care team. Watch for new or worsening thoughts of suicide  or depression.  This includes sudden changes in mood, behaviors, or thoughts. These changes can happen at any time but are more common in the beginning of treatment or after a change in dose. Call your care team right away if you experience these thoughts or worsening depression. Manic episodes may happen in patients with bipolar disorder who take this medication. Watch for changes in feelings or behaviors such as feeling anxious, nervous, agitated, panicky, irritable, hostile, aggressive, impulsive, severely restless, overly excited and hyperactive, or trouble sleeping. These changes can happen at any time but are more common in the beginning of treatment or after a change in dose. Call your care team right away if you notice any of these symptoms. You may get drowsy or dizzy. Do not drive, use machinery, or do anything that needs mental alertness until you know how this medication affects you. Do not stand or sit up quickly, especially if you are an older patient. This reduces the risk of dizzy or fainting spells. Alcohol may interfere with the effect of this medication. Avoid alcoholic drinks. This medication may cause dry eyes and blurred vision. If you wear contact lenses you may feel some discomfort. Lubricating drops may help. See your eye doctor if the problem does not go away or is severe. Your mouth may get dry. Chewing sugarless gum, sucking hard candy and drinking plenty of water may help. Contact your care team if the problem does not go away or is severe. What side effects may I notice from receiving this medication? Side effects that you should report to your care team as soon as possible: Allergic reactions--skin rash, itching, hives, swelling of the face, lips, tongue, or throat Bleeding--bloody or black, tar-like stools, red or dark brown urine, vomiting blood or brown material that looks like coffee grounds, small, red or purple spots on skin, unusual bleeding or bruising Heart rhythm  changes--fast or irregular heartbeat, dizziness, feeling faint or lightheaded, chest pain, trouble breathing Low blood pressure--dizziness, feeling faint or lightheaded, blurry vision Low sodium level--muscle weakness, fatigue, dizziness, headache, confusion Prolonged or painful erection Serotonin syndrome--irritability, confusion, fast or irregular heartbeat, muscle stiffness, twitching muscles, sweating, high fever, seizures, chills, vomiting, diarrhea Sudden eye pain or change in vision such as blurry vision, seeing halos around lights, vision loss Thoughts of suicide or self-harm, worsening mood, feelings of depression Side effects that usually do not require medical attention (report to your care team if they continue or are bothersome): Change in sex drive or performance Constipation Dizziness Drowsiness Dry mouth This list may not describe all possible side effects. Call your doctor for medical advice about side effects. You may report side effects to FDA at 1-800-FDA-1088. Where should I keep my medication? Keep out of the reach of children and pets. Store at room temperature between 15 and 30 degrees C (59 to 86 degrees F). Protect from light. Keep container tightly closed. Throw away any unused medication after the expiration date. NOTE: This sheet is a summary. It may not cover all possible information. If you have questions about this medicine, talk to your doctor, pharmacist, or health care provider.  2023 Elsevier/Gold Standard (2007-09-08 00:00:00)   Insomnia Insomnia is a sleep disorder that makes it difficult to fall asleep or stay asleep. Insomnia can cause fatigue, low energy, difficulty concentrating, mood swings, and poor performance at work or school. There are three different ways to classify insomnia: Difficulty falling asleep. Difficulty staying asleep. Waking up too early in the morning. Any type of insomnia can  be long-term (chronic) or short-term (acute). Both  are common. Short-term insomnia usually lasts for 3 months or less. Chronic insomnia occurs at least three times a week for longer than 3 months. What are the causes? Insomnia may be caused by another condition, situation, or substance, such as: Having certain mental health conditions, such as anxiety and depression. Using caffeine, alcohol, tobacco, or drugs. Having gastrointestinal conditions, such as gastroesophageal reflux disease (GERD). Having certain medical conditions. These include: Asthma. Alzheimer's disease. Stroke. Chronic pain. An overactive thyroid gland (hyperthyroidism). Other sleep disorders, such as restless legs syndrome and sleep apnea. Menopause. Sometimes, the cause of insomnia may not be known. What increases the risk? Risk factors for insomnia include: Gender. Females are affected more often than males. Age. Insomnia is more common as people get older. Stress and certain medical and mental health conditions. Lack of exercise. Having an irregular work schedule. This may include working night shifts and traveling between different time zones. What are the signs or symptoms? If you have insomnia, the main symptom is having trouble falling asleep or having trouble staying asleep. This may lead to other symptoms, such as: Feeling tired or having low energy. Feeling nervous about going to sleep. Not feeling rested in the morning. Having trouble concentrating. Feeling irritable, anxious, or depressed. How is this diagnosed? This condition may be diagnosed based on: Your symptoms and medical history. Your health care provider may ask about: Your sleep habits. Any medical conditions you have. Your mental health. A physical exam. How is this treated? Treatment for insomnia depends on the cause. Treatment may focus on treating an underlying condition that is causing the insomnia. Treatment may also include: Medicines to help you sleep. Counseling or  therapy. Lifestyle adjustments to help you sleep better. Follow these instructions at home: Eating and drinking  Limit or avoid alcohol, caffeinated beverages, and products that contain nicotine and tobacco, especially close to bedtime. These can disrupt your sleep. Do not eat a large meal or eat spicy foods right before bedtime. This can lead to digestive discomfort that can make it hard for you to sleep. Sleep habits  Keep a sleep diary to help you and your health care provider figure out what could be causing your insomnia. Write down: When you sleep. When you wake up during the night. How well you sleep and how rested you feel the next day. Any side effects of medicines you are taking. What you eat and drink. Make your bedroom a dark, comfortable place where it is easy to fall asleep. Put up shades or blackout curtains to block light from outside. Use a white noise machine to block noise. Keep the temperature cool. Limit screen use before bedtime. This includes: Not watching TV. Not using your smartphone, tablet, or computer. Stick to a routine that includes going to bed and waking up at the same times every day and night. This can help you fall asleep faster. Consider making a quiet activity, such as reading, part of your nighttime routine. Try to avoid taking naps during the day so that you sleep better at night. Get out of bed if you are still awake after 15 minutes of trying to sleep. Keep the lights down, but try reading or doing a quiet activity. When you feel sleepy, go back to bed. General instructions Take over-the-counter and prescription medicines only as told by your health care provider. Exercise regularly as told by your health care provider. However, avoid exercising in the hours right before  bedtime. Use relaxation techniques to manage stress. Ask your health care provider to suggest some techniques that may work well for you. These may include: Breathing  exercises. Routines to release muscle tension. Visualizing peaceful scenes. Make sure that you drive carefully. Do not drive if you feel very sleepy. Keep all follow-up visits. This is important. Contact a health care provider if: You are tired throughout the day. You have trouble in your daily routine due to sleepiness. You continue to have sleep problems, or your sleep problems get worse. Get help right away if: You have thoughts about hurting yourself or someone else. Get help right away if you feel like you may hurt yourself or others, or have thoughts about taking your own life. Go to your nearest emergency room or: Call 911. Call the National Suicide Prevention Lifeline at 657-062-1793 or 988. This is open 24 hours a day. Text the Crisis Text Line at 660-574-2353. Summary Insomnia is a sleep disorder that makes it difficult to fall asleep or stay asleep. Insomnia can be long-term (chronic) or short-term (acute). Treatment for insomnia depends on the cause. Treatment may focus on treating an underlying condition that is causing the insomnia. Keep a sleep diary to help you and your health care provider figure out what could be causing your insomnia. This information is not intended to replace advice given to you by your health care provider. Make sure you discuss any questions you have with your health care provider. Document Revised: 06/28/2021 Document Reviewed: 06/28/2021 Elsevier Patient Education  2023 [employer] Prolonged Grief Grief is a normal response to the death of someone close to you. Feelings of fear, anger, and guilt can affect almost everyone who loses a loved one. It is also common to have symptoms of depression while you are grieving. These include problems with sleep, loss of appetite, and lack of energy. They may last for weeks or months after a loss. Prolonged grief is different from normal grief or depression. Normal grieving involves sadness and feelings of loss,  but those feelings get better and heal over time. Prolonged grief is a severe type of grief that lasts for a long time, usually for several months to a year or longer. It interferes with your ability to function normally. Prolonged grief may require treatment from a mental health care provider. What are the causes? The cause of this condition is not known. It is not clear why some people continue to struggle with grief and others do not. What increases the risk? You are more likely to develop this condition if: The death of your loved one was sudden or unexpected. The death of your loved one was due to a violent event. Your loved one died from suicide. Your loved one was a child or a young person. You were very close to your loved one, or you were dependent on him or her. You have a history of depression or anxiety. You have very little to no support from others. What are the signs or symptoms? Symptoms of this condition include: Feeling disbelief or having a lack of emotion (numbness). Being unable to enjoy good memories of your loved one. Needing to avoid anything or anyone that reminds you of your loved one. Being unable to stop thinking about the death. Feeling intense anger, loneliness, helplessness, or guilt. Feeling that your life is meaningless and empty, and having trouble moving on with your life. How is this diagnosed? This condition may be diagnosed based on: Your symptoms. Prolonged  grief will be diagnosed if you have ongoing symptoms of grief for 6 months for children and 12 months or longer for adults. The effect of symptoms on your life. You may be diagnosed with this condition if your symptoms are interfering with your ability to live your life. Your health care provider may recommend that you see a mental health care provider. Many symptoms of depression are similar to the symptoms of prolonged grief. It is important to be evaluated for prolonged grief along with other  mental health conditions. How is this treated? This condition is most commonly treated with talk therapy. This therapy is offered by a mental health specialist (psychiatrist). During therapy: You will learn healthy ways to cope with the loss of your loved one. Your mental health care provider may recommend antidepressant medicines. Follow these instructions at home: Lifestyle  Take care of yourself. Eat on a regular basis, and maintain a healthy diet. Eat plenty of fruits, vegetables, lean protein, and whole grains. Try to get some exercise each day. Aim for 30 minutes of exercise on most days of the week. Keep a consistent sleep schedule. Try to get 8 or more hours of sleep each night. Start doing the things that you used to enjoy. Do not use drugs or alcohol to ease your symptoms. Spend time with friends and loved ones. General instructions Take over-the-counter and prescription medicines only as told by your health care provider. Consider joining a grief (bereavement) support group to help you deal with your loss. Keep all follow-up visits. This is important. Contact a health care provider if: Your symptoms prevent you from functioning normally. Your symptoms do not get better with treatment. Get help right away if: You have serious thoughts about hurting yourself or someone else. You have suicidal feelings. Get help right away if you feel like you may hurt yourself or others, or have thoughts about taking your own life. Go to your nearest emergency room or: Call 911. Call the National Suicide Prevention Lifeline at 934-718-3411 or 988. This is open 24 hours a day. Text the Crisis Text Line at 774-160-9553. Summary Prolonged grief is a severe type of grief that lasts for a long time. This grief is not likely to go away on its own. Get the help you need. Some griefs are more difficult than others and can cause this condition. You may need a certain type of treatment to help you recover  if the loss of your loved one was sudden, violent, or due to suicide. You may feel guilty about moving on with your life. Getting help does not mean that you are forgetting your loved one. It means that you are taking care of yourself. Prolonged grief is best treated with talk therapy. Medicines may also be prescribed. Seek the help you need, and find support that will help you recover. This information is not intended to replace advice given to you by your health care provider. Make sure you discuss any questions you have with your health care provider. Document Revised: 03/08/2021 Document Reviewed: 03/08/2021 Elsevier Patient Education  2023 [employer].

## 2022-11-22 NOTE — Addendum Note (Signed)
Addended by: Melvyn Novas on: 11/22/2022 03:13 PM   Modules accepted: Orders

## 2022-11-30 ENCOUNTER — Telehealth: Payer: Self-pay | Admitting: Neurology

## 2022-11-30 DIAGNOSIS — H540X44 Blindness right eye category 4, blindness left eye category 4: Secondary | ICD-10-CM | POA: Diagnosis not present

## 2022-11-30 NOTE — Telephone Encounter (Signed)
Pt reports that she has taken traZODone (DESYREL) 50 MG tablet , for 6 days and for 6 days she has not been able to sleep.  In addition to that she has gained 3 pounds.  Pt is asking for a call to discuss this, she says she is not taking anymore of this medication.

## 2022-11-30 NOTE — Telephone Encounter (Signed)
Called the patient back. Pt states that she took the trazodone and her sleep was worse. She states that she just can't continue this medication. She would like to go back to the seroquel. I asked who prescribed the seroquel for her and she said Dr Vickey Huger started it several years ago and PCP has been managing. Advised that appears based off the note, Dr Vickey Huger was just trying to start a medication and see if it helped but its not common that she will treat insomnia. Advised that we would recommend that she discuss with PCP about continuing seroquel for sleep concerns. Advised that we could complete a  official sleep consult and do a SS to r/o organic sleep disorder. Advised that if SS negative our next step is typically referring out to CBT. Pt verbalized understanding. She will follow w PCP. She is scheduled to complete therapy next wed.

## 2022-11-30 NOTE — Addendum Note (Signed)
Addended by: Judi Cong on: 11/30/2022 09:02 AM   Modules accepted: Orders

## 2022-12-07 ENCOUNTER — Other Ambulatory Visit: Payer: Self-pay

## 2022-12-07 ENCOUNTER — Encounter: Payer: Self-pay | Admitting: Physical Therapy

## 2022-12-07 ENCOUNTER — Ambulatory Visit: Payer: Medicare Other | Attending: Neurology | Admitting: Physical Therapy

## 2022-12-07 DIAGNOSIS — R2681 Unsteadiness on feet: Secondary | ICD-10-CM | POA: Insufficient documentation

## 2022-12-07 DIAGNOSIS — I480 Paroxysmal atrial fibrillation: Secondary | ICD-10-CM | POA: Insufficient documentation

## 2022-12-07 DIAGNOSIS — I272 Pulmonary hypertension, unspecified: Secondary | ICD-10-CM | POA: Diagnosis not present

## 2022-12-07 DIAGNOSIS — H402233 Chronic angle-closure glaucoma, bilateral, severe stage: Secondary | ICD-10-CM | POA: Insufficient documentation

## 2022-12-07 DIAGNOSIS — F5104 Psychophysiologic insomnia: Secondary | ICD-10-CM | POA: Insufficient documentation

## 2022-12-07 DIAGNOSIS — F4321 Adjustment disorder with depressed mood: Secondary | ICD-10-CM | POA: Insufficient documentation

## 2022-12-07 DIAGNOSIS — R269 Unspecified abnormalities of gait and mobility: Secondary | ICD-10-CM | POA: Diagnosis not present

## 2022-12-07 DIAGNOSIS — M6281 Muscle weakness (generalized): Secondary | ICD-10-CM | POA: Insufficient documentation

## 2022-12-07 NOTE — Therapy (Signed)
OUTPATIENT PHYSICAL THERAPY NEURO EVALUATION   Patient Name: Meredith Moore MRN: [id number] DOB:Dec 13, 1935, 87 y.o., female Today's Date: 12/07/2022   PCP: Peri Maris FNP  REFERRING PROVIDER: Melvyn Novas, MD  END OF SESSION:  PT End of Session - 12/07/22 1501     Visit Number 1    Number of Visits 13    Date for PT Re-Evaluation 01/18/23    Authorization Type U[employer] MCR    Authorization Time Period 12/07/22 to 01/18/23    Progress Note Due on Visit 10    PT Start Time 1402    PT Stop Time 1441    PT Time Calculation (min) 39 min    Activity Tolerance Patient tolerated treatment well    Behavior During Therapy [facility] for tasks assessed/performed             Past Medical History:  Diagnosis Date   Arthritis    AV block, Mobitz 1    Blind 10/12/2016   Has learned Braille   C. difficile colitis    Complication of anesthesia    "Blood pressure has gone too low before and effected vision" just with general   Complication of anesthesia    "woke up in severe panic attack" just with general   Glaucoma    Herpes zoster with unspecified complication    Hip dysplasia    right side   History of sinus tachycardia    History of transfusion 2014   2 units, no reaction-after knee replacement   Hyperlipidemia    under control   Iron deficiency anemia    Macular degeneration    Migraine    hx of    Osteoarthrosis, unspecified whether generalized or localized, unspecified site    PAT (paroxysmal atrial tachycardia)    Premature atrial contractions    PVC's (premature ventricular contractions)    Right hip pain    hx    Past Surgical History:  Procedure Laterality Date   APPENDECTOMY  1979   BELPHAROPTOSIS REPAIR     eye lid drooping repair   COLONOSCOPY     ovarian tumor   1979   benign tumor removed    REVERSE SHOULDER ARTHROPLASTY Left 12/21/2017   Procedure: LEFT REVERSE SHOULDER ARTHROPLASTY;  Surgeon: Francena Hanly, MD;  Location: [facility];  Service:  Orthopedics;  Laterality: Left;   REVERSE SHOULDER ARTHROPLASTY Right 06/04/2020   Procedure: REVERSE SHOULDER ARTHROPLASTY;  Surgeon: Francena Hanly, MD;  Location: [facility];  Service: Orthopedics;  Laterality: Right;    ROBOTIC ASSISTED TOTAL HYSTERECTOMY WITH BILATERAL SALPINGO OOPHERECTOMY Bilateral 03/25/2014   Procedure: ROBOTIC ASSISTED TOTAL HYSTERECTOMY WITH BILATERAL SALPINGO OOPHORECTOMY;  Surgeon: Adolphus Birchwood, MD;  Location: [facility];  Service: Gynecology;  Laterality: Bilateral;   TONSILLECTOMY     1945    TOTAL HIP ARTHROPLASTY Right 06/04/2014   Procedure: RIGHT TOTAL HIP ARTHROPLASTY ANTERIOR APPROACH;  Surgeon: Loanne Drilling, MD;  [facility]: [facility];  Service: Orthopedics;  Laterality: Right;   TOTAL HIP ARTHROPLASTY Left 05/10/2017   Procedure: LEFT TOTAL HIP ARTHROPLASTY ANTERIOR APPROACH;  Surgeon: Ollen Gross, MD;  [facility]: [facility];  Service: Orthopedics;  Laterality: Left;  90 mins   TOTAL KNEE ARTHROPLASTY Right 09/10/2012   Procedure: RIGHT TOTAL KNEE ARTHROPLASTY;  Surgeon: Loanne Drilling, MD;  [facility];  Service: Orthopedics;  Laterality: Right;   TOTAL KNEE ARTHROPLASTY Left 02/25/2019   Procedure: TOTAL KNEE ARTHROPLASTY;  Surgeon: Ollen Gross, MD;  [facility];  Service: Orthopedics;  Laterality: Left;    Patient Active Problem List   Diagnosis Date Noted   Grief reaction 11/22/2022   Chronic insomnia 11/22/2022   Paroxysmal atrial fibrillation (HCC) 11/22/2022   Other secondary pulmonary hypertension (HCC) 05/12/2021   Advanced nonexudative age-related macular degeneration of both eyes with subfoveal involvement 08/06/2020   Degenerative retinal drusen of both eyes 08/06/2020   Optic nerve atrophy 08/06/2020   Primary open angle glaucoma of both eyes, severe stage 08/06/2020   Osteoarthritis of left knee 02/25/2019   Paroxysmal atrial tachycardia 07/03/2018   Hypokalemia 07/03/2018   Normocytic anemia 07/03/2018   Mild renal  insufficiency 07/03/2018   Elevated troponin 07/03/2018   Viral gastroenteritis 07/03/2018   S/p reverse total shoulder arthroplasty 12/21/2017   Blind 10/12/2016   Medication management 10/12/2016   Insomnia 10/07/2015   Glaucoma 10/07/2015   OA (osteoarthritis) of hip 06/04/2014   Macular degeneration 09/18/2013   Osteopenia 09/18/2013   Hyperlipidemia 03/14/2013   OA (osteoarthritis) of knee 09/10/2012    ONSET DATE: end of last year, 04/29/23 to December 2023  REFERRING DIAG: F51.04 (ICD-10-CM) - Chronic insomnia F43.21 (ICD-10-CM) - Grief reaction Z61.0960 (ICD-10-CM) - Chronic primary angle-closure glaucoma of both eyes, severe stage I48.0 (ICD-10-CM) - Paroxysmal atrial fibrillation (HCC) I27.20 (ICD-10-CM) - Pulmonary hypertension, unspecified (HCC) R26.9 (ICD-10-CM) - Gait abnormality  THERAPY DIAG:  Unsteadiness on feet  Muscle weakness (generalized)  Rationale for Evaluation and Treatment: Rehabilitation  SUBJECTIVE:                                                                                                                                                                                             SUBJECTIVE STATEMENT:  My husband passed away the end of 04-29-23, he had dementia and I was able to do all self care and care for my husband without my cane. I was using a cane by Christmas, then my left ear stopped up; I got sent to an ENT, who didn't find anything and cleared all tests including audiology test, but my left ear was still stopped up. I have meclizine but only take it if I'm going on a big outing, I take it PRN and it does help my balance. No falls, no close calls with falling. I did balance rehab at [facility] for 3 months about 3 years ago, I didn't get much out of it.   Pt accompanied by: family member  PERTINENT HISTORY: OA, AV block Mobitz grade 1, blind, hx Cdiff, hip dysplasia, hx sinus tach, HLD, PVCs, reverse TSA, THA, TKR  PAIN:  Are you  having pain? No  PRECAUTIONS: Fall  and Other: blind   WEIGHT BEARING RESTRICTIONS: No  FALLS: Has patient fallen in last 6 months? No  LIVING ENVIRONMENT: Lives with: lives alone but children are in [place] and can help PRN  Lives in: House/apartment Stairs: 4 STE B rails; 13 steps inside home U rail  Has following equipment at home: Single point cane and lifeline   PLOF: Independent, Independent with basic ADLs, Independent with gait, and Independent with transfers  PATIENT GOALS: improve balance, be able to move better in general   OBJECTIVE:     COGNITION: Overall cognitive status: Within functional limits for tasks assessed   SENSATION: WFL          LOWER EXTREMITY MMT:    MMT Right Eval Left Eval  Hip flexion 3+ 3+  Hip extension    Hip abduction 3 3-  Hip adduction    Hip internal rotation    Hip external rotation    Knee flexion 4+ 4+  Knee extension 5 5  Ankle dorsiflexion 5 5  Ankle plantarflexion    Ankle inversion 5 5  Ankle eversion 5 5  (Blank rows = not tested)  BED MOBILITY:  Sit to supine Complete Independence Supine to sit Complete Independence Rolling to Right Complete Independence Rolling to Left Complete Independence  TRANSFERS: Assistive device utilized: Single point cane  Sit to stand: Modified independence Stand to sit: Modified independence Chair to chair: Modified independence       GAIT: Gait pattern: WFL, step through pattern,  + trend, and narrow BOS Distance walked: 352ft Assistive device utilized: Single point cane Level of assistance: Modified independence Comments: slow, cautious gait but appropriate for person with vision deficits in new environment   FUNCTIONAL TESTS:  3 minute walk test: 368ft SPC  Berg Balance Scale: 35/56  PATIENT SURVEYS:  Not in FOTO system at eval   TODAY'S TREATMENT:                                                                                                                               DATE:   Eval  Objective measures/appropriate education, POC, importance of balance interventions in improving fall risk and mobility      PATIENT EDUCATION: Education details: exam findings, POC, importance of balance interventions in improving fall risk and mobility  Person educated: Patient Education method: Explanation Education comprehension: verbalized understanding, returned demonstration, and needs further education  HOME EXERCISE PROGRAM: Will give second session   GOALS: Goals reviewed with patient? Yes  SHORT TERM GOALS: Target date: 12/28/2022    Will be compliant with appropriate progressive HEP  Baseline: Goal status: INITIAL  2.  Will be able to verbalize at least 3 ways to reduce fall risk in context of poor vision in novel and unfamiliar settings  Baseline:  Goal status: INITIAL    LONG TERM GOALS: Target date: 01/18/2023    MMT to improve by at least 1 grade in all weak groups  Baseline:  Goal status: INITIAL  2.  Will score at least 45/56 on Berg balance test to show reduced fall risk  Baseline:  Goal status: INITIAL  3.  Will be able to ambulate over unsteady surfaces such as grass or gravel with SPC and no more than min guard with minimal unsteadiness  Baseline:  Goal status: INITIAL    ASSESSMENT:  CLINICAL IMPRESSION: Patient is a 87 y.o. F who was seen today for physical therapy evaluation and treatment for general gait instability and balance concerns. Of note, she is blind and per note from referring MD as well as behavior in PT eval, has quite a bit of anxiety especially with novel balance tasks and challenges. Exam revealed proximal functional muscle weakness, poor functional balance with Sharlene Motts 35/56, and general high fall risk especially in novel environments. Gait hesitancy and gait speed generally appropriate for person with severe vision impairment in unfamiliar environment. Will benefit from skilled PT services to attempt to  address all impairments and reduce overall fall risk.   OBJECTIVE IMPAIRMENTS: Abnormal gait, decreased activity tolerance, decreased balance, decreased coordination, decreased mobility, difficulty walking, decreased strength, and impaired perceived functional ability.   ACTIVITY LIMITATIONS: stairs, transfers, and locomotion level  PARTICIPATION LIMITATIONS: shopping, community activity, yard work, and church  PERSONAL FACTORS: Age, Behavior pattern, Education, Fitness, Past/current experiences, Profession, Sex, Social background, and Time since onset of injury/illness/exacerbation are also affecting patient's functional outcome.   REHAB POTENTIAL: Fair high anxiety levels, apparent limited buy-in to therapy, self limiting   CLINICAL DECISION MAKING: Evolving/moderate complexity  EVALUATION COMPLEXITY: Moderate  PLAN:  PT FREQUENCY: 1-2x/week  PT DURATION: 6 weeks  PLANNED INTERVENTIONS: Therapeutic exercises, Therapeutic activity, Neuromuscular re-education, Balance training, Gait training, Patient/Family education, Self Care, Joint mobilization, Stair training, Vestibular training, DME instructions, Aquatic Therapy, Electrical stimulation, Manual therapy, and Re-evaluation  PLAN FOR NEXT SESSION: needs HEP. Balance and strengthening focus. Is she interested in water therapy? Wondering if it would help anxiety levels   Nedra Hai PT DPT PN2

## 2022-12-12 NOTE — Therapy (Signed)
OUTPATIENT PHYSICAL THERAPY NEURO TREATMENT   Patient Name: Meredith Moore MRN: [id number] DOB:[birth year], 87 y.o., female Today's Date: 12/13/2022   PCP: Peri Maris FNP  REFERRING PROVIDER: Melvyn Novas, MD  END OF SESSION:  PT End of Session - 12/13/22 1524     Visit Number 2    Number of Visits 13    Date for PT Re-Evaluation 01/18/23    Authorization Type UHC MCR    Authorization Time Period 12/07/22 to 01/18/23    Progress Note Due on Visit 10    PT Start Time 1447    PT Stop Time 1522    PT Time Calculation (min) 35 min    Equipment Utilized During Treatment Gait belt    Activity Tolerance Patient tolerated treatment well    Behavior During Therapy WFL for tasks assessed/performed              Past Medical History:  Diagnosis Date   Arthritis    AV block, Mobitz 1    Blind 10/12/2016   Has learned Braille   C. difficile colitis    Complication of anesthesia    "Blood pressure has gone too low before and effected vision" just with general   Complication of anesthesia    "woke up in severe panic attack" just with general   Glaucoma    Herpes zoster with unspecified complication    Hip dysplasia    right side   History of sinus tachycardia    History of transfusion 2014   2 units, no reaction-after knee replacement   Hyperlipidemia    under control   Iron deficiency anemia    Macular degeneration    Migraine    hx of    Osteoarthrosis, unspecified whether generalized or localized, unspecified site    PAT (paroxysmal atrial tachycardia)    Premature atrial contractions    PVC's (premature ventricular contractions)    Right hip pain    hx    Past Surgical History:  Procedure Laterality Date   APPENDECTOMY  1979   BELPHAROPTOSIS REPAIR     eye lid drooping repair   COLONOSCOPY     ovarian tumor   1979   benign tumor removed    REVERSE SHOULDER ARTHROPLASTY Left 12/21/2017   Procedure: LEFT REVERSE SHOULDER ARTHROPLASTY;  Surgeon:  Francena Hanly, MD;  Location: [facility];  Service: Orthopedics;  Laterality: Left;   REVERSE SHOULDER ARTHROPLASTY Right 06/04/2020   Procedure: REVERSE SHOULDER ARTHROPLASTY;  Surgeon: Francena Hanly, MD;  Location: [facility];  Service: Orthopedics;  Laterality: Right;    ROBOTIC ASSISTED TOTAL HYSTERECTOMY WITH BILATERAL SALPINGO OOPHERECTOMY Bilateral 03/25/2014   Procedure: ROBOTIC ASSISTED TOTAL HYSTERECTOMY WITH BILATERAL SALPINGO OOPHORECTOMY;  Surgeon: Adolphus Birchwood, MD;  Location: [facility];  Service: Gynecology;  Laterality: Bilateral;   TONSILLECTOMY     1945    TOTAL HIP ARTHROPLASTY Right 06/04/2014   Procedure: RIGHT TOTAL HIP ARTHROPLASTY ANTERIOR APPROACH;  Surgeon: Loanne Drilling, MD;  Location: [facility];  Service: Orthopedics;  Laterality: Right;   TOTAL HIP ARTHROPLASTY Left 05/10/2017   Procedure: LEFT TOTAL HIP ARTHROPLASTY ANTERIOR APPROACH;  Surgeon: Ollen Gross, MD;  Location: [facility];  Service: Orthopedics;  Laterality: Left;  90 mins   TOTAL KNEE ARTHROPLASTY Right 09/10/2012   Procedure: RIGHT TOTAL KNEE ARTHROPLASTY;  Surgeon: Loanne Drilling, MD;  Location: [facility];  Service: Orthopedics;  Laterality: Right;   TOTAL KNEE ARTHROPLASTY Left 02/25/2019   Procedure: TOTAL KNEE ARTHROPLASTY;  Surgeon: Lequita Halt,  Homero Fellers, MD;  Location: [facility];  Service: Orthopedics;  Laterality: Left;    Patient Active Problem List   Diagnosis Date Noted   Grief reaction 11/22/2022   Chronic insomnia 11/22/2022   Paroxysmal atrial fibrillation (HCC) 11/22/2022   Other secondary pulmonary hypertension (HCC) 05/12/2021   Advanced nonexudative age-related macular degeneration of both eyes with subfoveal involvement 08/06/2020   Degenerative retinal drusen of both eyes 08/06/2020   Optic nerve atrophy 08/06/2020   Primary open angle glaucoma of both eyes, severe stage 08/06/2020   Osteoarthritis of left knee 02/25/2019   Paroxysmal atrial tachycardia 07/03/2018   Hypokalemia 07/03/2018    Normocytic anemia 07/03/2018   Mild renal insufficiency 07/03/2018   Elevated troponin 07/03/2018   Viral gastroenteritis 07/03/2018   S/p reverse total shoulder arthroplasty 12/21/2017   Blind 10/12/2016   Medication management 10/12/2016   Insomnia 10/07/2015   Glaucoma 10/07/2015   OA (osteoarthritis) of hip 06/04/2014   Macular degeneration 09/18/2013   Osteopenia 09/18/2013   Hyperlipidemia 03/14/2013   OA (osteoarthritis) of knee 09/10/2012    ONSET DATE: end of last year, September to December 2023  REFERRING DIAG: F51.04 (ICD-10-CM) - Chronic insomnia F43.21 (ICD-10-CM) - Grief reaction Z61.0960 (ICD-10-CM) - Chronic primary angle-closure glaucoma of both eyes, severe stage I48.0 (ICD-10-CM) - Paroxysmal atrial fibrillation (HCC) I27.20 (ICD-10-CM) - Pulmonary hypertension, unspecified (HCC) R26.9 (ICD-10-CM) - Gait abnormality  THERAPY DIAG:  Unsteadiness on feet  Muscle weakness (generalized)  Rationale for Evaluation and Treatment: Rehabilitation  SUBJECTIVE:                                                                                                                                                                                             SUBJECTIVE STATEMENT: Doing good- no pain. L ear is still stopped up. Feels dizzy and takes Meclizine- has done so for years. Reports that her hips are weak.   Pt accompanied by: family member  PERTINENT HISTORY: OA, AV block Mobitz grade 1, blind, hx Cdiff, hip dysplasia, hx sinus tach, HLD, PVCs, reverse TSA, THA, TKR  PAIN:  Are you having pain? No  PRECAUTIONS: Fall and Other: blind   WEIGHT BEARING RESTRICTIONS: No  FALLS: Has patient fallen in last 6 months? No  LIVING ENVIRONMENT: Lives with: lives alone but children are in [place] and can help PRN  Lives in: House/apartment Stairs: 4 STE B rails; 13 steps inside home U rail  Has following equipment at home: Single point cane and lifeline   PLOF: Independent,  Independent with basic ADLs, Independent with gait, and Independent with transfers  PATIENT GOALS: improve balance, be able  to move better in general   OBJECTIVE:      TODAY'S TREATMENT: 12/13/22 Activity Comments  resisted march with red TB 20x, with 2# weight 20x Using back of chair for support; pt preferred ankle wts   standing hip ABD 2x10 2# Using back of chair for support; limited control  standing hip EX 2x10 2# Using back of chair for support   mini squat 2x10 Using back of chair for support; cueing to avoid leaning chest forward excessively   Sitting clam red TB 10x, green TB 10x      HOME EXERCISE PROGRAM Last updated: 12/13/23 Access Code: [facility] URL: [email or web address] Date: 12/13/2022 Prepared by: [facility] - Outpatient  Rehab - [facility]  Program Notes all exercises should be done at a counter top for safety. try adjustable ankle weights 1-5 lbs.   Exercises - Standing March with Counter Support  - 1 x daily - 5 x weekly - 2 sets - 10 reps - Standing Hip Abduction with Counter Support  - 1 x daily - 5 x weekly - 2 sets - 10 reps - Standing Hip Extension with Counter Support  - 1 x daily - 5 x weekly - 2 sets - 10 reps - Mini Squat with Counter Support  - 1 x daily - 5 x weekly - 2 sets - 10 reps - Seated Hip Abduction  - 1 x daily - 5 x weekly - 2 sets - 10 reps   PATIENT EDUCATION: Education details: offered pool therapy- patient not interested, edu on low vision clinic- pt reports that she had someone come by the house and she was not interested in their products; edu on vestibular rehab and it's goals as well as edu on Meclizine and it's effects on vestibular therapy Person educated: Patient, daughter Verlon Au  Education method: Explanation, Demonstration, Tactile cues, Verbal cues, and Handouts Education comprehension: verbalized understanding and returned demonstration     Below measures were taken at time of initial evaluation  unless otherwise specified:   COGNITION: Overall cognitive status: Within functional limits for tasks assessed   SENSATION: WFL          LOWER EXTREMITY MMT:    MMT Right Eval Left Eval  Hip flexion 3+ 3+  Hip extension    Hip abduction 3 3-  Hip adduction    Hip internal rotation    Hip external rotation    Knee flexion 4+ 4+  Knee extension 5 5  Ankle dorsiflexion 5 5  Ankle plantarflexion    Ankle inversion 5 5  Ankle eversion 5 5  (Blank rows = not tested)  BED MOBILITY:  Sit to supine Complete Independence Supine to sit Complete Independence Rolling to Right Complete Independence Rolling to Left Complete Independence  TRANSFERS: Assistive device utilized: Single point cane  Sit to stand: Modified independence Stand to sit: Modified independence Chair to chair: Modified independence       GAIT: Gait pattern: WFL, step through pattern,  + trend, and narrow BOS Distance walked: 340ft Assistive device utilized: Single point cane Level of assistance: Modified independence Comments: slow, cautious gait but appropriate for person with vision deficits in new environment   FUNCTIONAL TESTS:  3 minute walk test: 341ft SPC  Berg Balance Scale: 35/56  PATIENT SURVEYS:  Not in FOTO system at eval   TODAY'S TREATMENT:  DATE:   Eval  Objective measures/appropriate education, POC, importance of balance interventions in improving fall risk and mobility      PATIENT EDUCATION: Education details: exam findings, POC, importance of balance interventions in improving fall risk and mobility  Person educated: Patient Education method: Explanation Education comprehension: verbalized understanding, returned demonstration, and needs further education  HOME EXERCISE PROGRAM: Will give second session   GOALS: Goals reviewed with  patient? Yes  SHORT TERM GOALS: Target date: 12/28/2022    Will be compliant with appropriate progressive HEP  Baseline: Goal status: IN PROGRESS  2.  Will be able to verbalize at least 3 ways to reduce fall risk in context of poor vision in novel and unfamiliar settings  Baseline:  Goal status: IN PROGRESS    LONG TERM GOALS: Target date: 01/18/2023    MMT to improve by at least 1 grade in all weak groups  Baseline:  Goal status: IN PROGRESS  2.  Will score at least 45/56 on Berg balance test to show reduced fall risk  Baseline:  Goal status: IN PROGRESS  3.  Will be able to ambulate over unsteady surfaces such as grass or gravel with SPC and no more than min guard with minimal unsteadiness  Baseline:  Goal status: IN PROGRESS    ASSESSMENT:  CLINICAL IMPRESSION: Patient arrived to session without complaints. Initiated standing LE strengthening exercise program using ankle weights. Patient tolerated these exercises well and without complaints - will be able to do these safely on her own at home. Provided education on long-term effects of Meclizine as she reports taking this for years and plans for vestibular assessment next session. No complaints upon leaving.   OBJECTIVE IMPAIRMENTS: Abnormal gait, decreased activity tolerance, decreased balance, decreased coordination, decreased mobility, difficulty walking, decreased strength, and impaired perceived functional ability.   ACTIVITY LIMITATIONS: stairs, transfers, and locomotion level  PARTICIPATION LIMITATIONS: shopping, community activity, yard work, and church  PERSONAL FACTORS: Age, Behavior pattern, Education, Fitness, Past/current experiences, Profession, Sex, Social background, and Time since onset of injury/illness/exacerbation are also affecting patient's functional outcome.   REHAB POTENTIAL: Fair high anxiety levels, apparent limited buy-in to therapy, self limiting   CLINICAL DECISION MAKING:  Evolving/moderate complexity  EVALUATION COMPLEXITY: Moderate  PLAN:  PT FREQUENCY: 1-2x/week  PT DURATION: 6 weeks  PLANNED INTERVENTIONS: Therapeutic exercises, Therapeutic activity, Neuromuscular re-education, Balance training, Gait training, Patient/Family education, Self Care, Joint mobilization, Stair training, Vestibular training, DME instructions, Aquatic Therapy, Electrical stimulation, Manual therapy, and Re-evaluation  PLAN FOR NEXT SESSION: vestibular assessment next session;  Balance and strengthening focus. I   Anette Guarneri, PT, DPT 12/13/22 3:26 PM  [facility] [address] [place], [address] [place], [place] [address] Phone # [phone] Fax # [phone]

## 2022-12-13 ENCOUNTER — Ambulatory Visit: Payer: Medicare Other | Admitting: Physical Therapy

## 2022-12-13 ENCOUNTER — Encounter: Payer: Self-pay | Admitting: Physical Therapy

## 2022-12-13 DIAGNOSIS — R269 Unspecified abnormalities of gait and mobility: Secondary | ICD-10-CM | POA: Diagnosis not present

## 2022-12-13 DIAGNOSIS — M6281 Muscle weakness (generalized): Secondary | ICD-10-CM

## 2022-12-13 DIAGNOSIS — I480 Paroxysmal atrial fibrillation: Secondary | ICD-10-CM | POA: Diagnosis not present

## 2022-12-13 DIAGNOSIS — H402233 Chronic angle-closure glaucoma, bilateral, severe stage: Secondary | ICD-10-CM | POA: Diagnosis not present

## 2022-12-13 DIAGNOSIS — R2681 Unsteadiness on feet: Secondary | ICD-10-CM | POA: Diagnosis not present

## 2022-12-13 DIAGNOSIS — I272 Pulmonary hypertension, unspecified: Secondary | ICD-10-CM | POA: Diagnosis not present

## 2022-12-14 NOTE — Therapy (Signed)
OUTPATIENT PHYSICAL THERAPY NEURO TREATMENT   Patient Name: Meredith Moore MRN: [id number] DOB:[birth year], 87 y.o., female Today's Date: 12/15/2022   PCP: Peri Maris FNP  REFERRING PROVIDER: Melvyn Novas, MD  END OF SESSION:  PT End of Session - 12/15/22 1700     Visit Number 3    Number of Visits 13    Date for PT Re-Evaluation 01/18/23    Authorization Type [employer] MCR    Authorization Time Period 12/07/22 to 01/18/23    Progress Note Due on Visit 10    PT Start Time 1620    PT Stop Time 1658    PT Time Calculation (min) 38 min    Equipment Utilized During Treatment Gait belt    Activity Tolerance Patient tolerated treatment well    Behavior During Therapy WFL for tasks assessed/performed               Past Medical History:  Diagnosis Date   Arthritis    AV block, Mobitz 1    Blind 10/12/2016   Has learned Braille   C. difficile colitis    Complication of anesthesia    "Blood pressure has gone too low before and effected vision" just with general   Complication of anesthesia    "woke up in severe panic attack" just with general   Glaucoma    Herpes zoster with unspecified complication    Hip dysplasia    right side   History of sinus tachycardia    History of transfusion 2014   2 units, no reaction-after knee replacement   Hyperlipidemia    under control   Iron deficiency anemia    Macular degeneration    Migraine    hx of    Osteoarthrosis, unspecified whether generalized or localized, unspecified site    PAT (paroxysmal atrial tachycardia)    Premature atrial contractions    PVC's (premature ventricular contractions)    Right hip pain    hx    Past Surgical History:  Procedure Laterality Date   APPENDECTOMY  1979   BELPHAROPTOSIS REPAIR     eye lid drooping repair   COLONOSCOPY     ovarian tumor   1979   benign tumor removed    REVERSE SHOULDER ARTHROPLASTY Left 12/21/2017   Procedure: LEFT REVERSE SHOULDER ARTHROPLASTY;  Surgeon:  Francena Hanly, MD;  Location: [facility];  Service: Orthopedics;  Laterality: Left;   REVERSE SHOULDER ARTHROPLASTY Right 06/04/2020   Procedure: REVERSE SHOULDER ARTHROPLASTY;  Surgeon: Francena Hanly, MD;  Location: [facility];  Service: Orthopedics;  Laterality: Right;    ROBOTIC ASSISTED TOTAL HYSTERECTOMY WITH BILATERAL SALPINGO OOPHERECTOMY Bilateral 03/25/2014   Procedure: ROBOTIC ASSISTED TOTAL HYSTERECTOMY WITH BILATERAL SALPINGO OOPHORECTOMY;  Surgeon: Adolphus Birchwood, MD;  Location: [facility];  Service: Gynecology;  Laterality: Bilateral;   TONSILLECTOMY     1945    TOTAL HIP ARTHROPLASTY Right 06/04/2014   Procedure: RIGHT TOTAL HIP ARTHROPLASTY ANTERIOR APPROACH;  Surgeon: Loanne Drilling, MD;  Location: [facility];  Service: Orthopedics;  Laterality: Right;   TOTAL HIP ARTHROPLASTY Left 05/10/2017   Procedure: LEFT TOTAL HIP ARTHROPLASTY ANTERIOR APPROACH;  Surgeon: Ollen Gross, MD;  Location: [facility];  Service: Orthopedics;  Laterality: Left;  90 mins   TOTAL KNEE ARTHROPLASTY Right 09/10/2012   Procedure: RIGHT TOTAL KNEE ARTHROPLASTY;  Surgeon: Loanne Drilling, MD;  Location: [facility];  Service: Orthopedics;  Laterality: Right;   TOTAL KNEE ARTHROPLASTY Left 02/25/2019   Procedure: TOTAL KNEE ARTHROPLASTY;  Surgeon:  Ollen Gross, MD;  Location: [facility];  Service: Orthopedics;  Laterality: Left;    Patient Active Problem List   Diagnosis Date Noted   Grief reaction 11/22/2022   Chronic insomnia 11/22/2022   Paroxysmal atrial fibrillation (HCC) 11/22/2022   Other secondary pulmonary hypertension (HCC) 05/12/2021   Advanced nonexudative age-related macular degeneration of both eyes with subfoveal involvement 08/06/2020   Degenerative retinal drusen of both eyes 08/06/2020   Optic nerve atrophy 08/06/2020   Primary open angle glaucoma of both eyes, severe stage 08/06/2020   Osteoarthritis of left knee 02/25/2019   Paroxysmal atrial tachycardia 07/03/2018   Hypokalemia 07/03/2018    Normocytic anemia 07/03/2018   Mild renal insufficiency 07/03/2018   Elevated troponin 07/03/2018   Viral gastroenteritis 07/03/2018   S/p reverse total shoulder arthroplasty 12/21/2017   Blind 10/12/2016   Medication management 10/12/2016   Insomnia 10/07/2015   Glaucoma 10/07/2015   OA (osteoarthritis) of hip 06/04/2014   Macular degeneration 09/18/2013   Osteopenia 09/18/2013   Hyperlipidemia 03/14/2013   OA (osteoarthritis) of knee 09/10/2012    ONSET DATE: end of last year, September to December 2023  REFERRING DIAG: F51.04 (ICD-10-CM) - Chronic insomnia F43.21 (ICD-10-CM) - Grief reaction W09.8119 (ICD-10-CM) - Chronic primary angle-closure glaucoma of both eyes, severe stage I48.0 (ICD-10-CM) - Paroxysmal atrial fibrillation (HCC) I27.20 (ICD-10-CM) - Pulmonary hypertension, unspecified (HCC) R26.9 (ICD-10-CM) - Gait abnormality  THERAPY DIAG:  Unsteadiness on feet  Muscle weakness (generalized)  Rationale for Evaluation and Treatment: Rehabilitation  SUBJECTIVE:                                                                                                                                                                                             SUBJECTIVE STATEMENT: Has done her exercises and denies issues. Ankle weights have not come in yet.   Pt accompanied by: family member  PERTINENT HISTORY: OA, AV block Mobitz grade 1, blind, hx Cdiff, hip dysplasia, hx sinus tach, HLD, PVCs, reverse TSA, THA, TKR  PAIN:  Are you having pain? No  PRECAUTIONS: Fall and Other: blind   WEIGHT BEARING RESTRICTIONS: No  FALLS: Has patient fallen in last 6 months? No  LIVING ENVIRONMENT: Lives with: lives alone but children are in [place] and can help PRN  Lives in: House/apartment Stairs: 4 STE B rails; 13 steps inside home U rail  Has following equipment at home: Single point cane and lifeline   PLOF: Independent, Independent with basic ADLs, Independent with gait, and  Independent with transfers  PATIENT GOALS: improve balance, be able to move better in general   OBJECTIVE:  VESTIBULAR ASSESSMENT    SYMPTOM BEHAVIOR:   Subjective history: dizziness and sensation of L ear fullness has occurred since christmas; reports that she had workup by ENT and cardiac workup without acute changes. Reports increased imbalance sine then as well.    Non-Vestibular symptoms: tinnitus and migraine symptoms no migraines in several decades    Type of dizziness: Spinning/Vertigo "when I stop, the room keeps moving"   Frequency: occasionally    Duration: seconds   Aggravating factors: turns in the kitchen, head turns   Progression of symptoms: unchanged   FRENZEL - FIXATION SUPRESSED: (technically testing this as patient is blind)   Ocular Alignment: normal   Spontaneous Nystagmus: absent   Gaze-Induced Nystagmus: absent    POSITIONAL TESTING:  Right Roll Test: negative;  Left Roll Test: negative Right Sidelying: negative Left Sidelying: c/o mild wooziness upon sitting up   Standing head turns 30" with light UE support on chair:  c/o mild dizziness and sensation of lag  Standing head nods 30" with light UE support on chair:  c/o mild dizziness and sensation of lag with cervical flexion  Standing 1/4 and 1/2 turns at counter and with chair for support: good quick speed, c/o dizziness to L side  Romberg: mild-mod sway    HOME EXERCISE PROGRAM Last updated: 12/15/23 Access Code: [facility] URL: [email or web address] Date: 12/15/2022 Prepared by: [facility]  [facility] - [facility]  Program Notes all exercises should be done at a counter top for safety. try adjustable ankle weights 1-5 lbs.   Exercises - Standing March with Counter Support  - 1 x daily - 5 x weekly - 2 sets - 10 reps - Standing Hip Abduction with Counter Support  - 1 x daily - 5 x weekly - 2 sets - 10 reps - Standing Hip Extension with Counter Support  - 1 x daily  - 5 x weekly - 2 sets - 10 reps - Mini Squat with Counter Support  - 1 x daily - 5 x weekly - 2 sets - 10 reps - Seated Hip Abduction  - 1 x daily - 5 x weekly - 2 sets - 10 reps - Standing with Head Nod  - 1 x daily - 5 x weekly - 2-3 sets - 30 sec hold - Standing with Head Rotation  - 1 x daily - 5 x weekly - 2-3 sets - 30 sec hold - 180 Degree Pivot Turn with Single Point Cane  - 1 x daily - 5 x weekly - 2 sets - 10 reps - Narrow Stance with Counter Support  - 1 x daily - 5 x weekly - 2 sets - 30 sec hold   PATIENT EDUCATION: Education details: edu on pt likely going to take longer to recover from dizziness d/t lack of visual system to compensate, HEP update and edu on vestibular hypofunction as patient's symptoms align with this condition  Person educated: Patient, daughter  Education method: Explanation, Demonstration, Tactile cues, Verbal cues, and Handouts Education comprehension: verbalized understanding and returned demonstration     Below measures were taken at time of initial evaluation unless otherwise specified:   COGNITION: Overall cognitive status: Within functional limits for tasks assessed   SENSATION: WFL          LOWER EXTREMITY MMT:    MMT Right Eval Left Eval  Hip flexion 3+ 3+  Hip extension    Hip abduction 3 3-  Hip adduction    Hip internal rotation  Hip external rotation    Knee flexion 4+ 4+  Knee extension 5 5  Ankle dorsiflexion 5 5  Ankle plantarflexion    Ankle inversion 5 5  Ankle eversion 5 5  (Blank rows = not tested)  BED MOBILITY:  Sit to supine Complete Independence Supine to sit Complete Independence Rolling to Right Complete Independence Rolling to Left Complete Independence  TRANSFERS: Assistive device utilized: Single point cane  Sit to stand: Modified independence Stand to sit: Modified independence Chair to chair: Modified independence       GAIT: Gait pattern: WFL, step through pattern,  + trend, and  narrow BOS Distance walked: 3106ft Assistive device utilized: Single point cane Level of assistance: Modified independence Comments: slow, cautious gait but appropriate for person with vision deficits in new environment   FUNCTIONAL TESTS:  3 minute walk test: 381ft SPC  Berg Balance Scale: 35/56  PATIENT SURVEYS:  Not in FOTO system at eval   TODAY'S TREATMENT:                                                                                                                              DATE:   Eval  Objective measures/appropriate education, POC, importance of balance interventions in improving fall risk and mobility      PATIENT EDUCATION: Education details: exam findings, POC, importance of balance interventions in improving fall risk and mobility  Person educated: Patient Education method: Explanation Education comprehension: verbalized understanding, returned demonstration, and needs further education  HOME EXERCISE PROGRAM: Will give second session   GOALS: Goals reviewed with patient? Yes  SHORT TERM GOALS: Target date: 12/28/2022    Will be compliant with appropriate progressive HEP  Baseline: Goal status: MET 12/15/22  2.  Will be able to verbalize at least 3 ways to reduce fall risk in context of poor vision in novel and unfamiliar settings  Baseline:  Goal status: IN PROGRESS    LONG TERM GOALS: Target date: 01/18/2023    MMT to improve by at least 1 grade in all weak groups  Baseline:  Goal status: IN PROGRESS  2.  Will score at least 45/56 on Berg balance test to show reduced fall risk  Baseline:  Goal status: IN PROGRESS  3.  Will be able to ambulate over unsteady surfaces such as grass or gravel with SPC and no more than min guard with minimal unsteadiness  Baseline:  Goal status: IN PROGRESS    ASSESSMENT:  CLINICAL IMPRESSION: Patient arrived to session without new complaints. Reports dizziness with head and body turns. Exam today revealed  motion sensitivity to L side. Symptoms were replicated with head turns, nods, and body turns today, thus updated these as habituation into HEP. Patient was also able to safely perform a balance activity with some sway evident- advised to perform this in a corner for safety. Patient report understanding and without complaint upon leaving.   OBJECTIVE IMPAIRMENTS: Abnormal gait, decreased activity tolerance, decreased  balance, decreased coordination, decreased mobility, difficulty walking, decreased strength, and impaired perceived functional ability.   ACTIVITY LIMITATIONS: stairs, transfers, and locomotion level  PARTICIPATION LIMITATIONS: shopping, community activity, yard work, and church  PERSONAL FACTORS: Age, Behavior pattern, Education, Fitness, Past/current experiences, Profession, Sex, Social background, and Time since onset of injury/illness/exacerbation are also affecting patient's functional outcome.   REHAB POTENTIAL: Fair high anxiety levels, apparent limited buy-in to therapy, self limiting   CLINICAL DECISION MAKING: Evolving/moderate complexity  EVALUATION COMPLEXITY: Moderate  PLAN:  PT FREQUENCY: 1-2x/week  PT DURATION: 6 weeks  PLANNED INTERVENTIONS: Therapeutic exercises, Therapeutic activity, Neuromuscular re-education, Balance training, Gait training, Patient/Family education, Self Care, Joint mobilization, Stair training, Vestibular training, DME instructions, Aquatic Therapy, Electrical stimulation, Manual therapy, and Re-evaluation  PLAN FOR NEXT SESSION: habituation, Balance and strengthening focus.    Anette Guarneri, PT, DPT 12/15/22 5:04 PM  [facility] [address] [place], [address] [place], [place] [address] Phone # [phone] Fax # [phone]

## 2022-12-15 ENCOUNTER — Ambulatory Visit: Payer: Medicare Other | Admitting: Physical Therapy

## 2022-12-15 ENCOUNTER — Encounter: Payer: Self-pay | Admitting: Physical Therapy

## 2022-12-15 DIAGNOSIS — I480 Paroxysmal atrial fibrillation: Secondary | ICD-10-CM | POA: Diagnosis not present

## 2022-12-15 DIAGNOSIS — M6281 Muscle weakness (generalized): Secondary | ICD-10-CM

## 2022-12-15 DIAGNOSIS — H402233 Chronic angle-closure glaucoma, bilateral, severe stage: Secondary | ICD-10-CM | POA: Diagnosis not present

## 2022-12-15 DIAGNOSIS — R2681 Unsteadiness on feet: Secondary | ICD-10-CM

## 2022-12-15 DIAGNOSIS — R269 Unspecified abnormalities of gait and mobility: Secondary | ICD-10-CM | POA: Diagnosis not present

## 2022-12-15 DIAGNOSIS — I272 Pulmonary hypertension, unspecified: Secondary | ICD-10-CM | POA: Diagnosis not present

## 2022-12-27 NOTE — Therapy (Signed)
OUTPATIENT PHYSICAL THERAPY NEURO TREATMENT   Patient Name: Meredith Moore MRN: [id number] DOB:12-24-1935, 87 y.o., female Today's Date: 12/28/2022   PCP: Peri Maris FNP  REFERRING PROVIDER: Melvyn Novas, MD  END OF SESSION:  PT End of Session - 12/28/22 1529     Visit Number 4    Number of Visits 13    Date for PT Re-Evaluation 01/18/23    Authorization Type [employer] MCR    Authorization Time Period 12/07/22 to 01/18/23    Progress Note Due on Visit 10    PT Start Time 1450    PT Stop Time 1528    PT Time Calculation (min) 38 min    Equipment Utilized During Treatment Gait belt    Activity Tolerance Patient tolerated treatment well    Behavior During Therapy WFL for tasks assessed/performed                Past Medical History:  Diagnosis Date   Arthritis    AV block, Mobitz 1    Blind 10/12/2016   Has learned Braille   C. difficile colitis    Complication of anesthesia    "Blood pressure has gone too low before and effected vision" just with general   Complication of anesthesia    "woke up in severe panic attack" just with general   Glaucoma    Herpes zoster with unspecified complication    Hip dysplasia    right side   History of sinus tachycardia    History of transfusion 2014   2 units, no reaction-after knee replacement   Hyperlipidemia    under control   Iron deficiency anemia    Macular degeneration    Migraine    hx of    Osteoarthrosis, unspecified whether generalized or localized, unspecified site    PAT (paroxysmal atrial tachycardia)    Premature atrial contractions    PVC's (premature ventricular contractions)    Right hip pain    hx    Past Surgical History:  Procedure Laterality Date   APPENDECTOMY  1979   BELPHAROPTOSIS REPAIR     eye lid drooping repair   COLONOSCOPY     ovarian tumor   1979   benign tumor removed    REVERSE SHOULDER ARTHROPLASTY Left 12/21/2017   Procedure: LEFT REVERSE SHOULDER ARTHROPLASTY;   Surgeon: Francena Hanly, MD;  Location: [facility];  Service: Orthopedics;  Laterality: Left;   REVERSE SHOULDER ARTHROPLASTY Right 06/04/2020   Procedure: REVERSE SHOULDER ARTHROPLASTY;  Surgeon: Francena Hanly, MD;  Location: [facility];  Service: Orthopedics;  Laterality: Right;    ROBOTIC ASSISTED TOTAL HYSTERECTOMY WITH BILATERAL SALPINGO OOPHERECTOMY Bilateral 03/25/2014   Procedure: ROBOTIC ASSISTED TOTAL HYSTERECTOMY WITH BILATERAL SALPINGO OOPHORECTOMY;  Surgeon: Adolphus Birchwood, MD;  Location: [facility];  Service: Gynecology;  Laterality: Bilateral;   TONSILLECTOMY     1945    TOTAL HIP ARTHROPLASTY Right 06/04/2014   Procedure: RIGHT TOTAL HIP ARTHROPLASTY ANTERIOR APPROACH;  Surgeon: Loanne Drilling, MD;  Location: [facility];  Service: Orthopedics;  Laterality: Right;   TOTAL HIP ARTHROPLASTY Left 05/10/2017   Procedure: LEFT TOTAL HIP ARTHROPLASTY ANTERIOR APPROACH;  Surgeon: Ollen Gross, MD;  Location: [facility];  Service: Orthopedics;  Laterality: Left;  90 mins   TOTAL KNEE ARTHROPLASTY Right 09/10/2012   Procedure: RIGHT TOTAL KNEE ARTHROPLASTY;  Surgeon: Loanne Drilling, MD;  Location: [facility];  Service: Orthopedics;  Laterality: Right;   TOTAL KNEE ARTHROPLASTY Left 02/25/2019   Procedure: TOTAL KNEE ARTHROPLASTY;  Surgeon: Ollen Gross, MD;  Location: [facility];  Service: Orthopedics;  Laterality: Left;    Patient Active Problem List   Diagnosis Date Noted   Grief reaction 11/22/2022   Chronic insomnia 11/22/2022   Paroxysmal atrial fibrillation (HCC) 11/22/2022   Other secondary pulmonary hypertension (HCC) 05/12/2021   Advanced nonexudative age-related macular degeneration of both eyes with subfoveal involvement 08/06/2020   Degenerative retinal drusen of both eyes 08/06/2020   Optic nerve atrophy 08/06/2020   Primary open angle glaucoma of both eyes, severe stage 08/06/2020   Osteoarthritis of left knee 02/25/2019   Paroxysmal atrial tachycardia 07/03/2018   Hypokalemia 07/03/2018    Normocytic anemia 07/03/2018   Mild renal insufficiency 07/03/2018   Elevated troponin 07/03/2018   Viral gastroenteritis 07/03/2018   S/p reverse total shoulder arthroplasty 12/21/2017   Blind 10/12/2016   Medication management 10/12/2016   Insomnia 10/07/2015   Glaucoma 10/07/2015   OA (osteoarthritis) of hip 06/04/2014   Macular degeneration 09/18/2013   Osteopenia 09/18/2013   Hyperlipidemia 03/14/2013   OA (osteoarthritis) of knee 09/10/2012    ONSET DATE: end of last year, September to December 2023  REFERRING DIAG: F51.04 (ICD-10-CM) - Chronic insomnia F43.21 (ICD-10-CM) - Grief reaction Z61.0960 (ICD-10-CM) - Chronic primary angle-closure glaucoma of both eyes, severe stage I48.0 (ICD-10-CM) - Paroxysmal atrial fibrillation (HCC) I27.20 (ICD-10-CM) - Pulmonary hypertension, unspecified (HCC) R26.9 (ICD-10-CM) - Gait abnormality  THERAPY DIAG:  Unsteadiness on feet  Muscle weakness (generalized)  Rationale for Evaluation and Treatment: Rehabilitation  SUBJECTIVE:                                                                                                                                                                                             SUBJECTIVE STATEMENT: Been exercising like you told me to. They are going good. Reports less dizziness, but feeling unsteady. Has been walking in the house with cane less often. Reports that she is not having as much of a "lag" with turns in the kitchen. Reports improved LE strength and now able to perform step through stair navigation. Reports some remaining uneasiness with bending to pick items off the floor or low shelf.    Pt accompanied by: family member  PERTINENT HISTORY: OA, AV block Mobitz grade 1, blind, hx Cdiff, hip dysplasia, hx sinus tach, HLD, PVCs, reverse TSA, THA, TKR  PAIN:  Are you having pain? No  PRECAUTIONS: Fall and Other: blind   WEIGHT BEARING RESTRICTIONS: No  FALLS: Has patient fallen in last  6 months? No  LIVING ENVIRONMENT: Lives with: lives alone but children are in [place] and can help PRN  Lives in: House/apartment Stairs: 4 STE B rails; 13 steps inside home U rail  Has following equipment at home: Single point cane and lifeline   PLOF: Independent, Independent with basic ADLs, Independent with gait, and Independent with transfers  PATIENT GOALS: improve balance, be able to move better in general   OBJECTIVE:      TODAY'S TREATMENT: 12/28/22 Activity Comments  Sitting anterior habituation 3x each side C/o dizziness sitting up from B sides  standing head nods 2x30"  Quick pace; hands on back of chair; no dizziness   standing head rotation 30" Quick pace; hands on back of chair; no dizziness   Romberg head nods and turns 30"  Quick pace; fingertips on back of chair; no dizziness   1/2 turns  Quick pace,  dizziness   alt posterior step 2x10 Light fingertip on chair  alt side step 2x10 Light fingertip on chair    PATIENT EDUCATION: Education details: edu on use of Sports administrator , HEP update with edu for safety Person educated: Patient and Child(ren) Education method: Explanation, Demonstration, Tactile cues, Verbal cues, and Handouts Education comprehension: verbalized understanding and returned demonstration    HOME EXERCISE PROGRAM Access Code: [facility] URL: [email or web address] Date: 12/28/2022 Prepared by: [facility] Outpatient  Rehab - [facility]  Program Notes all exercises should be done at a counter top for safety. try adjustable ankle weights 1-5 lbs.   Exercises - Standing March with Counter Support  - 1 x daily - 5 x weekly - 2 sets - 10 reps - Standing Hip Abduction with Counter Support  - 1 x daily - 5 x weekly - 2 sets - 10 reps - Standing Hip Extension with Counter Support  - 1 x daily - 5 x weekly - 2 sets - 10 reps - Mini Squat with Counter Support  - 1 x daily - 5 x weekly - 2 sets - 10 reps - Seated Hip Abduction  - 1 x  daily - 5 x weekly - 2 sets - 10 reps - Standing with Head Nod  - 1 x daily - 5 x weekly - 2-3 sets - 30 sec hold - Standing with Head Rotation  - 1 x daily - 5 x weekly - 2-3 sets - 30 sec hold - Seated Nose to Right Knee Vestibular Habituation  - 2 x daily - 7 x weekly - 1-2 sets - 3-5 reps - 10 sec hold - Alternating Step Backward with Support  - 1 x daily - 5 x weekly - 2 sets - 10 reps - Side Stepping with Unilateral Counter Support  - 1 x daily - 5 x weekly - 2 sets - 10 reps    Below measures were taken at time of initial evaluation unless otherwise specified:   COGNITION: Overall cognitive status: Within functional limits for tasks assessed   SENSATION: WFL          LOWER EXTREMITY MMT:    MMT Right Eval Left Eval  Hip flexion 3+ 3+  Hip extension    Hip abduction 3 3-  Hip adduction    Hip internal rotation    Hip external rotation    Knee flexion 4+ 4+  Knee extension 5 5  Ankle dorsiflexion 5 5  Ankle plantarflexion    Ankle inversion 5 5  Ankle eversion 5 5  (Blank rows = not tested)  BED MOBILITY:  Sit to supine Complete Independence Supine to sit Complete Independence Rolling to Right Complete Independence Rolling  to Left Complete Independence  TRANSFERS: Assistive device utilized: Single point cane  Sit to stand: Modified independence Stand to sit: Modified independence Chair to chair: Modified independence       GAIT: Gait pattern: WFL, step through pattern,  + trend, and narrow BOS Distance walked: 326ft Assistive device utilized: Single point cane Level of assistance: Modified independence Comments: slow, cautious gait but appropriate for person with vision deficits in new environment   FUNCTIONAL TESTS:  3 minute walk test: 32ft SPC  Berg Balance Scale: 35/56  PATIENT SURVEYS:  Not in FOTO system at eval   TODAY'S TREATMENT:                                                                                                                               DATE:   Eval  Objective measures/appropriate education, POC, importance of balance interventions in improving fall risk and mobility      PATIENT EDUCATION: Education details: exam findings, POC, importance of balance interventions in improving fall risk and mobility  Person educated: Patient Education method: Explanation Education comprehension: verbalized understanding, returned demonstration, and needs further education  HOME EXERCISE PROGRAM: Will give second session   GOALS: Goals reviewed with patient? Yes  SHORT TERM GOALS: Target date: 12/28/2022    Will be compliant with appropriate progressive HEP  Baseline: Goal status: MET 12/15/22  2.  Will be able to verbalize at least 3 ways to reduce fall risk in context of poor vision in novel and unfamiliar settings  Baseline:  Goal status: IN PROGRESS    LONG TERM GOALS: Target date: 01/18/2023    MMT to improve by at least 1 grade in all weak groups  Baseline:  Goal status: IN PROGRESS  2.  Will score at least 45/56 on Berg balance test to show reduced fall risk  Baseline:  Goal status: IN PROGRESS  3.  Will be able to ambulate over unsteady surfaces such as grass or gravel with SPC and no more than min guard with minimal unsteadiness  Baseline:  Goal status: IN PROGRESS    ASSESSMENT:  CLINICAL IMPRESSION: Patient arrived to session with report of improved tolerance for turns and stair navigation but notes remaining apprehension with forward bending. Initiated forward bending habituation activities in sitting for safety- patient noted dizziness but was safe performing this in seated position. Reviewed habituation head movements which no longer brought on dizziness but pt able to tolerate progression of additional balance challenges with these activities. Updated and consolidated HEP with exercises that were safely performed today. Patient reported understanding and without complaints  upon leaving.   OBJECTIVE IMPAIRMENTS: Abnormal gait, decreased activity tolerance, decreased balance, decreased coordination, decreased mobility, difficulty walking, decreased strength, and impaired perceived functional ability.   ACTIVITY LIMITATIONS: stairs, transfers, and locomotion level  PARTICIPATION LIMITATIONS: shopping, community activity, yard work, and church  PERSONAL FACTORS: Age, Behavior pattern, Education, Fitness, Past/current experiences, Profession, Sex, Social background, and Time  since onset of injury/illness/exacerbation are also affecting patient's functional outcome.   REHAB POTENTIAL: Fair high anxiety levels, apparent limited buy-in to therapy, self limiting   CLINICAL DECISION MAKING: Evolving/moderate complexity  EVALUATION COMPLEXITY: Moderate  PLAN:  PT FREQUENCY: 1-2x/week  PT DURATION: 6 weeks  PLANNED INTERVENTIONS: Therapeutic exercises, Therapeutic activity, Neuromuscular re-education, Balance training, Gait training, Patient/Family education, Self Care, Joint mobilization, Stair training, Vestibular training, DME instructions, Aquatic Therapy, Electrical stimulation, Manual therapy, and Re-evaluation  PLAN FOR NEXT SESSION: habituation, Balance and strengthening focus.    Anette Guarneri, PT, DPT 12/28/22 3:30 PM  [facility] at [facility] [address] [place], [address] [place], [place] [address] Phone # [phone] Fax # [phone]

## 2022-12-28 ENCOUNTER — Encounter: Payer: Self-pay | Admitting: Physical Therapy

## 2022-12-28 ENCOUNTER — Ambulatory Visit: Payer: Medicare Other | Admitting: Physical Therapy

## 2022-12-28 DIAGNOSIS — M6281 Muscle weakness (generalized): Secondary | ICD-10-CM | POA: Diagnosis not present

## 2022-12-28 DIAGNOSIS — R2681 Unsteadiness on feet: Secondary | ICD-10-CM | POA: Diagnosis not present

## 2022-12-28 DIAGNOSIS — I272 Pulmonary hypertension, unspecified: Secondary | ICD-10-CM | POA: Diagnosis not present

## 2022-12-28 DIAGNOSIS — H402233 Chronic angle-closure glaucoma, bilateral, severe stage: Secondary | ICD-10-CM | POA: Diagnosis not present

## 2022-12-28 DIAGNOSIS — I480 Paroxysmal atrial fibrillation: Secondary | ICD-10-CM | POA: Diagnosis not present

## 2022-12-28 DIAGNOSIS — R269 Unspecified abnormalities of gait and mobility: Secondary | ICD-10-CM | POA: Diagnosis not present

## 2023-01-09 NOTE — Therapy (Signed)
OUTPATIENT PHYSICAL THERAPY NEURO TREATMENT   Patient Name: Meredith Moore MRN: [id number] DOB:[birth year], 87 y.o., female Today's Date: 01/10/2023   PCP: Peri Maris FNP  REFERRING PROVIDER: Melvyn Novas, MD  END OF SESSION:  PT End of Session - 01/10/23 1527     Visit Number 5    Number of Visits 13    Date for PT Re-Evaluation 01/18/23    Authorization Type [employer] MCR    Authorization Time Period 12/07/22 to 01/18/23    Progress Note Due on Visit 10    PT Start Time 1448    PT Stop Time 1526    PT Time Calculation (min) 38 min    Equipment Utilized During Treatment Gait belt    Activity Tolerance Patient tolerated treatment well    Behavior During Therapy WFL for tasks assessed/performed                 Past Medical History:  Diagnosis Date   Arthritis    AV block, Mobitz 1    Blind 10/12/2016   Has learned Braille   C. difficile colitis    Complication of anesthesia    "Blood pressure has gone too low before and effected vision" just with general   Complication of anesthesia    "woke up in severe panic attack" just with general   Glaucoma    Herpes zoster with unspecified complication    Hip dysplasia    right side   History of sinus tachycardia    History of transfusion 2014   2 units, no reaction-after knee replacement   Hyperlipidemia    under control   Iron deficiency anemia    Macular degeneration    Migraine    hx of    Osteoarthrosis, unspecified whether generalized or localized, unspecified site    PAT (paroxysmal atrial tachycardia)    Premature atrial contractions    PVC's (premature ventricular contractions)    Right hip pain    hx    Past Surgical History:  Procedure Laterality Date   APPENDECTOMY  1979   BELPHAROPTOSIS REPAIR     eye lid drooping repair   COLONOSCOPY     ovarian tumor   1979   benign tumor removed    REVERSE SHOULDER ARTHROPLASTY Left 12/21/2017   Procedure: LEFT REVERSE SHOULDER ARTHROPLASTY;   Surgeon: Francena Hanly, MD;  Location: [facility];  Service: Orthopedics;  Laterality: Left;   REVERSE SHOULDER ARTHROPLASTY Right 06/04/2020   Procedure: REVERSE SHOULDER ARTHROPLASTY;  Surgeon: Francena Hanly, MD;  Location: [facility];  Service: Orthopedics;  Laterality: Right;    ROBOTIC ASSISTED TOTAL HYSTERECTOMY WITH BILATERAL SALPINGO OOPHERECTOMY Bilateral 03/25/2014   Procedure: ROBOTIC ASSISTED TOTAL HYSTERECTOMY WITH BILATERAL SALPINGO OOPHORECTOMY;  Surgeon: Adolphus Birchwood, MD;  Location: [facility];  Service: Gynecology;  Laterality: Bilateral;   TONSILLECTOMY     1945    TOTAL HIP ARTHROPLASTY Right 06/04/2014   Procedure: RIGHT TOTAL HIP ARTHROPLASTY ANTERIOR APPROACH;  Surgeon: Loanne Drilling, MD;  Location: [facility];  Service: Orthopedics;  Laterality: Right;   TOTAL HIP ARTHROPLASTY Left 05/10/2017   Procedure: LEFT TOTAL HIP ARTHROPLASTY ANTERIOR APPROACH;  Surgeon: Ollen Gross, MD;  Location: [facility];  Service: Orthopedics;  Laterality: Left;  90 mins   TOTAL KNEE ARTHROPLASTY Right 09/10/2012   Procedure: RIGHT TOTAL KNEE ARTHROPLASTY;  Surgeon: Loanne Drilling, MD;  Location: [facility];  Service: Orthopedics;  Laterality: Right;   TOTAL KNEE ARTHROPLASTY Left 02/25/2019   Procedure: TOTAL KNEE ARTHROPLASTY;  Surgeon: Ollen Gross, MD;  Location: [facility];  Service: Orthopedics;  Laterality: Left;    Patient Active Problem List   Diagnosis Date Noted   Grief reaction 11/22/2022   Chronic insomnia 11/22/2022   Paroxysmal atrial fibrillation (HCC) 11/22/2022   Other secondary pulmonary hypertension (HCC) 05/12/2021   Advanced nonexudative age-related macular degeneration of both eyes with subfoveal involvement 08/06/2020   Degenerative retinal drusen of both eyes 08/06/2020   Optic nerve atrophy 08/06/2020   Primary open angle glaucoma of both eyes, severe stage 08/06/2020   Osteoarthritis of left knee 02/25/2019   Paroxysmal atrial tachycardia 07/03/2018   Hypokalemia 07/03/2018    Normocytic anemia 07/03/2018   Mild renal insufficiency 07/03/2018   Elevated troponin 07/03/2018   Viral gastroenteritis 07/03/2018   S/p reverse total shoulder arthroplasty 12/21/2017   Blind 10/12/2016   Medication management 10/12/2016   Insomnia 10/07/2015   Glaucoma 10/07/2015   OA (osteoarthritis) of hip 06/04/2014   Macular degeneration 09/18/2013   Osteopenia 09/18/2013   Hyperlipidemia 03/14/2013   OA (osteoarthritis) of knee 09/10/2012    ONSET DATE: end of last year, September to December 2023  REFERRING DIAG: F51.04 (ICD-10-CM) - Chronic insomnia F43.21 (ICD-10-CM) - Grief reaction Z61.0960 (ICD-10-CM) - Chronic primary angle-closure glaucoma of both eyes, severe stage I48.0 (ICD-10-CM) - Paroxysmal atrial fibrillation (HCC) I27.20 (ICD-10-CM) - Pulmonary hypertension, unspecified (HCC) R26.9 (ICD-10-CM) - Gait abnormality  THERAPY DIAG:  Unsteadiness on feet  Muscle weakness (generalized)  Rationale for Evaluation and Treatment: Rehabilitation  SUBJECTIVE:                                                                                                                                                                                             SUBJECTIVE STATEMENT: Been up and down, with dizzy spells and balance. Feeling like vision is more foggy today. Reports remaining dizziness when standing and drying her feet off after a shower or when bending to clean furniture. Patient is asking questions about who can help her with her "stopped up ear."   Pt accompanied by: family member  PERTINENT HISTORY: OA, AV block Mobitz grade 1, blind, hx Cdiff, hip dysplasia, hx sinus tach, HLD, PVCs, reverse TSA, THA, TKR  PAIN:  Are you having pain? No  PRECAUTIONS: Fall and Other: blind   WEIGHT BEARING RESTRICTIONS: No  FALLS: Has patient fallen in last 6 months? No  LIVING ENVIRONMENT: Lives with: lives alone but children are in [place] and can help PRN  Lives in:  House/apartment Stairs: 4 STE B rails; 13 steps inside home U rail  Has following equipment at home: Single point  cane and lifeline   PLOF: Independent, Independent with basic ADLs, Independent with gait, and Independent with transfers  PATIENT GOALS: improve balance, be able to move better in general   OBJECTIVE:    TODAY'S TREATMENT: 01/10/23 Activity Comments  Sitting anterior habituation 2x each side  C/o mild strange sensation upon sitting up  Standing anterior habituation to cone on 6" stool 2x5 C/o moderate dizziness upon standing up; use of chair for support  Romberg + head turns 30"  C/o mild instability   Romberg + head nods 30" C/o mild instability and L ear clicking   Alt side stepping Cueing to weight shift onto stepping foot; with chair  Alt posterior stepping Cueing to weight shift onto stepping foot ; with chair  backwards walking In II bars with B UE support   tandem walking in II bars with B UE support   Standing on foam, then romberg, then wide stance with head movements  With and without UE support and with CGA      HOME EXERCISE PROGRAM Access Code: [facility] URL: [email or web address] Date: 01/10/2023 Access Code: [facility] URL: [email or web address] Date: 01/10/2023 Prepared by: [facility]patient  Rehab - [facility]  Program Notes all exercises should be done at a counter top for safety. try adjustable ankle weights 1-5 lbs.   Exercises - Standing March with Counter Support  - 1 x daily - 5 x weekly - 2 sets - 10 reps - Standing Hip Abduction with Counter Support  - 1 x daily - 5 x weekly - 2 sets - 10 reps - Standing Hip Extension with Counter Support  - 1 x daily - 5 x weekly - 2 sets - 10 reps - Mini Squat with Counter Support  - 1 x daily - 5 x weekly - 2 sets - 10 reps - Seated Hip Abduction  - 1 x daily - 5 x weekly - 2 sets - 10 reps - Standing with Head Nod  - 1 x daily - 5 x weekly - 2-3 sets - 30 sec  hold - Standing with Head Rotation  - 1 x daily - 5 x weekly - 2-3 sets - 30 sec hold - Seated Nose to Right Knee Vestibular Habituation  - 2 x daily - 7 x weekly - 1-2 sets - 3-5 reps - 5 sec hold - Alternating Step Backward with Support  - 1 x daily - 5 x weekly - 2 sets - 10 reps - Side Stepping with Unilateral Counter Support  - 1 x daily - 5 x weekly - 2 sets - 10 reps   PATIENT EDUCATION: Education details: HEP update- to be performed in standing with UE support on each side for safety Person educated: Patient Education method: Explanation, Demonstration, Tactile cues, and Verbal cues Education comprehension: verbalized understanding and returned demonstration     Below measures were taken at time of initial evaluation unless otherwise specified:   COGNITION: Overall cognitive status: Within functional limits for tasks assessed   SENSATION: WFL          LOWER EXTREMITY MMT:    MMT Right Eval Left Eval  Hip flexion 3+ 3+  Hip extension    Hip abduction 3 3-  Hip adduction    Hip internal rotation    Hip external rotation    Knee flexion 4+ 4+  Knee extension 5 5  Ankle dorsiflexion 5 5  Ankle plantarflexion    Ankle inversion 5 5  Ankle eversion 5 5  (  Blank rows = not tested)  BED MOBILITY:  Sit to supine Complete Independence Supine to sit Complete Independence Rolling to Right Complete Independence Rolling to Left Complete Independence  TRANSFERS: Assistive device utilized: Single point cane  Sit to stand: Modified independence Stand to sit: Modified independence Chair to chair: Modified independence       GAIT: Gait pattern: WFL, step through pattern,  + trend, and narrow BOS Distance walked: 33ft Assistive device utilized: Single point cane Level of assistance: Modified independence Comments: slow, cautious gait but appropriate for person with vision deficits in new environment   FUNCTIONAL TESTS:  3 minute walk test: 373ft SPC   Berg Balance Scale: 35/56  PATIENT SURVEYS:  Not in FOTO system at eval   TODAY'S TREATMENT:                                                                                                                              DATE:   Eval  Objective measures/appropriate education, POC, importance of balance interventions in improving fall risk and mobility      PATIENT EDUCATION: Education details: exam findings, POC, importance of balance interventions in improving fall risk and mobility  Person educated: Patient Education method: Explanation Education comprehension: verbalized understanding, returned demonstration, and needs further education  HOME EXERCISE PROGRAM: Will give second session   GOALS: Goals reviewed with patient? Yes  SHORT TERM GOALS: Target date: 12/28/2022    Will be compliant with appropriate progressive HEP  Baseline: Goal status: MET 12/15/22  2.  Will be able to verbalize at least 3 ways to reduce fall risk in context of poor vision in novel and unfamiliar settings  Baseline: pt able to verbalize Goal status: MET 01/10/23    LONG TERM GOALS: Target date: 01/18/2023    MMT to improve by at least 1 grade in all weak groups  Baseline:  Goal status: IN PROGRESS  2.  Will score at least 45/56 on Berg balance test to show reduced fall risk  Baseline:  Goal status: IN PROGRESS  3.  Will be able to ambulate over unsteady surfaces such as grass or gravel with SPC and no more than min guard with minimal unsteadiness  Baseline:  Goal status: IN PROGRESS    ASSESSMENT:  CLINICAL IMPRESSION: Patient arrived to session with report of fluctuating dizziness. Notes most remaining dizziness with bending over to dry her feet and still notes L ear pressure. Worked on increasing challenge with anterior habituation activities by having patient in standing which did increase symptoms compared to seated version. Reviewed balance activities from HEP which still appeared  challenging but appropriate. Able to perform progression of balance activities with backwards walking, tandem walking, and compliant surface training. Patient reported understanding of all edu provided and without complaints upon leaving.   OBJECTIVE IMPAIRMENTS: Abnormal gait, decreased activity tolerance, decreased balance, decreased coordination, decreased mobility, difficulty walking, decreased strength, and impaired perceived functional ability.   ACTIVITY LIMITATIONS:  stairs, transfers, and locomotion level  PARTICIPATION LIMITATIONS: shopping, community activity, yard work, and church  PERSONAL FACTORS: Age, Behavior pattern, Education, Fitness, Past/current experiences, Profession, Sex, Social background, and Time since onset of injury/illness/exacerbation are also affecting patient's functional outcome.   REHAB POTENTIAL: Fair high anxiety levels, apparent limited buy-in to therapy, self limiting   CLINICAL DECISION MAKING: Evolving/moderate complexity  EVALUATION COMPLEXITY: Moderate  PLAN:  PT FREQUENCY: 1-2x/week  PT DURATION: 6 weeks  PLANNED INTERVENTIONS: Therapeutic exercises, Therapeutic activity, Neuromuscular re-education, Balance training, Gait training, Patient/Family education, Self Care, Joint mobilization, Stair training, Vestibular training, DME instructions, Aquatic Therapy, Electrical stimulation, Manual therapy, and Re-evaluation  PLAN FOR NEXT SESSION: habituation, Balance and strengthening focus.    Anette Guarneri, PT, DPT 01/10/23 3:27 PM  [facility] [address] [place], [address] [place], [place] [address] Phone # [phone] Fax # [phone]

## 2023-01-10 ENCOUNTER — Ambulatory Visit: Payer: Medicare Other | Attending: Neurology | Admitting: Physical Therapy

## 2023-01-10 ENCOUNTER — Encounter: Payer: Self-pay | Admitting: Physical Therapy

## 2023-01-10 DIAGNOSIS — M6281 Muscle weakness (generalized): Secondary | ICD-10-CM | POA: Diagnosis not present

## 2023-01-10 DIAGNOSIS — R2681 Unsteadiness on feet: Secondary | ICD-10-CM | POA: Diagnosis not present

## 2023-01-16 NOTE — Therapy (Signed)
OUTPATIENT PHYSICAL THERAPY NEURO TREATMENT   Patient Name: Meredith Moore MRN: [id number] DOB:1935-11-07, 87 y.o., female Today's Date: 01/16/2023   PCP: Peri Maris FNP  REFERRING PROVIDER: Melvyn Novas, MD  END OF SESSION:        Past Medical History:  Diagnosis Date   Arthritis    AV block, Mobitz 1    Blind 10/12/2016   Has learned Braille   C. difficile colitis    Complication of anesthesia    "Blood pressure has gone too low before and effected vision" just with general   Complication of anesthesia    "woke up in severe panic attack" just with general   Glaucoma    Herpes zoster with unspecified complication    Hip dysplasia    right side   History of sinus tachycardia    History of transfusion 2014   2 units, no reaction-after knee replacement   Hyperlipidemia    under control   Iron deficiency anemia    Macular degeneration    Migraine    hx of    Osteoarthrosis, unspecified whether generalized or localized, unspecified site    PAT (paroxysmal atrial tachycardia)    Premature atrial contractions    PVC's (premature ventricular contractions)    Right hip pain    hx    Past Surgical History:  Procedure Laterality Date   APPENDECTOMY  1979   BELPHAROPTOSIS REPAIR     eye lid drooping repair   COLONOSCOPY     ovarian tumor   1979   benign tumor removed    REVERSE SHOULDER ARTHROPLASTY Left 12/21/2017   Procedure: LEFT REVERSE SHOULDER ARTHROPLASTY;  Surgeon: Francena Hanly, MD;  Location: [facility];  Service: Orthopedics;  Laterality: Left;   REVERSE SHOULDER ARTHROPLASTY Right 06/04/2020   Procedure: REVERSE SHOULDER ARTHROPLASTY;  Surgeon: Francena Hanly, MD;  Location: [facility];  Service: Orthopedics;  Laterality: Right;    ROBOTIC ASSISTED TOTAL HYSTERECTOMY WITH BILATERAL SALPINGO OOPHERECTOMY Bilateral 03/25/2014   Procedure: ROBOTIC ASSISTED TOTAL HYSTERECTOMY WITH BILATERAL SALPINGO OOPHORECTOMY;  Surgeon: Adolphus Birchwood, MD;  Location:  [facility];  Service: Gynecology;  Laterality: Bilateral;   TONSILLECTOMY     1945    TOTAL HIP ARTHROPLASTY Right 06/04/2014   Procedure: RIGHT TOTAL HIP ARTHROPLASTY ANTERIOR APPROACH;  Surgeon: Loanne Drilling, MD;  Location: [facility];  Service: Orthopedics;  Laterality: Right;   TOTAL HIP ARTHROPLASTY Left 05/10/2017   Procedure: LEFT TOTAL HIP ARTHROPLASTY ANTERIOR APPROACH;  Surgeon: Ollen Gross, MD;  Location: [facility];  Service: Orthopedics;  Laterality: Left;  90 mins   TOTAL KNEE ARTHROPLASTY Right 09/10/2012   Procedure: RIGHT TOTAL KNEE ARTHROPLASTY;  Surgeon: Loanne Drilling, MD;  Location: [facility];  Service: Orthopedics;  Laterality: Right;   TOTAL KNEE ARTHROPLASTY Left 02/25/2019   Procedure: TOTAL KNEE ARTHROPLASTY;  Surgeon: Ollen Gross, MD;  Location: [facility];  Service: Orthopedics;  Laterality: Left;    Patient Active Problem List   Diagnosis Date Noted   Grief reaction 11/22/2022   Chronic insomnia 11/22/2022   Paroxysmal atrial fibrillation (HCC) 11/22/2022   Other secondary pulmonary hypertension (HCC) 05/12/2021   Advanced nonexudative age-related macular degeneration of both eyes with subfoveal involvement 08/06/2020   Degenerative retinal drusen of both eyes 08/06/2020   Optic nerve atrophy 08/06/2020   Primary open angle glaucoma of both eyes, severe stage 08/06/2020   Osteoarthritis of left knee 02/25/2019   Paroxysmal atrial tachycardia 07/03/2018   Hypokalemia 07/03/2018   Normocytic anemia  07/03/2018   Mild renal insufficiency 07/03/2018   Elevated troponin 07/03/2018   Viral gastroenteritis 07/03/2018   S/p reverse total shoulder arthroplasty 12/21/2017   Blind 10/12/2016   Medication management 10/12/2016   Insomnia 10/07/2015   Glaucoma 10/07/2015   OA (osteoarthritis) of hip 06/04/2014   Macular degeneration 09/18/2013   Osteopenia 09/18/2013   Hyperlipidemia 03/14/2013   OA (osteoarthritis) of knee 09/10/2012    ONSET DATE: end of last year,  September to December 2023  REFERRING DIAG: F51.04 (ICD-10-CM) - Chronic insomnia F43.21 (ICD-10-CM) - Grief reaction Z61.0960 (ICD-10-CM) - Chronic primary angle-closure glaucoma of both eyes, severe stage I48.0 (ICD-10-CM) - Paroxysmal atrial fibrillation (HCC) I27.20 (ICD-10-CM) - Pulmonary hypertension, unspecified (HCC) R26.9 (ICD-10-CM) - Gait abnormality  THERAPY DIAG:  No diagnosis found.  Rationale for Evaluation and Treatment: Rehabilitation  SUBJECTIVE:                                                                                                                                                                                             SUBJECTIVE STATEMENT: Been up and down, with dizzy spells and balance. Feeling like vision is more foggy today. Reports remaining dizziness when standing and drying her feet off after a shower or when bending to clean furniture. Patient is asking questions about who can help her with her "stopped up ear."   Pt accompanied by: family member  PERTINENT HISTORY: OA, AV block Mobitz grade 1, blind, hx Cdiff, hip dysplasia, hx sinus tach, HLD, PVCs, reverse TSA, THA, TKR  PAIN:  Are you having pain? No  PRECAUTIONS: Fall and Other: blind   WEIGHT BEARING RESTRICTIONS: No  FALLS: Has patient fallen in last 6 months? No  LIVING ENVIRONMENT: Lives with: lives alone but children are in [place] and can help PRN  Lives in: House/apartment Stairs: 4 STE B rails; 13 steps inside home U rail  Has following equipment at home: Single point cane and lifeline   PLOF: Independent, Independent with basic ADLs, Independent with gait, and Independent with transfers  PATIENT GOALS: improve balance, be able to move better in general   OBJECTIVE:    TODAY'S TREATMENT: 01/17/23 Activity Comments                        HOME EXERCISE PROGRAM Access Code: [facility] URL: [email or web address] Date: 01/10/2023 Access Code: [facility] URL:  [email or web address] Date: 01/10/2023 Prepared by: [facility] - Outpatient  Rehab - [facility]  Program Notes all exercises should be done at a counter top for safety. try adjustable ankle weights 1-5 lbs.   Exercises - Standing March  with Counter Support  - 1 x daily - 5 x weekly - 2 sets - 10 reps - Standing Hip Abduction with Counter Support  - 1 x daily - 5 x weekly - 2 sets - 10 reps - Standing Hip Extension with Counter Support  - 1 x daily - 5 x weekly - 2 sets - 10 reps - Mini Squat with Counter Support  - 1 x daily - 5 x weekly - 2 sets - 10 reps - Seated Hip Abduction  - 1 x daily - 5 x weekly - 2 sets - 10 reps - Standing with Head Nod  - 1 x daily - 5 x weekly - 2-3 sets - 30 sec hold - Standing with Head Rotation  - 1 x daily - 5 x weekly - 2-3 sets - 30 sec hold - Seated Nose to Right Knee Vestibular Habituation  - 2 x daily - 7 x weekly - 1-2 sets - 3-5 reps - 5 sec hold - Alternating Step Backward with Support  - 1 x daily - 5 x weekly - 2 sets - 10 reps - Side Stepping with Unilateral Counter Support  - 1 x daily - 5 x weekly - 2 sets - 10 reps    Below measures were taken at time of initial evaluation unless otherwise specified:   COGNITION: Overall cognitive status: Within functional limits for tasks assessed   SENSATION: WFL          LOWER EXTREMITY MMT:    MMT Right Eval Left Eval  Hip flexion 3+ 3+  Hip extension    Hip abduction 3 3-  Hip adduction    Hip internal rotation    Hip external rotation    Knee flexion 4+ 4+  Knee extension 5 5  Ankle dorsiflexion 5 5  Ankle plantarflexion    Ankle inversion 5 5  Ankle eversion 5 5  (Blank rows = not tested)  BED MOBILITY:  Sit to supine Complete Independence Supine to sit Complete Independence Rolling to Right Complete Independence Rolling to Left Complete Independence  TRANSFERS: Assistive device utilized: Single point cane  Sit to stand: Modified  independence Stand to sit: Modified independence Chair to chair: Modified independence       GAIT: Gait pattern: WFL, step through pattern,  + trend, and narrow BOS Distance walked: 375ft Assistive device utilized: Single point cane Level of assistance: Modified independence Comments: slow, cautious gait but appropriate for person with vision deficits in new environment   FUNCTIONAL TESTS:  3 minute walk test: 359ft SPC  Berg Balance Scale: 35/56  PATIENT SURVEYS:  Not in FOTO system at eval   TODAY'S TREATMENT:                                                                                                                              DATE:   Eval  Objective measures/appropriate education, POC, importance of balance interventions in improving fall  risk and mobility      PATIENT EDUCATION: Education details: exam findings, POC, importance of balance interventions in improving fall risk and mobility  Person educated: Patient Education method: Explanation Education comprehension: verbalized understanding, returned demonstration, and needs further education  HOME EXERCISE PROGRAM: Will give second session   GOALS: Goals reviewed with patient? Yes  SHORT TERM GOALS: Target date: 12/28/2022    Will be compliant with appropriate progressive HEP  Baseline: Goal status: MET 12/15/22  2.  Will be able to verbalize at least 3 ways to reduce fall risk in context of poor vision in novel and unfamiliar settings  Baseline: pt able to verbalize Goal status: MET 01/10/23    LONG TERM GOALS: Target date: 01/18/2023    MMT to improve by at least 1 grade in all weak groups  Baseline:  Goal status: IN PROGRESS  2.  Will score at least 45/56 on Berg balance test to show reduced fall risk  Baseline:  Goal status: IN PROGRESS  3.  Will be able to ambulate over unsteady surfaces such as grass or gravel with SPC and no more than min guard with minimal unsteadiness  Baseline:   Goal status: IN PROGRESS    ASSESSMENT:  CLINICAL IMPRESSION: Patient arrived to session with report of fluctuating dizziness. Notes most remaining dizziness with bending over to dry her feet and still notes L ear pressure. Worked on increasing challenge with anterior habituation activities by having patient in standing which did increase symptoms compared to seated version. Reviewed balance activities from HEP which still appeared challenging but appropriate. Able to perform progression of balance activities with backwards walking, tandem walking, and compliant surface training. Patient reported understanding of all edu provided and without complaints upon leaving.   OBJECTIVE IMPAIRMENTS: Abnormal gait, decreased activity tolerance, decreased balance, decreased coordination, decreased mobility, difficulty walking, decreased strength, and impaired perceived functional ability.   ACTIVITY LIMITATIONS: stairs, transfers, and locomotion level  PARTICIPATION LIMITATIONS: shopping, community activity, yard work, and church  PERSONAL FACTORS: Age, Behavior pattern, Education, Fitness, Past/current experiences, Profession, Sex, Social background, and Time since onset of injury/illness/exacerbation are also affecting patient's functional outcome.   REHAB POTENTIAL: Fair high anxiety levels, apparent limited buy-in to therapy, self limiting   CLINICAL DECISION MAKING: Evolving/moderate complexity  EVALUATION COMPLEXITY: Moderate  PLAN:  PT FREQUENCY: 1-2x/week  PT DURATION: 6 weeks  PLANNED INTERVENTIONS: Therapeutic exercises, Therapeutic activity, Neuromuscular re-education, Balance training, Gait training, Patient/Family education, Self Care, Joint mobilization, Stair training, Vestibular training, DME instructions, Aquatic Therapy, Electrical stimulation, Manual therapy, and Re-evaluation  PLAN FOR NEXT SESSION: habituation, Balance and strengthening focus.    Anette Guarneri, PT,  DPT 01/16/23 9:25 AM  [facility] [address] [place], [address] [place], [place] [address] Phone # [phone] Fax # [phone]

## 2023-01-17 ENCOUNTER — Encounter: Payer: Self-pay | Admitting: Physical Therapy

## 2023-01-17 ENCOUNTER — Ambulatory Visit: Payer: Medicare Other | Admitting: Physical Therapy

## 2023-01-17 DIAGNOSIS — R2681 Unsteadiness on feet: Secondary | ICD-10-CM | POA: Diagnosis not present

## 2023-01-17 DIAGNOSIS — M6281 Muscle weakness (generalized): Secondary | ICD-10-CM | POA: Diagnosis not present

## 2023-03-08 DIAGNOSIS — H353134 Nonexudative age-related macular degeneration, bilateral, advanced atrophic with subfoveal involvement: Secondary | ICD-10-CM | POA: Diagnosis not present

## 2023-03-08 DIAGNOSIS — H5319 Other subjective visual disturbances: Secondary | ICD-10-CM | POA: Diagnosis not present

## 2023-03-08 DIAGNOSIS — H401133 Primary open-angle glaucoma, bilateral, severe stage: Secondary | ICD-10-CM | POA: Diagnosis not present

## 2023-03-16 DIAGNOSIS — R7303 Prediabetes: Secondary | ICD-10-CM | POA: Diagnosis not present

## 2023-03-16 DIAGNOSIS — N1831 Chronic kidney disease, stage 3a: Secondary | ICD-10-CM | POA: Diagnosis not present

## 2023-03-16 DIAGNOSIS — I5032 Chronic diastolic (congestive) heart failure: Secondary | ICD-10-CM | POA: Diagnosis not present

## 2023-03-16 DIAGNOSIS — I480 Paroxysmal atrial fibrillation: Secondary | ICD-10-CM | POA: Diagnosis not present

## 2023-03-16 DIAGNOSIS — E782 Mixed hyperlipidemia: Secondary | ICD-10-CM | POA: Diagnosis not present

## 2023-03-20 DIAGNOSIS — I480 Paroxysmal atrial fibrillation: Secondary | ICD-10-CM | POA: Diagnosis not present

## 2023-03-20 DIAGNOSIS — I5032 Chronic diastolic (congestive) heart failure: Secondary | ICD-10-CM | POA: Diagnosis not present

## 2023-03-20 DIAGNOSIS — M810 Age-related osteoporosis without current pathological fracture: Secondary | ICD-10-CM | POA: Diagnosis not present

## 2023-03-20 DIAGNOSIS — I272 Pulmonary hypertension, unspecified: Secondary | ICD-10-CM | POA: Diagnosis not present

## 2023-03-20 DIAGNOSIS — D649 Anemia, unspecified: Secondary | ICD-10-CM | POA: Diagnosis not present

## 2023-03-20 DIAGNOSIS — N1831 Chronic kidney disease, stage 3a: Secondary | ICD-10-CM | POA: Diagnosis not present

## 2023-03-20 DIAGNOSIS — Z0000 Encounter for general adult medical examination without abnormal findings: Secondary | ICD-10-CM | POA: Diagnosis not present

## 2023-05-16 DIAGNOSIS — Z1231 Encounter for screening mammogram for malignant neoplasm of breast: Secondary | ICD-10-CM | POA: Diagnosis not present

## 2023-05-24 ENCOUNTER — Ambulatory Visit: Payer: Medicare Other | Admitting: Adult Health

## 2023-06-08 ENCOUNTER — Ambulatory Visit: Payer: Medicare Other | Admitting: Physician Assistant

## 2023-07-13 NOTE — Progress Notes (Signed)
Cardiology Office Note    Date:  07/14/2023  ID:  Meredith Moore, DOB 1935-11-17, MRN [id number] PCP:  Soundra Pilon, FNP  Cardiologist:  Donato Schultz, MD (after acute f/u patient would like to f/u [facility]) Electrophysiologist:  None   Chief Complaint: palpitations  History of Present Illness: .    Meredith Moore is a 87 y.o. female with visit-pertinent history of paroxysmal atrial tachycardia, PACs/PVCs, Mobitz 1 AVB, mod pulm HTN, blindness, glaucoma, macular degeneration, HLD, IDA, migraine who is seen for follow-up.   She was remotely hospitalized in 2019 with viral gastroenteritis at which time cardiology saw for PAT (reported as NOT afib in notes), and elevated troponin (0.61) felt due to demand ischemia from tachycardia and illness. 2D echo 07/2018 EF 55-60%, g2DD, mild LAE, mod RAE, moderate pulm HTN. Monitor 12/2018 showed NSR with brief runs of PAT, 5.1% PACs, 1.1% PVCs, one episode of 2nd degree type 1 AVB. She had some brief recurrent palpitations in the setting of the passing of her husband last year. He liked to feed squirrels.  She is seen back today with her daughter. Over the last year she's had very sporadic episodes of palpitations sometimes lasting 3-7 hours at a time. No CP, SOB, syncope. She has also had issues with balance since her husband passed away and now has to use a cane. She's seen ENT and neurology without significant answers per their report. PT did help a little.  Labwork independently reviewed: 03/2023 KPN LDL 89, trig 106, A1c 5.8, Hgb 11, K 4.1, ALT 9 06/2022 CBC OK Hgb 11.5, TSH OK, Mg OK, K 5.2, Cr 0.92  ROS: .    Please see the history of present illness.  All other systems are reviewed and otherwise negative.  Studies Reviewed: [name]    EKG:  EKG is ordered today, personally reviewed, demonstrating SB 65bpm with sinus arrhythmia, no acute STT changes  CV Studies: Cardiac studies reviewed are outlined and summarized above. Otherwise please see  EMR for full report.   Current Reported Medications:.    Current Meds  Medication Sig   acetaminophen (TYLENOL) 650 MG CR tablet Take 1,300 mg by mouth every 8 (eight) hours as needed for pain.   atorvastatin (LIPITOR) 10 MG tablet Take 5 mg by mouth at bedtime.    Calcium Carb-Cholecalciferol (CALCIUM+D3 PO) Take 1 tablet by mouth daily.   Cholecalciferol (VITAMIN D3) 50 MCG (2000 UT) TABS Take 2,000 Units by mouth daily.    ferrous sulfate 324 MG TBEC Take 324 mg by mouth daily with breakfast.   fluticasone (FLONASE) 50 MCG/ACT nasal spray Place 2 sprays into both nostrils daily.   guaiFENesin (MUCINEX) 600 MG 12 hr tablet Take 1 tablet (600 mg total) by mouth 2 (two) times daily.   latanoprost (XALATAN) 0.005 % ophthalmic solution Place 1 drop into both eyes at bedtime.    Loratadine (CLARITIN PO) Take 1 tablet by mouth daily.   LUTEIN PO Take 1 tablet by mouth daily.   meclizine (ANTIVERT) 12.5 MG tablet Take one tablet by mouth up to four times daily as needed for vertigo   metoprolol tartrate (LOPRESSOR) 25 MG tablet Take 0.5 tablets (12.5 mg total) by mouth 2 (two) times daily.   Multiple Vitamins-Minerals (PRESERVISION AREDS 2+MULTI VIT) CAPS Take 1 tablet by mouth 2 (two) times daily.   QUEtiapine (SEROQUEL) 25 MG tablet Take 12.5 mg by mouth at bedtime.   SIMBRINZA 1-0.2 % SUSP Place 1 drop into both eyes  3 (three) times daily with meals.    timolol (TIMOPTIC) 0.5 % ophthalmic solution Place 1 drop into both eyes 2 (two) times daily.     Physical Exam:    VS:  BP 126/74   Pulse 77   Ht 5\' 4"  (1.626 m)   Wt 128 lb 3.2 oz (58.2 kg)   SpO2 99%   BMI 22.01 kg/m    Wt Readings from Last 3 Encounters:  07/14/23 128 lb 3.2 oz (58.2 kg)  11/22/22 123 lb (55.8 kg)  06/08/22 127 lb 12.8 oz (58 kg)    GEN: Well nourished, well developed in no acute distress NECK: No JVD; No carotid bruits CARDIAC: RRR, no murmurs, rubs, gallops RESPIRATORY:  Clear to auscultation without  rales, wheezing or rhonchi  ABDOMEN: Soft, non-tender, non-distended EXTREMITIES:  No edema; No acute deformity   Asessement and Plan:.    1. Palpitations, dizziness - per shared decision making, update 30 day monitor (chosen given infrequency of events) and 2d echocardiogram. Did not empirically titrate BB given h/o Mobitz 1 as well as the dizziness, in case she is also having bradycardia. Consideration could be given to titration of metoprolol versus switching to lowest dose diltiazem if appropriate based on monitor results. Check basic labs as well given increase in palps.  2. H/o PAT, PACs, PVCs - will evaluate burden above.  3. Mobitz 1 AVB - will evaluate burden above.  4. Moderate pHTN - generally asymptomatic but will see what echo shows since doing this for palpitations/dizziness.    Disposition: F/u with me in 8-10 weeks. Longterm prefers [facility] location due to convenience but short term wants to see me.  Signed, Laurann Montana, PA-C

## 2023-07-14 ENCOUNTER — Ambulatory Visit: Payer: Medicare Other | Attending: Physician Assistant | Admitting: Physician Assistant

## 2023-07-14 ENCOUNTER — Encounter: Payer: Self-pay | Admitting: Physician Assistant

## 2023-07-14 VITALS — BP 126/74 | HR 77 | Ht 64.0 in | Wt 128.2 lb

## 2023-07-14 DIAGNOSIS — I493 Ventricular premature depolarization: Secondary | ICD-10-CM

## 2023-07-14 DIAGNOSIS — I4719 Other supraventricular tachycardia: Secondary | ICD-10-CM

## 2023-07-14 DIAGNOSIS — R002 Palpitations: Secondary | ICD-10-CM

## 2023-07-14 DIAGNOSIS — I491 Atrial premature depolarization: Secondary | ICD-10-CM

## 2023-07-14 DIAGNOSIS — I441 Atrioventricular block, second degree: Secondary | ICD-10-CM

## 2023-07-14 DIAGNOSIS — I272 Pulmonary hypertension, unspecified: Secondary | ICD-10-CM

## 2023-07-14 NOTE — Patient Instructions (Addendum)
Medication Instructions:  Your physician recommends that you continue on your current medications as directed. Please refer to the Current Medication list given to you today.  *If you need a refill on your cardiac medications before your next appointment, please call your pharmacy*   Lab Work: TODAY: BMET, MG, CBC, TSH If you have labs (blood work) drawn today and your tests are completely normal, you will receive your results only by: MyChart Message (if you have MyChart) OR A paper copy in the mail If you have any lab test that is abnormal or we need to change your treatment, we will call you to review the results.   Testing/Procedures: Your physician has requested that you have an echocardiogram. Echocardiography is a painless test that uses sound waves to create images of your heart. It provides your doctor with information about the size and shape of your heart and how well your heart's chambers and valves are working. This procedure takes approximately one hour. There are no restrictions for this procedure. Please do NOT wear cologne, perfume, aftershave, or lotions (deodorant is allowed). Please arrive 15 minutes prior to your appointment time.  Please note: We ask at that you not bring children with you during ultrasound (echo/ vascular) testing. Due to room size and safety concerns, children are not allowed in the ultrasound rooms during exams. Our front office staff cannot provide observation of children in our lobby area while testing is being conducted. An adult accompanying a patient to their appointment will only be allowed in the ultrasound room at the discretion of the ultrasound technician under special circumstances. We apologize for any inconvenience.   Your physician has recommended that you wear an 30 DAY event monitor. Event monitors are medical devices that record the heart's electrical activity. Doctors most often [place] these monitors to diagnose arrhythmias. Arrhythmias are  problems with the speed or rhythm of the heartbeat. The monitor is a small, portable device. You can wear one while you do your normal daily activities. This is usually used to diagnose what is causing palpitations/syncope (passing out).   Follow-Up: At [facility], you and your health needs are our priority.  As part of our continuing mission to provide you with exceptional heart care, we have created designated Provider Care Teams.  These Care Teams include your primary Cardiologist (physician) and Advanced Practice Providers (APPs -  Physician Assistants and Nurse Practitioners) who all work together to provide you with the care you need, when you need it.  We recommend signing up for the patient portal called "MyChart".  Sign up information is provided on this After Visit Summary.  MyChart is used to connect with patients for Virtual Visits (Telemedicine).  Patients are able to view lab/test results, encounter notes, upcoming appointments, etc.  Non-urgent messages can be sent to your provider as well.   To learn more about what you can do with MyChart, go to [email or web address].    Your next appointment:   8-10 week(s)  Provider:   Ronie Spies, PA-C

## 2023-07-15 LAB — BASIC METABOLIC PANEL
BUN/Creatinine Ratio: 19 (ref 12–28)
BUN: 19 mg/dL (ref 8–27)
CO2: 22 mmol/L (ref 20–29)
Calcium: 9.7 mg/dL (ref 8.7–10.3)
Chloride: 104 mmol/L (ref 96–106)
Creatinine, Ser: 0.98 mg/dL (ref 0.57–1.00)
Glucose: 98 mg/dL (ref 70–99)
Potassium: 5.1 mmol/L (ref 3.5–5.2)
Sodium: 138 mmol/L (ref 134–144)
eGFR: 57 mL/min/{1.73_m2} — ABNORMAL LOW (ref >59)

## 2023-07-15 LAB — CBC
Hematocrit: 36.6 % (ref 34.0–46.6)
Hemoglobin: 11.7 g/dL (ref 11.1–15.9)
MCH: 29.8 pg (ref 26.6–33.0)
MCHC: 32 g/dL (ref 31.5–35.7)
MCV: 93 fL (ref 79–97)
Platelets: 265 10*3/uL (ref 150–450)
RBC: 3.92 x10E6/uL (ref 3.77–5.28)
RDW: 12.6 % (ref 11.7–15.4)
WBC: 7 10*3/uL (ref 3.4–10.8)

## 2023-07-15 LAB — TSH: TSH: 3.64 u[IU]/mL (ref 0.450–4.500)

## 2023-07-15 LAB — MAGNESIUM: Magnesium: 2.2 mg/dL (ref 1.6–2.3)

## 2023-08-05 ENCOUNTER — Other Ambulatory Visit: Payer: Self-pay | Admitting: Cardiology

## 2023-08-07 ENCOUNTER — Telehealth: Payer: Self-pay | Admitting: Cardiology

## 2023-08-07 ENCOUNTER — Other Ambulatory Visit: Payer: Self-pay

## 2023-08-07 MED ORDER — METOPROLOL TARTRATE 25 MG PO TABS: 12.5000 mg | ORAL_TABLET | Freq: Two times a day (BID) | ORAL | 3 refills | Status: DC

## 2023-08-07 NOTE — Telephone Encounter (Signed)
*  STAT* If patient is at the pharmacy, call can be transferred to refill team.   1. Which medications need to be refilled? (please list name of each medication and dose if known)   metoprolol  tartrate (LOPRESSOR ) 25 MG tablet     2. Would you like to learn more about the convenience, safety, & potential cost savings by using the [facility]? No   3. Are you open to using the [facility] ([facility]. ) No   4. Which pharmacy/location (including street and city if local pharmacy) is medication to be sent to? [facility] [address] [place] - [address]     5. Do they need a 30 day or 90 day supply? 90 day

## 2023-08-21 ENCOUNTER — Ambulatory Visit: Payer: Medicare Other | Attending: Cardiology

## 2023-08-21 ENCOUNTER — Ambulatory Visit (INDEPENDENT_AMBULATORY_CARE_PROVIDER_SITE_OTHER): Payer: Medicare Other

## 2023-08-21 ENCOUNTER — Other Ambulatory Visit: Payer: Self-pay | Admitting: Physician Assistant

## 2023-08-21 DIAGNOSIS — I441 Atrioventricular block, second degree: Secondary | ICD-10-CM | POA: Diagnosis not present

## 2023-08-21 DIAGNOSIS — R42 Dizziness and giddiness: Secondary | ICD-10-CM | POA: Diagnosis not present

## 2023-08-21 DIAGNOSIS — I4719 Other supraventricular tachycardia: Secondary | ICD-10-CM | POA: Insufficient documentation

## 2023-08-21 DIAGNOSIS — I491 Atrial premature depolarization: Secondary | ICD-10-CM

## 2023-08-21 DIAGNOSIS — I493 Ventricular premature depolarization: Secondary | ICD-10-CM

## 2023-08-21 DIAGNOSIS — R002 Palpitations: Secondary | ICD-10-CM

## 2023-08-21 DIAGNOSIS — I5030 Unspecified diastolic (congestive) heart failure: Secondary | ICD-10-CM | POA: Diagnosis not present

## 2023-08-21 DIAGNOSIS — I272 Pulmonary hypertension, unspecified: Secondary | ICD-10-CM

## 2023-08-21 LAB — ECHOCARDIOGRAM COMPLETE
Area-P 1/2: 2.42 cm2
P 1/2 time: 433 ms
S' Lateral: 1.9 cm

## 2023-08-21 NOTE — Progress Notes (Unsigned)
ZIO XT serial # [id number] from office inventory applied to patient.

## 2023-09-08 ENCOUNTER — Ambulatory Visit: Payer: Medicare Other | Admitting: Physician Assistant

## 2023-09-08 DIAGNOSIS — I493 Ventricular premature depolarization: Secondary | ICD-10-CM | POA: Diagnosis not present

## 2023-09-08 DIAGNOSIS — R002 Palpitations: Secondary | ICD-10-CM | POA: Diagnosis not present

## 2023-09-12 ENCOUNTER — Telehealth: Payer: Self-pay

## 2023-09-12 DIAGNOSIS — I480 Paroxysmal atrial fibrillation: Secondary | ICD-10-CM

## 2023-09-12 NOTE — Telephone Encounter (Signed)
-----   Message from Laurann Montana sent at 09/10/2023  2:04 PM EST ----- I unfortunately had to cancel clinic due to illness the day that she was supposed to come in, got rescheduled to 09/2023. Per Dr. Anne Fu' review of monitor, recommend setting up OV to return to discuss anticoagulation given brief atrial fibrillation seen on monitor. Because I am covering [place] so much this month, I am not back in [place] again until mid March. Can you see if the patient can come in for f/u updated baseline BMET/CBC on Monday and I will call her on Tuesday to discuss recs for anticoagulation?

## 2023-09-12 NOTE — Telephone Encounter (Signed)
Spoke with patient, will have labs repeated at some point this week (doesn't drive and kids provide transportation).

## 2023-09-13 DIAGNOSIS — I480 Paroxysmal atrial fibrillation: Secondary | ICD-10-CM | POA: Diagnosis not present

## 2023-09-14 ENCOUNTER — Telehealth: Payer: Self-pay | Admitting: Physician Assistant

## 2023-09-14 LAB — CBC
Hematocrit: 36.1 % (ref 34.0–46.6)
Hemoglobin: 11.5 g/dL (ref 11.1–15.9)
MCH: 30 pg (ref 26.6–33.0)
MCHC: 31.9 g/dL (ref 31.5–35.7)
MCV: 94 fL (ref 79–97)
Platelets: 239 10*3/uL (ref 150–450)
RBC: 3.83 x10E6/uL (ref 3.77–5.28)
RDW: 12.9 % (ref 11.7–15.4)
WBC: 6.8 10*3/uL (ref 3.4–10.8)

## 2023-09-14 LAB — BASIC METABOLIC PANEL
BUN/Creatinine Ratio: 19 (ref 12–28)
BUN: 19 mg/dL (ref 8–27)
CO2: 22 mmol/L (ref 20–29)
Calcium: 9.4 mg/dL (ref 8.7–10.3)
Chloride: 102 mmol/L (ref 96–106)
Creatinine, Ser: 1 mg/dL (ref 0.57–1.00)
Glucose: 90 mg/dL (ref 70–99)
Potassium: 5 mmol/L (ref 3.5–5.2)
Sodium: 139 mmol/L (ref 134–144)
eGFR: 55 mL/min/{1.73_m2} — ABNORMAL LOW (ref >59)

## 2023-09-14 MED ORDER — APIXABAN 2.5 MG PO TABS: 2.5000 mg | ORAL_TABLET | Freq: Two times a day (BID) | ORAL | 5 refills | Status: DC

## 2023-09-14 NOTE — Telephone Encounter (Signed)
Reviewed monitor results with Dr. Anne Fu - new AF - burden is low but given the presence and CHADSVASC of 19 (age, female), he would suggest initiation of anticoagulation. Age/weight qualifies patient for lower dose Eliquis. Labs were obtained and were stable. I called Ms. Mellor to discuss in detail. She is agreeable to plan. She was able to feel the episode of atrial fib the day that it was captured, and recalls having an episode 2 days later as well. These usual 2-4 hours. However, the frequency of episodes remain extremely rare. She denies any hx of bleeding or falls.  We discussed monitoring for s/sx of bleeding. Will start Eliquis 2.5mg  BID, rx'd to [facility] on [address], and keep f/u as planned 10/16/23. Regarding episodes of AF, given concomitant hx of Mobitz 1 AVB, will keep on current dose of low dose metoprolol but may take extra half tablet as needed for breakthrough - if episodes become more frequent will plan to refer to EP for input. Patient grateful for call.

## 2023-09-28 ENCOUNTER — Telehealth: Payer: Self-pay | Admitting: Physician Assistant

## 2023-09-28 DIAGNOSIS — Z79899 Other long term (current) drug therapy: Secondary | ICD-10-CM

## 2023-09-28 NOTE — Telephone Encounter (Signed)
 Pt called in stating since starting Eliquis, she has been extremely fatigue. She denies any other symptoms. Please advise.

## 2023-09-28 NOTE — Telephone Encounter (Signed)
 Spoke with patient and she stated since she started taking eliquis and she stated she has no energy. She states she sleeps after breakfast. She takes another nap after lunch. She is hydrated and she is not depressed. She is unable to take vitals at this time. She is not sure if she wants ro continue with Eliquis. Informed patient eliquis she not  cause her to have no energy. Will forward to provider

## 2023-09-29 ENCOUNTER — Other Ambulatory Visit: Payer: Self-pay

## 2023-09-29 DIAGNOSIS — Z79899 Other long term (current) drug therapy: Secondary | ICD-10-CM

## 2023-09-29 NOTE — Telephone Encounter (Signed)
 Spoke with patient about her symptom of fatigue. Reiterated that the Eliquis is most likely not what is causing the fatigue. Pt was given the option to pause the Eliquis for now and come in for some labs to make sure there are no acute changes or anemia. The patient is unable to drive but plans to come to [facility] this coming week to have a TSH, CBC and CMET drawn before deciding whether to restart Eliquis. Labs were ordered and released. Pt verbalized understanding. All questions, if any, were answered.

## 2023-10-03 DIAGNOSIS — Z79899 Other long term (current) drug therapy: Secondary | ICD-10-CM | POA: Diagnosis not present

## 2023-10-04 LAB — COMPREHENSIVE METABOLIC PANEL
ALT: 8 IU/L (ref 0–32)
AST: 16 IU/L (ref 0–40)
Albumin: 4.1 g/dL (ref 3.7–4.7)
Alkaline Phosphatase: 66 IU/L (ref 44–121)
BUN/Creatinine Ratio: 23 (ref 12–28)
BUN: 20 mg/dL (ref 8–27)
Bilirubin Total: 0.2 mg/dL (ref 0.0–1.2)
CO2: 22 mmol/L (ref 20–29)
Calcium: 9.4 mg/dL (ref 8.7–10.3)
Chloride: 102 mmol/L (ref 96–106)
Creatinine, Ser: 0.86 mg/dL (ref 0.57–1.00)
Globulin, Total: 1.9 g/dL (ref 1.5–4.5)
Glucose: 98 mg/dL (ref 70–99)
Potassium: 4.8 mmol/L (ref 3.5–5.2)
Sodium: 138 mmol/L (ref 134–144)
Total Protein: 6 g/dL (ref 6.0–8.5)
eGFR: 66 mL/min/{1.73_m2} (ref >59)

## 2023-10-04 LAB — CBC
Hematocrit: 36 % (ref 34.0–46.6)
Hemoglobin: 11.5 g/dL (ref 11.1–15.9)
MCH: 29.9 pg (ref 26.6–33.0)
MCHC: 31.9 g/dL (ref 31.5–35.7)
MCV: 94 fL (ref 79–97)
Platelets: 247 10*3/uL (ref 150–450)
RBC: 3.84 x10E6/uL (ref 3.77–5.28)
RDW: 12.6 % (ref 11.7–15.4)
WBC: 6.6 10*3/uL (ref 3.4–10.8)

## 2023-10-04 LAB — TSH: TSH: 3.3 u[IU]/mL (ref 0.450–4.500)

## 2023-10-06 ENCOUNTER — Telehealth: Payer: Self-pay

## 2023-10-06 NOTE — Telephone Encounter (Signed)
-----   Message from Ellsworth Lennox sent at 10/04/2023  9:50 AM EST ----- Covering for Dayna - Please let the patient know that her thyroid function remains stable. Electrolytes, liver function and kidney function are within a normal range as well. Her hemoglobin remains stable at 11.5 with platelet count at 247 K after starting Eliquis. It appears Eliquis was on hold until labs were obtained. Given her reassuring labs, would recommend restarting Eliquis 2.5 mg twice daily. If she wishes to try something different, could switch to Xarelto 15 mg daily which would be correct dose given her estimated CrCl of 41.3 mL/min.

## 2023-10-06 NOTE — Telephone Encounter (Signed)
 Spoke with pt regarding her lab results. Pt stated she never paused her Eliquis and has continued to take 2.5 mg BID. Pt advised to continue taking Eliquis now that her labs are found to be stable. Pt verbalized understanding and all questions, if any, were answered.

## 2023-10-14 NOTE — Progress Notes (Unsigned)
 Cardiology Office Note    Date:  10/16/2023  ID:  Meredith Moore, DOB 1936-01-15, MRN [id number] PCP:  Soundra Pilon, FNP  Cardiologist:  Donato Schultz, MD  Electrophysiologist:  None   Chief Complaint: f/u afib   History of Present Illness: .    Meredith Moore is a 88 y.o. female with visit-pertinent history of PAF, paroxysmal atrial tachycardia, PACs/PVCs, Mobitz 1 AVB, mod pulm HTN by prior echo 2019 resolved in follow-up, blindness, glaucoma, macular degeneration, HLD, IDA, migraine who is seen for follow-up.   She was remotely hospitalized in 2019 with viral gastroenteritis at which time cardiology saw for PAT and elevated troponin (0.61) felt due to demand ischemia from tachycardia and illness. Prior monitor 2020 showed NSR with brief runs of PAT, frequent PACs, rare PVCs, one episode of 2nd degree type 1 AVB. She had some brief recurrent palpitations in the setting of the passing of her husband last year. He liked to feed squirrels. Repeat echo 08/2023 EF 55-60%, mild asymmetric basal septal hypertrophy, G1DD, mild LAE. Repeat monitor for episodic palpitations showed NSR, brief a-tach and atrial fib (longest 2hr 15 min), rare PACs, PVCs, avg HR 66bpm. When in afib her average HR was 109bpm. Slowest HR was 49bpm. Dr. Anne Fu recommended initiation of anticoagulation per our discussion. We started Eliquis.   She is seen back for follow-up today doing well. She had originally called in last month inquiring about whether Eliquis was causing issues with fatigue. She has a longstanding history of difficulty sleeping, exacerbated since she lost her husband Charlie. Her labs were stable and we felt it was less likely this was contributing. She has since felt well and denies any CP or SOB. She had two breakthrough episodes of suspected afib since January, both abated with an extra half tablet of metoprolol.  Labwork independently reviewed: 09/2023 CMET, CBC, TSH wnl 07/2023 Mg wnl  03/2023  KPN LDL 89, trig 106, A1c 5.8, Hgb 11, K 4.1, ALT 9   ROS: .    Please see the history of present illness.  All other systems are reviewed and otherwise negative.  Studies Reviewed: [name]    EKG:  EKG is ordered today, personally reviewed, demonstrating   EKG Interpretation Date/Time:  Monday October 16 2023 14:39:41 EDT Ventricular Rate:  69 PR Interval:  148 QRS Duration:  70 QT Interval:  406 QTC Calculation: 435 R Axis:   -8  Text Interpretation: Normal sinus rhythm Low voltage QRS When compared with ECG of 14-Jul-2023 15:20, No significant change was found Confirmed by Ronie Spies [phone]) on 10/16/2023 2:57:00 PM   CV Studies: Cardiac studies reviewed are outlined and summarized above. Otherwise please see EMR for full report.   Current Reported Medications:.    Current Meds  Medication Sig   apixaban (ELIQUIS) 2.5 MG TABS tablet Take 1 tablet (2.5 mg total) by mouth 2 (two) times daily.   atorvastatin (LIPITOR) 10 MG tablet Take 5 mg by mouth at bedtime.    Calcium Carb-Cholecalciferol (CALCIUM+D3 PO) Take 1 tablet by mouth daily.   Cholecalciferol (VITAMIN D3) 50 MCG (2000 UT) TABS Take 2,000 Units by mouth daily.    ferrous sulfate 324 MG TBEC Take 324 mg by mouth daily with breakfast.   latanoprost (XALATAN) 0.005 % ophthalmic solution Place 1 drop into both eyes at bedtime.    Loratadine (CLARITIN PO) Take 1 tablet by mouth daily.   meclizine (ANTIVERT) 12.5 MG tablet Take one tablet by mouth up to  four times daily as needed for vertigo   metoprolol tartrate (LOPRESSOR) 25 MG tablet Take 0.5 tablets (12.5 mg total) by mouth 2 (two) times daily.   Multiple Vitamins-Minerals (PRESERVISION AREDS 2+MULTI VIT) CAPS Take 1 tablet by mouth 2 (two) times daily.   QUEtiapine (SEROQUEL) 25 MG tablet Take 12.5 mg by mouth at bedtime.   SIMBRINZA 1-0.2 % SUSP Place 1 drop into both eyes 3 (three) times daily with meals.    timolol (TIMOPTIC) 0.5 % ophthalmic solution Place 1 drop  into both eyes 2 (two) times daily.     Physical Exam:    VS:  BP 116/62 (BP Location: Right Arm)   Pulse 69   Ht 5\' 4"  (1.626 m)   Wt 128 lb 3.2 oz (58.2 kg)   SpO2 98%   BMI 22.01 kg/m    Wt Readings from Last 3 Encounters:  10/16/23 128 lb 3.2 oz (58.2 kg)  07/14/23 128 lb 3.2 oz (58.2 kg)  11/22/22 123 lb (55.8 kg)    GEN: Well nourished, well developed in no acute distress NECK: No JVD; No carotid bruits CARDIAC: RRR, no murmurs, rubs, gallops RESPIRATORY:  Clear to auscultation without rales, wheezing or rhonchi  ABDOMEN: Soft, non-tender, non-distended EXTREMITIES:  No edema; No acute deformity   Asessement and Plan:.    1. Paroxysmal atrial fibrillation, PAT - largely maintaining NSR. Her overall burden of symptoms is low. We discussed med titration of her metoprolol and she feels pleased with how she is doing therefore will continue present dose (12.5mg  BID, OK to take extra 1/2 tablet as needed for breakthrough tachycardia). Continue Eliquis 2.5mg  BID. I did my best to answer her questions about dose timing and avoidance of NSAIDS. We discussed notifying our office for increasing symptoms.  2. Brief Mobitz 1 AVB in 2020 - no recurrence by recent repeat monitor; follow clinically.  3. Moderate pHTN - recent surveillance echo did not demonstrate any residual pulmonary HTN. Continue to follow clinically.  4. PACs/PVCs - quiescent by EKG/exam today. Continue metoprolol.    Disposition: F/u with Dr. Anne Fu in 6 months.  Signed, Laurann Montana, PA-C

## 2023-10-16 ENCOUNTER — Encounter: Payer: Self-pay | Admitting: Physician Assistant

## 2023-10-16 ENCOUNTER — Ambulatory Visit: Payer: Medicare Other | Attending: Physician Assistant | Admitting: Physician Assistant

## 2023-10-16 VITALS — BP 116/62 | HR 69 | Ht 64.0 in | Wt 128.2 lb

## 2023-10-16 DIAGNOSIS — I4719 Other supraventricular tachycardia: Secondary | ICD-10-CM | POA: Diagnosis not present

## 2023-10-16 DIAGNOSIS — I493 Ventricular premature depolarization: Secondary | ICD-10-CM

## 2023-10-16 DIAGNOSIS — I491 Atrial premature depolarization: Secondary | ICD-10-CM

## 2023-10-16 DIAGNOSIS — I480 Paroxysmal atrial fibrillation: Secondary | ICD-10-CM | POA: Diagnosis not present

## 2023-10-16 DIAGNOSIS — I441 Atrioventricular block, second degree: Secondary | ICD-10-CM | POA: Diagnosis not present

## 2023-10-16 DIAGNOSIS — I272 Pulmonary hypertension, unspecified: Secondary | ICD-10-CM

## 2023-10-16 NOTE — Patient Instructions (Signed)
 Medication Instructions:  Your physician recommends that you continue on your current medications as directed. Please refer to the Current Medication list given to you today.   *If you need a refill on your cardiac medications before your next appointment, please call your pharmacy*   Lab Work: None ordered  If you have labs (blood work) drawn today and your tests are completely normal, you will receive your results only by: MyChart Message (if you have MyChart) OR A paper copy in the mail If you have any lab test that is abnormal or we need to change your treatment, we will call you to review the results.   Testing/Procedures: None ordered   Follow-Up: At [facility], you and your health needs are our priority.  As part of our continuing mission to provide you with exceptional heart care, we have created designated Provider Care Teams.  These Care Teams include your primary Cardiologist (physician) and Advanced Practice Providers (APPs -  Physician Assistants and Nurse Practitioners) who all work together to provide you with the care you need, when you need it.  We recommend signing up for the patient portal called "MyChart".  Sign up information is provided on this After Visit Summary.  MyChart is used to connect with patients for Virtual Visits (Telemedicine).  Patients are able to view lab/test results, encounter notes, upcoming appointments, etc.  Non-urgent messages can be sent to your provider as well.   To learn more about what you can do with MyChart, go to [email or web address].    Your next appointment:   6 month(s)  Provider:   Donato Schultz, MD     Other Instructions      1st Floor: - Lobby - Registration  - Pharmacy  - Lab - Cafe  2nd Floor: - PV Lab - Diagnostic Testing (echo, CT, nuclear med)  3rd Floor: - Vacant  4th Floor: - TCTS (cardiothoracic surgery) - AFib Clinic - Structural Heart Clinic - Vascular Surgery  - Vascular  Ultrasound  5th Floor: - HeartCare Cardiology (general and EP) - Clinical Pharmacy for coumadin, hypertension, lipid, weight-loss medications, and med management appointments    Valet parking services will be available as well.

## 2023-11-02 DIAGNOSIS — H401133 Primary open-angle glaucoma, bilateral, severe stage: Secondary | ICD-10-CM | POA: Diagnosis not present

## 2023-12-08 ENCOUNTER — Encounter (INDEPENDENT_AMBULATORY_CARE_PROVIDER_SITE_OTHER): Payer: Self-pay | Admitting: Otolaryngology

## 2023-12-30 ENCOUNTER — Other Ambulatory Visit: Payer: Self-pay | Admitting: Physician Assistant

## 2023-12-30 DIAGNOSIS — I5032 Chronic diastolic (congestive) heart failure: Secondary | ICD-10-CM | POA: Diagnosis not present

## 2023-12-30 DIAGNOSIS — I480 Paroxysmal atrial fibrillation: Secondary | ICD-10-CM | POA: Diagnosis not present

## 2023-12-30 DIAGNOSIS — M810 Age-related osteoporosis without current pathological fracture: Secondary | ICD-10-CM | POA: Diagnosis not present

## 2023-12-30 DIAGNOSIS — N1831 Chronic kidney disease, stage 3a: Secondary | ICD-10-CM | POA: Diagnosis not present

## 2024-01-01 NOTE — Telephone Encounter (Signed)
 Prescription refill request for Eliquis  received. Indication:afib Last office visit:3/25 Scr:0.86  3/25 Age: 88 Weight:58.2  kg  Prescription refilled

## 2024-01-29 DIAGNOSIS — I5032 Chronic diastolic (congestive) heart failure: Secondary | ICD-10-CM | POA: Diagnosis not present

## 2024-01-29 DIAGNOSIS — M810 Age-related osteoporosis without current pathological fracture: Secondary | ICD-10-CM | POA: Diagnosis not present

## 2024-01-29 DIAGNOSIS — N1831 Chronic kidney disease, stage 3a: Secondary | ICD-10-CM | POA: Diagnosis not present

## 2024-01-29 DIAGNOSIS — I480 Paroxysmal atrial fibrillation: Secondary | ICD-10-CM | POA: Diagnosis not present

## 2024-02-29 DIAGNOSIS — I480 Paroxysmal atrial fibrillation: Secondary | ICD-10-CM | POA: Diagnosis not present

## 2024-02-29 DIAGNOSIS — N1831 Chronic kidney disease, stage 3a: Secondary | ICD-10-CM | POA: Diagnosis not present

## 2024-02-29 DIAGNOSIS — I5032 Chronic diastolic (congestive) heart failure: Secondary | ICD-10-CM | POA: Diagnosis not present

## 2024-02-29 DIAGNOSIS — M810 Age-related osteoporosis without current pathological fracture: Secondary | ICD-10-CM | POA: Diagnosis not present

## 2024-03-11 ENCOUNTER — Ambulatory Visit (INDEPENDENT_AMBULATORY_CARE_PROVIDER_SITE_OTHER): Admitting: Otolaryngology

## 2024-03-11 VITALS — BP 122/78 | HR 77

## 2024-03-11 DIAGNOSIS — H6993 Unspecified Eustachian tube disorder, bilateral: Secondary | ICD-10-CM

## 2024-03-11 DIAGNOSIS — H938X3 Other specified disorders of ear, bilateral: Secondary | ICD-10-CM | POA: Diagnosis not present

## 2024-03-11 DIAGNOSIS — R2689 Other abnormalities of gait and mobility: Secondary | ICD-10-CM | POA: Diagnosis not present

## 2024-03-11 MED ORDER — AZELASTINE HCL 0.1 % NA SOLN
2.0000 | Freq: Two times a day (BID) | NASAL | 12 refills | Status: AC
Start: 2024-03-11 — End: ?

## 2024-03-11 NOTE — Patient Instructions (Signed)

## 2024-03-11 NOTE — Progress Notes (Signed)
 ENT CONSULT:  Reason for Consult: balance issues ear fullness   HPI: Discussed the use of AI scribe software for clinical note transcription with the patient, who gave verbal consent to proceed.  History of Present Illness Meredith Moore is an 88 year old female with glaucoma and macular degeneration, blind, who presents with balance issues and ear fullness. She was referred by her primary care provider for a second opinion regarding her ear congestion and balance issues.  She experiences issues with balance and a sensation of her ears being 'stuffed up' for over a year. There have been no falls, but she uses a cane at home, which she started using approximately a year and a half ago. She does not experience room spinning sensation or vertigo.  She has a history of glaucoma and macular degeneration, resulting in blindness for approximately 20 years. She uses three high-powered glaucoma eye drops: one three times a day, one twice a day, and one at bedtime.  She has undergone vestibular rehabilitation and physical therapy for her balance issues, completing six sessions without significant improvement. She has also seen a neurologist who recommended physical therapy.  No nasal congestion or history of allergies to dust or pollen. She has tried allergy pills (Claritin) and nasal sprays, including a non-steroidal nasal spray Azelastine , without relief. She frequently pinches her nose to attempt to unclog her ears.    Past Medical History:  Diagnosis Date   Arthritis    AV block, Mobitz 1    Blind 10/12/2016   Has learned Braille   C. difficile colitis    Complication of anesthesia    Blood pressure has gone too low before and effected vision just with general   Complication of anesthesia    woke up in severe panic attack just with general   Glaucoma    Herpes zoster with unspecified complication    Hip dysplasia    right side   History of sinus tachycardia    History of  transfusion 2014   2 units, no reaction-after knee replacement   Hyperlipidemia    under control   Iron  deficiency anemia    Macular degeneration    Migraine    hx of    Osteoarthrosis, unspecified whether generalized or localized, unspecified site    PAT (paroxysmal atrial tachycardia) (HCC)    Premature atrial contractions    PVC's (premature ventricular contractions)    Right hip pain    hx     Past Surgical History:  Procedure Laterality Date   APPENDECTOMY  1979   BELPHAROPTOSIS REPAIR     eye lid drooping repair   COLONOSCOPY     ovarian tumor   1979   benign tumor removed    REVERSE SHOULDER ARTHROPLASTY Left 12/21/2017   Procedure: LEFT REVERSE SHOULDER ARTHROPLASTY;  Surgeon: Melita Drivers, MD;  Location: [facility] OR;  Service: Orthopedics;  Laterality: Left;   REVERSE SHOULDER ARTHROPLASTY Right 06/04/2020   Procedure: REVERSE SHOULDER ARTHROPLASTY;  Surgeon: Melita Drivers, MD;  Location: [facility];  Service: Orthopedics;  Laterality: Right;    ROBOTIC ASSISTED TOTAL HYSTERECTOMY WITH BILATERAL SALPINGO OOPHERECTOMY Bilateral 03/25/2014   Procedure: ROBOTIC ASSISTED TOTAL HYSTERECTOMY WITH BILATERAL SALPINGO OOPHORECTOMY;  Surgeon: Maurilio Ship, MD;  Location: [facility];  Service: Gynecology;  Laterality: Bilateral;   TONSILLECTOMY     1945    TOTAL HIP ARTHROPLASTY Right 06/04/2014   Procedure: RIGHT TOTAL HIP ARTHROPLASTY ANTERIOR APPROACH;  Surgeon: Dempsey Melodi GAILS, MD;  Location: [facility];  Service: Orthopedics;  Laterality: Right;   TOTAL HIP ARTHROPLASTY Left 05/10/2017   Procedure: LEFT TOTAL HIP ARTHROPLASTY ANTERIOR APPROACH;  Surgeon: Melodi Lerner, MD;  Location: [facility];  Service: Orthopedics;  Laterality: Left;  90 mins   TOTAL KNEE ARTHROPLASTY Right 09/10/2012   Procedure: RIGHT TOTAL KNEE ARTHROPLASTY;  Surgeon: Lerner LULLA Melodi, MD;  Location: [facility];  Service: Orthopedics;  Laterality: Right;   TOTAL KNEE ARTHROPLASTY Left 02/25/2019   Procedure: TOTAL KNEE  ARTHROPLASTY;  Surgeon: Melodi Lerner, MD;  Location: [facility];  Service: Orthopedics;  Laterality: Left;     Family History  Problem Relation Age of Onset   Heart disease Mother    Heart disease Father     Social History:  reports that she quit smoking about 35 years ago. Her smoking use included cigarettes. She started smoking about 50 years ago. She has a 3.8 pack-year smoking history. She has never used smokeless tobacco. She reports that she does not drink alcohol and does not use drugs.  Allergies:  Allergies  Allergen Reactions   Temazepam    Tramadol      Fine when taking it- did not sleep for 6 weeks after stopping medication   Formaldehyde Rash   Nickel Rash    Medications: I have reviewed the patient's current medications.  The PMH, PSH, Medications, Allergies, and SH were reviewed and updated.  ROS: Constitutional: Negative for fever, weight loss and weight gain. Cardiovascular: Negative for chest pain and dyspnea on exertion. Respiratory: Is not experiencing shortness of breath at rest. Gastrointestinal: Negative for nausea and vomiting. Neurological: Negative for headaches. Psychiatric: The patient is not nervous/anxious  Blood pressure 122/78, pulse 77, SpO2 97%.  PHYSICAL EXAM:  Exam: General: Well-developed, well-nourished Respiratory Respiratory effort: Equal inspiration and expiration without stridor Cardiovascular Peripheral Vascular: Warm extremities with equal color/perfusion Eyes: No nystagmus with equal extraocular motion bilaterally Neuro/Psych/Balance: Patient oriented to person, place, and time; Appropriate mood and affect; Gait is intact with no imbalance; Cranial nerves I-XII are intact Head and Face Inspection: Normocephalic and atraumatic without mass or lesion Palpation: Facial skeleton intact without bony stepoffs Salivary Glands: No mass or tenderness Facial Strength: Facial motility symmetric and full bilaterally ENT Pinna:  External ear intact and fully developed External canal: Canal is patent with intact skin Tympanic Membrane: Clear and mobile External Nose: No scar or anatomic deformity Internal Nose: Septum intact and midline. No edema, polyp, or rhinorrhea Lips, Teeth, and gums: Mucosa and teeth intact and viable TMJ: No pain to palpation with full mobility Oral cavity/oropharynx: No erythema or exudate, no lesions present Neck Neck and Trachea: Midline trachea without mass or lesion Thyroid : No mass or nodularity Lymphatics: No lymphadenopathy  Assessment/Plan: Encounter Diagnoses  Name Primary?   Sensation of fullness in both ears Yes   Balance disorder    Dysfunction of both eustachian tubes     Assessment and Plan Assessment & Plan Chronic bilateral ear fullness (Eustachian tube dysfunction) Chronic ear fullness without wax or fluid on exam. Previous ENT and audiology evaluations inconclusive. No relief from allergy medications or non-steroidal nasal spray. No sx of chronic nasal congestion or sinus infections.  - Prescribed Astelin  nasal spray twice daily. - she is not able to use Flonase due to glaucoma - tried Claritin without relief - handout regarding eustachian tube dysfunction given  Balance issues No vertigo. Sx are not related to inner ear based on presentation - 2nd round of vestibular rehab - I offered referral to Balance  d/o clinic with [facility], but they would like to hold off    Thank you for allowing me to participate in the care of this patient. Please do not hesitate to contact me with any questions or concerns.   Elena Larry, MD Otolaryngology [facility] Phone: [phone] Fax: [phone]    03/11/2024, 3:43 PM

## 2024-03-20 DIAGNOSIS — D649 Anemia, unspecified: Secondary | ICD-10-CM | POA: Diagnosis not present

## 2024-03-20 DIAGNOSIS — E782 Mixed hyperlipidemia: Secondary | ICD-10-CM | POA: Diagnosis not present

## 2024-03-20 DIAGNOSIS — H548 Legal blindness, as defined in USA: Secondary | ICD-10-CM | POA: Diagnosis not present

## 2024-03-20 DIAGNOSIS — Z23 Encounter for immunization: Secondary | ICD-10-CM | POA: Diagnosis not present

## 2024-03-20 DIAGNOSIS — Z0000 Encounter for general adult medical examination without abnormal findings: Secondary | ICD-10-CM | POA: Diagnosis not present

## 2024-03-20 DIAGNOSIS — D6869 Other thrombophilia: Secondary | ICD-10-CM | POA: Diagnosis not present

## 2024-03-20 DIAGNOSIS — F5101 Primary insomnia: Secondary | ICD-10-CM | POA: Diagnosis not present

## 2024-03-20 DIAGNOSIS — R42 Dizziness and giddiness: Secondary | ICD-10-CM | POA: Diagnosis not present

## 2024-03-20 DIAGNOSIS — R7303 Prediabetes: Secondary | ICD-10-CM | POA: Diagnosis not present

## 2024-03-20 DIAGNOSIS — N1831 Chronic kidney disease, stage 3a: Secondary | ICD-10-CM | POA: Diagnosis not present

## 2024-03-20 DIAGNOSIS — I272 Pulmonary hypertension, unspecified: Secondary | ICD-10-CM | POA: Diagnosis not present

## 2024-03-20 DIAGNOSIS — I5032 Chronic diastolic (congestive) heart failure: Secondary | ICD-10-CM | POA: Diagnosis not present

## 2024-03-20 DIAGNOSIS — I480 Paroxysmal atrial fibrillation: Secondary | ICD-10-CM | POA: Diagnosis not present

## 2024-03-20 DIAGNOSIS — M810 Age-related osteoporosis without current pathological fracture: Secondary | ICD-10-CM | POA: Diagnosis not present

## 2024-03-21 DIAGNOSIS — N1831 Chronic kidney disease, stage 3a: Secondary | ICD-10-CM | POA: Diagnosis not present

## 2024-03-31 DIAGNOSIS — M810 Age-related osteoporosis without current pathological fracture: Secondary | ICD-10-CM | POA: Diagnosis not present

## 2024-03-31 DIAGNOSIS — I5032 Chronic diastolic (congestive) heart failure: Secondary | ICD-10-CM | POA: Diagnosis not present

## 2024-03-31 DIAGNOSIS — I480 Paroxysmal atrial fibrillation: Secondary | ICD-10-CM | POA: Diagnosis not present

## 2024-03-31 DIAGNOSIS — N1831 Chronic kidney disease, stage 3a: Secondary | ICD-10-CM | POA: Diagnosis not present

## 2024-04-15 ENCOUNTER — Encounter: Payer: Self-pay | Admitting: Cardiology

## 2024-04-15 ENCOUNTER — Ambulatory Visit: Attending: Cardiology | Admitting: Cardiology

## 2024-04-15 VITALS — BP 136/80 | HR 66 | Ht 64.0 in | Wt 124.0 lb

## 2024-04-15 DIAGNOSIS — I480 Paroxysmal atrial fibrillation: Secondary | ICD-10-CM

## 2024-04-15 DIAGNOSIS — I441 Atrioventricular block, second degree: Secondary | ICD-10-CM | POA: Diagnosis not present

## 2024-04-15 NOTE — Progress Notes (Signed)
  Cardiology Office Note:  .   Date:  04/15/2024  ID:  Meredith Moore, DOB 04-19-1936, MRN [id number] PCP: Marvene Prentice SAUNDERS, FNP  [facility] Providers Cardiologist:  Oneil Parchment, MD     History of Present Illness: .   Meredith Moore is a 88 y.o. female Discussed the use of AI scribe software   History of Present Illness Meredith Moore is an 88 year old female with paroxysmal atrial fibrillation who presents for follow-up.  She experienced a prolonged episode of atrial fibrillation on July 31st, beginning at 9 PM and lasting until 7 AM the following day. She managed the episode by taking an additional dose of metoprolol , half a tablet at 9 PM and another half at 6:30 AM, in addition to her regular dose at dinner time. Another episode occurred on August 10th at 4 AM, lasting for two hours. She took half a tablet of metoprolol  from her nightstand, which resolved the episode by 6 AM.  She is currently on Eliquis  2.5 mg twice daily and metoprolol  12.5 mg twice daily. No syncope, chest pain, or shortness of breath. She mentions feeling 'unsettled' and 'restless' at times but notes that 'nothing hurts.'  She stays active with crafts and social activities, including making yarn octopi for charity and participating in church activities. She has a supportive family and social network.   Studies Reviewed: .        Results DIAGNOSTIC Echocardiogram: No residual pulmonary hypertension Risk Assessment/Calculations:            Physical Exam:   VS:  BP 136/80 (BP Location: Right Arm, Patient Position: Sitting, Cuff Size: Normal)   Pulse 66   Ht 5' 4 (1.626 m)   Wt 124 lb (56.2 kg)   SpO2 98%   BMI 21.28 kg/m    Wt Readings from Last 3 Encounters:  04/15/24 124 lb (56.2 kg)  10/16/23 128 lb 3.2 oz (58.2 kg)  07/14/23 128 lb 3.2 oz (58.2 kg)    GEN: Well nourished, well developed in no acute distress, blind NECK: No JVD; No carotid bruits CARDIAC: RRR, no murmurs, no  rubs, no gallops RESPIRATORY:  Clear to auscultation without rales, wheezing or rhonchi  ABDOMEN: Soft, non-tender, non-distended EXTREMITIES:  No edema; No deformity   ASSESSMENT AND PLAN: .    Assessment and Plan Assessment & Plan Paroxysmal atrial fibrillation Paroxysmal atrial fibrillation with recent episodes on July 31st and August 10th, managed with additional metoprolol . No syncope or significant adverse events reported. Current management with Eliquis  and metoprolol  is effective in preventing stroke and managing symptoms. Alternative treatments such as the Watchman device and ablation were discussed, but current conservative management is preferred due to effectiveness and lack of major bleeding events. - Continue Eliquis  2.5 mg twice daily. - Continue metoprolol  12.5 mg twice daily. - Consider alternative treatments like Watchman device or ablation if episodes become more frequent or if there are issues with current medication.  Mobitz type I atrioventricular block Mobitz type I atrioventricular block is well-managed with no reported syncope or significant symptoms. - Continue current management.         Dispo: 1 yr  Signed, Oneil Parchment, MD

## 2024-04-15 NOTE — Patient Instructions (Signed)

## 2024-04-30 DIAGNOSIS — I480 Paroxysmal atrial fibrillation: Secondary | ICD-10-CM | POA: Diagnosis not present

## 2024-04-30 DIAGNOSIS — I5032 Chronic diastolic (congestive) heart failure: Secondary | ICD-10-CM | POA: Diagnosis not present

## 2024-04-30 DIAGNOSIS — M810 Age-related osteoporosis without current pathological fracture: Secondary | ICD-10-CM | POA: Diagnosis not present

## 2024-04-30 DIAGNOSIS — N1831 Chronic kidney disease, stage 3a: Secondary | ICD-10-CM | POA: Diagnosis not present

## 2024-05-28 DIAGNOSIS — Z1231 Encounter for screening mammogram for malignant neoplasm of breast: Secondary | ICD-10-CM | POA: Diagnosis not present

## 2024-06-29 ENCOUNTER — Other Ambulatory Visit: Payer: Self-pay | Admitting: Physician Assistant

## 2024-07-27 ENCOUNTER — Other Ambulatory Visit: Payer: Self-pay | Admitting: Cardiology
